# Patient Record
Sex: Male | Born: 1950 | Race: White | Hispanic: No | Marital: Married | Smoking: Former smoker
Health system: Southern US, Community
[De-identification: ages and names within clinical notes are randomized; demographics above are authoritative.]

## PROBLEM LIST (undated history)

## (undated) VITALS — BP 112/78 | HR 68 | Temp 97.9°F | Resp 16 | Ht 65.0 in | Wt 235.0 lb

## (undated) DIAGNOSIS — G43909 Migraine, unspecified, not intractable, without status migrainosus: Secondary | ICD-10-CM

## (undated) DIAGNOSIS — E669 Obesity, unspecified: Secondary | ICD-10-CM

## (undated) DIAGNOSIS — F32A Depression, unspecified: Secondary | ICD-10-CM

## (undated) DIAGNOSIS — M1990 Unspecified osteoarthritis, unspecified site: Secondary | ICD-10-CM

## (undated) DIAGNOSIS — I639 Cerebral infarction, unspecified: Secondary | ICD-10-CM

## (undated) DIAGNOSIS — E1169 Type 2 diabetes mellitus with other specified complication: Secondary | ICD-10-CM

## (undated) DIAGNOSIS — F329 Major depressive disorder, single episode, unspecified: Secondary | ICD-10-CM

## (undated) DIAGNOSIS — G4730 Sleep apnea, unspecified: Secondary | ICD-10-CM

## (undated) DIAGNOSIS — I10 Essential (primary) hypertension: Secondary | ICD-10-CM

## (undated) HISTORY — PX: CHOLECYSTECTOMY: SHX55

## (undated) HISTORY — PX: ROTATOR CUFF REPAIR: SHX139

## (undated) HISTORY — PX: KNEE ARTHROSCOPY: SUR90

## (undated) HISTORY — DX: Cerebral infarction, unspecified: I63.9

---

## 2002-01-29 ENCOUNTER — Encounter: Admission: RE | Admit: 2002-01-29 | Discharge: 2002-01-29 | Payer: Self-pay | Admitting: Occupational Medicine

## 2002-01-29 ENCOUNTER — Encounter: Payer: Self-pay | Admitting: Occupational Medicine

## 2005-02-04 ENCOUNTER — Ambulatory Visit (HOSPITAL_COMMUNITY): Admission: RE | Admit: 2005-02-04 | Discharge: 2005-02-04 | Payer: Self-pay | Admitting: Internal Medicine

## 2006-11-05 ENCOUNTER — Ambulatory Visit (HOSPITAL_COMMUNITY): Admission: RE | Admit: 2006-11-05 | Discharge: 2006-11-05 | Payer: Self-pay | Admitting: Orthopedic Surgery

## 2006-11-20 ENCOUNTER — Ambulatory Visit (HOSPITAL_COMMUNITY): Admission: RE | Admit: 2006-11-20 | Discharge: 2006-11-20 | Payer: Self-pay | Admitting: Orthopedic Surgery

## 2007-01-06 ENCOUNTER — Ambulatory Visit (HOSPITAL_COMMUNITY): Admission: RE | Admit: 2007-01-06 | Discharge: 2007-01-06 | Payer: Self-pay | Admitting: Orthopedic Surgery

## 2008-09-28 ENCOUNTER — Ambulatory Visit (HOSPITAL_COMMUNITY): Admission: RE | Admit: 2008-09-28 | Discharge: 2008-09-28 | Payer: Self-pay | Admitting: Orthopedic Surgery

## 2008-10-19 ENCOUNTER — Ambulatory Visit (HOSPITAL_BASED_OUTPATIENT_CLINIC_OR_DEPARTMENT_OTHER): Admission: RE | Admit: 2008-10-19 | Discharge: 2008-10-19 | Payer: Self-pay | Admitting: Orthopedic Surgery

## 2009-01-10 ENCOUNTER — Encounter: Admission: RE | Admit: 2009-01-10 | Discharge: 2009-03-15 | Payer: Self-pay | Admitting: Orthopedic Surgery

## 2009-02-23 ENCOUNTER — Ambulatory Visit (HOSPITAL_COMMUNITY): Admission: RE | Admit: 2009-02-23 | Discharge: 2009-02-23 | Payer: Self-pay | Admitting: Urology

## 2009-04-22 ENCOUNTER — Emergency Department (HOSPITAL_BASED_OUTPATIENT_CLINIC_OR_DEPARTMENT_OTHER): Admission: EM | Admit: 2009-04-22 | Discharge: 2009-04-22 | Payer: Self-pay | Admitting: Emergency Medicine

## 2009-04-28 ENCOUNTER — Emergency Department (HOSPITAL_BASED_OUTPATIENT_CLINIC_OR_DEPARTMENT_OTHER): Admission: EM | Admit: 2009-04-28 | Discharge: 2009-04-28 | Payer: Self-pay | Admitting: Emergency Medicine

## 2009-04-28 ENCOUNTER — Ambulatory Visit: Payer: Self-pay | Admitting: Diagnostic Radiology

## 2009-12-13 ENCOUNTER — Ambulatory Visit (HOSPITAL_COMMUNITY): Admission: RE | Admit: 2009-12-13 | Discharge: 2009-12-13 | Payer: Self-pay | Admitting: Orthopedic Surgery

## 2009-12-27 ENCOUNTER — Ambulatory Visit (HOSPITAL_BASED_OUTPATIENT_CLINIC_OR_DEPARTMENT_OTHER): Admission: RE | Admit: 2009-12-27 | Discharge: 2009-12-27 | Payer: Self-pay | Admitting: Orthopedic Surgery

## 2010-08-15 ENCOUNTER — Ambulatory Visit (HOSPITAL_COMMUNITY): Admission: RE | Admit: 2010-08-15 | Discharge: 2010-08-15 | Payer: Self-pay | Admitting: Otolaryngology

## 2010-10-18 ENCOUNTER — Inpatient Hospital Stay (HOSPITAL_COMMUNITY)
Admission: EM | Admit: 2010-10-18 | Discharge: 2010-10-20 | Payer: Self-pay | Source: Home / Self Care | Attending: Internal Medicine | Admitting: Internal Medicine

## 2010-10-22 ENCOUNTER — Ambulatory Visit (HOSPITAL_COMMUNITY): Admission: RE | Admit: 2010-10-22 | Payer: Self-pay | Source: Home / Self Care | Admitting: Otolaryngology

## 2010-11-25 ENCOUNTER — Encounter: Payer: Self-pay | Admitting: Otolaryngology

## 2010-11-29 ENCOUNTER — Ambulatory Visit (HOSPITAL_BASED_OUTPATIENT_CLINIC_OR_DEPARTMENT_OTHER)
Admission: RE | Admit: 2010-11-29 | Discharge: 2010-11-29 | Payer: Self-pay | Source: Home / Self Care | Attending: Internal Medicine | Admitting: Internal Medicine

## 2010-12-04 ENCOUNTER — Ambulatory Visit (HOSPITAL_BASED_OUTPATIENT_CLINIC_OR_DEPARTMENT_OTHER): Admission: RE | Admit: 2010-12-04 | Payer: Self-pay | Source: Home / Self Care | Admitting: Internal Medicine

## 2010-12-09 DIAGNOSIS — G4733 Obstructive sleep apnea (adult) (pediatric): Secondary | ICD-10-CM

## 2011-01-14 LAB — URINALYSIS, ROUTINE W REFLEX MICROSCOPIC
Hgb urine dipstick: NEGATIVE
Ketones, ur: NEGATIVE mg/dL
Nitrite: NEGATIVE
Specific Gravity, Urine: 1.029 (ref 1.005–1.030)
Urobilinogen, UA: 2 mg/dL — ABNORMAL HIGH (ref 0.0–1.0)

## 2011-01-14 LAB — CBC
HCT: 43.4 % (ref 39.0–52.0)
MCH: 30.1 pg (ref 26.0–34.0)
MCH: 30.8 pg (ref 26.0–34.0)
MCHC: 34.9 g/dL (ref 30.0–36.0)
MCV: 86.1 fL (ref 78.0–100.0)
MCV: 86.4 fL (ref 78.0–100.0)
RBC: 4.71 MIL/uL (ref 4.22–5.81)
RBC: 5.04 MIL/uL (ref 4.22–5.81)
WBC: 8.5 10*3/uL (ref 4.0–10.5)

## 2011-01-14 LAB — POCT CARDIAC MARKERS
CKMB, poc: <1 ng/mL — ABNORMAL LOW (ref 1.0–8.0)
Myoglobin, poc: 71 ng/mL (ref 12–200)

## 2011-01-14 LAB — DIFFERENTIAL
Basophils Relative: 0 % (ref 0–1)
Eosinophils Absolute: 0.1 10*3/uL (ref 0.0–0.7)
Eosinophils Relative: 1 % (ref 0–5)
Lymphs Abs: 0.9 10*3/uL (ref 0.7–4.0)
Monocytes Absolute: 0.7 10*3/uL (ref 0.1–1.0)
Monocytes Relative: 8 % (ref 3–12)

## 2011-01-14 LAB — LIPID PANEL
Cholesterol: 177 mg/dL (ref 0–200)
HDL: 35 mg/dL — ABNORMAL LOW (ref >39)
LDL Cholesterol: 116 mg/dL — ABNORMAL HIGH (ref 0–99)
VLDL: 26 mg/dL (ref 0–40)

## 2011-01-14 LAB — POCT I-STAT, CHEM 8
Calcium, Ion: 1.04 mmol/L — ABNORMAL LOW (ref 1.12–1.32)
HCT: 44 % (ref 39.0–52.0)
Hemoglobin: 15 g/dL (ref 13.0–17.0)
Potassium: 2.9 mEq/L — ABNORMAL LOW (ref 3.5–5.1)

## 2011-01-14 LAB — COMPREHENSIVE METABOLIC PANEL
Albumin: 3.5 g/dL (ref 3.5–5.2)
Alkaline Phosphatase: 27 U/L — ABNORMAL LOW (ref 39–117)
BUN: 15 mg/dL (ref 6–23)
Creatinine, Ser: 1.18 mg/dL (ref 0.4–1.5)
GFR calc Af Amer: >60 mL/min (ref >60)
Total Protein: 5.8 g/dL — ABNORMAL LOW (ref 6.0–8.3)

## 2011-01-14 LAB — TSH: TSH: 0.431 u[IU]/mL (ref 0.350–4.500)

## 2011-01-14 LAB — CARDIAC PANEL(CRET KIN+CKTOT+MB+TROPI)
Relative Index: INVALID (ref 0.0–2.5)
Troponin I: <0.01 ng/mL (ref 0.00–0.06)

## 2011-01-14 LAB — BASIC METABOLIC PANEL
CO2: 26 mEq/L (ref 19–32)
Chloride: 111 mEq/L (ref 96–112)
GFR calc Af Amer: >60 mL/min (ref >60)
Sodium: 143 mEq/L (ref 135–145)

## 2011-01-23 LAB — POCT I-STAT 4, (NA,K, GLUC, HGB,HCT)
Glucose, Bld: 110 mg/dL — ABNORMAL HIGH (ref 70–99)
Hemoglobin: 14.3 g/dL (ref 13.0–17.0)
Potassium: 3.4 mEq/L — ABNORMAL LOW (ref 3.5–5.1)

## 2011-03-19 NOTE — Op Note (Signed)
NAMEALISTAIR, SENFT                 ACCOUNT NO.:  [id number]   MEDICAL RECORD NO.:  [id number]          PATIENT TYPE:  AMB   LOCATION:  [facility]                         FACILITY:  [facility]   PHYSICIAN:  Ollen Gross, M.D.    DATE OF BIRTH:  1951/06/10   DATE OF PROCEDURE:  10/19/2008  DATE OF DISCHARGE:                               OPERATIVE REPORT   PREOPERATIVE DIAGNOSIS:  Right knee medial meniscal tear.   POSTOPERATIVE DIAGNOSIS:  Right knee medial meniscal tear.   PROCEDURE:  Right knee arthroscopy with meniscal debridement.   SURGEON:  Ollen Gross, M.D.   ASSISTANT:  None.   ANESTHESIA:  General.   ESTIMATED BLOOD LOSS:  Minimal.   DRAINS:  None.   COMPLICATIONS:  None.   CONDITION:  Stable to recovery.   BRIEF CLINICAL NOTE:  Espen is a 60 year old male with a several month  history of significant right knee pain with mechanical symptoms.  Exam  and history suggest a medial meniscal tear which was confirmed by MRI.  He presents now for arthroscopy and debridement.   PROCEDURE IN DETAIL:  After the successful administration of general  anesthetic a tourniquet was placed high on the right thigh and right  lower extremity prepped and draped in the usual sterile fashion.  Standard superomedial and inferolateral incision was made and inflow  cannula passed superomedial and camera passed inferolateral.  Arthroscopic visualization proceeds.  Undersurface of the patella and  trochlea looked normal.  Medial and lateral gutters are visualized and  there were no loose bodies.  Flexion and valgus force was applied to the  knee and the medial compartment was entered.  There was a significant  tear in the body and posterior of the medial meniscus.  The chondral  surfaces looked normal.  Spinal needle was used to localize the  inferomedial portal.  Small incision made and dilator placed.  The  meniscus was debrided back to a stable base with baskets and a 4.2 mm  shaver.  There  was a fair amount of synovitis in the chondral area and  ArthroCare was used to free that.  The ACL appears normal.  Lateral  compartment was entered and it looks normal.  We inspected the rest of  the joint.  There were no other tears, loose bodies or chondral defects.  Arthroscopic equipment was removed from the inferior portals which were  closed  in interrupted 4-0 nylon.  Twenty mL of 0.25% Marcaine with epinephrine  was injected through the inflow cannula and then that was removed and  that portal closed with nylon.  Bulky sterile dressings applied and he  was awakened and transported to recovery in stable condition.      Ollen Gross, M.D.  Electronically Signed     FA/MEDQ  D:  10/19/2008  T:  10/19/2008  Job:  [id number]

## 2011-03-22 NOTE — Op Note (Signed)
NAMERACE, LATOUR                 ACCOUNT NO.:  [id number]   MEDICAL RECORD NO.:  [id number]          PATIENT TYPE:  AMB   LOCATION:  DAY                          FACILITY:  [facility]   PHYSICIAN:  Marlowe Kays, M.D.  DATE OF BIRTH:  June 05, 1951   DATE OF PROCEDURE:  11/20/2006  DATE OF DISCHARGE:                               OPERATIVE REPORT   PREOPERATIVE DIAGNOSES:  1. Degenerative arthritis of acromioclavicular joint.  2. 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  3. 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   POSTOPERATIVE DIAGNOSES:  1. Degenerative arthritis of acromioclavicular joint.  2. Chronic impingement syndrome, no full-thickness rotator cuff      identified.  3. 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   PROCEDURE:  Left shoulder arthroscopy with:  1. Minor labral and rotator cuff debridement.  2. Arthroscopic subacromial and distal clavicle decompression.  3. Exploration of rotator cuff, no full-thickness tear found.   SURGEON:  Marlowe Kays, M.D.   ASSISTANTDruscilla Brownie. Idolina Primer, P.A.-C.   ANESTHESIA:  General.   </   PATHOLOGY AND JUSTIFICATION FOR PROCEDURE:  He had some longstanding  pain in his left shoulder which has become progressively worse, leading  to an MRI on November 05, 2006, which showed the above-mentioned  diagnoses.  He understood ahead of time that we might have to make a  mini-incision if there was a full-thickness rotator cuff tear.   PROCEDURE:  General anesthesia was preceded by interscalene block by  anesthesiologist, prophylactic antibiotics, beach-chair position on the  [place] frame.  Left shoulder girdle was prepped with DuraPrep and draped  into a sterile field.  The anatomy of the shoulder joint was marked out  and posterior soft spot portal, lateral portal and the subacromial space  were all infiltrated with 0.5% Marcaine with adrenaline.  Through a  posterior soft spot  portal, I atraumatically entered the glenohumeral  joint.  There was a little debris in the joint.  There was some minor  labral disruption, but no frank tear.  There also appeared to be some  fraying of the underneath surface of the rotator cuff.  I advanced the  scope between the biceps and subscapularis using a switching stick, made  an anterior incision, placing a metal cannula over the switching stick  and then placing a 4.2 shaver in the joint, debriding down the labrum  and the underneath surface of the rotator cuff, as well as removing some  minor debris in the joint.  I then redirected the scope into the  subacromial space.  He had a flagrant bursitis.  With a 4.2 shaver and  through a lateral portal, I cleaned up most the bursal tissue.  I then  introduced the 90-degree ArthroCare vaporizer, removing soft tissue from  the underneath surface of the acromion and distal clavicle.  The [facility]  joint was quite arthritic on plain x-rays, but he was nontender there  and for this reason, I elected not to do an open distal clavicle  resection.  I then used  a 4-mm oval bur, burring down the underneath  surface of the acromion and distal clavicle and alternated back-and-  forth between the vaporizer, the bur and the 4.2 shaver until we had a  wide decompression as documented with his arm to his side and the arm  abducted with the instrument in place.  I then probe the rotator cuff,  finding at most a small superficial defect, but nothing of any  consequence and certainly nothing that appeared to warrant an open  repair.  I then cleared the joint of all fluid possible.  There was no  bleeding.  The 3 portals and subacromial space were all once again  infiltrated with 0.5% Marcaine with adrenalin and the portals closed  with 4-0 nylon.  Betadine, Adaptic, dry sterile dressing and shoulder  immobilizer were applied.  He tolerated the procedure well and was taken  to the recovery room in stable  condition with no known complications.           ______________________________  Marlowe Kays, M.D.     JA/MEDQ  D:  11/20/2006  T:  11/20/2006  Job:  [id number]

## 2011-08-09 LAB — POCT I-STAT 4, (NA,K, GLUC, HGB,HCT)
HCT: 43 % (ref 39.0–52.0)
Sodium: 139 mEq/L (ref 135–145)

## 2011-12-05 ENCOUNTER — Ambulatory Visit (INDEPENDENT_AMBULATORY_CARE_PROVIDER_SITE_OTHER): Payer: 59 | Admitting: Psychology

## 2011-12-05 DIAGNOSIS — F331 Major depressive disorder, recurrent, moderate: Secondary | ICD-10-CM

## 2011-12-18 ENCOUNTER — Ambulatory Visit (INDEPENDENT_AMBULATORY_CARE_PROVIDER_SITE_OTHER): Payer: 59 | Admitting: Psychology

## 2011-12-18 DIAGNOSIS — F331 Major depressive disorder, recurrent, moderate: Secondary | ICD-10-CM

## 2012-01-02 ENCOUNTER — Ambulatory Visit (INDEPENDENT_AMBULATORY_CARE_PROVIDER_SITE_OTHER): Payer: 59 | Admitting: Psychology

## 2012-01-02 DIAGNOSIS — F331 Major depressive disorder, recurrent, moderate: Secondary | ICD-10-CM

## 2012-01-08 ENCOUNTER — Ambulatory Visit: Payer: 59 | Admitting: Psychology

## 2012-02-09 ENCOUNTER — Ambulatory Visit (INDEPENDENT_AMBULATORY_CARE_PROVIDER_SITE_OTHER): Payer: 59 | Admitting: Family Medicine

## 2012-02-09 VITALS — BP 162/76 | HR 50 | Temp 98.3°F | Resp 18 | Ht 65.0 in | Wt 232.0 lb

## 2012-02-09 DIAGNOSIS — J019 Acute sinusitis, unspecified: Secondary | ICD-10-CM

## 2012-02-09 MED ORDER — AZITHROMYCIN 250 MG PO TABS: ORAL_TABLET | ORAL | Status: AC

## 2012-02-09 NOTE — Progress Notes (Signed)
  Subjective:    Patient ID: Raymond [name], male    DOB: 1951/04/10, 61 y.o.   MRN: [id number]  HPI 61 yo male with sinus complaints. History of migraines, currently undergoing botox therapy for migraines. Friday night started with left sided facial pressure.  Developed nasal drainage and PND.  Slight cough.  No sore throat or fever.  No recent antibiotics.  Uses flonase daily already.    Review of Systems Negative except as per HPI     Objective:   Physical Exam  Constitutional: He appears well-developed. No distress.  HENT:  Right Ear: Tympanic membrane, external ear and ear canal normal. Tympanic membrane is not injected, not scarred, not perforated, not erythematous, not retracted and not bulging.  Left Ear: Tympanic membrane, external ear and ear canal normal. Tympanic membrane is not injected, not scarred, not perforated, not erythematous, not retracted and not bulging.  Nose: Mucosal edema present. No rhinorrhea. Right sinus exhibits no maxillary sinus tenderness and no frontal sinus tenderness. Left sinus exhibits maxillary sinus tenderness. Left sinus exhibits no frontal sinus tenderness.  Mouth/Throat: Uvula is midline, oropharynx is clear and moist and mucous membranes are normal. No oropharyngeal exudate or tonsillar abscesses.  Cardiovascular: Normal rate, regular rhythm, normal heart sounds and intact distal pulses.   No murmur heard. Pulmonary/Chest: Effort normal and breath sounds normal. No respiratory distress. He has no wheezes. He has no rales.  Lymphadenopathy:       Head (right side): No submandibular and no preauricular adenopathy present.       Head (left side): No submandibular and no preauricular adenopathy present.       Right cervical: No superficial cervical and no posterior cervical adenopathy present.      Left cervical: No superficial cervical and no posterior cervical adenopathy present.       Right: No supraclavicular adenopathy present.       Left: No  supraclavicular adenopathy present.  Skin: Skin is warm and dry.          Assessment & Plan:  Sinusitis - zpak.

## 2012-08-31 ENCOUNTER — Other Ambulatory Visit (HOSPITAL_COMMUNITY): Payer: Self-pay | Admitting: Orthopedic Surgery

## 2012-08-31 DIAGNOSIS — M25511 Pain in right shoulder: Secondary | ICD-10-CM

## 2012-09-04 ENCOUNTER — Ambulatory Visit (HOSPITAL_COMMUNITY)
Admission: RE | Admit: 2012-09-04 | Discharge: 2012-09-04 | Disposition: A | Discharge status: Home / Self Care | Payer: 59 | Source: Ambulatory Visit | Attending: Orthopedic Surgery | Admitting: Orthopedic Surgery

## 2012-09-04 DIAGNOSIS — M25511 Pain in right shoulder: Secondary | ICD-10-CM

## 2012-09-04 DIAGNOSIS — M719 Bursopathy, unspecified: Secondary | ICD-10-CM | POA: Insufficient documentation

## 2012-09-04 DIAGNOSIS — M25519 Pain in unspecified shoulder: Secondary | ICD-10-CM | POA: Insufficient documentation

## 2012-09-04 DIAGNOSIS — M67919 Unspecified disorder of synovium and tendon, unspecified shoulder: Secondary | ICD-10-CM | POA: Insufficient documentation

## 2012-10-20 ENCOUNTER — Emergency Department (HOSPITAL_COMMUNITY)
Admission: EM | Admit: 2012-10-20 | Discharge: 2012-10-20 | Disposition: A | Discharge status: Other Acute Inpatient Hospital | Payer: 59 | Attending: Emergency Medicine | Admitting: Emergency Medicine

## 2012-10-20 ENCOUNTER — Inpatient Hospital Stay (HOSPITAL_COMMUNITY)
Admission: RE | Admit: 2012-10-20 | Discharge: 2012-10-27 | DRG: 885 | Disposition: A | Discharge status: Home / Self Care | Payer: 59 | Attending: Psychiatry | Admitting: Psychiatry

## 2012-10-20 ENCOUNTER — Encounter (HOSPITAL_COMMUNITY): Payer: Self-pay | Admitting: *Deleted

## 2012-10-20 DIAGNOSIS — Z888 Allergy status to other drugs, medicaments and biological substances status: Secondary | ICD-10-CM

## 2012-10-20 DIAGNOSIS — Z8679 Personal history of other diseases of the circulatory system: Secondary | ICD-10-CM | POA: Insufficient documentation

## 2012-10-20 DIAGNOSIS — Z79899 Other long term (current) drug therapy: Secondary | ICD-10-CM | POA: Insufficient documentation

## 2012-10-20 DIAGNOSIS — G43909 Migraine, unspecified, not intractable, without status migrainosus: Secondary | ICD-10-CM | POA: Diagnosis present

## 2012-10-20 DIAGNOSIS — G4730 Sleep apnea, unspecified: Secondary | ICD-10-CM | POA: Diagnosis present

## 2012-10-20 DIAGNOSIS — R45851 Suicidal ideations: Secondary | ICD-10-CM | POA: Insufficient documentation

## 2012-10-20 DIAGNOSIS — F3289 Other specified depressive episodes: Secondary | ICD-10-CM | POA: Insufficient documentation

## 2012-10-20 DIAGNOSIS — F329 Major depressive disorder, single episode, unspecified: Secondary | ICD-10-CM | POA: Insufficient documentation

## 2012-10-20 DIAGNOSIS — I10 Essential (primary) hypertension: Secondary | ICD-10-CM | POA: Diagnosis present

## 2012-10-20 DIAGNOSIS — F332 Major depressive disorder, recurrent severe without psychotic features: Principal | ICD-10-CM | POA: Diagnosis present

## 2012-10-20 DIAGNOSIS — M129 Arthropathy, unspecified: Secondary | ICD-10-CM | POA: Diagnosis present

## 2012-10-20 DIAGNOSIS — F339 Major depressive disorder, recurrent, unspecified: Secondary | ICD-10-CM | POA: Diagnosis present

## 2012-10-20 HISTORY — DX: Unspecified osteoarthritis, unspecified site: M19.90

## 2012-10-20 HISTORY — DX: Major depressive disorder, single episode, unspecified: F32.9

## 2012-10-20 HISTORY — DX: Migraine, unspecified, not intractable, without status migrainosus: G43.909

## 2012-10-20 HISTORY — DX: Essential (primary) hypertension: I10

## 2012-10-20 HISTORY — DX: Sleep apnea, unspecified: G47.30

## 2012-10-20 HISTORY — DX: Depression, unspecified: F32.A

## 2012-10-20 LAB — RAPID URINE DRUG SCREEN, HOSP PERFORMED
Amphetamines: NOT DETECTED
Barbiturates: NOT DETECTED
Benzodiazepines: NOT DETECTED
Tetrahydrocannabinol: NOT DETECTED

## 2012-10-20 LAB — CBC WITH DIFFERENTIAL/PLATELET
Basophils Absolute: 0.1 10*3/uL (ref 0.0–0.1)
Basophils Relative: 1 % (ref 0–1)
Eosinophils Absolute: 0.2 10*3/uL (ref 0.0–0.7)
Eosinophils Relative: 1 % (ref 0–5)
HCT: 47.8 % (ref 39.0–52.0)
MCH: 30.7 pg (ref 26.0–34.0)
MCHC: 36 g/dL (ref 30.0–36.0)
MCV: 85.4 fL (ref 78.0–100.0)
Monocytes Absolute: 0.8 10*3/uL (ref 0.1–1.0)
RDW: 13 % (ref 11.5–15.5)

## 2012-10-20 LAB — COMPREHENSIVE METABOLIC PANEL
ALT: 17 U/L (ref 0–53)
Albumin: 4.2 g/dL (ref 3.5–5.2)
Alkaline Phosphatase: 38 U/L — ABNORMAL LOW (ref 39–117)
Chloride: 104 mEq/L (ref 96–112)
Glucose, Bld: 98 mg/dL (ref 70–99)
Potassium: 3.6 mEq/L (ref 3.5–5.1)
Sodium: 139 mEq/L (ref 135–145)
Total Protein: 7.2 g/dL (ref 6.0–8.3)

## 2012-10-20 MED ORDER — HYDROXYZINE HCL 50 MG PO TABS
50.0000 mg | ORAL_TABLET | Freq: Every evening | ORAL | Status: DC | PRN
  Administered 2012-10-21 – 2012-10-26 (×7): 50 mg via ORAL
  Filled 2012-10-20 (×18): qty 1

## 2012-10-20 MED ORDER — NAPROXEN 500 MG PO TABS
500.0000 mg | ORAL_TABLET | Freq: Three times a day (TID) | ORAL | Status: DC
  Filled 2012-10-20 (×2): qty 1

## 2012-10-20 MED ORDER — NAPROXEN 500 MG PO TABS
500.0000 mg | ORAL_TABLET | Freq: Three times a day (TID) | ORAL | Status: DC | PRN
  Administered 2012-10-21 (×2): 500 mg via ORAL
  Filled 2012-10-20: qty 1
  Filled 2012-10-20: qty 12

## 2012-10-20 MED ORDER — VERAPAMIL HCL 120 MG PO TABS
120.0000 mg | ORAL_TABLET | Freq: Every day | ORAL | Status: DC
  Administered 2012-10-21 – 2012-10-27 (×7): 120 mg via ORAL
  Filled 2012-10-20 (×9): qty 1

## 2012-10-20 MED ORDER — ALUM & MAG HYDROXIDE-SIMETH 200-200-20 MG/5ML PO SUSP: 30.0000 mL | ORAL | Status: DC | PRN

## 2012-10-20 MED ORDER — AMOXICILLIN 500 MG PO CAPS
500.0000 mg | ORAL_CAPSULE | Freq: Two times a day (BID) | ORAL | Status: AC
  Administered 2012-10-21 – 2012-10-26 (×12): 500 mg via ORAL
  Filled 2012-10-20 (×13): qty 1
  Filled 2012-10-20: qty 2

## 2012-10-20 MED ORDER — CITALOPRAM HYDROBROMIDE 20 MG PO TABS
20.0000 mg | ORAL_TABLET | Freq: Every day | ORAL | Status: DC
  Administered 2012-10-21: 20 mg via ORAL
  Filled 2012-10-20 (×3): qty 1

## 2012-10-20 MED ORDER — LOSARTAN POTASSIUM 50 MG PO TABS
120.0000 mg | ORAL_TABLET | Freq: Every day | ORAL | Status: DC
  Administered 2012-10-21 – 2012-10-27 (×7): 125 mg via ORAL
  Filled 2012-10-20 (×10): qty 1

## 2012-10-20 MED ORDER — TOPIRAMATE 100 MG PO TABS
100.0000 mg | ORAL_TABLET | Freq: Every day | ORAL | Status: DC
Start: 2012-10-21 — End: 2012-10-27
  Administered 2012-10-21 – 2012-10-26 (×7): 100 mg via ORAL
  Filled 2012-10-20 (×10): qty 1

## 2012-10-20 MED ORDER — ACETAMINOPHEN 325 MG PO TABS: 650.0000 mg | ORAL_TABLET | Freq: Four times a day (QID) | ORAL | Status: DC | PRN

## 2012-10-20 MED ORDER — MAGNESIUM HYDROXIDE 400 MG/5ML PO SUSP: 30.0000 mL | Freq: Every day | ORAL | Status: DC | PRN

## 2012-10-20 MED ORDER — LURASIDONE HCL 20 MG PO TABS
20.0000 mg | ORAL_TABLET | Freq: Every day | ORAL | Status: DC
  Filled 2012-10-20 (×2): qty 1

## 2012-10-20 MED ORDER — DULOXETINE HCL 60 MG PO CPEP
60.0000 mg | ORAL_CAPSULE | Freq: Two times a day (BID) | ORAL | Status: DC
  Administered 2012-10-21: 60 mg via ORAL
  Filled 2012-10-20 (×4): qty 1

## 2012-10-20 NOTE — ED Provider Notes (Signed)
Medical screening examination/treatment/procedure(s) were performed by non-physician practitioner and as supervising physician I was immediately available for consultation/collaboration.  Gerhard Munch, MD 10/20/12 2351

## 2012-10-20 NOTE — ED Notes (Signed)
Per [employer] pt ok to return to [facility]; accepted by Donell Sievert PA for Dr Daleen Bo; report called to Sue Lush on [phone]; Security to transport patient with sitter

## 2012-10-20 NOTE — ED Provider Notes (Signed)
History     CSN: [id number]  Arrival date & time 10/20/12  2055   First MD Initiated Contact with Patient 10/20/12 2109      Chief Complaint  Patient presents with  . Medical Clearance    (Consider location/radiation/quality/duration/timing/severity/associated sxs/prior treatment) HPI Comments: 61 year old male presents to the emergency department with his wife from behavioral health needing medical clearance. Patient has a long history of depression and went to check himself at behavioral health who told him to come get medically cleared prior to being admitted. Patient's depression has been worsening over the past couple weeks. He denies suicidal ideations, however his wife states otherwise. She states he has tried to kill himself in the past and feels he is acting the same. Patient is currently on antibiotics for a sinus infection. He is beginning to feel better. He has a history of hypertension and migraines. Other than depression patient is not complaining of any other symptoms at this time.  The history is provided by the patient and the spouse.    Past Medical History  Diagnosis Date  . Hypertension   . Migraines     Past Surgical History  Procedure Date  . Knee arthroscopy   . Rotator cuff repair     No family history on file.  History  Substance Use Topics  . Smoking status: Never Smoker   . Smokeless tobacco: Not on file  . Alcohol Use: No      Review of Systems  Psychiatric/Behavioral: Positive for suicidal ideas. The patient is nervous/anxious.   All other systems reviewed and are negative.    Allergies  Flomax and Lopressor  Home Medications   Current Outpatient Rx  Name  Route  Sig  Dispense  Refill  . CITALOPRAM HYDROBROMIDE 20 MG PO TABS   Oral   Take 20 mg by mouth daily.         . DULOXETINE HCL 60 MG PO CPEP   Oral   Take 60 mg by mouth daily.         . IBUPROFEN 200 MG PO TABS   Oral   Take 200 mg by mouth every 6 (six) hours  as needed. Pain         . LOSARTAN POTASSIUM 100 MG PO TABS   Oral   Take 120 mg by mouth daily.         Marland Kitchen LURASIDONE HCL 20 MG PO TABS   Oral   Take by mouth.         . TOPIRAMATE 100 MG PO TABS   Oral   Take 100 mg by mouth 2 (two) times daily.          Marland Kitchen VERAPAMIL HCL 120 MG PO TABS   Oral   Take 120 mg by mouth 1 day or 1 dose.           There were no vitals taken for this visit.  Physical Exam  Constitutional: He is oriented to person, place, and time. He appears well-developed. No distress.       Obese  HENT:  Head: Normocephalic and atraumatic.  Mouth/Throat: Oropharynx is clear and moist.  Eyes: Conjunctivae normal and EOM are normal. Pupils are equal, round, and reactive to light.  Neck: Normal range of motion. Neck supple.  Cardiovascular: Normal rate, regular rhythm and normal heart sounds.   Pulmonary/Chest: Effort normal and breath sounds normal.  Abdominal: Soft. Bowel sounds are normal. There is no tenderness.  Musculoskeletal: Normal range  of motion.  Neurological: He is alert and oriented to person, place, and time.  Skin: Skin is warm and dry.  Psychiatric: His speech is normal and behavior is normal. Judgment normal. Cognition and memory are normal. He exhibits a depressed mood. He expresses no homicidal and no suicidal ideation. He expresses no suicidal plans.    ED Course  Procedures (including critical care time)  Labs Reviewed  CBC WITH DIFFERENTIAL - Abnormal; Notable for the following:    WBC 11.5 (*)     Hemoglobin 17.2 (*)     Neutro Abs 8.4 (*)     All other components within normal limits  COMPREHENSIVE METABOLIC PANEL - Abnormal; Notable for the following:    Alkaline Phosphatase 38 (*)     GFR calc non Af Amer 61 (*)     GFR calc Af Amer 70 (*)     All other components within normal limits  ETHANOL  URINE RAPID DRUG SCREEN (HOSP PERFORMED)   No results found.   1. Depression   2. Suicidal ideation       MDM  61  y/o male needing medical clearance before being transferred back to [facility]. Patient is medically cleared. Accepting doctor at [facility] is Dr. Daleen Bo. Patient will be seen by his PA Marland Mcalpine.         Trevor Mace, PA-C 10/20/12 2308

## 2012-10-20 NOTE — ED Notes (Addendum)
Pt sent from [facility] for medical clearance; has been accepted by psychiatrist at [facility]; medical clearance for "clinical depression"; made statement he would be better off dead; sleeping all day; poor appetite; previous history of intentional overdose; family at bedside

## 2012-10-20 NOTE — BH Assessment (Signed)
Assessment Note   Raymond [name] is an 61 y.o. male. Pt presents to [facility] for an evaluation. Pt reports "I am clinically depressed". Pt reports that he has been severely depressed over the past 2-3 days. Pt reports that he retired in 07/2012 and is currently very unhappy with his life. Pt reports decompensation behaviors to include sleeping all day, pt reports that his daily routine is getting out of bed, taking medications, and sleeping for the remainder of the day. Pt reports feeling that he will never be happy. Pt reports feeling hopeless and lacks motivation to do anything. Pt reports poor appetite. Pt presents flat and depressed. Pt reports family conflict in which he could not further specify details. Pt reports that his grandchildren keep him going and states that he wants to be happy for them.   Per wife's collateral feedback, she reports that she is very concerned about patient and feels that he will harm himself due to his current level of depression and previous overdose/suicide attempt in the past. Pt's wife reports that pt made a statement today stating that  life would be better off without him. Wife reports that pt has shut down and is not talking to anyone and does not want to be around anyone and wants to be alone all the time. Pt reports decline in grooming and hygiene. Pt reports that he feels more depressed d/t having migraine headaches everyday. Pt reports that he receives care from a Neurologist and is taking medication for his migraine headaches. Pt reports he is diagnosed with HTN and Sleep Apnea. Pt is obese and reports that he has an unhealthy diet, stating he eats "junk" all the time. Pt unable to function despite outpatient medication management services, pt's wife reports that pt is not consistent and compliant with maintaining outpatient appointments. Pt denies HI and no AVH and is unable to reliably contract for safety at this time. Inpatient treatment recommended for safety and  stabilization.  Consulted with AC Brook McNichol and [employer] who agreed to admit pt to [facility] for inpatient admission for stabilization, medication evaluation,and safety once he is medically cleared at [facility].   Spoke with Charge Nurse Dorathy Daft at [facility] to inform of pt transfer for medical clearance.  Axis I: Major Depression, Recurrent severe Axis II: Deferred Axis III: No past medical history on file. Axis IV: other psychosocial or environmental problems, problems related to social environment and problems with primary support group Axis V: 31-40 impairment in reality testing  Past Medical History: No past medical history on file.  No past surgical history on file.  Family History: No family history on file.  Social History:  reports that he has never smoked. He does not have any smokeless tobacco history on file. He reports that he does not drink alcohol. His drug history not on file.  Additional Social History:  Alcohol / Drug Use Pain Medications:  (None Reported) Prescriptions:  (Yes) Over the Counter:  (OTC Medication for Migraine Headaches) History of alcohol / drug use?: No history of alcohol / drug abuse  CIWA:   COWS:    Allergies:  Allergies  Allergen Reactions  . Lopressor (Metoprolol Tartrate)     Elevated blood pressure    Home Medications:  (Not in a hospital admission)  OB/GYN Status:  No LMP for male patient.  General Assessment Data Location of Assessment: [facility] Assessment Services Living Arrangements: Spouse/significant other (Lives with Spouse and mother-in-law) Can pt return to current living arrangement?:  Yes Admission Status: Voluntary Is patient capable of signing voluntary admission?: Yes Transfer from: Home Referral Source: Self/Family/Friend     Risk to self Suicidal Ideation: No Suicidal Intent: No (per wife,pt stated he would be better off dead today) Is patient at risk for suicide?: Yes Suicidal Plan?: No Access to Means:  No What has been your use of drugs/alcohol within the last 12 months?: none reported Previous Attempts/Gestures: Yes How many times?: 1  Other Self Harm Risks: none reported Triggers for Past Attempts: Family contact;Other (Comment) (unhappy with employer) Intentional Self Injurious Behavior: None Family Suicide History: No Recent stressful life event(s): Conflict (Comment) Persecutory voices/beliefs?: No Depression: Yes Depression Symptoms: Despondent;Insomnia;Tearfulness;Isolating;Fatigue;Loss of interest in usual pleasures;Feeling worthless/self pity Substance abuse history and/or treatment for substance abuse?: No Suicide prevention information given to non-admitted patients: Not applicable  Risk to Others Homicidal Ideation: No Thoughts of Harm to Others: No Current Homicidal Intent: No Current Homicidal Plan: No Access to Homicidal Means: No Identified Victim: na History of harm to others?: No Assessment of Violence: None Noted Violent Behavior Description: No Hx of violence, Calm, Cooperative,flat,depressed Does patient have access to weapons?: No Criminal Charges Pending?: No Does patient have a court date: No  Psychosis Hallucinations: None noted Delusions: None noted  Mental Status Report Appear/Hygiene: Other (Comment) (Appropriate) Eye Contact: Poor Motor Activity: Restlessness Speech: Logical/coherent Level of Consciousness: Alert Mood: Depressed;Ambivalent;Despair;Helpless;Worthless, low self-esteem Affect: Appropriate to circumstance;Depressed Anxiety Level: Minimal Thought Processes: Coherent;Relevant Judgement: Unimpaired Orientation: Person;Place;Time;Situation Obsessive Compulsive Thoughts/Behaviors: None  Cognitive Functioning Concentration: Normal Memory: Recent Intact;Remote Intact IQ: Average Insight: Poor Impulse Control: Fair Appetite: Poor Weight Loss: 0  Weight Gain: 0  Sleep: Increased Total Hours of Sleep:  (pt reports that he  sleeps at least 20 hours a day) Vegetative Symptoms: Staying in bed;Not bathing;Decreased grooming  ADLScreening [facility] Assessment Services) Patient's cognitive ability adequate to safely complete daily activities?: Yes Patient able to express need for assistance with ADLs?: Yes Independently performs ADLs?: Yes (appropriate for developmental age)  Abuse/Neglect [facility]) Physical Abuse: Denies Verbal Abuse: Denies Sexual Abuse: Denies  Prior Inpatient Therapy Prior Inpatient Therapy: Yes Prior Therapy Dates: 2011? Prior Therapy Facilty/Provider(s): [facility] Reason for Treatment: Intentional Overdose, Suicide Attempt  Prior Outpatient Therapy Prior Outpatient Therapy: Yes Prior Therapy Dates: 2013 Prior Therapy Facilty/Provider(s): Does not remember his psychiatrist's name, reports that she is in [place], pt sts he last saw her 2 months ago Reason for Treatment: Medication Management/Depression (Medication Management/Depression)  ADL Screening (condition at time of admission) Patient's cognitive ability adequate to safely complete daily activities?: Yes Patient able to express need for assistance with ADLs?: Yes Independently performs ADLs?: Yes (appropriate for developmental age) Weakness of Legs: None Weakness of Arms/Hands: None       Abuse/Neglect Assessment (Assessment to be complete while patient is alone) Physical Abuse: Denies Verbal Abuse: Denies Sexual Abuse: Denies Self-Neglect: Yes, present (Comment) (reports decrease in Hygiene and Grooming)     Advance Directives (For Healthcare) Advance Directive: Patient does not have advance directive;Patient would not like information Nutrition Screen- [facility] Adult/WL/AP Have you recently lost weight without trying?: No Have you been eating poorly because of a decreased appetite?: Yes Malnutrition Screening Tool Score: 1   Additional Information 1:1 In Past 12 Months?: No CIRT Risk: No Elopement Risk:  No Does patient have medical clearance?: No     Disposition:  Disposition Disposition of Patient: Referred to (Accepted to [facility],pending med clearance at [facility]) Patient referred to:  (Pt referred  to [facility] for med clearance)  On Site Evaluation by:   Reviewed with Physician:     Bjorn Pippin 10/20/2012 8:19 PM

## 2012-10-20 NOTE — ED Notes (Signed)
Pt transported via security with sitter to [facility]

## 2012-10-21 ENCOUNTER — Encounter (HOSPITAL_COMMUNITY): Payer: Self-pay | Admitting: Psychiatry

## 2012-10-21 DIAGNOSIS — F339 Major depressive disorder, recurrent, unspecified: Secondary | ICD-10-CM | POA: Diagnosis present

## 2012-10-21 LAB — LIPID PANEL
LDL Cholesterol: 131 mg/dL — ABNORMAL HIGH (ref 0–99)
VLDL: 31 mg/dL (ref 0–40)

## 2012-10-21 LAB — HEPATIC FUNCTION PANEL
AST: 19 U/L (ref 0–37)
Albumin: 4.1 g/dL (ref 3.5–5.2)
Bilirubin, Direct: 0.1 mg/dL (ref 0.0–0.3)
Total Protein: 6.6 g/dL (ref 6.0–8.3)

## 2012-10-21 LAB — TSH: TSH: 0.635 u[IU]/mL (ref 0.350–4.500)

## 2012-10-21 MED ORDER — HYDROCHLOROTHIAZIDE 12.5 MG PO CAPS
12.5000 mg | ORAL_CAPSULE | Freq: Every day | ORAL | Status: DC
  Administered 2012-10-21 – 2012-10-27 (×7): 12.5 mg via ORAL
  Filled 2012-10-21 (×9): qty 1

## 2012-10-21 MED ORDER — CITALOPRAM HYDROBROMIDE 40 MG PO TABS
40.0000 mg | ORAL_TABLET | Freq: Every day | ORAL | Status: DC
  Administered 2012-10-22 – 2012-10-27 (×6): 40 mg via ORAL
  Filled 2012-10-21 (×8): qty 1

## 2012-10-21 MED ORDER — DULOXETINE HCL 60 MG PO CPEP
60.0000 mg | ORAL_CAPSULE | Freq: Every day | ORAL | Status: DC
  Administered 2012-10-22 – 2012-10-23 (×2): 60 mg via ORAL
  Filled 2012-10-21 (×3): qty 1

## 2012-10-21 NOTE — Progress Notes (Signed)
Pt reports he does not feel any different than when he was admitted last night.  He still looks flat/sad/sullen.  Earlier shift RN reported that pt has been isolating him self today.  He has been encouraged to be up/out of his bed.  He has not attended any groups today.  He is still c/o a headache.  He was given Naproxen 500 mg at this time.  He contracts for safety.  He denies HI/AV.  He voices no needs/concerns at this time.  Pt was encouraged to make his needs known to staff.  Safety maintained with q15 minute checks.

## 2012-10-21 NOTE — Progress Notes (Signed)
[facility] Group Notes:  (Counselor/Nursing/MHT/Case Management/Adjunct)  Type of Therapy:  Psychoeducational Skills  Participation Level:  Did Not Attend  Summary of Progress/Problems: Raymond [name] did not attend psychoeducational group that focused on using quality time with support systems/individuals to engage in health coping skills.   Wandra Scot 10/21/2012 2:36 PM

## 2012-10-21 NOTE — H&P (Signed)
Patient seen and assessed. Agree with above assessment. Discussed with patient simplifying his treatment regimen since he is not benefiting from it currently, he reports being okay with any plan. Will taper Cymbalta, discontinue Latuda and monitor.

## 2012-10-21 NOTE — [facility] Suicide Risk Assessment (Signed)
Suicide Risk Assessment  Admission Assessment     Nursing information obtained from:  Patient Demographic factors:  Male;Caucasian;Unemployed Current Mental Status:  Suicidal ideation indicated by patient;Self-harm thoughts Loss Factors:  Decrease in vocational status;Financial problems / change in socioeconomic status Historical Factors:  Prior suicide attempts;Family history of mental illness or substance abuse Risk Reduction Factors:     CLINICAL FACTORS:   Depression:   Anhedonia Hopelessness Insomnia Severe  COGNITIVE FEATURES THAT CONTRIBUTE TO RISK:  Thought constriction (tunnel vision)    SUICIDE RISK:   Mild:  Suicidal ideation of limited frequency, intensity, duration, and specificity.  There are no identifiable plans, no associated intent, mild dysphoria and related symptoms, good self-control (both objective and subjective assessment), few other risk factors, and identifiable protective factors, including available and accessible social support.  PLAN OF CARE: Initiate medications as appropriate. Encourage patient to attend groups.   Raymond [name] 10/21/2012, 12:03 PM

## 2012-10-21 NOTE — Progress Notes (Signed)
D: Patient cooperative with staff. Patient's affect/mood is irritable and depressed. Declined self inventory sheet. Patient did not attend group.  A: Support and encouragement provided to patient. Administered scheduled medications per MD orders. Monitor Q15 minute checks for safety.  R: Patient compliant with medication regimen. Passive SI but contracts for safety. Denies HI and AVH.

## 2012-10-21 NOTE — Progress Notes (Signed)
Pt reported he has been having daily migraines.

## 2012-10-21 NOTE — Progress Notes (Signed)
Vol admit to the 500 hall presented as a walk-in and was sent for medical clearance.  Pt reports being severely depressed over the past 3-4 days.  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  He says he is very unhappy with life and would just like to go to sleep and not wake up or be locked away from his family.  He feels they would be better off without him.  He states the only thing that keeps him from committing suicide is his 3 grandsons.  He is in constant conflict with his daughter and wife.  He says he sleeps all day, only getting up for his medications.  He tells this Clinical research associate that he does not see any way of him ever being happy again.  He feels hopeless/helpless.  He was able to contract for safety.  He denies HI/AV.  He also spent quite a bit of time venting his frustration about the process for admission and how long it took for him to get to this point.  Pt would vent his frustrations, then apologize for sounding hostile.  Pt reports he has sleep apnea and has a CPAP at home, but it does not work correctly.  He also has HTN and currently is being treated for a sinus infection.  He was cooperative with this Clinical research associate.  Pt was encouraged to make his needs known to staff.  Admission was completed and pt was briefly oriented to the unit/room.  Safety checks q15 minutes initiated.

## 2012-10-21 NOTE — H&P (Signed)
Psychiatric Admission Assessment Adult  Patient Identification:  MANUAL NAVARRA Date of Evaluation:  10/21/2012 Chief Complaint:  MDD History of Present Illness:: Depression for about the past two years, severe in the past few months, retired in September but does not relate it to retirement, depression is continuous, aggravating factors are his family (spouse and daughters), nothing is making it better, Daryle would like to "just go to a corner, lie down, and hide."  He gets upset because his family will not allow him to do this and he acts out angrily.  Randie feels no medicine will make his problems change, "I don't care what you do to me, I really don't."  Migraine headaches stay at a 5/10 on botox for these.  Elements:  Location:  N/A, generalized. Quality:  Severe. Severity:  10/10. Timing:  Consistently "bad all the time". Duration:  2 years, worse over the past few months. Context:  Any time, all the time. Associated Signs/Synptoms: Depression Symptoms:  depressed mood, hypersomnia, fatigue, difficulty concentrating, suicidal thoughts without plan, loss of energy/fatigue, disturbed sleep, decreased appetite, (Hypo) Manic Symptoms:  None Anxiety Symptoms:  None Psychotic Symptoms:  Ringing in the ears, humming noise--constant PTSD Symptoms:  None  Psychiatric Specialty Exam: Physical Exam  Review of Systems  Constitutional: Negative.   Eyes: Negative.   Respiratory: Negative.   Cardiovascular: Negative.   Gastrointestinal: Negative.   Genitourinary: Negative.   Musculoskeletal:       Right shoulder issues  Skin: Negative.   Neurological: Positive for headaches.  Endo/Heme/Allergies: Negative.   Psychiatric/Behavioral: Positive for depression and suicidal ideas.    Blood pressure 143/96, pulse 86, temperature 98.3 F (36.8 C), temperature source Oral, resp. rate 16, height 5\' 5"  (1.651 m), weight 106.595 kg (235 lb).Body mass index is 39.11 kg/(m^2).  General  Appearance: Casual  Eye Contact::  Fair  Speech:  Slow  Volume:  Normal  Mood:  Depressed, Hopeless, Irritable and Worthless  Affect:  Depressed  Thought Process:  Coherent  Orientation:  Full (Time, Place, and Person)  Thought Content:  WDL  Suicidal Thoughts:  No  Homicidal Thoughts:  No  Memory:  Immediate;   Fair Recent;   Fair Remote;   Fair  Judgement:  Fair  Insight:  Fair  Psychomotor Activity:  Decreased  Concentration:  Fair  Recall:  Fair  Akathisia:  No  Handed:  Right  AIMS (if indicated):     Assets:  Communication Skills Social Support  Sleep:  Number of Hours: 5.25     Past Psychiatric History: Diagnosis:  Depression  Hospitalizations:  [facility]  Outpatient Care:  Therapist, psychiatrist  Substance Abuse Care:  Self-Mutilation:  Suicidal Attempts:  Once  Violent Behaviors:     Past Medical History:   Past Medical History  Diagnosis Date  . Hypertension   . Migraines   . Sleep apnea   . Depression   . Arthritis    None. Allergies:   Allergies  Allergen Reactions  . Flomax (Tamsulosin Hcl)     Effects bp   . Lopressor (Metoprolol Tartrate)     Elevated blood pressure   PTA Medications: Prescriptions prior to admission  Medication Sig Dispense Refill  . citalopram (CELEXA) 20 MG tablet Take 20 mg by mouth daily.      . DULoxetine (CYMBALTA) 60 MG capsule Take 60 mg by mouth 2 (two) times daily.       Marland Kitchen ibuprofen (ADVIL,MOTRIN) 200 MG tablet Take 200 mg by mouth  every 6 (six) hours as needed. Pain      . losartan (COZAAR) 100 MG tablet Take 120 mg by mouth daily.      . Lurasidone HCl (LATUDA) 20 MG TABS Take 20 mg by mouth at bedtime.       . topiramate (TOPAMAX) 100 MG tablet Take 100 mg by mouth at bedtime.       . verapamil (CALAN) 120 MG tablet Take 120 mg by mouth daily.        Previous Psychotropic Medications:  Medication/Dose   See MAR               Substance Abuse History in the last 12 months:   no  Consequences of Substance Abuse: NA  Social History:  reports that he has been smoking Cigars.  He does not have any smokeless tobacco history on file. He reports that he does not drink alcohol or use illicit drugs. Additional Social History: Pain Medications: see med list Prescriptions: see med list Over the Counter: see med list History of alcohol / drug use?: No history of alcohol / drug abuse   Current Place of Residence:  [place], [place] Place of Birth:   Family Members:  Wife and two daughters Marital Status:  Married Children:  Sons:  Daughters:  2 Relationships:  Wife Education:  Corporate treasurer Problems/Performance:  None Religious Beliefs/Practices: History of Abuse (Emotional/Physical/Sexual):  None Occupational Experiences;  OR Nurse, retired Hotel manager History:  Data processing manager History:  None Hobbies/Interests:  Nothing at this time brings him pleasure  Family History:  History reviewed. No pertinent family history.  Results for orders placed during the hospital encounter of 10/20/12 (from the past 72 hour(s))  HEPATIC FUNCTION PANEL     Status: Abnormal   Collection Time   10/21/12  6:35 AM      Component Value Range Comment   Total Protein 6.6  6.0 - 8.3 g/dL    Albumin 4.1  3.5 - 5.2 g/dL    AST 19  0 - 37 U/L    ALT 16  0 - 53 U/L    Alkaline Phosphatase 35 (*) 39 - 117 U/L    Total Bilirubin 0.6  0.3 - 1.2 mg/dL    Bilirubin, Direct 0.1  0.0 - 0.3 mg/dL    Indirect Bilirubin 0.5  0.3 - 0.9 mg/dL    Psychological Evaluations:  Assessment:   AXIS I:  Major Depression, Recurrent severe AXIS II:  Deferred AXIS III:   Past Medical History  Diagnosis Date  . Hypertension   . Migraines   . Sleep apnea   . Depression   . Arthritis    AXIS IV:  other psychosocial or environmental problems, problems related to social environment and problems with primary support group AXIS V:  31-40 impairment in reality testing  Treatment Plan/Recommendations:    1-Crisis management and stabilization 2-Medication management to reduce depressive symptoms, return to baseline, improve patient's overall functioning 3-Address hypertension and other physical issues 4-Develop a treatment plan to reduce relapse after discharge 5-Restart home medications as appropriate 6-Psycho-social education regarding relapsing prevention and self-care 7-Individual and group therapy to develop coping skills for depression and family issues  Treatment Plan Summary: Daily contact with patient to assess and evaluate symptoms and progress in treatment Medication management Current Medications:  Current Facility-Administered Medications  Medication Dose Route Frequency Provider Last Rate Last Dose  . acetaminophen (TYLENOL) tablet 650 mg  650 mg Oral Q6H PRN Kerry Hough, PA      .  alum & mag hydroxide-simeth (MAALOX/MYLANTA) 200-200-20 MG/5ML suspension 30 mL  30 mL Oral Q4H PRN Kerry Hough, PA      . amoxicillin (AMOXIL) capsule 500 mg  500 mg Oral Q12H Kerry Hough, PA   500 mg at 10/21/12 1610  . citalopram (CELEXA) tablet 20 mg  20 mg Oral Daily Kerry Hough, PA   20 mg at 10/21/12 9604  . DULoxetine (CYMBALTA) DR capsule 60 mg  60 mg Oral BID Kerry Hough, PA   60 mg at 10/21/12 5409  . hydrochlorothiazide (MICROZIDE) capsule 12.5 mg  12.5 mg Oral Daily Nanine Means, NP      . hydrOXYzine (ATARAX/VISTARIL) tablet 50 mg  50 mg Oral QHS,MR X 1 Kerry Hough, PA   50 mg at 10/21/12 0012  . losartan (COZAAR) tablet 125 mg  125 mg Oral Daily Kerry Hough, PA   125 mg at 10/21/12 0831  . Lurasidone HCl TABS 20 mg  20 mg Oral QHS Kerry Hough, PA      . magnesium hydroxide (MILK OF MAGNESIA) suspension 30 mL  30 mL Oral Daily PRN Kerry Hough, PA      . naproxen (NAPROSYN) tablet 500 mg  500 mg Oral TID PRN Kerry Hough, PA   500 mg at 10/21/12 0012  . topiramate (TOPAMAX) tablet 100 mg  100 mg Oral QHS Kerry Hough, PA   100 mg at 10/21/12  0012  . verapamil (CALAN) tablet 120 mg  120 mg Oral Daily Kerry Hough, PA   120 mg at 10/21/12 8119    Observation Level/Precautions:  15 minute checks  Laboratory:  Completed and reviewed, stable  Psychotherapy:  Individual and group  Medications:  See MAR  Consultations:  None  Discharge Concerns:  None  Estimated LOS:  5-7 days  Other:     I certify that inpatient services furnished can reasonably be expected to improve the patient's condition.   Nanine Means, PMH-NP 12/18/20139:03 AM

## 2012-10-21 NOTE — Clinical Social Work Note (Signed)
[facility] LCSW Aftercare Discharge Planning Group Note       8:30-9:30 AM   10/21/2012 8:21 AM  Participation Quality:  Appropriate  Affect:  Appropriate  Cognitive:  Appropriate  Insight:  Engaged  Engagement in Group:  Engaged  Modes of Intervention:  Education, Exploration, Dentist and Support  Summary of Progress/Problems:  Raymond [name] 10/21/2012, 8:21 AM \

## 2012-10-21 NOTE — Tx Team (Signed)
Initial Interdisciplinary Treatment Plan  PATIENT STRENGTHS: (choose at least two) Ability for insight Average or above average intelligence Capable of independent living General fund of knowledge Work skills  PATIENT STRESSORS: Financial difficulties Marital or family conflict   PROBLEM LIST: Problem List/Patient Goals Date to be addressed Date deferred Reason deferred Estimated date of resolution  Depression- "I want help with my depression"      Risk for self harm-"I just want to sleep and not wake up"      Family/Marital  conflict                                           DISCHARGE CRITERIA:  Ability to meet basic life and health needs Improved stabilization in mood, thinking, and/or behavior Motivation to continue treatment in a less acute level of care Reduction of life-threatening or endangering symptoms to within safe limits Safe-care adequate arrangements made Verbal commitment to aftercare and medication compliance  PRELIMINARY DISCHARGE PLAN: Attend aftercare/continuing care group Attend PHP/IOP Participate in family therapy Placement in alternative living arrangements  PATIENT/FAMIILY INVOLVEMENT: This treatment plan has been presented to and reviewed with the patient, Raymond [name], and/or family member.  The patient and family have been given the opportunity to ask questions and make suggestions.  Jesus Genera [facility] 10/21/2012, 12:42 AM

## 2012-10-21 NOTE — Clinical Social Work Note (Signed)
Interdisciplinary Treatment Plan Update (Adult)  Date:  10/21/2012  Time Reviewed:  8:30 AM   Progress in Treatment: Attending groups:   Yes   Participating in groups:  Yes Taking medication as prescribed:  Yes Tolerating medication:  Yes Family/Significant othe contact made: Contact made with family Patient understands diagnosis:  Yes Discussing patient identified problems/goals with staff: Yes Medical problems stabilized or resolved: Yes Denies suicidal/homicidal ideation:Yes Issues/concerns per patient self-inventory:  Other:   New problem(s) identified:  Reason for Continuation of Hospitalization: Anxiety Depression Medication stabilization Suicidal ideation  Interventions implemented related to continuation of hospitalization:  Medication Management; safety checks q 15 mins  Additional comments:  Estimated length of stay:  2-3 days  Discharge Plan:  Home with outpatient follow up  New goal(s):  Review of initial/current patient goals per problem list:    1.  Goal(s): Eliminate SI/other thoughts of self harm   Met:  Yes  Target date: d/c  As evidenced by: Patient will no longer endorse SI/HI or other thoughts of self harm.    2.  Goal (s):Reduce depression (rated at ten today)  Met: Yes  Target date: d/c  As evidenced by: Patient will rate symptoms at four or below    3.  Goal(s):.stabilize on meds   Met:  No  Target date: d/c  As evidenced by: Patient will report being stabilized on medications - less symptomatic    4.  Goal(s): Refer for outpatient follow up   Met:  No  Target date: d/c  As evidenced by: Follow up appointment scheduled    Attendees: Patient:   10/21/2012 8:30 AM  Physican:   10/21/2012 8:30 AM  Nursing:  Berneice Heinrich, RN 10/21/2012 8:30 AM   Nursing:   Harold Barban, RN 10/21/2012 8:30 AM   Clinical Social Worker:  Juline Patch, LCSW 10/21/2012 8:30 AM   Other: Tera Helper, NP 10/21/2012 8:30 AM   Other:   Patton Salles, Clinical Social Worker, LCSW  10/21/2012 8:30 AM Other: Leighton Parody, Rn     10/21/2012 8:30 AM  Interdisciplinary Treatment Plan Update (Adult)  Date:  10/21/2012  Time Reviewed:  9:45 AM   Progress in Treatment: Attending groups:   Yes   Participating in groups:  Yes Taking medication as prescribed:  Yes Tolerating medication:  Yes Family/Significant othe contact made: Contact made with family Patient understands diagnosis:  Yes Discussing patient identified problems/goals with staff: Yes Medical problems stabilized or resolved: Yes Denies suicidal/homicidal ideation:Yes Issues/concerns per patient self-inventory:  Other:   New problem(s) identified:  Reason for Continuation of Hospitalization: Anxiety Depression Medication stabilization Suicidal ideation  Interventions implemented related to continuation of hospitalization:  Medication Management; safety checks q 15 mins  Additional comments:  Estimated length of stay:  2-3 days  Discharge Plan:  Home with outpatient follow up  New goal(s):  Review of initial/current patient goals per problem list:    1.  Goal(s): Eliminate SI/other thoughts of self harm   Met:  No  Target date: d/c  As evidenced by: Patient no longer endorsing SI/HI or other thoughts of self harm.    2.  Goal (s):Reduce depression/anxiety  Met: No  Target date: d/c  As evidenced by: Patient currently rating symptoms at four or below    3.  Goal(s):.stabilize on meds   Met:  No  Target date: d/c  As evidenced by: Patient reports being stabilized on medications - less symptomatic    4.  Goal(s): Refer for outpatient follow up  Met:  No  Target date: d/c  As evidenced by: Follow up appointment scheduled    Attendees: Patient:   10/21/2012 9:45 AM  Physican:  Patrick North, MD 10/21/2012 9:45 AM  Nursing:  Neill Loft, RN 10/21/2012 9:45 AM   Nursing:   Quintella Reichert, RN 10/21/2012 9:45 AM    Clinical Social Worker:  Juline Patch, LCSW 10/21/2012 9:45 AM   Other: Serena Colonel, FNP 10/21/2012 9:45 AM   Other:  Patton Salles, Clinical Social Worker, LCSW  10/21/2012 9:45 AM Other:        10/21/2012 9:45 AM

## 2012-10-21 NOTE — Progress Notes (Signed)
Psychoeducational Group Note  Date:  10/21/2012 Time:  2015  Group Topic/Focus:  Wrap-Up Group:   The focus of this group is to help patients review their daily goal of treatment and discuss progress on daily workbooks.  Participation Level:  Did Not Attend  Participation Quality:  N/A Did not attend  Affect:    Cognitive:    Insight:    Engagement in Group:    Additional Comments: Did not attend. Patient was in bed, awake and asleep, for the duration of wrap-up group tonight.  Danira Nylander, Newton Pigg 10/21/2012, 10:13 PM

## 2012-10-21 NOTE — Progress Notes (Signed)
[facility] LCSW Group Therapy      Emotional Regulation 1:15 - 2:30 PM           10/21/2012 2:16 PM  Type of Therapy:  Group Therapy  Participation Level:  Did Not Attend  Participation Quality:    Affect:    Cognitive:    Insight:    Engagement in Therapy:    Modes of Intervention:    Summary of Progress/Problems:  [name] 10/21/2012, 2:16 PM

## 2012-10-22 DIAGNOSIS — F339 Major depressive disorder, recurrent, unspecified: Secondary | ICD-10-CM

## 2012-10-22 NOTE — Progress Notes (Signed)
Psychoeducational Group Note  Date:  10/22/2012 Time:  1100   Group Topic/Focus:  Healthy Communication:   The focus of this group is to discuss communication, barriers to communication, as well as healthy ways to communicate with others.  Participation Level: Did Not Attend  Participation Quality:  Not Applicable  Affect:  Not Applicable  Cognitive:  Not Applicable  Insight:  Not Applicable  Engagement in Group: Not Applicable  Additional Comments:    Noah Charon 10/22/2012, 12:04 PM

## 2012-10-22 NOTE — [facility] Counselor (Signed)
Adult Comprehensive Assessment  Patient ID: Raymond [name], male   DOB: [birth year], 61 y.o.   MRN: [id number]  Information Source: Information source: Patient  Current Stressors:  Educational / Learning stressors: None Employment / Job issues: None - P atient retired September 2013 Family Relationships: Patient reports living with stronged willed people who are always right and he is always wrong Surveyor, quantity / Lack of resources (include bankruptcy): Finances are okay but not great Housing / Lack of housing: None Physical health (include injuries & life threatening diseases): HTN and Migraines Social relationships: Patient reports  not having any social relationships Substance abuse: Denies Bereavement / Loss: Denies  Living/Environment/Situation:  Living Arrangements: Spouse/significant other Living conditions (as described by patient or guardian): No love  - boring How long has patient lived in current situation?: 15 years What is atmosphere in current home: Other (Comment) (Not loving)  Family History:  Marital status: Married Number of Years Married: 39  What types of issues is patient dealing with in the relationship?: Feelings of being unheard and unloved - no support Additional relationship information: Patient has no plans to leave the relationship and does not believe change is possible Does patient have children?: Yes How many children?: 2  How is patient's relationship with their children?: For the most part, good relationshi  Childhood History:  By whom was/is the patient raised?: Mother Additional childhood history information: Father was alcoholic and not involved in his life Description of patient's relationship with caregiver when they were a child: Okay.  Mother worked hard and did the best she could. Patient's description of current relationship with people who raised him/her: Okay but does not spend much time with mother Does patient have siblings?: Yes Number of  Siblings: 2  Description of patient's current relationship with siblings: Okay  Did patient suffer any verbal/emotional/physical/sexual abuse as a child?: No Did patient suffer from severe childhood neglect?: No Has patient ever been sexually abused/assaulted/raped as an adolescent or adult?: No Witnessed domestic violence?: No Has patient been effected by domestic violence as an adult?: No  Education:  Highest grade of school patient has completed: Two years of college Currently a student?: No Learning disability?: No  Employment/Work Situation:   Employment situation: Unemployed (Patient retired September 2013) What is the longest time patient has a held a job?: 20 Years 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 [employer] Has patient ever been in the [employer]?: Yes (Describe in comment) (Patient served in Licensed conveyancer) Has patient ever served in combat?: Yes Patient description of combat service: [place]  Financial Resources:   Surveyor, quantity resources:  (Retirement) Does patient have a Lawyer or guardian?: No  Alcohol/Substance Abuse:   What has been your use of drugs/alcohol within the last 12 months?: Patient denies If attempted suicide, did drugs/alcohol play a role in this?: No Alcohol/Substance Abuse Treatment Hx: Denies past history Has alcohol/substance abuse ever caused legal problems?: No  Social Support System:   Forensic psychologist System: None Type of faith/religion: None How does patient's faith help to cope with current illness?: N/A  Leisure/Recreation:   Leisure and Hobbies: Enjoys sports activities with grandsons  Strengths/Needs:   What things does the patient do well?: Unable to identify anything he does well In what areas does patient struggle / problems for patient: Children  Discharge Plan:   Does patient have access to transportation?: Yes Will patient be returning to same living situation after discharge?:  Yes Currently receiving community  mental health services: No If no, would patient like referral for services when discharged?: Yes (What county?) [facility] sales representative) Does patient have financial barriers related to discharge medications?: No  Summary/Recommendations:  Orenthal Debski is a 61 years old Caucasian male who admitted with Major Depression Disorder.  He will benefit from crisis stabilization, evaluation for medication, psycho-education groups for coping skills development, group therapy and case management for discharge planning.     Kalya Troeger, Joesph July. 10/22/2012

## 2012-10-22 NOTE — Progress Notes (Signed)
[facility] LCSW Aftercare Discharge Planning Group Note       8:30-9:30 AM   10/22/2012 10:03 AM  Participation Quality:  Patient did not attend group  Affect:    Cognitive:    Insight:    Engagement in Group:    Modes of Intervention:    Summary of Progress/Problems:  [name] 10/22/2012, 10:03 AM

## 2012-10-22 NOTE — Progress Notes (Signed)
Wife, [name], called this evening inquiring at to pt status. Pt was asked and pt gave verbal permission for wife to have code number and to let this staff member tell wife that he was doing ok but did not wish to speak to her and no other information was released

## 2012-10-22 NOTE — Clinical Social Work Note (Signed)
Writer met with patient to complete PSA.  He endorses depression at six and hopelessness/helplessness at ten.  Patient denies SI.  Patient shared he is not here to get better but as a refuge from his environment.  He advised that he that he has been married for 39 years and that in his home everyone is right except him and he is expected to acknowledge their being right and him being wrong.  He will need referral for outpatient services.  Patient has home and transportation.

## 2012-10-22 NOTE — Clinical Social Work Note (Signed)
[facility] LCSW Group Therapy      Living a Balanced Life  1:15-2:30             10/22/2012 3:46 PM   Type of Therapy:  Group Therapy  Participation Level:  Did not attend  Participation Quality:    Affect:   Cognitive:    Insight:    Engagement in Therapy:   Modes of Intervention:  Discussion Exploration Problem-Solving Supportive Education  Summary of Progress/Problems:  [name] 10/22/2012 3:48 PM [facility] LCSW Group Therapy            10/22/2012 3:49 PM    Type of Therapy:  Group Therapy  Participation Level:  Appropriate  Participation Quality:  Appropriate  Affect:  Appropriate  Cognitive:  Attentive Appropriate  Insight:  Engaged  Engagement in Therapy:  Engaged  Modes of Intervention:  Discussion Exploration Problem-Solving Supportive  Summary of Progress/Problems:  [name] 10/22/2012 3:49 PM

## 2012-10-22 NOTE — Progress Notes (Signed)
[facility] INPATIENT:  Family/Significant Other Suicide Prevention Education  Suicide Prevention Education:  Patient Refusal for Family/Significant Other Suicide Prevention Education: The patient Raymond [name] has refused to provide written consent for family/significant other to be provided Family/Significant Other Suicide Prevention Education during admission and/or prior to discharge.  Physician notified.  [name] 10/22/2012, 12:31 PM

## 2012-10-22 NOTE — Progress Notes (Signed)
[facility] MD Progress Note  10/22/2012 10:13 AM Raymond [name]  MRN:  [id number]  Subjective: "I came here because I'm depressed. My depression rate is currently at #6. I have a lot going on in my life in general. It got to a point where by I was sleeping day and night. That is why I came into the hospital".  Diagnosis:   Axis I: Major depressive disorder, recurrent Axis II: Deferred Axis III:  Past Medical History  Diagnosis Date  . Hypertension   . Migraines   . Sleep apnea   . Depression   . Arthritis    Axis IV: other psychosocial or environmental problems Axis V: 41-50 serious symptoms  ADL's:  Intact  Sleep: Good  Appetite:  Good  Suicidal Ideation:  Denies Homicidal Ideation:  Denies   AEB (as evidenced by): Per patient reports.  Psychiatric Specialty Exam: Review of Systems  Constitutional: Negative.   HENT: Negative.   Eyes: Negative.   Respiratory: Negative.   Cardiovascular: Negative.   Gastrointestinal: Negative.   Genitourinary: Negative.   Musculoskeletal: Negative.   Psychiatric/Behavioral: Positive for depression (Currently being stabilized with medication.). Negative for suicidal ideas and hallucinations. The patient has insomnia (Currently on medication to assisit with sleep.). The patient is not nervous/anxious.     Blood pressure 130/81, pulse 80, temperature 98.2 F (36.8 C), temperature source Oral, resp. rate 16, height 5\' 5"  (1.651 m), weight 106.595 kg (235 lb).Body mass index is 39.11 kg/(m^2).  General Appearance: Casual  Eye Contact::  Good  Speech:  Clear and Coherent  Volume:  Decreased  Mood:  Depressed  Affect:  Flat  Thought Process:  Coherent and Intact  Orientation:  Full (Time, Place, and Person)  Thought Content:  Rumination  Suicidal Thoughts:  No  Homicidal Thoughts:  No  Memory:  Immediate;   Good Recent;   Good Remote;   Good  Judgement:  Fair  Insight:  Fair  Psychomotor Activity:  Normal  Concentration:  Fair   Recall:  Good  Akathisia:  No  Handed:  Right  AIMS (if indicated):     Assets:  Communication Skills Desire for Improvement  Sleep:  Number of Hours: 6.75    Current Medications: Current Facility-Administered Medications  Medication Dose Route Frequency Provider Last Rate Last Dose  . acetaminophen (TYLENOL) tablet 650 mg  650 mg Oral Q6H PRN Kerry Hough, PA      . alum & mag hydroxide-simeth (MAALOX/MYLANTA) 200-200-20 MG/5ML suspension 30 mL  30 mL Oral Q4H PRN Kerry Hough, PA      . amoxicillin (AMOXIL) capsule 500 mg  500 mg Oral Q12H Kerry Hough, PA   500 mg at 10/22/12 0806  . citalopram (CELEXA) tablet 40 mg  40 mg Oral Daily Himabindu Ravi, MD   40 mg at 10/22/12 0811  . DULoxetine (CYMBALTA) DR capsule 60 mg  60 mg Oral Daily Himabindu Ravi, MD   60 mg at 10/22/12 0811  . hydrochlorothiazide (MICROZIDE) capsule 12.5 mg  12.5 mg Oral Daily Nanine Means, NP   12.5 mg at 10/22/12 0810  . hydrOXYzine (ATARAX/VISTARIL) tablet 50 mg  50 mg Oral QHS,MR X 1 Kerry Hough, PA   50 mg at 10/21/12 2156  . losartan (COZAAR) tablet 125 mg  125 mg Oral Daily Kerry Hough, PA   125 mg at 10/22/12 5284  . magnesium hydroxide (MILK OF MAGNESIA) suspension 30 mL  30 mL Oral Daily PRN Kerry Hough,  PA      . naproxen (NAPROSYN) tablet 500 mg  500 mg Oral TID PRN Kerry Hough, PA   500 mg at 10/21/12 1950  . topiramate (TOPAMAX) tablet 100 mg  100 mg Oral QHS Kerry Hough, PA   100 mg at 10/21/12 2156  . verapamil (CALAN) tablet 120 mg  120 mg Oral Daily Kerry Hough, PA   120 mg at 10/22/12 1610    Lab Results:  Results for orders placed during the hospital encounter of 10/20/12 (from the past 48 hour(s))  TSH     Status: Normal   Collection Time   10/21/12  6:35 AM      Component Value Range Comment   TSH 0.635  0.350 - 4.500 uIU/mL   HEMOGLOBIN A1C     Status: Abnormal   Collection Time   10/21/12  6:35 AM      Component Value Range Comment   Hemoglobin A1C  5.7 (*) <5.7 %    Mean Plasma Glucose 117 (*) <117 mg/dL   LIPID PANEL     Status: Abnormal   Collection Time   10/21/12  6:35 AM      Component Value Range Comment   Cholesterol 207 (*) 0 - 200 mg/dL    Triglycerides 960 (*) <150 mg/dL    HDL 45  >45 mg/dL    Total CHOL/HDL Ratio 4.6      VLDL 31  0 - 40 mg/dL    LDL Cholesterol 409 (*) 0 - 99 mg/dL   HEPATIC FUNCTION PANEL     Status: Abnormal   Collection Time   10/21/12  6:35 AM      Component Value Range Comment   Total Protein 6.6  6.0 - 8.3 g/dL    Albumin 4.1  3.5 - 5.2 g/dL    AST 19  0 - 37 U/L    ALT 16  0 - 53 U/L    Alkaline Phosphatase 35 (*) 39 - 117 U/L    Total Bilirubin 0.6  0.3 - 1.2 mg/dL    Bilirubin, Direct 0.1  0.0 - 0.3 mg/dL    Indirect Bilirubin 0.5  0.3 - 0.9 mg/dL     Physical Findings: AIMS: Facial and Oral Movements Muscles of Facial Expression: None, normal Lips and Perioral Area: None, normal Jaw: None, normal Tongue: None, normal,Extremity Movements Upper (arms, wrists, hands, fingers): None, normal Lower (legs, knees, ankles, toes): None, normal, Trunk Movements Neck, shoulders, hips: None, normal, Overall Severity Severity of abnormal movements (highest score from questions above): None, normal Incapacitation due to abnormal movements: None, normal Patient's awareness of abnormal movements (rate only patient's report): No Awareness, Dental Status Current problems with teeth and/or dentures?: No Does patient usually wear dentures?: No  CIWA:    COWS:     Treatment Plan Summary: Daily contact with patient to assess and evaluate symptoms and progress in treatment Medication management  Plan: No new changes made on the current treatment plan. Encouraged group session participation. Continue current treatment plan.  Medical Decision Making Problem Points:  Established problem, stable/improving (1), Review of last therapy session (1) and Review of psycho-social stressors (1) Data  Points:  Review and summation of old records (2) Review of medication regiment & side effects (2)  I certify that inpatient services furnished can reasonably be expected to improve the patient's condition.   Armandina Stammer I 10/22/2012, 10:13 AM

## 2012-10-22 NOTE — Progress Notes (Signed)
Psychoeducational Group Note  Date:  10/22/2012 Time: 1000  Group Topic/Focus:  Self Esteem Action Plan:   The focus of this group is to help patients create a plan to continue to build self-esteem after discharge.  Participation Level:  None  Participation Quality:  Did not share  Affect:  Did not share  Cognitive:  Did not share  Insight:  None  Engagement in Group:  Did not share  Additional Comments:  Patient attended group and did not share. Patient was asked to share on what self esteem was like in his life. Patient was given a self esteem action plan and asked to complete worksheets. Patient reviewed form and did not speak when asked to speak.  Karleen Hampshire Brittini 10/22/2012, 10:51 AM

## 2012-10-22 NOTE — Progress Notes (Signed)
D: Patient appropriate and cooperative with staff. Patient's mood/affect is depressed and irritable, but has improved prior to previous shift. Declined self inventory sheet. Patient did not attend groups. Patient continues to isolate himself in his room away from others.  A: Scheduled medications administered per MD orders. Support and encouragement provided to patient. Maintain Q15 minute observations for safety.  R: Patient compliant with medication regimen. Passive SI, but contracts for safety. Denies HI/AVH. Patient remains safe.

## 2012-10-23 MED ORDER — DULOXETINE HCL 30 MG PO CPEP
30.0000 mg | ORAL_CAPSULE | Freq: Every day | ORAL | Status: DC
  Administered 2012-10-24 – 2012-10-27 (×4): 30 mg via ORAL
  Filled 2012-10-23 (×6): qty 1

## 2012-10-23 NOTE — Progress Notes (Signed)
D - Patient calm and cooperative with staff. Patient continues to isolate himself in his room away from others. Patient did not attend group tonight. Patient remains depressed and at times irritable. Patient rates depression "7/8" and hopelessness "7/8." Patient denies SI/HI or hallucinations.  A - Encouragement and support offered through therapeutic conversations. Medications given as ordered. Encouraged patient to speak with staff about any concerns or questions.  R - Patient safety maintained with Q 15 minute checks. Will continue to monitor patient.

## 2012-10-23 NOTE — Progress Notes (Deleted)
Psychoeducational Group Note  Date:  10/23/2012 Time:  2000  Group Topic/Focus:  Goals Group:   The focus of this group is to help patients establish daily goals to achieve during treatment and discuss how the patient can incorporate goal setting into their daily lives to aide in recovery.  Participation Level:  Active  Participation Quality:  Appropriate  Affect:  Defensive  Cognitive:  Appropriate  Insight:  Developing/Improving  Engagement in Group:  Developing/Improving  Additional Comments:  Patient stated that she had a good day. Patient shared that he got along with everyone, socialized, and went to all the groups. From the groups, patient has learned that he "need to go to grief counseling" upon discharge. Patient hopes to get in better spirits and learn more positive coping skills.   Lyndee Hensen 10/23/2012, 9:53 PM

## 2012-10-23 NOTE — Progress Notes (Signed)
Writer spoke with patient 1:1 and he received his scheduled 2000 medication. Patient was encouraged to attend group and he declined. Writer spoke with patient after group and patient reports that his problem is not going to get any better and he needed to come here and get some rest and he will return home to the same problem. Patient avoids eye contact and talks very negative concerning him home situation. Patient currently denies si/hi/a/v hallucinations. Support and encouragement offered but patient remains hopeless. Safety maintained on unit with 15 min checks, will continue to  monitor.

## 2012-10-23 NOTE — Progress Notes (Signed)
Psychoeducational Group Note  Date:  10/23/2012 Time:  2000  Group Topic/Focus:  Karaoke night   Participation Level:  Did Not Attend  Participation Quality:    Affect:    Cognitive:    Insight:    Engagement in Group:    Additional Comments:  Pt didn't attend karaoke group this evening.  Boden Stucky A 10/23/2012, 3:44 AM

## 2012-10-23 NOTE — Progress Notes (Signed)
Psychoeducational Group Note  Date:  10/23/2012 Time:  1000  Group Topic/Focus:  Wellness Toolbox:   The focus of this group is to discuss various aspects of wellness, balancing those aspects and exploring ways to increase the ability to experience wellness.  Patients will create a wellness toolbox for use upon discharge.  Participation Level:  Active  Participation Quality:  Appropriate, Sharing and Supportive  Affect:  Appropriate  Cognitive:  Appropriate  Insight:  Supportive  Engagement in Group:  Supportive  Additional Comments:  none  Adi Seales M 10/23/2012, 2:02 PM  

## 2012-10-23 NOTE — Progress Notes (Addendum)
Pt is very withdrawn and lying in his bed refusing to go to groups. 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 He also stated he chose to come here to get away from family. Pt has no eye contact and at times can be somewhat arrogant. He stated he has been married times 39 years and the marriage is failing. Pt did say counseling is not an option.He has two children in their 30's that he says he has an okay relationship with. Pt denies SI or HI and contracts for safety. When asked if he had ever tried to kill himself he would not respond. Pt stated he would like to be left alone and would prefer not being checked on. Informed pt our goal is to make him safe and q15 minute checks would continue. Pt encouraged to get out of bed and attend groups.His response- I do not care to ,"just leave me alone please."Will continue to monitor very closely. MD. Dr Daleen Bo made aware. Pt has stayed in bed the majority of the day.he did go to [employer] for lunch and dinner.Pt has remained in his room most of the day with very little eye contact. Pt did take a shower after nurse made multiple request and changed his clothes that nurse obtained from the clothing closet. Pt s clothes were placed in the wash. Pt . Is very isolative and does not want to be bothered. He has not attended any groups and states he does not plan to go to any groups. Pt denies SI or HI however he appears extremely depressed and warrants frequent observation. MD aware- Dr Daleen Bo. Safety is maintained with q 15 minute checks .

## 2012-10-23 NOTE — Clinical Social Work Note (Addendum)
[facility] LCSW Group Therapy  10/23/2012 6:43 PM  Type of Therapy:  Group Therapy  Participation Level:  Did not attend  Clide Dales 10/23/2012, 6:19 PM

## 2012-10-23 NOTE — Progress Notes (Signed)
Psychoeducational Group Note  Date:  10/23/2012 Time:  2000  Group Topic/Focus:  Goals Group:   The focus of this group is to help patients establish daily goals to achieve during treatment and discuss how the patient can incorporate goal setting into their daily lives to aide in recovery.  Participation Level:  Did Not Attend  Participation Quality:  Appropriate  Affect:  Appropriate  Cognitive:  Appropriate  Insight:  None  Engagement in Group:  None  Additional Comments:  Patient was invited to group, but remained in bed.  Lyndee Hensen 10/23/2012, 10:15 PM

## 2012-10-23 NOTE — Progress Notes (Signed)
[facility] LCSW Aftercare Discharge Planning Group Note  10/23/2012 12:58 PM  Participation Quality:  Did not attend    [name], Raymond Gravel 10/23/2012, 12:58 PM

## 2012-10-23 NOTE — Progress Notes (Signed)
Psychoeducational Group Note  Date:  10/23/2012 Time:  1100  Group Topic/Focus:  Early Warning Signs:   The focus of this group is to help patients identify signs or symptoms they exhibit before slipping into an unhealthy state or crisis.  Participation Level:  Active  Participation Quality:  Appropriate, Sharing and Supportive  Affect:  Appropriate  Cognitive:  Appropriate  Insight:  Supportive  Engagement in Group:  Supportive  Additional Comments:  none  Raymond [name] E 10/23/2012, 1:54 PM

## 2012-10-23 NOTE — Progress Notes (Signed)
[facility] MD Progress Note  10/23/2012 10:47 AM NAVRAJ DREIBELBIS  MRN:  [id number] Subjective:  Patient lying in bed, states his mood is not good. Not responsive to questions. Not attending groups.  Diagnosis:   Axis I: Depressive Disorder NOS Axis II: No diagnosis Axis III:  Past Medical History  Diagnosis Date  . Hypertension   . Migraines   . Sleep apnea   . Depression   . Arthritis    Axis IV: occupational problems and other psychosocial or environmental problems Axis V: 41-50 serious symptoms  ADL's:  Intact  Sleep: Fair  Appetite:  Fair   Psychiatric Specialty Exam: Review of Systems  Constitutional: Negative.   HENT: Negative.   Eyes: Negative.   Respiratory: Negative.   Cardiovascular: Negative.   Gastrointestinal: Negative.   Genitourinary: Negative.   Musculoskeletal: Negative.   Skin: Negative.   Neurological: Negative.   Endo/Heme/Allergies: Negative.   Psychiatric/Behavioral: Positive for depression and suicidal ideas. The patient is nervous/anxious.     Blood pressure 132/76, pulse 82, temperature 98.3 F (36.8 C), temperature source Oral, resp. rate 12, height 5\' 5"  (1.651 m), weight 106.595 kg (235 lb).Body mass index is 39.11 kg/(m^2).  General Appearance: Disheveled  Eye Contact::  Minimal  Speech:  Slow  Volume:  Decreased  Mood:  Depressed, Dysphoric and Hopeless  Affect:  Constricted and Flat  Thought Process:  Linear  Orientation:  Full (Time, Place, and Person)  Thought Content:  Rumination  Suicidal Thoughts:  No  Homicidal Thoughts:  No  Memory:  Immediate;   Fair Recent;   Fair Remote;   Fair  Judgement:  Impaired  Insight:  Lacking  Psychomotor Activity:  Decreased  Concentration:  Fair  Recall:  Fair  Akathisia:  Negative  Handed:  Right  AIMS (if indicated):     Assets:  Housing  Sleep:  Number of Hours: 6.25    Current Medications: Current Facility-Administered Medications  Medication Dose Route Frequency Provider Last Rate  Last Dose  . acetaminophen (TYLENOL) tablet 650 mg  650 mg Oral Q6H PRN Kerry Hough, PA      . alum & mag hydroxide-simeth (MAALOX/MYLANTA) 200-200-20 MG/5ML suspension 30 mL  30 mL Oral Q4H PRN Kerry Hough, PA      . amoxicillin (AMOXIL) capsule 500 mg  500 mg Oral Q12H Kerry Hough, PA   500 mg at 10/23/12 0940  . citalopram (CELEXA) tablet 40 mg  40 mg Oral Daily Khayri Kargbo, MD   40 mg at 10/23/12 0940  . DULoxetine (CYMBALTA) DR capsule 30 mg  30 mg Oral Daily Layce Sprung, MD      . hydrochlorothiazide (MICROZIDE) capsule 12.5 mg  12.5 mg Oral Daily Nanine Means, NP   12.5 mg at 10/23/12 0941  . hydrOXYzine (ATARAX/VISTARIL) tablet 50 mg  50 mg Oral QHS,MR X 1 Kerry Hough, PA   50 mg at 10/22/12 2102  . losartan (COZAAR) tablet 125 mg  125 mg Oral Daily Kerry Hough, PA   125 mg at 10/23/12 0941  . magnesium hydroxide (MILK OF MAGNESIA) suspension 30 mL  30 mL Oral Daily PRN Kerry Hough, PA      . naproxen (NAPROSYN) tablet 500 mg  500 mg Oral TID PRN Kerry Hough, PA   500 mg at 10/21/12 1950  . topiramate (TOPAMAX) tablet 100 mg  100 mg Oral QHS Kerry Hough, PA   100 mg at 10/22/12 2103  . verapamil (CALAN)  tablet 120 mg  120 mg Oral Daily Kerry Hough, PA   120 mg at 10/23/12 4782    Lab Results: No results found for this or any previous visit (from the past 48 hour(s)).  Physical Findings: AIMS: Facial and Oral Movements Muscles of Facial Expression: None, normal Lips and Perioral Area: None, normal Jaw: None, normal Tongue: None, normal,Extremity Movements Upper (arms, wrists, hands, fingers): None, normal Lower (legs, knees, ankles, toes): None, normal, Trunk Movements Neck, shoulders, hips: None, normal, Overall Severity Severity of abnormal movements (highest score from questions above): None, normal Incapacitation due to abnormal movements: None, normal Patient's awareness of abnormal movements (rate only patient's report): No Awareness,  Dental Status Current problems with teeth and/or dentures?: No Does patient usually wear dentures?: No  CIWA:    COWS:     Treatment Plan Summary: Daily contact with patient to assess and evaluate symptoms and progress in treatment Medication management  Plan: Decrease Cymbalta to 30mg  po qd. Continue to monitor and encourage patient to attend groups.  Medical Decision Making Problem Points:  Established problem, worsening (2), Review of last therapy session (1) and Review of psycho-social stressors (1) Data Points:  Review of medication regiment & side effects (2) Review of new medications or change in dosage (2)  I certify that inpatient services furnished can reasonably be expected to improve the patient's condition.   Willie Loy 10/23/2012, 10:47 AM

## 2012-10-24 MED ORDER — NORTRIPTYLINE HCL 10 MG PO CAPS
10.0000 mg | ORAL_CAPSULE | Freq: Every day | ORAL | Status: DC
  Administered 2012-10-24 – 2012-10-26 (×3): 10 mg via ORAL
  Filled 2012-10-24 (×5): qty 1

## 2012-10-24 NOTE — Clinical Social Work Note (Signed)
[facility] Group Notes: (Clinical Social Work)   10/24/2012   3-4pm   Type of Therapy:  Group Therapy   Participation Level:  Did Not Attend    Ambrose Mantle, LCSW 10/24/2012, 4:16 PM

## 2012-10-24 NOTE — Progress Notes (Signed)
Group Topic/Focus:  Identifying Needs:   The focus of this group is to help patients identify their personal needs that have been historically problematic and identify healthy behaviors to address their needs.  Participation Level:  Active  Participation Quality:  Appropriate  Affect:  Appropriate  Cognitive:  Appropriate and Disorganized  Insight:  limited  Engagement in Group:  Listened Additional Comments:    Dione Housekeeper

## 2012-10-24 NOTE — Progress Notes (Signed)
D Verlan reminas disconnected, disengaged and acutely depressed. HE avoids making eye contact with staff. HE spends most of his time in his bed...asleep. HE did take his AM meds and he did attend his AM group ( [facility]) but other wise escapes in his sleep. HE is flat, depressed and has a blunted affect.   A He is encouraged to complete his AM self inventory but chooses not to.   R Safety is in place and POC cont .

## 2012-10-24 NOTE — Progress Notes (Signed)
Psychoeducational Group Note  Date:  10/24/2012 Time:  2000  Group Topic/Focus:  Wrap-Up Group:   The focus of this group is to help patients review their daily goal of treatment and discuss progress on daily workbooks.  Participation Level: Did Not Attend  Participation Quality:  Not Applicable  Affect:  Not Applicable  Cognitive:  Not Applicable  Insight:  Not Applicable  Engagement in Group: Not Applicable  Additional Comments:  The patient slept in his room rather than attend this evening's group.   Hazle Coca S 10/24/2012, 11:39 PM

## 2012-10-24 NOTE — Progress Notes (Signed)
Goals Group This is a group that identifies the program and helps them to start working on their packets for the day.  Each person sets a goal for the day that is measurable Pt did not attend

## 2012-10-25 DIAGNOSIS — F3289 Other specified depressive episodes: Secondary | ICD-10-CM

## 2012-10-25 DIAGNOSIS — F329 Major depressive disorder, single episode, unspecified: Secondary | ICD-10-CM

## 2012-10-25 NOTE — Clinical Social Work Note (Addendum)
[facility] Group Notes: (Clinical Social Work)   10/25/2012      Type of Therapy:  Group Therapy   Participation Level:  Did Not Attend    Najae Rathert Grossman-Orr, LCSW 10/25/2012, 4:43 PM     

## 2012-10-25 NOTE — Progress Notes (Signed)
Patient has been in his room lying in bed resting and sleeping off and on. Patient did not attend group and has not been interested in interacting with the other patients on the unit. Patient reports that he is ready to be discharged and feels that he has been here at [facility] long enough. Writer encouraged patient to open up and talk with Clinical research associate but he chooses not to. Patient remains isolative, sad and blunted affect.  Patient is compliant with his medications, voices no complaints, denies si/hi/a/v hallucinations. Support and encouragement offered, safety maintained on unit. Will continue to monitor with 15 min checks.

## 2012-10-25 NOTE — Progress Notes (Signed)
Group Topic/Focus:  Making Healthy Choices:   The focus of this group is to help patients identify negative/unhealthy choices they were using prior to admission and identify positive/healthier coping strategies to replace them upon discharge.  Participation Level:  Did not attend     Rosalva Neary A 10/25/2012   

## 2012-10-25 NOTE — Progress Notes (Signed)
[facility] MD Progress Note  10/25/2012 11:32 AM Raymond [name]  MRN:  [id number] Subjective:  Came in to get away from everyone for a while. Wife knows he is here and they stay with her mother.  11:30 am in bed - says the Nortriptyline helped with sleep and pain last night . Denies a need to increase dose as suggested by PharmD. Doesn't seem to be concerned with getting home for Christmas .  Diagnosis:   Axis I: Depressive Disorder NOS Axis II: No diagnosis Axis III:  Past Medical History  Diagnosis Date  . Hypertension   . Migraines   . Sleep apnea   . Depression   . Arthritis    Axis IV: occupational problems and other psychosocial or environmental problems Axis V: 41-50 serious symptoms  ADL's:  Intact  Sleep: Fair  Appetite:  Fair   Psychiatric Specialty Exam: Review of Systems  Constitutional: Negative.   HENT: Negative.   Eyes: Negative.   Respiratory: Negative.   Cardiovascular: Negative.   Gastrointestinal: Negative.   Genitourinary: Negative.   Musculoskeletal: Negative.   Skin: Negative.   Neurological: Negative.   Endo/Heme/Allergies: Negative.   Psychiatric/Behavioral: Positive for depression and suicidal ideas. The patient is nervous/anxious.     Blood pressure 123/68, pulse 68, temperature 98 F (36.7 C), temperature source Oral, resp. rate 17, height 5\' 5"  (1.651 m), weight 106.595 kg (235 lb).Body mass index is 39.11 kg/(m^2).  General Appearance: Disheveled  Eye Contact::  Minimal  Speech:  Slow  Volume:  Decreased  Mood:  Depressed, Dysphoric and Hopeless  Affect:  Constricted and Flat  Thought Process:  Linear  Orientation:  Full (Time, Place, and Person)  Thought Content:  Rumination  Suicidal Thoughts:  No  Homicidal Thoughts:  No  Memory:  Immediate;   Fair Recent;   Fair Remote;   Fair  Judgement:  Impaired  Insight:  Lacking  Psychomotor Activity:  Decreased  Concentration:  Fair  Recall:  Fair  Akathisia:  Negative  Handed:  Right   AIMS (if indicated):     Assets:  Housing  Sleep:  Number of Hours: 6.75    Current Medications: Current Facility-Administered Medications  Medication Dose Route Frequency Provider Last Rate Last Dose  . acetaminophen (TYLENOL) tablet 650 mg  650 mg Oral Q6H PRN Raymond Hough, PA      . alum & mag hydroxide-simeth (MAALOX/MYLANTA) 200-200-20 MG/5ML suspension 30 mL  30 mL Oral Q4H PRN Raymond Hough, PA      . amoxicillin (AMOXIL) capsule 500 mg  500 mg Oral Q12H Raymond Hough, PA   500 mg at 10/24/12 2003  . citalopram (CELEXA) tablet 40 mg  40 mg Oral Daily Raymond Ravi, MD   40 mg at 10/24/12 0858  . DULoxetine (CYMBALTA) DR capsule 30 mg  30 mg Oral Daily Raymond Ravi, MD   30 mg at 10/24/12 0858  . hydrochlorothiazide (MICROZIDE) capsule 12.5 mg  12.5 mg Oral Daily Raymond Means, NP   12.5 mg at 10/24/12 0858  . hydrOXYzine (ATARAX/VISTARIL) tablet 50 mg  50 mg Oral QHS,MR X 1 Raymond Hough, PA   50 mg at 10/24/12 2156  . losartan (COZAAR) tablet 125 mg  125 mg Oral Daily Raymond Hough, PA   125 mg at 10/24/12 0858  . magnesium hydroxide (MILK OF MAGNESIA) suspension 30 mL  30 mL Oral Daily PRN Raymond Hough, PA      . naproxen (NAPROSYN) tablet 500  mg  500 mg Oral TID PRN Raymond Hough, PA   500 mg at 10/21/12 1950  . nortriptyline (PAMELOR) capsule 10 mg  10 mg Oral QHS Raymond Ravi, MD   10 mg at 10/24/12 2156  . topiramate (TOPAMAX) tablet 100 mg  100 mg Oral QHS Raymond Hough, PA   100 mg at 10/24/12 2156  . verapamil (CALAN) tablet 120 mg  120 mg Oral Daily Raymond Hough, PA   120 mg at 10/24/12 0102    Lab Results: No results found for this or any previous visit (from the past 48 hour(s)).  Physical Findings: AIMS: Facial and Oral Movements Muscles of Facial Expression: None, normal Lips and Perioral Area: None, normal Jaw: None, normal Tongue: None, normal,Extremity Movements Upper (arms, wrists, hands, fingers): None, normal Lower (legs, knees,  ankles, toes): None, normal, Trunk Movements Neck, shoulders, hips: None, normal, Overall Severity Severity of abnormal movements (highest score from questions above): None, normal Incapacitation due to abnormal movements: None, normal Patient's awareness of abnormal movements (rate only patient's report): No Awareness, Dental Status Current problems with teeth and/or dentures?: No Does patient usually wear dentures?: No  CIWA:    COWS:     Treatment Plan Summary: Daily contact with patient to assess and evaluate symptoms and progress in treatment Medication management  Plan: Decrease Cymbalta to 30mg  po qd. Continue to monitor and encourage patient to attend groups.  Medical Decision Making Problem Points:  Established problem, worsening (2), Review of last therapy session (1) and Review of psycho-social stressors (1) Data Points:  Review of medication regiment & side effects (2) Review of new medications or change in dosage (2)  I certify that inpatient services furnished can reasonably be expected to improve the patient's condition.   Raymond [name],Raymond D. PA-C CAQ-Psych  10/25/2012, 11:32 AM

## 2012-10-25 NOTE — Progress Notes (Signed)
D Pt is seen sleeping in his bed. HE remains slightly more interactive today...allowing himself to make brief eye contact  And conversation with this nurse. He took his AM meds and attended his Life SKills group ( again) and was attentive during the group discussion.   A   He completes his self inventory and on it he writes he denied SI within the past 24 hrs and he rates his depression and hopelessness "6/6".   R Safety is in place and POC with therapeutic relationship fostered.

## 2012-10-25 NOTE — Progress Notes (Signed)
[facility] Group Notes:  (Counselor/Nursing/MHT/Case Management/Adjunct)  10/25/2012 11:36 PM  Type of Therapy:  Psychoeducational Skills  Participation Level:  Active  Participation Quality:  Appropriate  Affect:  Appropriate  Cognitive:  Appropriate  Insight:  Improving  Engagement in Group:  Engaged  Engagement in Therapy:  Engaged  Modes of Intervention:  Education  Summary of Progress/Problems: The patient was more conversational in group this evening as compared to yesterday. He states that he had a good day overall and attributes this to spending less time in his room and because he socialized more with his peers. His goal for tomorrow is to sit down with his doctor and discuss what his treatment plan is going to be.    Raymond [name] S 10/25/2012, 11:36 PM

## 2012-10-25 NOTE — Progress Notes (Signed)
Reviewed the note and agree with the plan of treatment...Helvi Royals, MD 

## 2012-10-25 NOTE — Progress Notes (Signed)
Patient received his hs medications and appears a little brighter today. Patient has been observed in the dayroom watching tv,  interaction with peers. Patient attended group this evening and participated. Patient denies si/hi/a/v hallucinations. Patient is concerned about when he will be discharged and looks forward to speaking with his doctor on tomorrow.

## 2012-10-26 MED ORDER — HYDROCHLOROTHIAZIDE 12.5 MG PO CAPS: 12.5000 mg | ORAL_CAPSULE | Freq: Every day | ORAL | Status: DC

## 2012-10-26 MED ORDER — VERAPAMIL HCL 120 MG PO TABS: 120.0000 mg | ORAL_TABLET | Freq: Every day | ORAL | Status: DC

## 2012-10-26 MED ORDER — TOPIRAMATE 100 MG PO TABS: 100.0000 mg | ORAL_TABLET | Freq: Every day | ORAL | Status: DC

## 2012-10-26 MED ORDER — NAPROXEN 500 MG PO TABS: 500.0000 mg | ORAL_TABLET | Freq: Three times a day (TID) | ORAL | Status: DC | PRN

## 2012-10-26 MED ORDER — HYDROXYZINE HCL 50 MG PO TABS: 50.0000 mg | ORAL_TABLET | Freq: Every evening | ORAL | Status: DC | PRN

## 2012-10-26 MED ORDER — NORTRIPTYLINE HCL 10 MG PO CAPS: 10.0000 mg | ORAL_CAPSULE | Freq: Every day | ORAL | Status: DC

## 2012-10-26 MED ORDER — CITALOPRAM HYDROBROMIDE 40 MG PO TABS: 40.0000 mg | ORAL_TABLET | Freq: Every day | ORAL | Status: DC

## 2012-10-26 MED ORDER — LOSARTAN POTASSIUM 25 MG PO TABS: 120.0000 mg | ORAL_TABLET | Freq: Every day | ORAL | Status: DC

## 2012-10-26 MED ORDER — DULOXETINE HCL 30 MG PO CPEP: 30.0000 mg | ORAL_CAPSULE | Freq: Every day | ORAL | Status: DC

## 2012-10-26 NOTE — Progress Notes (Signed)
Psychoeducational Group Note  Date:  10/26/2012 Time:  1100  Group Topic/Focus:  Self Care:   The focus of this group is to help patients understand the importance of self-care in order to improve or restore emotional, physical, spiritual, interpersonal, and financial health.  Raymond [name] [facility] 10/26/2012, 2:53 PM

## 2012-10-26 NOTE — Progress Notes (Signed)
D-Patient active on the unit this evening. His affect is flat and mood is depressed. He requested to add his wife on to the list of people that could know more about his care. He admitted to feeling better but stated "I still have a long way to go." A-Patient receptive to emotional support. He hopes to d/c tomorrow but expressed that he would be okay either way. R-Patient remains safe on the unit.

## 2012-10-26 NOTE — Progress Notes (Signed)
[facility] MD Progress Note  10/26/2012 11:08 AM Raymond [name]  MRN:  [id number]  Subjective:  Patient reports mood slightly improved. Tolerating the decrease in Cymbalta without side effects.  Diagnosis:   Axis I: Major Depression, Recurrent severe Axis II: Deferred Axis III:  Past Medical History  Diagnosis Date  . Hypertension   . Migraines   . Sleep apnea   . Depression   . Arthritis    Axis IV: occupational problems Axis V: 51-60 moderate symptoms  ADL's:  Intact  Sleep: Fair  Appetite:  Fair   Psychiatric Specialty Exam: Review of Systems  Constitutional: Negative.   HENT: Negative.   Eyes: Negative.   Respiratory: Negative.   Cardiovascular: Negative.   Gastrointestinal: Negative.   Genitourinary: Negative.   Musculoskeletal: Negative.   Skin: Negative.   Neurological: Negative.   Endo/Heme/Allergies: Negative.   Psychiatric/Behavioral: Positive for depression. The patient is nervous/anxious.     Blood pressure 125/73, pulse 70, temperature 98.2 F (36.8 C), temperature source Oral, resp. rate 18, height 5\' 5"  (1.651 m), weight 106.595 kg (235 lb).Body mass index is 39.11 kg/(m^2).  General Appearance: Casual  Eye Contact::  Fair  Speech:  Clear and Coherent  Volume:  Normal  Mood:  Dysphoric  Affect:  Blunt and Constricted  Thought Process:  Coherent  Orientation:  Full (Time, Place, and Person)  Thought Content:  WDL  Suicidal Thoughts:  No  Homicidal Thoughts:  No  Memory:  Immediate;   Fair Recent;   Fair Remote;   Fair  Judgement:  Fair  Insight:  Fair  Psychomotor Activity:  Decreased  Concentration:  Fair  Recall:  Fair  Akathisia:  No  Handed:  Right  AIMS (if indicated):     Assets:  Housing  Sleep:  Number of Hours: 6.5    Current Medications: Current Facility-Administered Medications  Medication Dose Route Frequency Provider Last Rate Last Dose  . acetaminophen (TYLENOL) tablet 650 mg  650 mg Oral Q6H PRN Kerry Hough, PA      .  alum & mag hydroxide-simeth (MAALOX/MYLANTA) 200-200-20 MG/5ML suspension 30 mL  30 mL Oral Q4H PRN Kerry Hough, PA      . citalopram (CELEXA) tablet 40 mg  40 mg Oral Daily Mathias Bogacki, MD   40 mg at 10/26/12 0810  . DULoxetine (CYMBALTA) DR capsule 30 mg  30 mg Oral Daily Shynice Sigel, MD   30 mg at 10/26/12 0810  . hydrochlorothiazide (MICROZIDE) capsule 12.5 mg  12.5 mg Oral Daily Nanine Means, NP   12.5 mg at 10/26/12 0810  . hydrOXYzine (ATARAX/VISTARIL) tablet 50 mg  50 mg Oral QHS,MR X 1 Kerry Hough, PA   50 mg at 10/25/12 2136  . losartan (COZAAR) tablet 125 mg  125 mg Oral Daily Kerry Hough, PA   125 mg at 10/26/12 0810  . magnesium hydroxide (MILK OF MAGNESIA) suspension 30 mL  30 mL Oral Daily PRN Kerry Hough, PA      . naproxen (NAPROSYN) tablet 500 mg  500 mg Oral TID PRN Kerry Hough, PA   500 mg at 10/21/12 1950  . nortriptyline (PAMELOR) capsule 10 mg  10 mg Oral QHS Osmara Drummonds, MD   10 mg at 10/25/12 2136  . topiramate (TOPAMAX) tablet 100 mg  100 mg Oral QHS Kerry Hough, PA   100 mg at 10/25/12 2136  . verapamil (CALAN) tablet 120 mg  120 mg Oral Daily Kerry Hough,  PA   120 mg at 10/26/12 1610    Lab Results: No results found for this or any previous visit (from the past 48 hour(s)).  Physical Findings: AIMS: Facial and Oral Movements Muscles of Facial Expression: None, normal Lips and Perioral Area: None, normal Jaw: None, normal Tongue: None, normal,Extremity Movements Upper (arms, wrists, hands, fingers): None, normal Lower (legs, knees, ankles, toes): None, normal, Trunk Movements Neck, shoulders, hips: None, normal, Overall Severity Severity of abnormal movements (highest score from questions above): None, normal Incapacitation due to abnormal movements: None, normal Patient's awareness of abnormal movements (rate only patient's report): No Awareness, Dental Status Current problems with teeth and/or dentures?: No Does patient  usually wear dentures?: No  CIWA:    COWS:     Treatment Plan Summary: Daily contact with patient to assess and evaluate symptoms and progress in treatment Medication management  Plan: Continue current plan of care. Encourage patient to attend groups. Plan discharge for tomorrow.  Medical Decision Making Problem Points:  Established problem, stable/improving (1), Review of last therapy session (1) and Review of psycho-social stressors (1) Data Points:  Review of medication regiment & side effects (2)  I certify that inpatient services furnished can reasonably be expected to improve the patient's condition.   Raymond [name] 10/26/2012, 11:08 AM

## 2012-10-26 NOTE — Progress Notes (Signed)
D:  Raymond [name] reports that he slept ok and that his appetite is good.  He rates depression at 4/10 and hopelessness at 4/10.  He appears depressed and sad and states that groups are not helping him.  He states that his depression is related to some ongoing situations and that he knows what he needs to do, but so far has been unable to do this.  He denies SI/HI/AVH at this time and he does contract for safety. A:  Medication administered as ordered.  Safety checks q 15 minutes.  Emotional support provided. R:  Safety maintained on unit.

## 2012-10-26 NOTE — Progress Notes (Signed)
[facility] LCSW Aftercare Discharge Planning Group Note  10/26/2012 9:57 AM  Participation Quality:  Appropriate and Attentive  Affect:  Appropriate, Blunted, Depressed and Flat  Cognitive:  Alert and Appropriate  Insight:  Developing/Improving  Engagement in Group:  Engaged  Modes of Intervention:  Discussion, Exploration and Problem-solving  Summary of Progress/Problems:  Patient attended morning group and received his daily packet. Reports he has been sabotaging his time here by sleeping it away,but reports his main reason for coming was to get away and get some rest/clear his head. He is engaged during group reports he will return home with his wife, mother in law and another cousin from wife's side of the family.   Reports he lives in [place], had seen a doctor in town and would like to follow up. Will arranged follow up for patient. He rates depression and anxiety low at a 4 today. Patient reports he is ready to go and his wife could transport him when dc is ordered.  Nail, Catalina Gravel 10/26/2012, 9:57 AM

## 2012-10-26 NOTE — Progress Notes (Signed)
Psychoeducational Group Note  Date:  10/26/2012 Time: 2000  Group Topic/Focus:  Wrap-Up Group:   The focus of this group is to help patients review their daily goal of treatment and discuss progress on daily workbooks.  Participation Level:  Active  Participation Quality:  Sharing  Affect:  Flat  Cognitive:  Oriented  Insight:  Improving  Engagement in Group:  Improving  Additional Comments:  Patient shared that he planned to focus on the positive today and that worked out well for him.  Alexzandria Massman, Newton Pigg 10/26/2012, 10:04 PM

## 2012-10-26 NOTE — Progress Notes (Signed)
[facility] LCSW Group Therapy  10/26/2012 4:30 PM  Type of Therapy:  Group Therapy  Participation Level:  Did Not Attend  Raymond [name] 10/26/2012, 4:30 PM

## 2012-10-27 MED ORDER — HYDROCHLOROTHIAZIDE 12.5 MG PO CAPS
12.5000 mg | ORAL_CAPSULE | Freq: Every day | ORAL | Status: DC
  Filled 2012-10-27 (×3): qty 7

## 2012-10-27 MED ORDER — NORTRIPTYLINE HCL 10 MG PO CAPS
10.0000 mg | ORAL_CAPSULE | Freq: Every day | ORAL | Status: DC
  Filled 2012-10-27 (×2): qty 7

## 2012-10-27 MED ORDER — DULOXETINE HCL 30 MG PO CPEP
30.0000 mg | ORAL_CAPSULE | Freq: Every day | ORAL | Status: DC
  Filled 2012-10-27 (×2): qty 7

## 2012-10-27 MED ORDER — VERAPAMIL HCL 120 MG PO TABS
120.0000 mg | ORAL_TABLET | Freq: Every day | ORAL | Status: DC
  Filled 2012-10-27 (×2): qty 7

## 2012-10-27 MED ORDER — LOSARTAN POTASSIUM 50 MG PO TABS
120.0000 mg | ORAL_TABLET | Freq: Every day | ORAL | Status: DC
  Filled 2012-10-27 (×2): qty 18

## 2012-10-27 MED ORDER — TOPIRAMATE 100 MG PO TABS
100.0000 mg | ORAL_TABLET | Freq: Every day | ORAL | Status: DC
  Filled 2012-10-27 (×2): qty 7

## 2012-10-27 MED ORDER — CITALOPRAM HYDROBROMIDE 40 MG PO TABS
40.0000 mg | ORAL_TABLET | Freq: Every day | ORAL | Status: DC
  Filled 2012-10-27 (×2): qty 7

## 2012-10-27 NOTE — Progress Notes (Signed)
[facility] Adult Case Management Discharge Plan :  Will you be returning to the same living situation after discharge: Yes,  Patient returning home with wife At discharge, do you have transportation home?:Yes,  Patient to arrange transportation home Do you have the ability to pay for your medications:Yes,  Patient can afford medications  Release of information consent forms completed and in the chart;  Patient's signature needed at discharge.  Patient to Follow up at: Follow-up Information    Follow up with [facility]. (Will call patient at home with start date for [facility] as they are currently closed for the holiday.)    Contact information:   [address] [place], [place]   [address]  [phone]         Patient denies SI/HI:   Yes,  Patient is no longer endorsing SI/HI or thoughts of self harm    Safety Planning and Suicide Prevention discussed:  Yes,  Reviewed with patient individually  [name] 10/27/2012, 9:35 AM

## 2012-10-27 NOTE — [facility] Suicide Risk Assessment (Signed)
Suicide Risk Assessment  Discharge Assessment     Demographic Factors:  Male and Caucasian  Mental Status Per Nursing Assessment::   On Admission:  Suicidal ideation indicated by patient;Self-harm thoughts  Current Mental Status by Physician: Patient alert and oriented to 4. Denies aH/Vh/HI/SI.  Loss Factors: Decrease in vocational status  Historical Factors: NA  Risk Reduction Factors:   Sense of responsibility to family and Positive social support  Continued Clinical Symptoms:  Depression:   Recent sense of peace/wellbeing  Cognitive Features That Contribute To Risk:  Cognitively intact    Suicide Risk:  Minimal: No identifiable suicidal ideation.  Patients presenting with no risk factors but with morbid ruminations; may be classified as minimal risk based on the severity of the depressive symptoms  Discharge Diagnoses:   AXIS I:  Depressive Disorder NOS AXIS II:  Deferred AXIS III:   Past Medical History  Diagnosis Date  . Hypertension   . Migraines   . Sleep apnea   . Depression   . Arthritis    AXIS IV:  occupational problems and other psychosocial or environmental problems AXIS V:  61-70 mild symptoms  Plan Of Care/Follow-up recommendations:  Activity:  as tolerated Diet:  low salt  Is patient on multiple antipsychotic therapies at discharge:  No   Has Patient had three or more failed trials of antipsychotic monotherapy by history:  No  Recommended Plan for Multiple Antipsychotic Therapies: NA  Evalette Montrose 10/27/2012, 9:55 AM

## 2012-10-27 NOTE — Progress Notes (Signed)
D: Patient resting in bed with eyes closed.  Respirations even and unlabored.  Patient appears to be in no apparent distress. A: Staff to monitor Q 15 mins for safety.   R:Patient remains safe on the unit.  

## 2012-10-27 NOTE — Progress Notes (Signed)
D:  Patient discharged to home today.  Denies depressive symptoms or suicidal thoughts.  Reports that sleep and appetite are good.  All belongings retrieved from room and from locker.  A:  Reviewed all discharge instructions, medications, and follow up care.  Patient was given a one week supply of medications from the hospital pharmacy.  Escorted to the search room for belongings then to the front lobby where his wife was waiting to give him a ride home.   R:  Patient verbalizes understanding of all discharge instructions.  States he feels he is ready to leave the hospital.

## 2012-10-27 NOTE — Progress Notes (Signed)
[facility] INPATIENT:  Family/Significant Other Suicide Prevention Education  Suicide Prevention Education:  Education Completed; Raymond [name] "Raymond [name], wife, [phone] has been identified by the patient as the family member/significant other with whom the patient will be residing, and identified as the person(s) who will aid the patient in the event of a mental health crisis (suicidal ideations/suicide attempt).  With written consent from the patient, the family member/significant other has been provided the following suicide prevention education, prior to the and/or following the discharge of the patient.  The suicide prevention education provided includes the following:  Suicide risk factors  Suicide prevention and interventions  National Suicide Hotline telephone number  [facility] assessment telephone number  [facility] Emergency Assistance 911  [facility] and/or Residential Mobile Crisis Unit telephone number  Request made of family/significant other to:  Remove weapons (e.g., guns, rifles, knives), all items previously/currently identified as safety concern.  Wife reports no guns in the home.  Remove drugs/medications (over-the-counter, prescriptions, illicit drugs), all items previously/currently identified as a safety concern.  The family member/significant other verbalizes understanding of the suicide prevention education information provided.  The family member/significant other agrees to remove the items of safety concern listed above.  Raymond [name] 10/27/2012, 9:07 AM

## 2012-10-27 NOTE — Clinical Social Work Note (Signed)
Writer spoke with patient's wife who advised she is okay with patient returning to the home.  She expressed concerns that patient would resume just staying in bed and that he will not follow up with outpatient.  Wife informed we are recommending patient attend [facility] as a means of continued treatment.

## 2012-10-29 NOTE — Discharge Summary (Signed)
Physician Discharge Summary Note  Patient:  Raymond [name] is an 61 y.o., male MRN:  [id number] DOB:  05-14-1951 Patient phone:  [phone] (home)  Patient address:   [address] [place] [address],   Date of Admission:  10/20/2012 Date of Discharge: 10/27/2012  Reason for Admission:  Depression with suicidal ideations  Discharge Diagnoses: Active Problems:  Major depressive disorder, recurrent  Review of Systems  Constitutional: Negative.   HENT: Negative.   Eyes: Negative.   Respiratory: Negative.   Cardiovascular: Negative.   Gastrointestinal: Negative.   Genitourinary: Negative.   Musculoskeletal: Negative.   Skin: Negative.   Neurological: Negative.   Endo/Heme/Allergies: Negative.   Psychiatric/Behavioral: Positive for depression.   Axis Diagnosis:   AXIS I:  Major Depression, Recurrent severe AXIS II:  Deferred AXIS III:   Past Medical History  Diagnosis Date  . Hypertension   . Migraines   . Sleep apnea   . Depression   . Arthritis    AXIS IV:  other psychosocial or environmental problems, problems related to social environment and problems with primary support group AXIS V:  61-70 mild symptoms  Level of Care:  IOP  Hospital Course:   1-Individual and group therapy 2-Coping skill development and implementation for depression 3-Medication management for depression:  Citalopram and Cymbalta for depression, nortriptyline and atarax for sleep, blood pressure medication continued inpatient 4-Psycho-education regarding relapse prevention and self-care 5-Family meeting with the social worker to resolve issues and design a treatment plan to prevent relapse 6-Patient denied suicidal/homicidal ideations and hallucinations at discharge, stable for discharge, Rx given, and Thorvald will continue his care in the Intensive Outpatient Program at [facility]  Consults:  None  Significant Diagnostic Studies:  labs: Completed and reviewed, stable  Discharge Vitals:     Blood pressure 112/78, pulse 68, temperature 97.9 F (36.6 C), temperature source Oral, resp. rate 16, height 5\' 5"  (1.651 m), weight 106.595 kg (235 lb). Body mass index is 39.11 kg/(m^2). Lab Results:   No results found for this or any previous visit (from the past 72 hour(s)).  Physical Findings: AIMS: Facial and Oral Movements Muscles of Facial Expression: None, normal Lips and Perioral Area: None, normal Jaw: None, normal Tongue: None, normal,Extremity Movements Upper (arms, wrists, hands, fingers): None, normal Lower (legs, knees, ankles, toes): None, normal, Trunk Movements Neck, shoulders, hips: None, normal, Overall Severity Severity of abnormal movements (highest score from questions above): None, normal Incapacitation due to abnormal movements: None, normal Patient's awareness of abnormal movements (rate only patient's report): No Awareness, Dental Status Current problems with teeth and/or dentures?: No Does patient usually wear dentures?: No  CIWA:    COWS:     Psychiatric Specialty Exam: See Psychiatric Specialty Exam and Suicide Risk Assessment completed by Attending Physician prior to discharge.  Discharge destination:  Home  Is patient on multiple antipsychotic therapies at discharge:  No   Has Patient had three or more failed trials of antipsychotic monotherapy by history:  No  Recommended Plan for Multiple Antipsychotic Therapies:  N/A  Discharge Orders    Future Orders Please Complete By Expires   Diet - low sodium heart healthy      Activity as tolerated - No restrictions          Medication List     As of 10/29/2012 12:13 PM    STOP taking these medications         ibuprofen 200 MG tablet   Commonly known as: ADVIL,MOTRIN  LATUDA 20 MG Tabs   Generic drug: Lurasidone HCl      TAKE these medications      Indication    citalopram 40 MG tablet   Commonly known as: CELEXA   Take 1 tablet (40 mg total) by mouth daily.    Indication:  Depression      DULoxetine 30 MG capsule   Commonly known as: CYMBALTA   Take 1 capsule (30 mg total) by mouth daily.    Indication: Major Depressive Disorder      hydrochlorothiazide 12.5 MG capsule   Commonly known as: MICROZIDE   Take 1 capsule (12.5 mg total) by mouth daily.    Indication: Edema, High Blood Pressure      hydrOXYzine 50 MG tablet   Commonly known as: ATARAX/VISTARIL   Take 1 tablet (50 mg total) by mouth at bedtime and may repeat dose one time if needed.    Indication: anxiety      losartan 25 MG tablet   Commonly known as: COZAAR   Take 5 tablets (125 mg total) by mouth daily.    Indication: High Blood Pressure      naproxen 500 MG tablet   Commonly known as: NAPROSYN   Take 1 tablet (500 mg total) by mouth 3 (three) times daily as needed.    Indication: Mild to Moderate Pain      nortriptyline 10 MG capsule   Commonly known as: PAMELOR   Take 1 capsule (10 mg total) by mouth at bedtime.    Indication: Depression, Trouble Sleeping      topiramate 100 MG tablet   Commonly known as: TOPAMAX   Take 1 tablet (100 mg total) by mouth at bedtime.    Indication: mood stabilization      verapamil 120 MG tablet   Commonly known as: CALAN   Take 1 tablet (120 mg total) by mouth daily.    Indication: High Blood Pressure of Unknown Cause        Follow-up recommendations:  Activity as tolerated, low-sodium heart healthy diet  Comments:  Patient will follow-up in Intensive Outpatient at [facility]  Total Discharge Time:  Greater than 30 minutes  Signed: Nanine Means, PMH-NP 10/29/2012, 12:13 PM

## 2012-10-30 NOTE — Discharge Summary (Signed)
Reviewed

## 2012-11-02 NOTE — Progress Notes (Signed)
Patient Discharge Instructions:  Next Level Care Provider Has Access to the EMR, 11/02/12 Records provided to [facility] via [facility]/Epic Access  Jerelene Redden, 11/02/2012, 1:56 PM

## 2012-11-05 NOTE — Clinical Social Work Note (Signed)
Writer returned call to patient to advise he can start MH-IOP on Wednesday 11/11/12 at 8:45 a.m.  Patient reports he is doing well be believe he can benefit from IOP.

## 2012-11-11 ENCOUNTER — Other Ambulatory Visit (HOSPITAL_COMMUNITY): Payer: 59 | Attending: Psychiatry

## 2012-11-12 ENCOUNTER — Other Ambulatory Visit (HOSPITAL_COMMUNITY): Payer: Self-pay

## 2012-11-13 ENCOUNTER — Ambulatory Visit (HOSPITAL_COMMUNITY): Payer: Self-pay | Admitting: Psychiatry

## 2012-11-13 ENCOUNTER — Other Ambulatory Visit (HOSPITAL_COMMUNITY): Payer: Self-pay

## 2012-11-16 ENCOUNTER — Other Ambulatory Visit (HOSPITAL_COMMUNITY): Payer: Self-pay

## 2012-11-17 ENCOUNTER — Ambulatory Visit (INDEPENDENT_AMBULATORY_CARE_PROVIDER_SITE_OTHER): Payer: 59 | Admitting: Psychology

## 2012-11-17 ENCOUNTER — Other Ambulatory Visit (HOSPITAL_COMMUNITY): Payer: Self-pay

## 2012-11-17 DIAGNOSIS — F331 Major depressive disorder, recurrent, moderate: Secondary | ICD-10-CM

## 2012-11-18 ENCOUNTER — Other Ambulatory Visit (HOSPITAL_COMMUNITY): Payer: Self-pay

## 2012-11-19 ENCOUNTER — Other Ambulatory Visit (HOSPITAL_COMMUNITY): Payer: Self-pay

## 2012-11-20 ENCOUNTER — Other Ambulatory Visit (HOSPITAL_COMMUNITY): Payer: Self-pay

## 2012-11-23 ENCOUNTER — Other Ambulatory Visit (HOSPITAL_COMMUNITY): Payer: Self-pay

## 2012-11-24 ENCOUNTER — Other Ambulatory Visit (HOSPITAL_COMMUNITY): Payer: Self-pay

## 2012-11-25 ENCOUNTER — Other Ambulatory Visit (HOSPITAL_COMMUNITY): Payer: Self-pay

## 2012-11-26 ENCOUNTER — Ambulatory Visit (INDEPENDENT_AMBULATORY_CARE_PROVIDER_SITE_OTHER): Payer: 59 | Admitting: Psychology

## 2012-11-26 ENCOUNTER — Other Ambulatory Visit (HOSPITAL_COMMUNITY): Payer: Self-pay

## 2012-11-26 DIAGNOSIS — F331 Major depressive disorder, recurrent, moderate: Secondary | ICD-10-CM

## 2012-11-27 ENCOUNTER — Other Ambulatory Visit (HOSPITAL_COMMUNITY): Payer: Self-pay

## 2012-11-30 ENCOUNTER — Other Ambulatory Visit (HOSPITAL_COMMUNITY): Payer: Self-pay

## 2012-12-01 ENCOUNTER — Other Ambulatory Visit (HOSPITAL_COMMUNITY): Payer: Self-pay

## 2012-12-02 ENCOUNTER — Other Ambulatory Visit (HOSPITAL_COMMUNITY): Payer: Self-pay

## 2012-12-03 ENCOUNTER — Other Ambulatory Visit (HOSPITAL_COMMUNITY): Payer: Self-pay

## 2012-12-04 ENCOUNTER — Other Ambulatory Visit (HOSPITAL_COMMUNITY): Payer: Self-pay

## 2012-12-04 ENCOUNTER — Ambulatory Visit (INDEPENDENT_AMBULATORY_CARE_PROVIDER_SITE_OTHER): Payer: 59 | Admitting: Psychology

## 2012-12-04 DIAGNOSIS — F331 Major depressive disorder, recurrent, moderate: Secondary | ICD-10-CM

## 2012-12-07 ENCOUNTER — Other Ambulatory Visit (HOSPITAL_COMMUNITY): Payer: Self-pay

## 2012-12-08 ENCOUNTER — Ambulatory Visit (INDEPENDENT_AMBULATORY_CARE_PROVIDER_SITE_OTHER): Payer: 59 | Admitting: Psychiatry

## 2012-12-08 ENCOUNTER — Other Ambulatory Visit (HOSPITAL_COMMUNITY): Payer: Self-pay

## 2012-12-08 ENCOUNTER — Encounter (HOSPITAL_COMMUNITY): Payer: Self-pay | Admitting: Psychiatry

## 2012-12-08 VITALS — BP 128/83 | HR 83 | Ht 65.0 in | Wt 237.0 lb

## 2012-12-08 DIAGNOSIS — F329 Major depressive disorder, single episode, unspecified: Secondary | ICD-10-CM

## 2012-12-08 DIAGNOSIS — F339 Major depressive disorder, recurrent, unspecified: Secondary | ICD-10-CM

## 2012-12-08 MED ORDER — CITALOPRAM HYDROBROMIDE 40 MG PO TABS: 40.0000 mg | ORAL_TABLET | Freq: Every day | ORAL | Status: DC

## 2012-12-08 MED ORDER — NORTRIPTYLINE HCL 10 MG PO CAPS: ORAL_CAPSULE | ORAL | Status: DC

## 2012-12-08 NOTE — Progress Notes (Signed)
Patient ID: Raymond [name], male   DOB: [birth year], 62 y.o.   MRN: [id number] Chief complaint I was admitted at behavioral Center for depression.  I need my medication.  History presenting illness Patient is 62 year old Caucasian recently retired married man who was referred from inpatient psychiatric services for continuity of care.  Patient was admitted before Christmas after feeling very depressed , having suicidal thoughts and unable to function.  He was feeling hopeless helpless and does not want to do anything.  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  He was initially referred for intensive outpatient program but later his therapist recommended inpatient services.  Patient was discharged on Celexa 40 mg and nortriptyline 10 mg at bedtime.  His Cymbalta was reduced from 60 mg twice a day to 30 mg daily.  Patient told the plan wants to completely come off from Cymbalta.  Patient is doing better on his current medication.  He start seeing therapist regularly.  Patient told that his stressors remain the same but he is handing better.  He has picked up some new project and keeping himself busy.  He collects children boys and he is very involved with his grandchildren.  Patient admitted some time he has difficulty sleeping and feels isolated and withdrawn but denies any active or passive suicidal thoughts or homicidal thoughts.  He admitted decreased energy lack of motivation and sometime difficulty to wake up in the morning because of depression but overall he is doing better from the past.  He denies any agitation anger mood swing.  He denies any side effects of medication.  He wants to come off from Cymbalta.  He wants to continue his therapy with Dr. Rexene Agent .    Past psychiatric history Patient has long history of depression which he believe do to a difficult marriage.  He's  been married for 40 years.  But he believed that he is struggling in his marriage for past 20 years.  He has taken Prozac, Paxil, Zoloft, Abilify and Cymbalta from his primary care physician in [facility].  Patient also endorse seeing psychiatrist once at [facility] but did not continue with psychiatrist.  He has one suicidal attempt 2 years ago when he took overdose on his hypertensive medication and admitted at [facility].  His last psychiatric admission was December 2013 at [facility].  Patient denies any history of mania psychosis hallucination or violence.  Medical history Patient has history of hypertension, sleep apnea migraine headache and obesity.  His primary care physician is in [facility].    Family history Patient denies any family history of psychiatric illness.  Psychosocial history Patient was born in [place] and raised in [place].  His mother lives in [place]. Patient has no contact with his father since 62.  Patient has been married for 40 years.  Patient admitted that for past 20 years his marriage has been difficult.  He has 2 children and 3 grandchildren.  Patient denies any history of verbal sexual emotional or physical abuse.  Patient lives with his wife and mother-in-law.   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   Alcohol and substance use history Patient denies any history of illegal substance use or drinking alcohol.  Review of Systems  Neurological: Positive  for headaches. Negative for dizziness, tingling, tremors, sensory change, speech change, focal weakness, seizures and loss of consciousness.  Psychiatric/Behavioral: Positive for depression. Negative for suicidal ideas and substance abuse. The patient is nervous/anxious and has insomnia.    Mental status examination Patient is obese male who is fairly dressed and  groomed.  He has a beard.  His calm cooperative and maintained good eye contact.  His his speech is soft clear and coherent with normal tone volume.  His thought processes slow but logical linear and goal-directed.  He described his mood is depressed and his affect is mood appropriate.  He denies any active or passive suicidal thoughts or homicidal thoughts.  He denies any auditory or visual hallucination.  There were no paranoia, delusion obsession present at this time.  His fund of knowledge is adequate.  There were no tremors or shakes present.  There were no flight of ideas or loose association.  His attention and concentration is good.  He's alert and wanted x3.  His insight judgment and impulse control is okay.  Assessment Axis I Maj. depressive disorder, recurrent Axis II deferred Axis III see medical history Axis IV mild to moderate Axis V 55-65  Plan I reviewed his symptoms, history, discharge summary from the hospital, medication and response to the medication.  Patient is showing improvement on Celexa in a small dose of nortriptyline.  He like to stop Cymbalta which he discussed when he was inpatient.  I agree with the plan however I recommend to try nortriptyline to 20 mg to target his insomnia anxiety and depressive symptoms.  Patient is tolerating this medication without any side effects.  I encourage him to see therapist regularly for coping and social skills.  I encourage him to continue his medication and recommend to call us if he has any question or concern if he feel worsening of the symptom.  We discussed safety plan that anytime having active suicidal thoughts or homicidal thoughts and he need to call 11 go to local emergency room.  I will discontinue Cymbalta.  A new prescription of Celexa 40 mg and nortriptyline 10 mg 2 capsule at bedtime is given.  Time spent 60 minutes.  I will see him again in 2-3 weeks.  Portion of this note is generated with voice dictation software and may  contain typographical error.

## 2012-12-09 ENCOUNTER — Other Ambulatory Visit (HOSPITAL_COMMUNITY): Payer: Self-pay

## 2012-12-10 ENCOUNTER — Other Ambulatory Visit (HOSPITAL_COMMUNITY): Payer: Self-pay

## 2012-12-11 ENCOUNTER — Other Ambulatory Visit (HOSPITAL_COMMUNITY): Payer: Self-pay

## 2012-12-21 ENCOUNTER — Ambulatory Visit (INDEPENDENT_AMBULATORY_CARE_PROVIDER_SITE_OTHER): Payer: 59 | Admitting: Psychology

## 2012-12-21 DIAGNOSIS — F331 Major depressive disorder, recurrent, moderate: Secondary | ICD-10-CM

## 2012-12-29 ENCOUNTER — Encounter (HOSPITAL_COMMUNITY): Payer: Self-pay | Admitting: Psychiatry

## 2012-12-29 ENCOUNTER — Ambulatory Visit (INDEPENDENT_AMBULATORY_CARE_PROVIDER_SITE_OTHER): Payer: 59 | Admitting: Psychiatry

## 2012-12-29 VITALS — BP 119/84 | HR 65 | Wt 240.0 lb

## 2012-12-29 DIAGNOSIS — F339 Major depressive disorder, recurrent, unspecified: Secondary | ICD-10-CM

## 2012-12-29 DIAGNOSIS — F329 Major depressive disorder, single episode, unspecified: Secondary | ICD-10-CM

## 2012-12-29 MED ORDER — NORTRIPTYLINE HCL 25 MG PO CAPS: 25.0000 mg | ORAL_CAPSULE | Freq: Every day | ORAL | Status: DC

## 2012-12-29 MED ORDER — CITALOPRAM HYDROBROMIDE 40 MG PO TABS: 40.0000 mg | ORAL_TABLET | Freq: Every day | ORAL | Status: DC

## 2012-12-29 MED ORDER — HYDROXYZINE HCL 50 MG PO TABS: 50.0000 mg | ORAL_TABLET | Freq: Every day | ORAL | Status: DC

## 2012-12-29 NOTE — Progress Notes (Signed)
[facility] [id number] Progress Note  Raymond [name] [id number] 61 y.o.  12/29/2012 3:20 PM  Chief Complaint:  I am doing better.  History of Present Illness: Patient is 62 year old Caucasian retired man who came for his followup appointment.  Patient was discharged from behavioral Center last year.  He was seen in this office first time on February 4.  His medications are adjusted.  He is not taking Cymbalta and I increase his nortriptyline to 20 mg along with Celexa 40 mg.  He is doing better on his medication.  He continues to have difficulty adjusting his sleep cycle.  He used to work night shift and now trying to get sleep at night.  He is taking Vistaril which is helping his sleep.  Since increase nortriptyline he feel more energy during the day.  He's also seeing Dr. Dellia Cloud for counseling.  He is more social and less depressed and less anxious.  He denies any side effects of medication.  He is trying to keep himself busy in housing project.  He's not drinking or using any illegal substance.  He is using his CPAP machine regularly.  Suicidal Ideation: No Plan Formed: No Patient has means to carry out plan: No  Homicidal Ideation: No Plan Formed: No Patient has means to carry out plan: No  Review of Systems: Psychiatric: Agitation: No Hallucination: No Depressed Mood: Yes Insomnia: Yes Hypersomnia: No Altered Concentration: No Feels Worthless: No Grandiose Ideas: No Belief In Special Powers: No New/Increased Substance Abuse: No Compulsions: No  Neurologic: Headache: Yes Seizure: No Paresthesias: No  Past psychiatric history. Patient has a long history of depression which he believe due to difficult marriage.  His been married for 40 years but he admitted Vistaril in his marriage for past 20 years.  He has taken in the past Prozac, Paxil, Zoloft, Abilify and Cymbalta from his primary care physician.  Patient has at least one suicidal attempt 2011 when he took  overdose on his hypertensive medication and admitted at [facility].  His last psychiatric admission was December 2013 at [facility] when he was feeling very depressed , having suicidal thoughts and unable to function.  He was taking Cymbalta 60 mg given by primary care physician.    Medical history. Patient has history of hypertension, obesity, sleep apnea, migraine headache.  His primary care physician is in [facility].  Family and Social History:  Patient denies any family history of psychiatric illness.  He was born in [place] and raised in [place].  His mother lives in [place].  Patient has no contact with his father since 39.  He has 2 children and 3 grandchildren.  Patient denies any history of verbal sexual or emotional abuse.  Patient lives with his wife and mother-in-law.    Outpatient Encounter Prescriptions as of 12/29/2012  Medication Sig Dispense Refill  . citalopram (CELEXA) 40 MG tablet Take 1 tablet (40 mg total) by mouth daily.  30 tablet  1  . hydrOXYzine (ATARAX/VISTARIL) 50 MG tablet Take 1 tablet (50 mg total) by mouth at bedtime.  30 tablet  1  . losartan-hydrochlorothiazide (HYZAAR) 100-25 MG per tablet Take 1 tablet by mouth daily.      . naproxen (NAPROSYN) 500 MG tablet Take 1 tablet (500 mg total) by mouth 3 (three) times daily as needed.  30 tablet  0  . nortriptyline (PAMELOR) 25 MG capsule Take 1 capsule (25 mg total) by mouth at bedtime.  30 capsule  1  . topiramate (TOPAMAX) 100 MG tablet Take 1 tablet (100 mg total) by mouth at bedtime.  30 tablet  0  . verapamil (CALAN) 120 MG tablet Take 1 tablet (120 mg total) by mouth daily.  30 tablet  0  . [DISCONTINUED] citalopram (CELEXA) 40 MG tablet Take 1 tablet (40 mg total) by mouth daily.  30 tablet  0  . [DISCONTINUED] hydrOXYzine (ATARAX/VISTARIL) 50 MG tablet Take 1 tablet (50 mg total) by mouth at bedtime and may repeat dose one time if needed.  30 tablet   0  . [DISCONTINUED] nortriptyline (PAMELOR) 10 MG capsule Take 2 cap at bed time  60 capsule  0   No facility-administered encounter medications on file as of 12/29/2012.    Past Psychiatric History/Hospitalization(s): Anxiety: Yes Bipolar Disorder: No Depression: Yes Mania: No Psychosis: No Schizophrenia: No Personality Disorder: No Hospitalization for psychiatric illness: Yes History of Electroconvulsive Shock Therapy: No Prior Suicide Attempts: Yes  Physical Exam: Constitutional:  BP 119/84  Pulse 65  Wt 240 lb (108.863 kg)  BMI 39.94 kg/m2  General Appearance: well nourished and obese  Musculoskeletal: Strength & Muscle Tone: within normal limits Gait & Station: normal Patient leans: N/A  Psychiatric: Speech (describe rate, volume, coherence, spontaneity, and abnormalities if any): Clear and coherent with normal tone and volume.  Thought Process (describe rate, content, abstract reasoning, and computation): Logical organized and goal-directed.  Associations: Coherent and Relevant  Thoughts: normal  Mental Status: Orientation: oriented to person, place, time/date and situation Mood & Affect: depressed affect and anxiety Attention Span & Concentration: Fair  Medical Decision Making (Choose Three): Established Problem, Stable/Improving (1), Review of Psycho-Social Stressors (1), Review of Last Therapy Session (1), Review of Medication Regimen & Side Effects (2) and Review of New Medication or Change in Dosage (2)  Assessment: Axis I: Maj. depressive disorder recurrent  Axis II: Deferred  Axis III: See medical history  Axis IV: Mild to moderate  Axis V: 60-65   Plan: I review his medication, psychosocial stressor and response of medication.  Patient is doing better from the past but is still has insomnia and Rezulin anxiety.  I recommend to increase nortriptyline to 25 mg at bedtime.  Recommend to continue Celexa and Vistaril at present does.  He will see  therapist Dr. Dellia Cloud for counseling.  Recommend to call us if his any question or concern he feel worsening of the symptom.  I will see him again in 2 months.  Time spent 25 minutes.  More than 50% of the time spent and psychoeducation counseling and coordination of care.  Thea Holshouser T., MD 12/29/2012

## 2013-01-04 ENCOUNTER — Ambulatory Visit: Payer: 59 | Admitting: Psychology

## 2013-01-11 ENCOUNTER — Ambulatory Visit (INDEPENDENT_AMBULATORY_CARE_PROVIDER_SITE_OTHER): Payer: 59 | Admitting: Psychology

## 2013-01-11 DIAGNOSIS — F331 Major depressive disorder, recurrent, moderate: Secondary | ICD-10-CM

## 2013-01-18 ENCOUNTER — Ambulatory Visit: Payer: 59 | Admitting: Psychology

## 2013-01-27 ENCOUNTER — Ambulatory Visit (INDEPENDENT_AMBULATORY_CARE_PROVIDER_SITE_OTHER): Payer: 59 | Admitting: Psychology

## 2013-01-27 DIAGNOSIS — F331 Major depressive disorder, recurrent, moderate: Secondary | ICD-10-CM

## 2013-02-09 ENCOUNTER — Ambulatory Visit (INDEPENDENT_AMBULATORY_CARE_PROVIDER_SITE_OTHER): Payer: 59 | Admitting: Psychology

## 2013-02-09 DIAGNOSIS — F331 Major depressive disorder, recurrent, moderate: Secondary | ICD-10-CM

## 2013-02-25 ENCOUNTER — Ambulatory Visit (INDEPENDENT_AMBULATORY_CARE_PROVIDER_SITE_OTHER): Payer: 59 | Admitting: Psychology

## 2013-02-25 DIAGNOSIS — F331 Major depressive disorder, recurrent, moderate: Secondary | ICD-10-CM

## 2013-02-26 ENCOUNTER — Ambulatory Visit (INDEPENDENT_AMBULATORY_CARE_PROVIDER_SITE_OTHER): Payer: 59 | Admitting: Psychiatry

## 2013-02-26 ENCOUNTER — Encounter (HOSPITAL_COMMUNITY): Payer: Self-pay | Admitting: Psychiatry

## 2013-02-26 VITALS — BP 113/66 | HR 68 | Wt 228.0 lb

## 2013-02-26 DIAGNOSIS — F329 Major depressive disorder, single episode, unspecified: Secondary | ICD-10-CM

## 2013-02-26 DIAGNOSIS — F339 Major depressive disorder, recurrent, unspecified: Secondary | ICD-10-CM

## 2013-02-26 MED ORDER — NORTRIPTYLINE HCL 25 MG PO CAPS: 25.0000 mg | ORAL_CAPSULE | Freq: Every day | ORAL | Status: DC

## 2013-02-26 MED ORDER — HYDROXYZINE HCL 50 MG PO TABS: 50.0000 mg | ORAL_TABLET | Freq: Every day | ORAL | Status: DC

## 2013-02-26 MED ORDER — CITALOPRAM HYDROBROMIDE 40 MG PO TABS: 40.0000 mg | ORAL_TABLET | Freq: Every day | ORAL | Status: DC

## 2013-02-26 NOTE — Progress Notes (Signed)
[facility] [id number] Progress Note  RJ PEDROSA [id number] 62 y.o.  02/26/2013 10:47 AM  Chief Complaint:  Medication management and followup.  History of Present Illness: Patient is 62 year old Caucasian retired man who came for his followup appointment.  On his last visit we had increased nortriptyline to 25 mg.  Patient is doing better on increased dose of nortriptyline.  He is sleeping better.  There are times when he does not require Vistaril.  He is also seeing Dr. Dellia Cloud every 2 weeks.  He is happy that he lost weight 12 ponds since last visit.  He is more active and spend a lot of time in backyard.  He is trying to keep himself busy.  He is using CPAP machine.  He denies any anger , crying spells, anxiety or any panic attack.  He wants to continue his current psychiatric medication.  He is less depressed and less anxious from the past.  He denies any active or passive suicidal thoughts.  He's not drinking or using any illegal substance.  He is using his CPAP machine regularly.  Suicidal Ideation: No Plan Formed: No Patient has means to carry out plan: No  Homicidal Ideation: No Plan Formed: No Patient has means to carry out plan: No  Review of Systems  Constitutional:       Weight loss  Respiratory:       Seasonal allergies.  Musculoskeletal: Positive for back pain.  Neurological: Positive for headaches. Negative for dizziness, tingling, tremors, sensory change, speech change, focal weakness, seizures and loss of consciousness.   Psychiatric: Agitation: No Hallucination: No Depressed Mood: No Insomnia: No Hypersomnia: No Altered Concentration: No Feels Worthless: No Grandiose Ideas: No Belief In Special Powers: No New/Increased Substance Abuse: No Compulsions: No  Neurologic: Headache: Yes Seizure: No Paresthesias: No  Past psychiatric history. Patient has a long history of depression which he believe due to difficult marriage.  His been married for  40 years but he admitted Vistaril in his marriage for past 20 years.  He has taken in the past Prozac, Paxil, Zoloft, Abilify and Cymbalta from his primary care physician.  Patient has at least one suicidal attempt 2011 when he took overdose on his hypertensive medication and admitted at [facility].  His last psychiatric admission was December 2013 at [facility] when he was feeling very depressed , having suicidal thoughts and unable to function.  He was taking Cymbalta 60 mg given by primary care physician.    Medical history. Patient has history of hypertension, obesity, sleep apnea, migraine headache.  His primary care physician is in [facility].  Family and Social History:  Patient denies any family history of psychiatric illness.  He was born in [place] and raised in [place].  His mother lives in [place].  Patient has no contact with his father since 21.  He has 2 children and 3 grandchildren.  Patient denies any history of verbal sexual or emotional abuse.  Patient lives with his wife and mother-in-law.    Outpatient Encounter Prescriptions as of 02/26/2013  Medication Sig Dispense Refill  . citalopram (CELEXA) 40 MG tablet Take 1 tablet (40 mg total) by mouth daily.  90 tablet  0  . hydrOXYzine (ATARAX/VISTARIL) 50 MG tablet Take 1 tablet (50 mg total) by mouth at bedtime.  90 tablet  0  . losartan-hydrochlorothiazide (HYZAAR) 100-25 MG per tablet Take 1 tablet by mouth daily.      Marland Kitchen  naproxen (NAPROSYN) 500 MG tablet Take 1 tablet (500 mg total) by mouth 3 (three) times daily as needed.  30 tablet  0  . nortriptyline (PAMELOR) 25 MG capsule Take 1 capsule (25 mg total) by mouth at bedtime.  90 capsule  0  . topiramate (TOPAMAX) 100 MG tablet Take 1 tablet (100 mg total) by mouth at bedtime.  30 tablet  0  . verapamil (CALAN) 120 MG tablet Take 1 tablet (120 mg total) by mouth daily.  30 tablet  0  . [DISCONTINUED] citalopram (CELEXA)  40 MG tablet Take 1 tablet (40 mg total) by mouth daily.  30 tablet  1  . [DISCONTINUED] hydrOXYzine (ATARAX/VISTARIL) 50 MG tablet Take 1 tablet (50 mg total) by mouth at bedtime.  30 tablet  1  . [DISCONTINUED] nortriptyline (PAMELOR) 25 MG capsule Take 1 capsule (25 mg total) by mouth at bedtime.  30 capsule  1   No facility-administered encounter medications on file as of 02/26/2013.    Past Psychiatric History/Hospitalization(s): Anxiety: Yes Bipolar Disorder: No Depression: Yes Mania: No Psychosis: No Schizophrenia: No Personality Disorder: No Hospitalization for psychiatric illness: Yes History of Electroconvulsive Shock Therapy: No Prior Suicide Attempts: Yes  Physical Exam: Constitutional:  BP 113/66  Pulse 68  Wt 228 lb (103.42 kg)  BMI 37.94 kg/m2  General Appearance: well nourished and obese  Musculoskeletal: Strength & Muscle Tone: within normal limits Gait & Station: normal Patient leans: N/A  Psychiatric: Speech (describe rate, volume, coherence, spontaneity, and abnormalities if any): Clear and coherent with normal tone and volume.  Thought Process (describe rate, content, abstract reasoning, and computation): Logical organized and goal-directed.  Associations: Coherent and Relevant  Thoughts: normal  Mental Status: Orientation: oriented to person, place, time/date and situation Mood & Affect: depressed affect and anxiety Attention Span & Concentration: Fair  Medical Decision Making (Choose Three): Established Problem, Stable/Improving (1), Review of Psycho-Social Stressors (1), Review of Last Therapy Session (1) and Review of Medication Regimen & Side Effects (2)  Assessment: Axis I: Maj. depressive disorder recurrent  Axis II: Deferred  Axis III: See medical history  Axis IV: Mild to moderate  Axis V: 60-65   Plan:  I will continue Celexa 40 mg daily, nortriptyline 25 mg at bedtime and hydroxyzine 50 mg for anxiety and insomnia.  Patient  does not have any side effects .  He will continue counseling with Dr. Rexene Agent .  It is combat explained to do recommend to call us back if he is a question or concern for worsening of the symptom.  I will see him again in 3 months.  90 day prescription of medications are given.  ARFEEN,SYED T., MD 02/26/2013

## 2013-03-11 ENCOUNTER — Ambulatory Visit (INDEPENDENT_AMBULATORY_CARE_PROVIDER_SITE_OTHER): Payer: 59 | Admitting: Psychology

## 2013-03-11 DIAGNOSIS — F331 Major depressive disorder, recurrent, moderate: Secondary | ICD-10-CM

## 2013-03-30 ENCOUNTER — Ambulatory Visit: Payer: 59 | Admitting: Psychology

## 2013-05-25 ENCOUNTER — Ambulatory Visit (HOSPITAL_COMMUNITY): Payer: Self-pay | Admitting: Psychiatry

## 2013-06-02 ENCOUNTER — Encounter (HOSPITAL_COMMUNITY): Payer: Self-pay | Admitting: Psychiatry

## 2013-06-02 ENCOUNTER — Ambulatory Visit (INDEPENDENT_AMBULATORY_CARE_PROVIDER_SITE_OTHER): Payer: 59 | Admitting: Psychiatry

## 2013-06-02 VITALS — BP 132/78 | HR 70 | Ht 65.0 in | Wt 228.2 lb

## 2013-06-02 DIAGNOSIS — F329 Major depressive disorder, single episode, unspecified: Secondary | ICD-10-CM

## 2013-06-02 DIAGNOSIS — F339 Major depressive disorder, recurrent, unspecified: Secondary | ICD-10-CM

## 2013-06-02 MED ORDER — NORTRIPTYLINE HCL 25 MG PO CAPS: 25.0000 mg | ORAL_CAPSULE | Freq: Every day | ORAL | Status: DC

## 2013-06-02 MED ORDER — CITALOPRAM HYDROBROMIDE 40 MG PO TABS: 40.0000 mg | ORAL_TABLET | Freq: Every day | ORAL | Status: DC

## 2013-06-02 NOTE — Progress Notes (Signed)
[facility] [address] Progress Note  [name] [id number] 61 y.o.  06/02/2013 10:59 AM  Chief Complaint:  Medication management and followup.  History of Present Illness: Patient is 62 year old Caucasian retired man who came for his followup appointment.  Vision is doing better on his current medication.  Recently he had a vacation with his family and went to [place].  Patient is sleeping better.  He rarely takes Vistaril .  He is seeing therapist regularly.  He denies any recent crying spells , panic attack or any anger.  He is using his CPAP machine.  He continues to have chronic back pain and he takes Naprosyn as needed.  He is also taking Topamax for his migraine headache which is prescribed by his primary care physician.  Patient denies any suicidal thoughts.  He is not drinking or using any illegal substance.  He likes to continue his current psychiatric medication.  Suicidal Ideation: No Plan Formed: No Patient has means to carry out plan: No  Homicidal Ideation: No Plan Formed: No Patient has means to carry out plan: No  Review of Systems  Constitutional:       Weight loss  Respiratory:       Seasonal allergies.  Musculoskeletal: Positive for back pain.  Neurological: Positive for headaches. Negative for dizziness, tingling, tremors, sensory change, speech change, focal weakness, seizures and loss of consciousness.   Psychiatric: Agitation: No Hallucination: No Depressed Mood: No Insomnia: No Hypersomnia: No Altered Concentration: No Feels Worthless: No Grandiose Ideas: No Belief In Special Powers: No New/Increased Substance Abuse: No Compulsions: No  Neurologic: Headache: Yes Seizure: No Paresthesias: No  Past psychiatric history. Patient has a long history of depression which he believe due to difficult marriage.  His been married for 40 years but he admitted Vistaril in his marriage for past 20 years.  He has taken in the past Prozac, Paxil,  Zoloft, Abilify and Cymbalta from his primary care physician.  Patient has at least one suicidal attempt 2011 when he took overdose on his hypertensive medication and admitted at [facility].  His last psychiatric admission was December 2013 at [facility] when he was feeling very depressed , having suicidal thoughts and unable to function.  He was taking Cymbalta 60 mg given by primary care physician.    Medical history. Patient has history of hypertension, obesity, sleep apnea, migraine headache.  His primary care physician is in [facility].  Family and Social History:  Patient denies any family history of psychiatric illness.  He was born in [place] and raised in [place].  His mother lives in [place].  Patient has no contact with his father since 12.  He has 2 children and 3 grandchildren.  Patient denies any history of verbal sexual or emotional abuse.  Patient lives with his wife and mother-in-law.    Outpatient Encounter Prescriptions as of 06/02/2013  Medication Sig Dispense Refill  . citalopram (CELEXA) 40 MG tablet Take 1 tablet (40 mg total) by mouth daily.  90 tablet  0  . hydrOXYzine (ATARAX/VISTARIL) 50 MG tablet Take 1 tablet (50 mg total) by mouth at bedtime.  90 tablet  0  . losartan-hydrochlorothiazide (HYZAAR) 100-25 MG per tablet Take 1 tablet by mouth daily.      . naproxen (NAPROSYN) 500 MG tablet Take 1 tablet (500 mg total) by mouth 3 (three) times daily as needed.  30 tablet  0  . nortriptyline (PAMELOR) 25 MG  capsule Take 1 capsule (25 mg total) by mouth at bedtime.  90 capsule  0  . topiramate (TOPAMAX) 100 MG tablet Take 1 tablet (100 mg total) by mouth at bedtime.  30 tablet  0  . verapamil (CALAN) 120 MG tablet Take 1 tablet (120 mg total) by mouth daily.  30 tablet  0  . [DISCONTINUED] citalopram (CELEXA) 40 MG tablet Take 1 tablet (40 mg total) by mouth daily.  90 tablet  0  . [DISCONTINUED] nortriptyline  (PAMELOR) 25 MG capsule Take 1 capsule (25 mg total) by mouth at bedtime.  90 capsule  0   No facility-administered encounter medications on file as of 06/02/2013.    Past Psychiatric History/Hospitalization(s): Anxiety: Yes Bipolar Disorder: No Depression: Yes Mania: No Psychosis: No Schizophrenia: No Personality Disorder: No Hospitalization for psychiatric illness: Yes History of Electroconvulsive Shock Therapy: No Prior Suicide Attempts: Yes  Physical Exam: Constitutional:  BP 132/78  Pulse 70  Ht 5\' 5"  (1.651 m)  Wt 228 lb 3.2 oz (103.511 kg)  BMI 37.97 kg/m2  General Appearance: well nourished and obese  Musculoskeletal: Strength & Muscle Tone: within normal limits Gait & Station: normal Patient leans: N/A  Psychiatric: Speech (describe rate, volume, coherence, spontaneity, and abnormalities if any): Clear and coherent with normal tone and volume.  Thought Process (describe rate, content, abstract reasoning, and computation): Logical organized and goal-directed.  Associations: Coherent and Relevant  Thoughts: normal  Mental Status: Orientation: oriented to person, place, time/date and situation Mood & Affect: depressed affect and anxiety Attention Span & Concentration: Fair  Medical Decision Making (Choose Three): Established Problem, Stable/Improving (1), Review of Psycho-Social Stressors (1), Review of Last Therapy Session (1) and Review of Medication Regimen & Side Effects (2)  Assessment: Axis I: Maj. depressive disorder recurrent  Axis II: Deferred  Axis III: See medical history  Axis IV: Mild to moderate  Axis V: 60-65   Plan:  I will continue Celexa 40 mg daily and nortriptyline 25 mg at bedtime.  Patient does not need hydroxyzine 50 mg at this time.  He has refills remaining .  Recommend to see his therapist Dr. Paulita Fujita .  I also explained risks and benefits of medication.  Recommended to call us back if he has any question or concern for  feel worsening of the symptom.  I will see him again in 3 months. A 90 day prescription of medications are given.  Tanja Gift T., MD 06/02/2013

## 2013-09-03 ENCOUNTER — Encounter (HOSPITAL_COMMUNITY): Payer: Self-pay | Admitting: Psychiatry

## 2013-09-03 ENCOUNTER — Ambulatory Visit (INDEPENDENT_AMBULATORY_CARE_PROVIDER_SITE_OTHER): Payer: 59 | Admitting: Psychiatry

## 2013-09-03 VITALS — BP 121/66 | HR 78 | Wt 235.0 lb

## 2013-09-03 DIAGNOSIS — F339 Major depressive disorder, recurrent, unspecified: Secondary | ICD-10-CM

## 2013-09-03 DIAGNOSIS — F329 Major depressive disorder, single episode, unspecified: Secondary | ICD-10-CM

## 2013-09-03 MED ORDER — CITALOPRAM HYDROBROMIDE 40 MG PO TABS: 40.0000 mg | ORAL_TABLET | Freq: Every day | ORAL | Status: DC

## 2013-09-03 MED ORDER — NORTRIPTYLINE HCL 25 MG PO CAPS: 25.0000 mg | ORAL_CAPSULE | Freq: Every day | ORAL | Status: DC

## 2013-09-03 NOTE — Progress Notes (Signed)
[facility] [address] Progress Note  [name] [id number] 61 y.o.  09/03/2013 10:21 AM  Chief Complaint:  Medication management and followup.  History of Present Illness: [name] came for his followup appointment.  He is compliant with his Pamelor and Celexa.  He rarely takes Vistaril .  He is sleeping better.  Recently he was sad because one of his friends diagnosed with cancer .  Patient was disappointed because she was unable to diagnosed earlier.  Overall patient is stable on his medication.  He spending more time with his grandson and yard work .  He denies any irritability anger or any crying spells.  He is sleeping better.  He denies any panic attack or any suicidal thoughts.  He is using his CPAP machine.  He is scheduled to see his primary care physician next month for his annual checkup and hard work.  He is not drinking or using any illicit substance.    Suicidal Ideation: No Plan Formed: No Patient has means to carry out plan: No  Homicidal Ideation: No Plan Formed: No Patient has means to carry out plan: No  Review of Systems  Respiratory:       Seasonal allergies.  Musculoskeletal: Positive for back pain.   Psychiatric: Agitation: No Hallucination: No Depressed Mood: No Insomnia: No Hypersomnia: No Altered Concentration: No Feels Worthless: No Grandiose Ideas: No Belief In Special Powers: No New/Increased Substance Abuse: No Compulsions: No  Neurologic: Headache: Yes Seizure: No Paresthesias: No  Past psychiatric history. Patient has a long history of depression which he believe due to difficult marriage.  His been married for 40 years but he admitted Vistaril in his marriage for past 20 years.  He has taken in the past Prozac, Paxil, Zoloft, Abilify and Cymbalta from his primary care physician.  Patient has at least one suicidal attempt 2011 when he took overdose on his hypertensive medication and admitted at [facility].  His last  psychiatric admission was December 2013 at [facility] when he was feeling very depressed , having suicidal thoughts and unable to function.  He was taking Cymbalta 60 mg given by primary care physician.    Medical history. Patient has history of hypertension, obesity, sleep apnea, migraine headache.  His primary care physician is in [facility].  Family and Social History:  Patient denies any family history of psychiatric illness.  He was born in [place] and raised in [place].  His mother lives in [place].  Patient has no contact with his father since 81.  He has 2 children and 3 grandchildren.  Patient denies any history of verbal sexual or emotional abuse.  Patient lives with his wife and mother-in-law.    Outpatient Encounter Prescriptions as of 09/03/2013  Medication Sig  . citalopram (CELEXA) 40 MG tablet Take 1 tablet (40 mg total) by mouth daily.  . hydrOXYzine (ATARAX/VISTARIL) 50 MG tablet Take 1 tablet (50 mg total) by mouth at bedtime.  Marland Kitchen losartan-hydrochlorothiazide (HYZAAR) 100-25 MG per tablet Take 1 tablet by mouth daily.  . naproxen (NAPROSYN) 500 MG tablet Take 1 tablet (500 mg total) by mouth 3 (three) times daily as needed.  . nortriptyline (PAMELOR) 25 MG capsule Take 1 capsule (25 mg total) by mouth at bedtime.  . topiramate (TOPAMAX) 100 MG tablet Take 1 tablet (100 mg total) by mouth at bedtime.  . verapamil (CALAN) 120 MG tablet Take 1 tablet (120 mg total) by mouth daily.  . [  DISCONTINUED] citalopram (CELEXA) 40 MG tablet Take 1 tablet (40 mg total) by mouth daily.  . [DISCONTINUED] nortriptyline (PAMELOR) 25 MG capsule Take 1 capsule (25 mg total) by mouth at bedtime.    Past Psychiatric History/Hospitalization(s): Anxiety: Yes Bipolar Disorder: No Depression: Yes Mania: No Psychosis: No Schizophrenia: No Personality Disorder: No Hospitalization for psychiatric illness: Yes History of Electroconvulsive Shock Therapy:  No Prior Suicide Attempts: Yes  Physical Exam: Constitutional:  BP 121/66  Pulse 78  Wt 235 lb (106.595 kg)  BMI 39.11 kg/m2  General Appearance: well nourished and obese  Musculoskeletal: Strength & Muscle Tone: within normal limits Gait & Station: normal Patient leans: N/A  Psychiatric: Speech (describe rate, volume, coherence, spontaneity, and abnormalities if any): Clear and coherent with normal tone and volume.  Thought Process (describe rate, content, abstract reasoning, and computation): Logical organized and goal-directed.  Associations: Coherent and Relevant  Thoughts: normal  Mental Status: Orientation: oriented to person, place, time/date and situation Mood & Affect: depressed affect and anxiety Attention Span & Concentration: Fair  Medical Decision Making (Choose Three): Established Problem, Stable/Improving (1), Review of Psycho-Social Stressors (1), Review of Last Therapy Session (1) and Review of Medication Regimen & Side Effects (2)  Assessment: Axis I: Maj. depressive disorder recurrent  Axis II: Deferred  Axis III: See medical history  Axis IV: Mild to moderate  Axis V: 60-65   Plan:  I will continue Celexa 40 mg daily and nortriptyline 25 mg at bedtime.  Patient does not need hydroxyzine 50 mg at this time.  He has refills remaining .  Recommended to call us back if he has any question or concern for feel worsening of the symptom.  I will see him again in 3 months. A 90 day prescription of medications are given.  Raymond Whitby T., MD 09/03/2013

## 2013-12-06 ENCOUNTER — Encounter (HOSPITAL_COMMUNITY): Payer: Self-pay | Admitting: Psychiatry

## 2013-12-06 ENCOUNTER — Ambulatory Visit (INDEPENDENT_AMBULATORY_CARE_PROVIDER_SITE_OTHER): Payer: 59 | Admitting: Psychiatry

## 2013-12-06 VITALS — BP 107/67 | HR 95 | Ht 65.0 in | Wt 237.4 lb

## 2013-12-06 DIAGNOSIS — F329 Major depressive disorder, single episode, unspecified: Secondary | ICD-10-CM

## 2013-12-06 MED ORDER — CITALOPRAM HYDROBROMIDE 40 MG PO TABS: 40.0000 mg | ORAL_TABLET | Freq: Every day | ORAL | Status: DC

## 2013-12-06 MED ORDER — NORTRIPTYLINE HCL 25 MG PO CAPS: 25.0000 mg | ORAL_CAPSULE | Freq: Every day | ORAL | Status: DC

## 2013-12-06 NOTE — Progress Notes (Signed)
[facility] [address] Progress Note  Ray[place] [name] [id number] 63 y.o.  12/06/2013 2:08 PM  Chief Complaint:  Medication management and followup.  History of Present Illness: [place] [name] came for his followup appointment.  He is compliant with his Pamelor and Celexa.  He sighs primary care physician in November no new medication added.  His depression is stable.  He was sad and isolated around the holidays but now he is feeling better.  He is keeping himself busy at home.  He is relieved that his friend who was diagnosed with cancer is doing better.  Patient denies any irritability, anger, mood swings or any depressive thoughts.  He is sleeping better .  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  However when he goes to bed he sleeps good.  He is using his CPAP machine.  He is not drinking or using any illegal substances.  Suicidal Ideation: No Plan Formed: No Patient has means to carry out plan: No  Homicidal Ideation: No Plan Formed: No Patient has means to carry out plan: No  ROS Psychiatric: Agitation: No Hallucination: No Depressed Mood: No Insomnia: No Hypersomnia: No Altered Concentration: No Feels Worthless: No Grandiose Ideas: No Belief In Special Powers: No New/Increased Substance Abuse: No Compulsions: No  Neurologic: Headache: Yes Seizure: No Paresthesias: No  Medical history. Patient has history of hypertension, obesity, sleep apnea, migraine headache.  His primary care physician is in [facility].  Family and Social History:  Patient denies any family history of psychiatric illness.  He was born in [place] and raised in [name].  His mother lives in [place].  Patient has no contact with his father since 35.  He has 2 children and 3 grandchildren.  Patient denies any history of verbal sexual or emotional abuse.  Patient lives with his wife and mother-in-law.    Outpatient Encounter Prescriptions as of  12/06/2013  Medication Sig  . citalopram (CELEXA) 40 MG tablet Take 1 tablet (40 mg total) by mouth daily.  . hydrOXYzine (ATARAX/VISTARIL) 50 MG tablet Take 1 tablet (50 mg total) by mouth at bedtime.  Marland Kitchen losartan-hydrochlorothiazide (HYZAAR) 100-25 MG per tablet Take 1 tablet by mouth daily.  . naproxen (NAPROSYN) 500 MG tablet Take 1 tablet (500 mg total) by mouth 3 (three) times daily as needed.  . nortriptyline (PAMELOR) 25 MG capsule Take 1 capsule (25 mg total) by mouth at bedtime.  . topiramate (TOPAMAX) 100 MG tablet Take 1 tablet (100 mg total) by mouth at bedtime.  . verapamil (CALAN) 120 MG tablet Take 1 tablet (120 mg total) by mouth daily.  . [DISCONTINUED] citalopram (CELEXA) 40 MG tablet Take 1 tablet (40 mg total) by mouth daily.  . [DISCONTINUED] nortriptyline (PAMELOR) 25 MG capsule Take 1 capsule (25 mg total) by mouth at bedtime.    Past Psychiatric History/Hospitalization(s): Patient has a long history of depression which he believe due to difficult marriage.  His been married for 40 years but he admitted Vistaril in his marriage for past 20 years.  He has taken in the past Prozac, Paxil, Zoloft, Abilify and Cymbalta from his primary care physician.  Patient has at least one suicidal attempt 2011 when he took overdose on his hypertensive medication and admitted at [facility].  His last psychiatric admission was December 2013 at [facility] when he was feeling very depressed , having suicidal thoughts and unable to function.  He was taking Cymbalta 60 mg given by primary care physician.  Anxiety: Yes Bipolar Disorder: No Depression: Yes Mania: No Psychosis: No Schizophrenia: No Personality Disorder: No Hospitalization for psychiatric illness: Yes History of Electroconvulsive Shock Therapy: No Prior Suicide Attempts: Yes  Physical Exam: Constitutional:  BP 107/67  Pulse 95  Ht 5\' 5"  (1.651 m)  Wt 237 lb 6.4 oz (107.684 kg)   BMI 39.51 kg/m2  General Appearance: well nourished and obese  Musculoskeletal: Strength & Muscle Tone: within normal limits Gait & Station: normal Patient leans: N/A  Psychiatric: Speech (describe rate, volume, coherence, spontaneity, and abnormalities if any): Clear and coherent with normal tone and volume.  Thought Process (describe rate, content, abstract reasoning, and computation): Logical organized and goal-directed.  Associations: Coherent and Relevant.  His fund of knowledge is adequate.  Thoughts: normal  Mental Status: Orientation: oriented to person, place, time/date and situation Mood & Affect: normal affect Attention Span & Concentration: Fair  Established Problem, Stable/Improving (1), Review of Psycho-Social Stressors (1), Review of Last Therapy Session (1) and Review of Medication Regimen & Side Effects (2)  Assessment: Axis I: Maj. depressive disorder recurrent  Axis II: Deferred  Axis III: See medical history  Axis IV: Mild to moderate  Axis V: 60-65   Plan:  I will continue Celexa 40 mg daily and nortriptyline 25 mg at bedtime.  Patient does not have any side effects.  Recommended to call us back if he has any question or concern for feel worsening of the symptom.  I will see him again in 3 months. A 90 day prescription of medications are given.  Cherrell Maybee T., MD 12/06/2013

## 2014-03-07 ENCOUNTER — Encounter (INDEPENDENT_AMBULATORY_CARE_PROVIDER_SITE_OTHER): Payer: Self-pay

## 2014-03-07 ENCOUNTER — Encounter (HOSPITAL_COMMUNITY): Payer: Self-pay | Admitting: Psychiatry

## 2014-03-07 ENCOUNTER — Ambulatory Visit (INDEPENDENT_AMBULATORY_CARE_PROVIDER_SITE_OTHER): Payer: 59 | Admitting: Psychiatry

## 2014-03-07 ENCOUNTER — Ambulatory Visit (HOSPITAL_COMMUNITY): Payer: Self-pay | Admitting: Psychiatry

## 2014-03-07 VITALS — BP 126/70 | HR 84 | Ht 65.0 in | Wt 239.0 lb

## 2014-03-07 DIAGNOSIS — F329 Major depressive disorder, single episode, unspecified: Secondary | ICD-10-CM

## 2014-03-07 DIAGNOSIS — F339 Major depressive disorder, recurrent, unspecified: Secondary | ICD-10-CM

## 2014-03-07 MED ORDER — CITALOPRAM HYDROBROMIDE 40 MG PO TABS: 40.0000 mg | ORAL_TABLET | Freq: Every day | ORAL | Status: DC

## 2014-03-07 MED ORDER — NORTRIPTYLINE HCL 25 MG PO CAPS: 25.0000 mg | ORAL_CAPSULE | Freq: Every day | ORAL | Status: DC

## 2014-03-07 NOTE — Progress Notes (Signed)
[facility] [id number] Progress Note  Darrel ReachRoger D Geraldo [id number] 63 y.o.  03/07/2014 10:59 AM  Chief Complaint:  Medication management and followup.  History of Present Illness: Raymond [name] came for his followup appointment.  He is compliant with his Pamelor and Celexa.  He takes sometime Vistaril for anxiety .  He is glad that his girlfriend is recovering from cancer treatment.  He's excited because his daughter is pregnant .  He also bought in [id number] [employer] and he loves it.  Patient continues to have insomnia and sometime he does not take his CPAP machine.  However he is feeling better and he described his mood good.  He denies any irritability, anger, mood swing.  He is keeping himself busy at home.  He denies any paranoia or any hallucinations.  He denies any tremors or any shakes.  He is not drinking or using any of the substances.  He reported his appetite is okay.  Suicidal Ideation: No Plan Formed: No Patient has means to carry out plan: No  Homicidal Ideation: No Plan Formed: No Patient has means to carry out plan: No  ROS Psychiatric: Agitation: No Hallucination: No Depressed Mood: No Insomnia: No Hypersomnia: No Altered Concentration: No Feels Worthless: No Grandiose Ideas: No Belief In Special Powers: No New/Increased Substance Abuse: No Compulsions: No  Neurologic: Headache: Yes Seizure: No Paresthesias: No  Medical history. Patient has history of hypertension, obesity, sleep apnea, migraine headache.  [facility].  Family and Social History:  Patient denies any family history of psychiatric illness.  He was born in [place] and raised in [place].  His mother lives in [place].  Patient has no contact with his father since [id number].  He has 2 children and 3 grandchildren.  Patient denies any history of verbal sexual or emotional abuse.  Patient lives with his wife and mother-in-law.    Outpatient Encounter Prescriptions as  of 03/07/2014  Medication Sig  . citalopram (CELEXA) 40 MG tablet Take 1 tablet (40 mg total) by mouth daily.  . hydrOXYzine (ATARAX/VISTARIL) 50 MG tablet Take 1 tablet (50 mg total) by mouth at bedtime.  Marland Kitchen. losartan-hydrochlorothiazide (HYZAAR) 100-25 MG per tablet Take 1 tablet by mouth daily.  . naproxen (NAPROSYN) 500 MG tablet Take 1 tablet (500 mg total) by mouth 3 (three) times daily as needed.  . nortriptyline (PAMELOR) 25 MG capsule Take 1 capsule (25 mg total) by mouth at bedtime.  . topiramate (TOPAMAX) 100 MG tablet Take 1 tablet (100 mg total) by mouth at bedtime.  . verapamil (CALAN) 120 MG tablet Take 1 tablet (120 mg total) by mouth daily.  . [DISCONTINUED] citalopram (CELEXA) 40 MG tablet Take 1 tablet (40 mg total) by mouth daily.  . [DISCONTINUED] nortriptyline (PAMELOR) 25 MG capsule Take 1 capsule (25 mg total) by mouth at bedtime.    Past Psychiatric History/Hospitalization(s): Patient has a long history of depression which he believe due to difficult marriage.  He has taken in the past Prozac, Paxil, Zoloft, Abilify and Cymbalta from his primary care physician.  Patient has at least one suicidal attempt 2011 when he took overdose on his hypertensive medication and [facility].  His last psychiatric [facility] was [facility] when he was feeling very depressed , having suicidal thoughts and unable to function.  He was taking Cymbalta 60 mg given by primary care physician.  Anxiety: Yes Bipolar Disorder: No Depression:  Yes Mania: No Psychosis: No Schizophrenia: No Personality Disorder: No Hospitalization for psychiatric illness: Yes History of Electroconvulsive Shock Therapy: No Prior Suicide Attempts: Yes  Physical Exam: Constitutional:  There were no vitals taken for this visit.  General Appearance: well nourished and obese  Musculoskeletal: Strength & Muscle Tone: within normal limits Gait &  Station: normal Patient leans: N/A  Psychiatric: Speech (describe rate, volume, coherence, spontaneity, and abnormalities if any): Clear and coherent with normal tone and volume.  Thought Process (describe rate, content, abstract reasoning, and computation): Logical organized and goal-directed.  Associations: Coherent and Relevant.  His fund of knowledge is adequate.  Thoughts: normal  Mental Status: Orientation: oriented to person, place, time/date and situation Mood & Affect: normal affect Attention Span & Concentration: Fair  Established Problem, Stable/Improving (1), Review of Psycho-Social Stressors (1), Review of Last Therapy Session (1) and Review of Medication Regimen & Side Effects (2)  Assessment: Axis I: Maj. depressive disorder recurrent  Axis II: Deferred  Axis III: See medical history  Axis IV: Mild to moderate  Axis V: 60-65   Plan:  Patient is doing better on his current medication.  I will continue Celexa 40 mg daily and nortriptyline 25 mg at bedtime and Vistaril only as needed for severe anxiety.  Patient does not have any side effects.  Recommended to call us back if he has any question or concern for feel worsening of the symptom.  I will see him again in 3 months. A 90 day prescription of medications are given.  Collier Bohnet T., MD 03/07/2014

## 2014-06-07 ENCOUNTER — Ambulatory Visit (INDEPENDENT_AMBULATORY_CARE_PROVIDER_SITE_OTHER): Payer: 59 | Admitting: Psychiatry

## 2014-06-07 ENCOUNTER — Encounter (HOSPITAL_COMMUNITY): Payer: Self-pay | Admitting: Psychiatry

## 2014-06-07 VITALS — BP 155/92 | HR 70 | Ht 65.0 in | Wt 243.2 lb

## 2014-06-07 DIAGNOSIS — F330 Major depressive disorder, recurrent, mild: Secondary | ICD-10-CM

## 2014-06-07 MED ORDER — CITALOPRAM HYDROBROMIDE 40 MG PO TABS: 40.0000 mg | ORAL_TABLET | Freq: Every day | ORAL | Status: DC

## 2014-06-07 MED ORDER — NORTRIPTYLINE HCL 25 MG PO CAPS: 25.0000 mg | ORAL_CAPSULE | Freq: Every day | ORAL | Status: DC

## 2014-06-07 MED ORDER — HYDROXYZINE HCL 50 MG PO TABS: 50.0000 mg | ORAL_TABLET | Freq: Every day | ORAL | Status: DC

## 2014-06-07 NOTE — Progress Notes (Signed)
[facility] [id number] Progress Note  Darrel ReachRoger D Caudell [id number] 63 y.o.  06/07/2014 10:54 AM  Chief Complaint:  Medication management and followup.  History of Present Illness: Raymond [name] came for his followup appointment.  He is taking his medication as prescribed.  He had a busy summer.  He is keeping himself in house projects .  Recently he had a pool and now he is working on backyard , outdoor patio and deck.  He denies any agitation, anger, mood swing.  He is sleeping good.  He is taking Celexa, Pamelor and Vistaril as prescribed.  He denies any side effects of medication.  He denies any irritability, anger, mood swings.  He is excited about his daughter who is pregnant and due in December.  He feels good because his grandkids using the pool.  His vitals are stable.  His appetite is good.  His friend is recovering from chemotherapy.  He was to continue his current psychotropic medication.  Patient does not drink or use any illegal substances.  Patient lives with his wife and his mother-in-law.   Suicidal Ideation: No Plan Formed: No Patient has means to carry out plan: No  Homicidal Ideation: No Plan Formed: No Patient has means to carry out plan: No  ROS Psychiatric: Agitation: No Hallucination: No Depressed Mood: No Insomnia: No Hypersomnia: No Altered Concentration: No Feels Worthless: No Grandiose Ideas: No Belief In Special Powers: No New/Increased Substance Abuse: No Compulsions: No  Neurologic: Headache: Yes Seizure: No Paresthesias: No  Medical history. Patient has history of hypertension, obesity, sleep apnea, migraine headache.  His primary care physician is [facility] Rulley [facility].   Outpatient Encounter Prescriptions as of 06/07/2014  Medication Sig  . citalopram (CELEXA) 40 MG tablet Take 1 tablet (40 mg total) by mouth daily.  . hydrOXYzine (ATARAX/VISTARIL) 50 MG tablet Take 1 tablet (50 mg total) by mouth at bedtime.  Marland Kitchen. losartan-hydrochlorothiazide  (HYZAAR) 100-25 MG per tablet Take 1 tablet by mouth daily.  . naproxen (NAPROSYN) 500 MG tablet Take 1 tablet (500 mg total) by mouth 3 (three) times daily as needed.  . nortriptyline (PAMELOR) 25 MG capsule Take 1 capsule (25 mg total) by mouth at bedtime.  . topiramate (TOPAMAX) 100 MG tablet Take 1 tablet (100 mg total) by mouth at bedtime.  . verapamil (CALAN) 120 MG tablet Take 1 tablet (120 mg total) by mouth daily.  . [DISCONTINUED] citalopram (CELEXA) 40 MG tablet Take 1 tablet (40 mg total) by mouth daily.  . [DISCONTINUED] hydrOXYzine (ATARAX/VISTARIL) 50 MG tablet Take 1 tablet (50 mg total) by mouth at bedtime.  . [DISCONTINUED] nortriptyline (PAMELOR) 25 MG capsule Take 1 capsule (25 mg total) by mouth at bedtime.    Past Psychiatric History/Hospitalization(s): Patient has a long history of depression.  In the past he has taken Prozac, Paxil, Zoloft, Abilify and Cymbalta from his primary care physician.  Patient has at least one suicidal attempt 2011 when he took overdose on his hypertensive medication and admitted [facility].  His last psychiatric [facility] was December [facility] when he was feeling very depressed , having suicidal thoughts and unable to function.  He was taking Cymbalta 60 mg given by primary care physician.  Anxiety: Yes Bipolar Disorder: No Depression: Yes Mania: No Psychosis: No Schizophrenia: No Personality Disorder: No Hospitalization for psychiatric illness: Yes History of Electroconvulsive Shock Therapy: No Prior Suicide Attempts: Yes  Physical Exam: Constitutional:  BP 155/92  Pulse 70  Ht 5\' 5"  (1.651 m)  Wt 243 lb 3.2 oz (110.315 kg)  BMI 40.47 kg/m2  General Appearance: well nourished and obese  Musculoskeletal: Strength & Muscle Tone: within normal limits Gait & Station: normal Patient leans: N/A  Psychiatric: Speech (describe rate, volume, coherence, spontaneity, and  abnormalities if any): Clear and coherent with normal tone and volume.  Thought Process (describe rate, content, abstract reasoning, and computation): Logical organized and goal-directed.  Associations: Coherent and Relevant.  His fund of knowledge is adequate.  Thoughts: normal  Mental Status: Orientation: oriented to person, place, time/date and situation Mood & Affect: normal affect Attention Span & Concentration: Fair  Established Problem, Stable/Improving (1), Review of Psycho-Social Stressors (1), Review of Last Therapy Session (1) and Review of Medication Regimen & Side Effects (2)  Assessment: Axis I: Maj. depressive disorder recurrent  Axis II: Deferred  Axis III: See medical history  Axis IV: Mild to moderate  Axis V: 60-65   Plan:   patient is a stable on his current psychotropic medication.  He is keeping himself busy.  I will continue Celexa 40 mg daily and nortriptyline 25 mg at bedtime and Vistaril  50 mg as needed for severe anxiety.  Patient does not have any side effects.  Recommended to call us back if he has any question or concern for feel worsening of the symptom.  I will see him again in 3 months. A 90 day prescription of medications are given.  Dulse Rutan T., MD 06/07/2014

## 2014-08-12 ENCOUNTER — Other Ambulatory Visit (HOSPITAL_COMMUNITY): Payer: Self-pay | Admitting: Psychiatry

## 2014-08-12 NOTE — Telephone Encounter (Signed)
Refill not appropriate for Raymond [name]. Filled 8/4 for 90 day supply, next appt 11/4 will be filled then.

## 2014-09-07 ENCOUNTER — Ambulatory Visit (INDEPENDENT_AMBULATORY_CARE_PROVIDER_SITE_OTHER): Payer: 59 | Admitting: Psychiatry

## 2014-09-07 ENCOUNTER — Encounter (HOSPITAL_COMMUNITY): Payer: Self-pay | Admitting: Psychiatry

## 2014-09-07 VITALS — BP 142/79 | HR 75 | Ht 65.0 in | Wt 240.6 lb

## 2014-09-07 DIAGNOSIS — F330 Major depressive disorder, recurrent, mild: Secondary | ICD-10-CM

## 2014-09-07 DIAGNOSIS — F332 Major depressive disorder, recurrent severe without psychotic features: Secondary | ICD-10-CM

## 2014-09-07 MED ORDER — NORTRIPTYLINE HCL 25 MG PO CAPS: 25.0000 mg | ORAL_CAPSULE | Freq: Every day | ORAL | Status: DC

## 2014-09-07 MED ORDER — CITALOPRAM HYDROBROMIDE 40 MG PO TABS: 40.0000 mg | ORAL_TABLET | Freq: Every day | ORAL | Status: DC

## 2014-09-07 NOTE — Progress Notes (Signed)
[facility] [id number] Progress Note  Darrel ReachRoger D Deemer [id number] 63 y.o.  09/07/2014 1:44 PM  Chief Complaint:  Medication management and followup.  History of Present Illness: Raymond [name] came for his followup appointment.  He is compliant with Celexa and Pamelor however not taking Vistaril every night.  Overall his sleep is good.  He admitted there are days when he is very tired with lack of motivation to do anything things.  He is unable to enjoy a pool recently because of cold weather.  His daughter is due in December and he is excited about her.  Patient is also going to be 63 in December.  He will apply for Medicare and he is anxious about it.  Patient denies any irritability, anger, mood swing.  He denies any crying spells.  His energy level is okay.  He tried to keep himself busy with his grandkids.  He has 5 grandsons but he is excited because his daughter is having a baby girl in December.  Patient denies any drinking or using any illegal substances.  His appetite is okay.  His vitals are stable.  Patient lives with his wife and his mother-in-law.  Suicidal Ideation: No Plan Formed: No Patient has means to carry out plan: No  Homicidal Ideation: No Plan Formed: No Patient has means to carry out plan: No  ROS Psychiatric: Agitation: No Hallucination: No Depressed Mood: No Insomnia: No Hypersomnia: No Altered Concentration: No Feels Worthless: No Grandiose Ideas: No Belief In Special Powers: No New/Increased Substance Abuse: No Compulsions: No  Neurologic: Headache: Yes Seizure: No Paresthesias: No  Medical history. Patient has history of hypertension, obesity, sleep apnea, migraine headache.  His primary care physician is [facility] Rulley [facility].   Outpatient Encounter Prescriptions as of 09/07/2014  Medication Sig  . citalopram (CELEXA) 40 MG tablet Take 1 tablet (40 mg total) by mouth daily.  . hydrOXYzine (ATARAX/VISTARIL) 50 MG tablet Take 1 tablet (50 mg  total) by mouth at bedtime.  Marland Kitchen. losartan-hydrochlorothiazide (HYZAAR) 100-25 MG per tablet Take 1 tablet by mouth daily.  . naproxen (NAPROSYN) 500 MG tablet Take 1 tablet (500 mg total) by mouth 3 (three) times daily as needed.  . nortriptyline (PAMELOR) 25 MG capsule Take 1 capsule (25 mg total) by mouth at bedtime.  . topiramate (TOPAMAX) 100 MG tablet Take 1 tablet (100 mg total) by mouth at bedtime.  . verapamil (CALAN) 120 MG tablet Take 1 tablet (120 mg total) by mouth daily.  . [DISCONTINUED] citalopram (CELEXA) 40 MG tablet Take 1 tablet (40 mg total) by mouth daily.  . [DISCONTINUED] nortriptyline (PAMELOR) 25 MG capsule Take 1 capsule (25 mg total) by mouth at bedtime.    Past Psychiatric History/Hospitalization(s): Patient has a long history of depression.  In the past he has taken Prozac, Paxil, Zoloft, Abilify and Cymbalta from his primary care physician.  Patient has at least one suicidal attempt 2011 when he took overdose on his hypertensive medication and [facility].  His last psychiatric [facility] was [facility] when he was feeling very depressed , having suicidal thoughts and unable to function.  He was taking Cymbalta 60 mg given by primary care physician.  Anxiety: Yes Bipolar Disorder: No Depression: Yes Mania: No Psychosis: No Schizophrenia: No Personality Disorder: No Hospitalization for psychiatric illness: Yes History of Electroconvulsive Shock Therapy: No Prior Suicide Attempts: Yes  Physical Exam: Constitutional:  BP 142/79 mmHg  Pulse 75  Ht 5\' 5"  (1.651 m)  Wt 240 lb 9.6 oz (109.135 kg)  BMI 40.04 kg/m2  General Appearance: well nourished and obese  Musculoskeletal: Strength & Muscle Tone: within normal limits Gait & Station: normal Patient leans: N/A  Psychiatric: Speech (describe rate, volume, coherence, spontaneity, and abnormalities if any): Clear and coherent with normal  tone and volume.  Thought Process (describe rate, content, abstract reasoning, and computation): Logical organized and goal-directed.  Associations: Coherent and Relevant.  His fund of knowledge is adequate.  Thoughts: normal  Mental Status: Orientation: oriented to person, place, time/date and situation Mood & Affect: normal affect Attention Span & Concentration: Fair  Established Problem, Stable/Improving (1), Review of Psycho-Social Stressors (1), Review of Last Therapy Session (1) and Review of Medication Regimen & Side Effects (2)  Assessment: Axis I: Maj. depressive disorder recurrent  Axis II: Deferred  Axis III: See medical history  Axis IV: Mild to moderate  Axis V: 60-65   Plan:  Patient is stable on his current medication.  He is not taking Vistaril every night and he has a still refills remaining on his Vistaril.  A new prescription of Celexa and nortriptyline is given.  Discussed medication side effects.  Recommended to call us back if he has any question or any concern.  Follow-up in 3 months.   Olanrewaju Osborn T., MD 09/07/2014

## 2014-11-10 ENCOUNTER — Other Ambulatory Visit (HOSPITAL_COMMUNITY): Payer: Self-pay | Admitting: Psychiatry

## 2014-11-10 ENCOUNTER — Telehealth (HOSPITAL_COMMUNITY): Payer: Self-pay

## 2014-11-10 NOTE — Telephone Encounter (Signed)
Too soon to refill his prescription.  He was given prescription on November 4 for 90 days.

## 2014-11-11 ENCOUNTER — Telehealth (HOSPITAL_COMMUNITY): Payer: Self-pay | Admitting: *Deleted

## 2014-11-11 NOTE — Telephone Encounter (Signed)
Pt called asking why his prescription was not sent to his new pharmacy. Explained note Dr. Lolly MustacheArfeen had written that it was too soon for a refill. Pt was going to call [facility] to have remaining refills transferred to his new pharmacy. He will call back if he has any further questions or concerns.

## 2014-12-08 ENCOUNTER — Encounter (HOSPITAL_COMMUNITY): Payer: Self-pay | Admitting: Psychiatry

## 2014-12-08 ENCOUNTER — Ambulatory Visit (INDEPENDENT_AMBULATORY_CARE_PROVIDER_SITE_OTHER): Payer: Medicare Other | Admitting: Psychiatry

## 2014-12-08 VITALS — BP 131/91 | HR 77 | Ht 65.0 in | Wt 239.6 lb

## 2014-12-08 DIAGNOSIS — F330 Major depressive disorder, recurrent, mild: Secondary | ICD-10-CM

## 2014-12-08 DIAGNOSIS — F339 Major depressive disorder, recurrent, unspecified: Secondary | ICD-10-CM

## 2014-12-08 MED ORDER — NORTRIPTYLINE HCL 25 MG PO CAPS: 25.0000 mg | ORAL_CAPSULE | Freq: Every day | ORAL | Status: DC

## 2014-12-08 MED ORDER — CITALOPRAM HYDROBROMIDE 40 MG PO TABS: 40.0000 mg | ORAL_TABLET | Freq: Every day | ORAL | Status: DC

## 2014-12-08 NOTE — Progress Notes (Signed)
[facility] [id number] Progress Note  Raymond [name] [id number] 65 y.o.  12/08/2014 9:07 AM  Chief Complaint:  Medication management and followup.  History of Present Illness: Raymond [name] came for his followup appointment.  He is taking Celexa and nortriptyline on a regular basis.  He is doing much better on these medication.  Sometime he takes Vistaril and he is still has refill remaining.  He had to stop taking Topamax because he feel nortriptyline helping his migraine headaches.  He sleeping good.  He has a granddaughter around Thanksgiving however he reported she may have a Down syndrome.  Patient told no one in the family has Down syndrome and in the beginning family was concerned but now everyone is adjusting well to the news.  Patient current 25 and now he has Medicare .  He change his pharmacy .  Patient denies any side effects of medication.  He continues to spend a lot of time doing house projects.  Recently he built a train track for his grandson.  Patient denies any crying spells, irritability, agitation or any mood swing.  He denies any feeling of hopelessness.  He has no tremors or shakes.  He wants to continue his current psychotropic medication.  Patient denies drinking or using any illegal substances.  His appetite is okay and his vitals are stable.  Patient lives with his wife and his mother-in-law.  Suicidal Ideation: No Plan Formed: No Patient has means to carry out plan: No  Homicidal Ideation: No Plan Formed: No Patient has means to carry out plan: No  ROS Psychiatric: Agitation: No Hallucination: No Depressed Mood: No Insomnia: No Hypersomnia: No Altered Concentration: No Feels Worthless: No Grandiose Ideas: No Belief In Special Powers: No New/Increased Substance Abuse: No Compulsions: No  Neurologic: Headache: Yes Seizure: No Paresthesias: No  Medical history. Patient has history of hypertension, obesity, sleep apnea, migraine headache.  His primary care  physician is Ned Card in [facility].   Outpatient Encounter Prescriptions as of 12/08/2014  Medication Sig  . citalopram (CELEXA) 40 MG tablet Take 1 tablet (40 mg total) by mouth daily.  . hydrOXYzine (ATARAX/VISTARIL) 50 MG tablet Take 1 tablet (50 mg total) by mouth at bedtime.  Marland Kitchen losartan-hydrochlorothiazide (HYZAAR) 100-25 MG per tablet Take 1 tablet by mouth daily.  . naproxen (NAPROSYN) 500 MG tablet Take 1 tablet (500 mg total) by mouth 3 (three) times daily as needed.  . nortriptyline (PAMELOR) 25 MG capsule Take 1 capsule (25 mg total) by mouth at bedtime.  . verapamil (CALAN-SR) 120 MG CR tablet   . [DISCONTINUED] citalopram (CELEXA) 40 MG tablet Take 1 tablet (40 mg total) by mouth daily.  . [DISCONTINUED] nortriptyline (PAMELOR) 25 MG capsule Take 1 capsule (25 mg total) by mouth at bedtime.  . [DISCONTINUED] topiramate (TOPAMAX) 100 MG tablet Take 1 tablet (100 mg total) by mouth at bedtime. (Patient not taking: Reported on 12/08/2014)  . [DISCONTINUED] verapamil (CALAN) 120 MG tablet Take 1 tablet (120 mg total) by mouth daily.    Past Psychiatric History/Hospitalization(s): Patient has a long history of depression.  In the past he has taken Prozac, Paxil, Zoloft, Abilify and Cymbalta from his primary care physician.  Patient has at least one suicidal attempt 2011 when he took overdose on his hypertensive medication and admitted at [facility].  His last psychiatric admission was December 2013 at [facility] when he was feeling very depressed , having suicidal thoughts and unable to function.  He was taking Cymbalta 60 mg given by primary care physician.  Anxiety: Yes Bipolar Disorder: No Depression: Yes Mania: No Psychosis: No Schizophrenia: No Personality Disorder: No Hospitalization for psychiatric illness: Yes History of Electroconvulsive Shock Therapy: No Prior Suicide Attempts: Yes  Physical Exam: Constitutional:  BP  131/91 mmHg  Pulse 77  Ht 5\' 5"  (1.651 m)  Wt 239 lb 9.6 oz (108.682 kg)  BMI 39.87 kg/m2  General Appearance: well nourished and obese  Musculoskeletal: Strength & Muscle Tone: within normal limits Gait & Station: normal Patient leans: N/A  Psychiatric: Speech (describe rate, volume, coherence, spontaneity, and abnormalities if any): High-pitched voice but clear and coherent.   Thought Process (describe rate, content, abstract reasoning, and computation): Logical organized and goal-directed.  Associations: Coherent and Relevant.  His fund of knowledge is adequate.  Thoughts: normal  Mental Status: Orientation: oriented to person, place, time/date and situation Mood & Affect: normal affect Attention Span & Concentration: Fair  Established Problem, Stable/Improving (1), Review of Psycho-Social Stressors (1), Review of Last Therapy Session (1) and Review of Medication Regimen & Side Effects (2)  Assessment: Axis I: Maj. depressive disorder recurrent  Axis II: Deferred  Axis III: See medical history   Plan:  Patient is stable on his current medication.  He is compliant with Celexa 40 mg and nortriptyline 25 mg daily.  He does not have any side effects.  He takes Vistaril only as needed and he still has refill remaining.  Discussed medication side effects and benefits.  He like to send his prescription to [facility].  Recommended to call us back if he has any question or any concern.  Follow-up in 3 months.   Akbar Sacra T., MD 12/08/2014

## 2015-03-08 ENCOUNTER — Ambulatory Visit (INDEPENDENT_AMBULATORY_CARE_PROVIDER_SITE_OTHER): Payer: Medicare Other | Admitting: Psychiatry

## 2015-03-08 ENCOUNTER — Encounter (HOSPITAL_COMMUNITY): Payer: Self-pay | Admitting: Psychiatry

## 2015-03-08 VITALS — BP 134/72 | HR 68 | Ht 65.0 in | Wt 241.0 lb

## 2015-03-08 DIAGNOSIS — F330 Major depressive disorder, recurrent, mild: Secondary | ICD-10-CM

## 2015-03-08 DIAGNOSIS — F419 Anxiety disorder, unspecified: Secondary | ICD-10-CM | POA: Diagnosis not present

## 2015-03-08 MED ORDER — CITALOPRAM HYDROBROMIDE 40 MG PO TABS: 40.0000 mg | ORAL_TABLET | Freq: Every day | ORAL | Status: DC

## 2015-03-08 MED ORDER — NORTRIPTYLINE HCL 25 MG PO CAPS: 25.0000 mg | ORAL_CAPSULE | Freq: Every day | ORAL | Status: DC

## 2015-03-08 NOTE — Progress Notes (Signed)
[facility] [id number] Progress Note  Darrel ReachRoger D Vrooman [id number] 64 y.o.  03/08/2015 8:53 AM  Chief Complaint:  Medication management and followup.  History of Present Illness: Raymond [name] came for his followup appointment.  He is compliant with Celexa and nortriptyline.  He is feeling better with the medication.  He has good energy level.  His grandbaby is now 655 months old.  Despite the concern that grandchild may have Down syndrome he mentioned that she is achieving the milestones.  He is taking nortriptyline which is helping his headaches and anxiety.  He denies any major panic attack or anxiety attack.  Denies any crying spells or any feeling of hopelessness or worthlessness.  He continues to engage himself helping his neighbors . He continues to spend a lot of time doing house projects.  He has not finished train track for his grandson.  He reported his energy level is good.  He denies any crying spells, irritability, agitation or any mood swing.  He denies any feeling of hopelessness.  He has no tremors or shakes.  His appetite is okay and his vitals are stable.  Patient lives with his wife and his mother-in-law.  Suicidal Ideation: No Plan Formed: No Patient has means to carry out plan: No  Homicidal Ideation: No Plan Formed: No Patient has means to carry out plan: No  Review of Systems  Constitutional: Negative.   Skin: Negative.   Neurological: Negative.  Negative for headaches.   Psychiatric: Agitation: No Hallucination: No Depressed Mood: No Insomnia: No Hypersomnia: No Altered Concentration: No Feels Worthless: No Grandiose Ideas: No Belief In Special Powers: No New/Increased Substance Abuse: No Compulsions: No  Neurologic: Headache: Yes Seizure: No Paresthesias: No  Medical history. Patient has history of hypertension, obesity, sleep apnea, migraine headache.  His primary care physician is Ned Card[facility] Rulley in [facility].   Outpatient Encounter Prescriptions as of  03/08/2015  Medication Sig  . citalopram (CELEXA) 40 MG tablet Take 1 tablet (40 mg total) by mouth daily.  . hydrOXYzine (ATARAX/VISTARIL) 50 MG tablet Take 1 tablet (50 mg total) by mouth at bedtime.  Marland Kitchen. losartan-hydrochlorothiazide (HYZAAR) 100-25 MG per tablet Take 1 tablet by mouth daily.  . naproxen (NAPROSYN) 500 MG tablet Take 1 tablet (500 mg total) by mouth 3 (three) times daily as needed.  . nortriptyline (PAMELOR) 25 MG capsule Take 1 capsule (25 mg total) by mouth at bedtime.  . verapamil (CALAN-SR) 120 MG CR tablet   . [DISCONTINUED] citalopram (CELEXA) 40 MG tablet Take 1 tablet (40 mg total) by mouth daily.  . [DISCONTINUED] nortriptyline (PAMELOR) 25 MG capsule Take 1 capsule (25 mg total) by mouth at bedtime.   No facility-administered encounter medications on file as of 03/08/2015.    Past Psychiatric History/Hospitalization(s): Patient has a long history of depression.  In the past he has taken Prozac, Paxil, Zoloft, Abilify and Cymbalta from his primary care physician.  Patient has at least one suicidal attempt 2011 when he took overdose on his hypertensive medication and admitted at [facility].  His last psychiatric [facility] was December 2013 [facility] when he was feeling very depressed , having suicidal thoughts and unable to function.  He was taking Cymbalta 60 mg given by primary care physician.  Anxiety: Yes Bipolar Disorder: No Depression: Yes Mania: No Psychosis: No Schizophrenia: No Personality Disorder: No Hospitalization for psychiatric illness: Yes History of Electroconvulsive Shock Therapy: No Prior Suicide Attempts: Yes  Physical Exam: Constitutional:  BP 134/72 mmHg  Pulse 68  Ht 5\' 5"  (1.651 m)  Wt 241 lb (109.317 kg)  BMI 40.10 kg/m2  General Appearance: well nourished and obese  Musculoskeletal: Strength & Muscle Tone: within normal limits Gait & Station: normal Patient leans:  N/A  Psychiatric: Speech (describe rate, volume, coherence, spontaneity, and abnormalities if any): High-pitched voice but clear and coherent.   Thought Process (describe rate, content, abstract reasoning, and computation): Logical organized and goal-directed.  Associations: Coherent and Relevant.  His fund of knowledge is adequate.  Thoughts: normal  Mental Status: Orientation: oriented to person, place, time/date and situation Mood & Affect: normal affect Attention Span & Concentration: Fair  Established Problem, Stable/Improving (1), Review of Psycho-Social Stressors (1), Review of Last Therapy Session (1) and Review of Medication Regimen & Side Effects (2)  Assessment: Axis I: Maj. depressive disorder recurrent, anxiety disorder NOS  Axis II: Deferred  Axis III: See medical history   Plan:  Depression ; patient is a stable and Celexa and nortriptyline.  Continue current medication.  Anxiety: Patient likes taking nortriptyline which is helping his anxiety and headaches.  He has no tremors or shakes.  He has still remaining Vistaril which she takes only as needed for insomnia.  Recommended to call us back if he has any question or any concern.  Follow-up in 3 months.  He like to send his prescription to the [facility]. Follow-up in 3 months.   Estefanny Moler T., MD 03/08/2015

## 2015-06-08 ENCOUNTER — Ambulatory Visit (HOSPITAL_COMMUNITY): Payer: Self-pay | Admitting: Psychiatry

## 2015-06-22 ENCOUNTER — Telehealth (HOSPITAL_COMMUNITY): Payer: Self-pay

## 2015-06-22 DIAGNOSIS — F330 Major depressive disorder, recurrent, mild: Secondary | ICD-10-CM

## 2015-06-22 NOTE — Telephone Encounter (Signed)
Yes ok to refill 

## 2015-06-22 NOTE — Telephone Encounter (Signed)
Medication refill fax received for patient's prescribed Celexa and Nortriptyline from [facility].  Called patient to assist him with rescheduling missed appointment for 06/28/15 but would like a refill of both as will be out in 2 days.

## 2015-06-23 MED ORDER — NORTRIPTYLINE HCL 25 MG PO CAPS: 25.0000 mg | ORAL_CAPSULE | Freq: Every day | ORAL | Status: DC

## 2015-06-23 MED ORDER — CITALOPRAM HYDROBROMIDE 40 MG PO TABS: 40.0000 mg | ORAL_TABLET | Freq: Every day | ORAL | Status: DC

## 2015-06-23 NOTE — Telephone Encounter (Signed)
Telephone call with patient to inform Dr. Lolly Mustache authorized a one time refill of patient's Celexa and Nortriptyline and orders e-scribed into patient's requested [facility] store this date as approved.  Reminded patient of need to keep appointment on 06/28/15 for further refills and patient agreed with plan.

## 2015-06-28 ENCOUNTER — Encounter (HOSPITAL_COMMUNITY): Payer: Self-pay | Admitting: Psychiatry

## 2015-06-28 ENCOUNTER — Ambulatory Visit (INDEPENDENT_AMBULATORY_CARE_PROVIDER_SITE_OTHER): Payer: Medicare Other | Admitting: Psychiatry

## 2015-06-28 VITALS — BP 134/74 | HR 98 | Ht 65.0 in | Wt 240.6 lb

## 2015-06-28 DIAGNOSIS — F330 Major depressive disorder, recurrent, mild: Secondary | ICD-10-CM

## 2015-06-28 DIAGNOSIS — F419 Anxiety disorder, unspecified: Secondary | ICD-10-CM

## 2015-06-28 DIAGNOSIS — F339 Major depressive disorder, recurrent, unspecified: Secondary | ICD-10-CM

## 2015-06-28 MED ORDER — CITALOPRAM HYDROBROMIDE 40 MG PO TABS: 40.0000 mg | ORAL_TABLET | Freq: Every day | ORAL | Status: DC

## 2015-06-28 MED ORDER — NORTRIPTYLINE HCL 25 MG PO CAPS: 25.0000 mg | ORAL_CAPSULE | Freq: Every day | ORAL | Status: DC

## 2015-06-28 NOTE — Progress Notes (Signed)
[facility] [id number] Progress Note  Raymond [name] [id number] 64 y.o.  06/28/2015 2:48 PM  Chief Complaint:  Medication management and followup.  History of Present Illness: Raymond [name] came for his followup appointment.  His summer was very busy.  He is doing a lot of renovation at home.  He feels proud that it was productive.  He sleeping good.  He denies any irritability, anger, mood swing or any feeling of hopelessness or worthlessness.  His energy level is good.  He was sad because his grandbaby was diagnosed with Down syndrome.  Patient taking nortriptyline and Celexa every day.  He takes Vistaril as needed to help his sleep and anxiety.  Patient has no tremors, shakes or any rash.  His appetite is good.  His vitals are stable.  He denies any drinking or using any illegal substances.  Patient lives with his wife and his mother-in-law.  Suicidal Ideation: No Plan Formed: No Patient has means to carry out plan: No  Homicidal Ideation: No Plan Formed: No Patient has means to carry out plan: No  Review of Systems  Constitutional: Negative.   Skin: Negative.   Neurological: Negative.  Negative for headaches.   Psychiatric: Agitation: No Hallucination: No Depressed Mood: No Insomnia: No Hypersomnia: No Altered Concentration: No Feels Worthless: No Grandiose Ideas: No Belief In Special Powers: No New/Increased Substance Abuse: No Compulsions: No  Neurologic: Headache: No Seizure: No Paresthesias: No  Medical history. Patient has history of hypertension, obesity, sleep apnea, migraine headache.  His primary care physician is Raymond [name] in [facility].   Outpatient Encounter Prescriptions as of 06/28/2015  Medication Sig  . citalopram (CELEXA) 40 MG tablet Take 1 tablet (40 mg total) by mouth daily.  . hydrOXYzine (ATARAX/VISTARIL) 50 MG tablet Take 1 tablet (50 mg total) by mouth at bedtime.  Marland Kitchen losartan-hydrochlorothiazide (HYZAAR) 100-25 MG per tablet Take 1 tablet  by mouth daily.  . naproxen (NAPROSYN) 500 MG tablet Take 1 tablet (500 mg total) by mouth 3 (three) times daily as needed.  . nortriptyline (PAMELOR) 25 MG capsule Take 1 capsule (25 mg total) by mouth at bedtime.  . verapamil (CALAN-SR) 120 MG CR tablet   . [DISCONTINUED] citalopram (CELEXA) 40 MG tablet Take 1 tablet (40 mg total) by mouth daily.  . [DISCONTINUED] nortriptyline (PAMELOR) 25 MG capsule Take 1 capsule (25 mg total) by mouth at bedtime.   No facility-administered encounter medications on file as of 06/28/2015.    Past Psychiatric History/Hospitalization(s): Patient has a long history of depression.  In the past he has taken Prozac, Paxil, Zoloft, Abilify and Cymbalta from his primary care physician.  Patient has at least one suicidal attempt 2011 when he took overdose on his hypertensive medication and admitted at [facility].  His last psychiatric admission was December 2013 at [facility] when he was feeling very depressed , having suicidal thoughts and unable to function.  He was taking Cymbalta 60 mg given by primary care physician.  Anxiety: Yes Bipolar Disorder: No Depression: Yes Mania: No Psychosis: No Schizophrenia: No Personality Disorder: No Hospitalization for psychiatric illness: Yes History of Electroconvulsive Shock Therapy: No Prior Suicide Attempts: Yes  Physical Exam: Constitutional:  BP 134/74 mmHg  Pulse 98  Ht  (1.651 m)  Wt 240 lb 9.6 oz (109.135 kg)  BMI 40.04 kg/m2  General Appearance: well nourished and obese  Musculoskeletal: Strength & Muscle Tone: within normal limits Gait & Station: normal Patient leans:  N/A  Psychiatric: Speech (describe rate, volume, coherence, spontaneity, and abnormalities if any): High-pitched voice but clear and coherent.   Thought Process (describe rate, content, abstract reasoning, and computation): Logical organized and goal-directed.  Associations: Coherent  and Relevant.  His fund of knowledge is adequate.  Thoughts: normal  Mental Status: Orientation: oriented to person, place, time/date and situation Mood & Affect: normal affect Attention Span & Concentration: Fair  Established Problem, Stable/Improving (1), Review of Psycho-Social Stressors (1), Review of Last Therapy Session (1) and Review of Medication Regimen & Side Effects (2)  Assessment: Axis I: Maj. depressive disorder recurrent, anxiety disorder NOS  Axis II: Deferred  Axis III: See medical history   Plan:  Patient is a stable on Celexa and nortriptyline.  He's taking Vistaril only as needed which is helping her sleep.  Discussed medication side effects and benefits.  Recommended to call us back if he has any question or any concern.  He has no tremors, shakes or any EPS.  Follow-up in 3 months.   Bich Mchaney T., MD 06/28/2015

## 2015-10-02 ENCOUNTER — Ambulatory Visit (HOSPITAL_COMMUNITY): Payer: Self-pay | Admitting: Psychiatry

## 2015-10-05 ENCOUNTER — Ambulatory Visit (INDEPENDENT_AMBULATORY_CARE_PROVIDER_SITE_OTHER): Payer: Medicare Other | Admitting: Psychiatry

## 2015-10-05 ENCOUNTER — Encounter (HOSPITAL_COMMUNITY): Payer: Self-pay | Admitting: Psychiatry

## 2015-10-05 VITALS — BP 140/88 | HR 75 | Ht 65.0 in | Wt 232.8 lb

## 2015-10-05 DIAGNOSIS — F330 Major depressive disorder, recurrent, mild: Secondary | ICD-10-CM | POA: Diagnosis not present

## 2015-10-05 DIAGNOSIS — F419 Anxiety disorder, unspecified: Secondary | ICD-10-CM

## 2015-10-05 DIAGNOSIS — Z79899 Other long term (current) drug therapy: Secondary | ICD-10-CM

## 2015-10-05 MED ORDER — CITALOPRAM HYDROBROMIDE 40 MG PO TABS: 40.0000 mg | ORAL_TABLET | Freq: Every day | ORAL | Status: DC

## 2015-10-05 MED ORDER — NORTRIPTYLINE HCL 25 MG PO CAPS: 25.0000 mg | ORAL_CAPSULE | Freq: Every day | ORAL | Status: DC

## 2015-10-05 NOTE — Progress Notes (Signed)
[name] [id number] Progress Note  Raymond [name] [id number] 64 y.o.  10/05/2015 5:00 PM  Chief Complaint:  Medication management and followup.  History of Present Illness: [name] came for his followup appointment.  He is a stable on his current medication.  He has been involved in multiple home renovation projects and keeping himself busy.  Recently his wife has reportedly replacement had he is taking care of his wife.  He also do babysitting most of the time.  He described his mood is stable.  He denies any crying spells, irritability, anger, mood swing or any feeling of hopelessness or worthlessness.  His sleep is good.  He like to continue nortriptyline and Celexa as prescribed.  He has no rash, itching, tremors or any side effects.  His energy level is good.  His vitals are stable.  He takes Vistaril only as needed.  He does not need a new prescription of Vistaril.  He denies any drinking or using any illegal substances.  Patient lives with his wife and his mother-in-law.  Suicidal Ideation: No Plan Formed: No Patient has means to carry out plan: No  Homicidal Ideation: No Plan Formed: No Patient has means to carry out plan: No  Review of Systems  Constitutional: Negative.   Skin: Negative.   Neurological: Negative.  Negative for headaches.   Psychiatric: Agitation: No Hallucination: No Depressed Mood: No Insomnia: No Hypersomnia: No Altered Concentration: No Feels Worthless: No Grandiose Ideas: No Belief In Special Powers: No New/Increased Substance Abuse: No Compulsions: No  Neurologic: Headache: No Seizure: No Paresthesias: No  Medical history. Patient has history of hypertension, obesity, sleep apnea, migraine headache.  His primary care physician is Ned Card in [facility].   Outpatient Encounter Prescriptions as of 10/05/2015  Medication Sig  . citalopram (CELEXA) 40 MG tablet Take 1 tablet (40 mg total) by mouth daily.  . hydrOXYzine  (ATARAX/VISTARIL) 50 MG tablet Take 1 tablet (50 mg total) by mouth at bedtime.  Marland Kitchen losartan-hydrochlorothiazide (HYZAAR) 100-25 MG per tablet Take 1 tablet by mouth daily.  . naproxen (NAPROSYN) 500 MG tablet Take 1 tablet (500 mg total) by mouth 3 (three) times daily as needed.  . nortriptyline (PAMELOR) 25 MG capsule Take 1 capsule (25 mg total) by mouth at bedtime.  . verapamil (CALAN-SR) 120 MG CR tablet   . [DISCONTINUED] citalopram (CELEXA) 40 MG tablet Take 1 tablet (40 mg total) by mouth daily.  . [DISCONTINUED] nortriptyline (PAMELOR) 25 MG capsule Take 1 capsule (25 mg total) by mouth at bedtime.   No facility-administered encounter medications on file as of 10/05/2015.    Past Psychiatric History/Hospitalization(s): Patient has a long history of depression.  In the past he has taken Prozac, Paxil, Zoloft, Abilify and Cymbalta from his primary care physician.  Patient has at least one suicidal attempt 2011 when he took overdose on his hypertensive medication and admitted at [facility].  His last psychiatric admission was December 2013 at [facility] when he was feeling very depressed , having suicidal thoughts and unable to function.  He was taking Cymbalta 60 mg given by primary care physician.  Anxiety: Yes Bipolar Disorder: No Depression: Yes Mania: No Psychosis: No Schizophrenia: No Personality Disorder: No Hospitalization for psychiatric illness: Yes History of Electroconvulsive Shock Therapy: No Prior Suicide Attempts: Yes  Physical Exam: Constitutional:  BP 140/88 mmHg  Pulse 75  Ht  (1.651 m)  Wt 232 lb 12.8 oz (105.597 kg)  BMI 38.74 kg/m2  General Appearance: well nourished and obese  Musculoskeletal: Strength & Muscle Tone: within normal limits Gait & Station: normal Patient leans: N/A  Psychiatric: Speech (describe rate, volume, coherence, spontaneity, and abnormalities if any): High-pitched voice but clear  and coherent.   Thought Process (describe rate, content, abstract reasoning, and computation): Logical organized and goal-directed.  Associations: Coherent and Relevant.  His fund of knowledge is adequate.  Thoughts: normal  Mental Status: Orientation: oriented to person, place, time/date and situation Mood & Affect: normal affect Attention Span & Concentration: Fair  Established Problem, Stable/Improving (1), Review of Psycho-Social Stressors (1), Review of Last Therapy Session (1) and Review of Medication Regimen & Side Effects (2)  Assessment: Axis I: Maj. depressive disorder recurrent, anxiety disorder NOS  Axis II: Deferred  Axis III: See medical history   Plan:  Patient is a stable on Celexa and nortriptyline.  He's taking Vistaril only as needed which is helping her sleep.  Discussed medication side effects and benefits.  Recommended to call us back if he has any question or any concern.  He has no tremors, shakes or any EPS.  Follow-up in 3 months.   Armya Westerhoff T., MD 10/05/2015

## 2015-10-09 ENCOUNTER — Telehealth (HOSPITAL_COMMUNITY): Payer: Self-pay

## 2015-10-11 LAB — CBC WITH DIFFERENTIAL/PLATELET
BASOS ABS: 0 10*3/uL (ref 0.0–0.2)
Basos: 1 %
EOS (ABSOLUTE): 0.2 10*3/uL (ref 0.0–0.4)
Eos: 3 %
HEMOGLOBIN: 15.5 g/dL (ref 12.6–17.7)
Hematocrit: 46.3 % (ref 37.5–51.0)
IMMATURE GRANS (ABS): 0 10*3/uL (ref 0.0–0.1)
Immature Granulocytes: 0 %
LYMPHS ABS: 2.1 10*3/uL (ref 0.7–3.1)
LYMPHS: 35 %
MCH: 30.2 pg (ref 26.6–33.0)
MCHC: 33.5 g/dL (ref 31.5–35.7)
MCV: 90 fL (ref 79–97)
MONOCYTES: 7 %
Monocytes Absolute: 0.4 10*3/uL (ref 0.1–0.9)
NEUTROS ABS: 3.3 10*3/uL (ref 1.4–7.0)
Neutrophils: 54 %
PLATELETS: 178 10*3/uL (ref 150–379)
RBC: 5.14 x10E6/uL (ref 4.14–5.80)
RDW: 13.7 % (ref 12.3–15.4)
WBC: 6 10*3/uL (ref 3.4–10.8)

## 2015-10-11 LAB — COMPREHENSIVE METABOLIC PANEL
ALBUMIN: 4 g/dL (ref 3.6–4.8)
ALK PHOS: 38 IU/L — AB (ref 39–117)
ALT: 23 IU/L (ref 0–44)
AST: 15 IU/L (ref 0–40)
Albumin/Globulin Ratio: 1.9 (ref 1.1–2.5)
BUN / CREAT RATIO: 9 — AB (ref 10–22)
BUN: 10 mg/dL (ref 8–27)
Bilirubin Total: 0.5 mg/dL (ref 0.0–1.2)
CALCIUM: 9.1 mg/dL (ref 8.6–10.2)
CO2: 26 mmol/L (ref 18–29)
CREATININE: 1.12 mg/dL (ref 0.76–1.27)
Chloride: 94 mmol/L — ABNORMAL LOW (ref 97–106)
GFR calc Af Amer: 79 mL/min/{1.73_m2} (ref >59)
GFR, EST NON AFRICAN AMERICAN: 69 mL/min/{1.73_m2} (ref >59)
GLOBULIN, TOTAL: 2.1 g/dL (ref 1.5–4.5)
GLUCOSE: 423 mg/dL — AB (ref 65–99)
Potassium: 3.7 mmol/L (ref 3.5–5.2)
SODIUM: 136 mmol/L (ref 136–144)
TOTAL PROTEIN: 6.1 g/dL (ref 6.0–8.5)

## 2015-10-11 LAB — HEMOGLOBIN A1C
Est. average glucose Bld gHb Est-mCnc: 249 mg/dL
HEMOGLOBIN A1C: 10.3 % — AB (ref 4.8–5.6)

## 2015-10-11 LAB — AMBIG ABBREV CMP14 DEFAULT

## 2015-10-11 LAB — TSH: TSH: 1.2 u[IU]/mL (ref 0.450–4.500)

## 2015-10-12 LAB — CMP14+EGFR
ALK PHOS: 40 IU/L (ref 39–117)
ALT: 21 IU/L (ref 0–44)
AST: 18 IU/L (ref 0–40)
Albumin/Globulin Ratio: 2.3 (ref 1.1–2.5)
Albumin: 4.2 g/dL (ref 3.6–4.8)
BUN/Creatinine Ratio: 9 — ABNORMAL LOW (ref 10–22)
BUN: 11 mg/dL (ref 8–27)
Bilirubin Total: 0.4 mg/dL (ref 0.0–1.2)
CO2: 20 mmol/L (ref 18–29)
CREATININE: 1.2 mg/dL (ref 0.76–1.27)
Calcium: 9.2 mg/dL (ref 8.6–10.2)
Chloride: 93 mmol/L — ABNORMAL LOW (ref 97–106)
GFR calc Af Amer: 73 mL/min/{1.73_m2} (ref >59)
GFR calc non Af Amer: 63 mL/min/{1.73_m2} (ref >59)
GLOBULIN, TOTAL: 1.8 g/dL (ref 1.5–4.5)
Glucose: 435 mg/dL — ABNORMAL HIGH (ref 65–99)
POTASSIUM: 4.2 mmol/L (ref 3.5–5.2)
SODIUM: 137 mmol/L (ref 136–144)
Total Protein: 6 g/dL (ref 6.0–8.5)

## 2015-10-12 LAB — SPECIMEN STATUS REPORT

## 2016-01-08 ENCOUNTER — Ambulatory Visit (INDEPENDENT_AMBULATORY_CARE_PROVIDER_SITE_OTHER): Payer: Medicare Other | Admitting: Psychiatry

## 2016-01-08 ENCOUNTER — Encounter (HOSPITAL_COMMUNITY): Payer: Self-pay | Admitting: Psychiatry

## 2016-01-08 VITALS — BP 126/85 | HR 69 | Ht 65.0 in | Wt 225.2 lb

## 2016-01-08 DIAGNOSIS — F330 Major depressive disorder, recurrent, mild: Secondary | ICD-10-CM

## 2016-01-08 MED ORDER — HYDROXYZINE HCL 50 MG PO TABS: 50.0000 mg | ORAL_TABLET | Freq: Every evening | ORAL | Status: DC | PRN

## 2016-01-08 MED ORDER — NORTRIPTYLINE HCL 25 MG PO CAPS: 25.0000 mg | ORAL_CAPSULE | Freq: Every day | ORAL | Status: DC

## 2016-01-08 MED ORDER — CITALOPRAM HYDROBROMIDE 40 MG PO TABS: 40.0000 mg | ORAL_TABLET | Freq: Every day | ORAL | Status: DC

## 2016-01-08 NOTE — Progress Notes (Signed)
[facility] [id number] Progress Note  Raymond [name] [id number] 65 y.o.  01/08/2016 9:50 AM  Chief Complaint:  I have diabetes.  I'm taking metformin.  I'm trying to lose weight.  History of Present Illness:  [name] came for his follow-up appointment.  He was recently diagnosed with diabetes.  He start taking metformin 3 weeks ago.  Patient told he was experiencing lethargy, vertigo, increase polydipsia and he had appointment with his primary care physician.  We have done blood work in December and his hemoglobin A1c is 10.6.  His primary care physician also repeated to blood work and started him on metformin.  He is taking metformin 500 mg 3 times a day.  He endorsed since taking the medication his blood pressure is getting slowly better.  He is checking his blood pressure every day.  He also lost weight.  He endorse in the beginning he was very anxious and sad after he was told that he has diabetes but now things are getting better.  He is trying to cope his illness better.  He is keeping himself busy.  He continues to do home renovation and he recently finished painting.  He denies any irritability, anger, paranoia or any hallucination.  He denies any feeling of hopelessness.  He sleeping good.  Sometime he takes Vistaril which helps his anxiety and insomnia.  He had a good Christmas.  His wife is also doing good.  His mother lives in [place] and he usually goes there to visit her.  Patient denies any aggressive behavior.  His energy level is better.  He still have some time vertigo and he is given meclizine and doxepin but it did not work and he stopped.  He is scheduled to see ENT for MRI and further workup.  Patient denies drinking or using any illegal substances.  He is taking Celexa and nortriptyline.  He denies any tremors shakes or any EPS.  Suicidal Ideation: No Plan Formed: No Patient has means to carry out plan: No  Homicidal Ideation: No Plan Formed: No Patient has means to carry  out plan: No  Medical History; Patient has hypertension, obesity, sleep apnea, migraine headaches and diabetes.  Recently he had vertigo and he had tried meclizine and doxepin with limited help.  His primary care physician is Dr. Ned CardSteven Rulley in [facility].  Family History; Patient denies any family history of psychiatric illness.  Past Psychiatric History/Hospitalization(s) Patient has long history of depression which he believe do to a difficult marriage. He's been married for 40 years. But he believed that he is struggling in his marriage for past 20 years. He has taken Prozac, Paxil, Zoloft, Abilify and Cymbalta from his primary care physician in [facility]. Patient also endorse seeing psychiatrist once at [facility] but did not continue with psychiatrist. He has one suicidal attempt 2 years ago when he took overdose on his hypertensive medication and admitted at [facility]. His last [facility] was December 2013 at [facility]. Patient denies any history of mania psychosis hallucination or violence.  Anxiety: Yes Bipolar Disorder: No Depression: Yes Mania: No Psychosis: No Schizophrenia: No Personality Disorder: No Hospitalization for psychiatric illness: Yes History of Electroconvulsive Shock Therapy: No Prior Suicide Attempts: Yes   Review of Systems  Constitutional: Positive for weight loss. Negative for malaise/fatigue.  HENT:       Vertigo  Cardiovascular: Negative for chest pain.  Musculoskeletal: Negative.   Neurological: Negative for tremors and headaches.  Psychiatric/Behavioral: Negative for suicidal ideas.    Psychiatric: Agitation: No Hallucination: No Depressed Mood: No Insomnia: No Hypersomnia: No Altered Concentration: No Feels Worthless: No Grandiose Ideas: No Belief In Special Powers: No New/Increased Substance Abuse: No Compulsions: No  Neurologic: Headache: No Seizure: No Paresthesias:  No  Outpatient Encounter Prescriptions as of 01/08/2016  Medication Sig  . metFORMIN (GLUCOPHAGE) 500 MG tablet Take 500 mg by mouth 3 (three) times daily.  . citalopram (CELEXA) 40 MG tablet Take 1 tablet (40 mg total) by mouth daily.  . hydrOXYzine (ATARAX/VISTARIL) 50 MG tablet Take 1 tablet (50 mg total) by mouth at bedtime as needed for anxiety.  Marland Kitchen losartan-hydrochlorothiazide (HYZAAR) 100-25 MG per tablet Take 1 tablet by mouth daily.  . naproxen (NAPROSYN) 500 MG tablet Take 1 tablet (500 mg total) by mouth 3 (three) times daily as needed.  . nortriptyline (PAMELOR) 25 MG capsule Take 1 capsule (25 mg total) by mouth at bedtime.  . verapamil (CALAN-SR) 120 MG CR tablet   . [DISCONTINUED] citalopram (CELEXA) 40 MG tablet Take 1 tablet (40 mg total) by mouth daily.  . [DISCONTINUED] hydrOXYzine (ATARAX/VISTARIL) 50 MG tablet Take 1 tablet (50 mg total) by mouth at bedtime.  . [DISCONTINUED] nortriptyline (PAMELOR) 25 MG capsule Take 1 capsule (25 mg total) by mouth at bedtime.   No facility-administered encounter medications on file as of 01/08/2016.    No results found for this or any previous visit (from the past 2160 hour(s)).  Physical Exam: Consitutional ;  BP 126/85 mmHg  Pulse 69  Ht  (1.651 m)  Wt 225 lb 3.2 oz (102.15 kg)  BMI 37.48 kg/m2  Musculoskeletal: Strength & Muscle Tone: within normal limits Gait & Station: normal Patient leans: N/A   Psychiatric Specialty Exam: General Appearance: Casual and Fairly Groomed  Patent attorney::  Good  Speech:  Clear and Coherent  Volume:  Normal  Mood:  Euthymic  Affect:  Appropriate  Thought Process:  Coherent  Orientation:  Full (Time, Place, and Person)  Thought Content:  WDL  Suicidal Thoughts:  No  Homicidal Thoughts:  No  Memory:  Immediate;   Fair Recent;   Fair Remote;   Fair  Judgement:  Good  Insight:  Fair  Psychomotor Activity:  Normal  Concentration:  Fair  Recall:  Fair  Fund of Knowledge:  Good   Language:  Good  Akathisia:  No  Handed:  Right  AIMS (if indicated):     Assets:  Communication Skills Desire for Improvement Financial Resources/Insurance Housing Physical Health Social Support  ADL's:  Intact  Cognition:  WNL  Sleep:        Established Problem, Stable/Improving (1), Review of Psycho-Social Stressors (1), Review or order clinical lab tests (1), Review and summation of old records (2), Review of Last Therapy Session (1), Review of Medication Regimen & Side Effects (2) and Review of New Medication or Change in Dosage (2)  Assessment: Axis I:  Major depressive disorder, recurrent.  Anxiety disorder NOS  Axis III:  Past Medical History  Diagnosis Date  . Hypertension   . Migraines   . Sleep apnea   . Depression   . Arthritis   . Diabetes mellitus type I (HCC)     Plan:  I review his symptoms, history, current medication.  I also review his previous records and recent blood work results and hemoglobin A1c.  Discussed medication side effects and benefits.  Patient has been compliant with Celexa 40 mg daily and  nortriptyline 25 mg at bedtime.  He has no tremors shakes or any EPS.  He has no serotonin syndrome symptoms.  He has occasional vertigo and he is scheduled to see ENT for further workup.  He is taking hydroxyzine as needed for insomnia.  I will continue current dose of Celexa and nortriptyline .  Recommended to call us back if he has any question or any concern.  Encourage regular exercise and watching his diet .  Follow-up in 3 months.  Discuss safety plan that anytime having active suicidal thoughts or homicidal thought and he need to call 911 or go to the local emergency room.   Alysen Smylie T., MD 01/08/2016

## 2016-03-20 ENCOUNTER — Other Ambulatory Visit (HOSPITAL_COMMUNITY): Payer: Self-pay | Admitting: Psychiatry

## 2016-03-21 ENCOUNTER — Other Ambulatory Visit (HOSPITAL_COMMUNITY): Payer: Self-pay | Admitting: Psychiatry

## 2016-03-27 ENCOUNTER — Other Ambulatory Visit (HOSPITAL_COMMUNITY): Payer: Self-pay | Admitting: Psychiatry

## 2016-04-10 ENCOUNTER — Ambulatory Visit (HOSPITAL_COMMUNITY): Payer: Self-pay | Admitting: Psychiatry

## 2016-04-22 ENCOUNTER — Ambulatory Visit (HOSPITAL_COMMUNITY): Payer: Self-pay | Admitting: Psychiatry

## 2016-04-23 ENCOUNTER — Emergency Department (HOSPITAL_BASED_OUTPATIENT_CLINIC_OR_DEPARTMENT_OTHER)
Admission: EM | Admit: 2016-04-23 | Discharge: 2016-04-23 | Disposition: A | Discharge status: Home / Self Care | Payer: Medicare Other | Attending: Emergency Medicine | Admitting: Emergency Medicine

## 2016-04-23 ENCOUNTER — Encounter (HOSPITAL_BASED_OUTPATIENT_CLINIC_OR_DEPARTMENT_OTHER): Payer: Self-pay | Admitting: *Deleted

## 2016-04-23 ENCOUNTER — Emergency Department (HOSPITAL_BASED_OUTPATIENT_CLINIC_OR_DEPARTMENT_OTHER): Payer: Medicare Other

## 2016-04-23 DIAGNOSIS — S8992XA Unspecified injury of left lower leg, initial encounter: Secondary | ICD-10-CM | POA: Diagnosis present

## 2016-04-23 DIAGNOSIS — Y999 Unspecified external cause status: Secondary | ICD-10-CM | POA: Insufficient documentation

## 2016-04-23 DIAGNOSIS — F17210 Nicotine dependence, cigarettes, uncomplicated: Secondary | ICD-10-CM | POA: Diagnosis not present

## 2016-04-23 DIAGNOSIS — E109 Type 1 diabetes mellitus without complications: Secondary | ICD-10-CM | POA: Insufficient documentation

## 2016-04-23 DIAGNOSIS — M1990 Unspecified osteoarthritis, unspecified site: Secondary | ICD-10-CM | POA: Diagnosis not present

## 2016-04-23 DIAGNOSIS — Z79899 Other long term (current) drug therapy: Secondary | ICD-10-CM | POA: Insufficient documentation

## 2016-04-23 DIAGNOSIS — W108XXA Fall (on) (from) other stairs and steps, initial encounter: Secondary | ICD-10-CM | POA: Diagnosis not present

## 2016-04-23 DIAGNOSIS — Y939 Activity, unspecified: Secondary | ICD-10-CM | POA: Diagnosis not present

## 2016-04-23 DIAGNOSIS — S8012XA Contusion of left lower leg, initial encounter: Secondary | ICD-10-CM | POA: Insufficient documentation

## 2016-04-23 DIAGNOSIS — F329 Major depressive disorder, single episode, unspecified: Secondary | ICD-10-CM | POA: Insufficient documentation

## 2016-04-23 DIAGNOSIS — Y929 Unspecified place or not applicable: Secondary | ICD-10-CM | POA: Insufficient documentation

## 2016-04-23 DIAGNOSIS — I10 Essential (primary) hypertension: Secondary | ICD-10-CM | POA: Diagnosis not present

## 2016-04-23 DIAGNOSIS — Z7984 Long term (current) use of oral hypoglycemic drugs: Secondary | ICD-10-CM | POA: Insufficient documentation

## 2016-04-23 DIAGNOSIS — M7672 Peroneal tendinitis, left leg: Secondary | ICD-10-CM | POA: Diagnosis not present

## 2016-04-23 MED ORDER — ACETAMINOPHEN 500 MG PO TABS: 1000.0000 mg | ORAL_TABLET | Freq: Four times a day (QID) | ORAL | Status: DC | PRN

## 2016-04-23 NOTE — ED Provider Notes (Signed)
CSN: [id number]     Arrival date & time 04/23/16  1919 History  By signing my name below, I, Cataract And Laser InstituteMarrissa [name], attest that this documentation has been prepared under the direction and in the presence of Raymond BarretteMarcy Allie Gerhold, MD. Electronically Signed: Randell PatientMarrissa [name], ED Scribe. 04/23/2016. 11:09 PM.   Chief Complaint  Patient presents with  . Leg Injury   The history is provided by the patient. No language interpreter was used.   HPI Comments: Raymond [name] is a 65 y.o. male with an hx of HTN and DM who presents to the Emergency Department complaining of constant, burning, gradually worsening left lower leg pain that extends from his mid-calf to his ankle onset 10 days ago. Pt states that that he tripped and fell down a set of brick steps (3 steps in all) followed by mild pain that has increased in severity over the past 1.5 weeks. Pain is unchanged by ambulation. He took OTC medication once 9 days ago and has been wearing tennis shoes, resting, icing, and elevating the affected area without relief. Per pt, he was diagnosed with DM 6 months ago. Denies taking pain medication since pain increased. Denies back pain or any other symptoms currently.  Past Medical History  Diagnosis Date  . Hypertension   . Migraines   . Sleep apnea   . Depression   . Arthritis   . Diabetes mellitus type I [facility](HCC)    Past Surgical History  Procedure Laterality Date  . Knee arthroscopy    . Rotator cuff repair     No family history on file. Social History  Substance Use Topics  . Smoking status: Light Tobacco Smoker    Types: Cigars  . Smokeless tobacco: Never Used  . Alcohol Use: No    Review of Systems  Musculoskeletal: Positive for myalgias. Negative for back pain.      Allergies  Flomax and Lopressor  Home Medications   Prior to Admission medications   Medication Sig Start Date End Date Taking? Authorizing Provider  acetaminophen (TYLENOL) 500 MG tablet Take 2 tablets (1,000 mg total) by  mouth every 6 (six) hours as needed. 04/23/16   Raymond BarretteMarcy Tamekia Rotter, MD  citalopram (CELEXA) 40 MG tablet Take 1 tablet (40 mg total) by mouth daily. 01/08/16   Cleotis NipperSyed T Arfeen, MD  hydrOXYzine (ATARAX/VISTARIL) 50 MG tablet Take 1 tablet (50 mg total) by mouth at bedtime as needed for anxiety. 01/08/16   Cleotis NipperSyed T Arfeen, MD  losartan-hydrochlorothiazide (HYZAAR) 100-25 MG per tablet Take 1 tablet by mouth daily.    Historical Provider, MD  metFORMIN (GLUCOPHAGE) 500 MG tablet Take 500 mg by mouth 3 (three) times daily. 11/21/15   Historical Provider, MD  naproxen (NAPROSYN) 500 MG tablet Take 1 tablet (500 mg total) by mouth 3 (three) times daily as needed. 10/26/12   Charm RingsJamison Y Lord, NP  nortriptyline (PAMELOR) 25 MG capsule Take 1 capsule (25 mg total) by mouth at bedtime. 01/08/16   Cleotis NipperSyed T Arfeen, MD  verapamil (CALAN-SR) 120 MG CR tablet  11/11/14   Historical Provider, MD   BP 181/73 mmHg  Pulse 58  Temp(Src) 98.8 F (37.1 C) (Oral)  Resp 18  Ht 5\' 5"  (1.651 m)  Wt 216 lb (97.977 kg)  BMI 35.94 kg/m2  SpO2 98% Physical Exam  Constitutional: He is oriented to person, place, and time. He appears well-developed and well-nourished. No distress.  HENT:  Head: Normocephalic and atraumatic.  Pulmonary/Chest: Effort normal. No respiratory distress.  Musculoskeletal: Normal  range of motion. He exhibits edema and tenderness.  Left lower extremity: No knee effusion, normal range of motion of the knee. Nearly healed abrasions to lower leg laterally. No surrounding erythema. Small dried eschar. Patient has some focal tenderness to the superior aspect of the lateral malleolus in the area of the peroneal tendon sheath. There is no erythema present there is very small amount of edema just posterior and superior to the lateral malleolus. There is no point tenderness on the malleolus. Foot is in good condition without edema, erythema. The calf is soft and nontender.  Neurological: He is alert and oriented to person, place,  and time.  Skin: Skin is warm and dry.  Psychiatric: He has a normal mood and affect. His behavior is normal.  Nursing note and vitals reviewed.   ED Course  Procedures   DIAGNOSTIC STUDIES: Oxygen Saturation is 98% on RA, normal by my interpretation.    COORDINATION OF CARE: 10:59 PM Advised at home symptomatic treatment with OTC pain medication, rest, ice, and elevation. Will provide pt with a referral to an orthopedist. Advised pt to follow-up with PCP or orthopedist as needed. Will discharge pt. Discussed treatment plan with pt at bedside and pt agreed to plan.   Imaging Review Dg Tibia/fibula Left  04/23/2016  CLINICAL DATA:  65 year old male with fall 10 days ago and worsening pain. EXAM: LEFT TIBIA AND FIBULA - 2 VIEW COMPARISON:  None. FINDINGS: There is no acute fracture or dislocation. The bones are well mineralized. Soft tissues appear unremarkable. IMPRESSION: Negative. Electronically Signed   By: Elgie Collard M.D.   On: 04/23/2016 21:38   I have personally reviewed and evaluated these images as part of my medical decision-making.    MDM   Final diagnoses:  Contusion, lower leg, left, initial encounter  Peroneal tendinitis of left lower extremity   Patient had contusion and abrasion to the lower extremity approximately 10 days ago. The wound is healing very well. There is no evidence of cellulitis or associated infection. Family members had some concern for possible blood clot. At this time no evidence of DVT. Patients pain is on the lateral leg. It has burning quality. I suspect pain may be secondary to gait asymmetry from his injury and now the patient developed some perineal tendinitis. Other condition is possible nerve contusion that is now resolving. Patient instructed to continue elevating and icing it he will take Tylenol for pain. He is advised to follow-up with Dr. Pearletha Forge.   Raymond Barrette, MD 04/23/16 [phone]

## 2016-04-23 NOTE — ED Notes (Signed)
States his pain got worse while he was waiting for an exam room. He applied ice and it improved his pain.

## 2016-04-23 NOTE — ED Notes (Addendum)
He tripped and fell down 3 steps 10 days ago. Abrasions to his left arm have healed. Pain in his lower leg is worse. Healing abrasion noted.

## 2016-04-23 NOTE — Discharge Instructions (Signed)
Contusion °A contusion is a deep bruise. Contusions are the result of a blunt injury to tissues and muscle fibers under the skin. The injury causes bleeding under the skin. The skin overlying the contusion may turn blue, purple, or yellow. Minor injuries will give you a painless contusion, but more severe contusions may stay painful and swollen for a few weeks.  °CAUSES  °This condition is usually caused by a blow, trauma, or direct force to an area of the body. °SYMPTOMS  °Symptoms of this condition include: °· Swelling of the injured area. °· Pain and tenderness in the injured area. °· Discoloration. The area may have redness and then turn blue, purple, or yellow. °DIAGNOSIS  °This condition is diagnosed based on a physical exam and medical history. An X-ray, CT scan, or MRI may be needed to determine if there are any associated injuries, such as broken bones (fractures). °TREATMENT  °Specific treatment for this condition depends on what area of the body was injured. In general, the best treatment for a contusion is resting, icing, applying pressure to (compression), and elevating the injured area. This is often called the RICE strategy. Over-the-counter anti-inflammatory medicines may also be recommended for pain control.  °HOME CARE INSTRUCTIONS  °· Rest the injured area. °· If directed, apply ice to the injured area: °¨ Put ice in a plastic bag. °¨ Place a towel between your skin and the bag. °¨ Leave the ice on for 20 minutes, 2-3 times per day. °· If directed, apply light compression to the injured area using an elastic bandage. Make sure the bandage is not wrapped too tightly. Remove and reapply the bandage as directed by your health care provider. °· If possible, raise (elevate) the injured area above the level of your heart while you are sitting or lying down. °· Take over-the-counter and prescription medicines only as told by your health care provider. °SEEK MEDICAL CARE IF: °· Your symptoms do not  improve after several days of treatment. °· Your symptoms get worse. °· You have difficulty moving the injured area. °SEEK IMMEDIATE MEDICAL CARE IF:  °· You have severe pain. °· You have numbness in a hand or foot. °· Your hand or foot turns pale or cold. °  °This information is not intended to replace advice given to you by your health care provider. Make sure you discuss any questions you have with your health care provider. °  ° °

## 2016-05-24 ENCOUNTER — Emergency Department (HOSPITAL_BASED_OUTPATIENT_CLINIC_OR_DEPARTMENT_OTHER): Payer: Medicare Other

## 2016-05-24 ENCOUNTER — Emergency Department (HOSPITAL_BASED_OUTPATIENT_CLINIC_OR_DEPARTMENT_OTHER)
Admission: EM | Admit: 2016-05-24 | Discharge: 2016-05-24 | Disposition: A | Discharge status: Home / Self Care | Payer: Medicare Other | Attending: Emergency Medicine | Admitting: Emergency Medicine

## 2016-05-24 ENCOUNTER — Encounter (HOSPITAL_BASED_OUTPATIENT_CLINIC_OR_DEPARTMENT_OTHER): Payer: Self-pay | Admitting: Emergency Medicine

## 2016-05-24 DIAGNOSIS — Z79899 Other long term (current) drug therapy: Secondary | ICD-10-CM | POA: Diagnosis not present

## 2016-05-24 DIAGNOSIS — R51 Headache: Secondary | ICD-10-CM | POA: Insufficient documentation

## 2016-05-24 DIAGNOSIS — R112 Nausea with vomiting, unspecified: Secondary | ICD-10-CM | POA: Insufficient documentation

## 2016-05-24 DIAGNOSIS — E109 Type 1 diabetes mellitus without complications: Secondary | ICD-10-CM | POA: Diagnosis not present

## 2016-05-24 DIAGNOSIS — Z7984 Long term (current) use of oral hypoglycemic drugs: Secondary | ICD-10-CM | POA: Diagnosis not present

## 2016-05-24 DIAGNOSIS — F17210 Nicotine dependence, cigarettes, uncomplicated: Secondary | ICD-10-CM | POA: Insufficient documentation

## 2016-05-24 DIAGNOSIS — I10 Essential (primary) hypertension: Secondary | ICD-10-CM | POA: Insufficient documentation

## 2016-05-24 DIAGNOSIS — F329 Major depressive disorder, single episode, unspecified: Secondary | ICD-10-CM | POA: Diagnosis not present

## 2016-05-24 DIAGNOSIS — R519 Headache, unspecified: Secondary | ICD-10-CM

## 2016-05-24 LAB — COMPREHENSIVE METABOLIC PANEL
ALBUMIN: 4.5 g/dL (ref 3.5–5.0)
ALT: 19 U/L (ref 17–63)
ANION GAP: 11 (ref 5–15)
AST: 19 U/L (ref 15–41)
Alkaline Phosphatase: 34 U/L — ABNORMAL LOW (ref 38–126)
BILIRUBIN TOTAL: 0.7 mg/dL (ref 0.3–1.2)
BUN: 20 mg/dL (ref 6–20)
CHLORIDE: 102 mmol/L (ref 101–111)
CO2: 25 mmol/L (ref 22–32)
Calcium: 9.1 mg/dL (ref 8.9–10.3)
Creatinine, Ser: 0.95 mg/dL (ref 0.61–1.24)
GFR calc Af Amer: >60 mL/min (ref >60)
GLUCOSE: 169 mg/dL — AB (ref 65–99)
POTASSIUM: 3.4 mmol/L — AB (ref 3.5–5.1)
Sodium: 138 mmol/L (ref 135–145)
TOTAL PROTEIN: 7.3 g/dL (ref 6.5–8.1)

## 2016-05-24 LAB — CBC
HEMATOCRIT: 46.4 % (ref 39.0–52.0)
Hemoglobin: 16.6 g/dL (ref 13.0–17.0)
MCH: 30.3 pg (ref 26.0–34.0)
MCHC: 35.8 g/dL (ref 30.0–36.0)
MCV: 84.7 fL (ref 78.0–100.0)
PLATELETS: 181 10*3/uL (ref 150–400)
RBC: 5.48 MIL/uL (ref 4.22–5.81)
RDW: 12.9 % (ref 11.5–15.5)
WBC: 11.7 10*3/uL — ABNORMAL HIGH (ref 4.0–10.5)

## 2016-05-24 MED ORDER — DEXAMETHASONE SODIUM PHOSPHATE 10 MG/ML IJ SOLN
10.0000 mg | Freq: Once | INTRAMUSCULAR | Status: AC
  Administered 2016-05-24: 10 mg via INTRAVENOUS
  Filled 2016-05-24: qty 1

## 2016-05-24 MED ORDER — SODIUM CHLORIDE 0.9 % IV BOLUS (SEPSIS)
1000.0000 mL | Freq: Once | INTRAVENOUS | Status: AC
  Administered 2016-05-24: 1000 mL via INTRAVENOUS

## 2016-05-24 MED ORDER — METOCLOPRAMIDE HCL 5 MG/ML IJ SOLN
10.0000 mg | Freq: Once | INTRAMUSCULAR | Status: AC
  Administered 2016-05-24: 10 mg via INTRAVENOUS
  Filled 2016-05-24: qty 2

## 2016-05-24 MED ORDER — DIPHENHYDRAMINE HCL 50 MG/ML IJ SOLN
25.0000 mg | Freq: Once | INTRAMUSCULAR | Status: AC
  Administered 2016-05-24: 25 mg via INTRAVENOUS
  Filled 2016-05-24: qty 1

## 2016-05-24 NOTE — ED Provider Notes (Signed)
CSN: [id number]     Arrival date & time 05/24/16  0946 History   First MD Initiated Contact with Patient 05/24/16 0957     Chief Complaint  Patient presents with  . Headache  . Nausea   (Consider location/radiation/quality/duration/timing/severity/associated sxs/prior Treatment) HPI 65 y.o. male with a hx of HTN, DM, Migraines, presents to the Emergency Department today complaining of frontal headache with onset last night around 6pm. Pt states that he was sitting on the couch and noticed the headache that came all of a sudden. Associated N/V q59min. No abdominal pain. States pain is 8/10 and has been intermittent since last night. Attempted to take Vistaril for his normal migraines, but ineffective. No photophobia. No CP/SOB. No dizziness. No fevers. No numbness/tingling. No other symptoms noted.    Past Medical History  Diagnosis Date  . Hypertension   . Migraines   . Sleep apnea   . Depression   . Arthritis   . Diabetes mellitus type I [facility])    Past Surgical History  Procedure Laterality Date  . Knee arthroscopy    . Rotator cuff repair     No family history on file. Social History  Substance Use Topics  . Smoking status: Light Tobacco Smoker    Types: Cigars  . Smokeless tobacco: Never Used  . Alcohol Use: No    Review of Systems ROS reviewed and all are negative for acute change except as noted in the HPI.  Allergies  Flomax and Lopressor  Home Medications   Prior to Admission medications   Medication Sig Start Date End Date Taking? Authorizing Provider  acetaminophen (TYLENOL) 500 MG tablet Take 2 tablets (1,000 mg total) by mouth every 6 (six) hours as needed. 04/23/16   Arby Barrette, MD  citalopram (CELEXA) 40 MG tablet Take 1 tablet (40 mg total) by mouth daily. 01/08/16   Cleotis Nipper, MD  hydrOXYzine (ATARAX/VISTARIL) 50 MG tablet Take 1 tablet (50 mg total) by mouth at bedtime as needed for anxiety. 01/08/16   Cleotis Nipper, MD  losartan-hydrochlorothiazide  (HYZAAR) 100-25 MG per tablet Take 1 tablet by mouth daily.    Historical Provider, MD  metFORMIN (GLUCOPHAGE) 500 MG tablet Take 500 mg by mouth 3 (three) times daily. 11/21/15   Historical Provider, MD  naproxen (NAPROSYN) 500 MG tablet Take 1 tablet (500 mg total) by mouth 3 (three) times daily as needed. 10/26/12   Charm Rings, NP  nortriptyline (PAMELOR) 25 MG capsule Take 1 capsule (25 mg total) by mouth at bedtime. 01/08/16   Cleotis Nipper, MD  verapamil (CALAN-SR) 120 MG CR tablet  11/11/14   Historical Provider, MD   BP 169/78 mmHg  Pulse 70  Temp(Src) 98.7 F (37.1 C) (Oral)  Resp 20  Ht 5\' 5"  (1.651 m)  Wt 97.977 kg  BMI 35.94 kg/m2  SpO2 98%   Physical Exam  Constitutional: He is oriented to person, place, and time. He appears well-developed and well-nourished.  HENT:  Head: Normocephalic and atraumatic.  Eyes: EOM are normal. Pupils are equal, round, and reactive to light.  Neck: Normal range of motion. Neck supple. No tracheal deviation present.  Cardiovascular: Normal rate, regular rhythm, normal heart sounds and intact distal pulses.   No murmur heard. Pulmonary/Chest: Effort normal and breath sounds normal. No respiratory distress. He has no wheezes. He has no rales. He exhibits no tenderness.  Abdominal: Soft. Normal appearance and bowel sounds are normal. There is no tenderness. There is no rigidity,  no rebound, no guarding, no tenderness at McBurney's point and negative Murphy's sign.  Musculoskeletal: Normal range of motion.  Neurological: He is alert and oriented to person, place, and time. He has normal strength. No cranial nerve deficit or sensory deficit.  Neg Pronator Drift. Finger to Nose intact.  Cranial Nerves:  II: Pupils equal, round, reactive to light III,IV, VI: ptosis not present, extra-ocular motions intact bilaterally  V,VII: smile symmetric, facial light touch sensation equal VIII: hearing grossly normal bilaterally  IX,X: midline uvula rise  XI:  bilateral shoulder shrug equal and strong XII: midline tongue extension  Skin: Skin is warm and dry.  Psychiatric: He has a normal mood and affect. His behavior is normal. Thought content normal.  Nursing note and vitals reviewed.  ED Course  Procedures (including critical care time) Labs Review Labs Reviewed  CBC - Abnormal; Notable for the following:    WBC 11.7 (*)    All other components within normal limits  COMPREHENSIVE METABOLIC PANEL - Abnormal; Notable for the following:    Potassium 3.4 (*)    Glucose, Bld 169 (*)    Alkaline Phosphatase 34 (*)    All other components within normal limits   Imaging Review Ct Head Wo Contrast  05/24/2016  CLINICAL DATA:  Severe headache last night.  History of migraines. EXAM: CT HEAD WITHOUT CONTRAST TECHNIQUE: Contiguous axial images were obtained from the base of the skull through the vertex without intravenous contrast. COMPARISON:  MR brain 12/05/2015 FINDINGS: There is no evidence of mass effect, midline shift or extra-axial fluid collections. There is no evidence of a space-occupying lesion or intracranial hemorrhage. There is no evidence of a cortical-based area of acute infarction. The ventricles and sulci are appropriate for the patient's age. The basal cisterns are patent. Visualized portions of the orbits are unremarkable. The mastoid sinuses are clear. There is mild bilateral maxillary sinus mucosal thickening. There is mild right ethmoid sinus mucosal thickening and right frontal sinus mucosal thickening. The osseous structures are unremarkable. IMPRESSION: No acute intracranial pathology. Electronically Signed   By: Elige Ko   On: 05/24/2016 11:25   I have personally reviewed and evaluated these images and lab results as part of my medical decision-making.   EKG Interpretation None      MDM  I have reviewed and evaluated the relevant laboratory values I have reviewed and evaluated the relevant imaging studies.  I have  reviewed the relevant previous healthcare records. I obtained HPI from historian. Patient discussed with supervising physician  ED Course:  Assessment: Patient is a 64yM that presents with headache since 6pm last night. Associated N/V due to migraines. No ABD pain. Patient has a normal complete neurological exam, normal vital signs, normal level of consciousness, no signs of meningismus, is well-appearing/non-toxic appearing, no signs of trauma. No papilledema, no pain over the temporal arteries. Imaging with CT unremarkable for acute abnormalties. CBC/CMP unremarkabe. Given NS bolus, benadryl, decadron, reglan with relief of symptoms. No dangerous or life-threatening conditions suspected or identified by history, physical exam, and by work-up. No indications for hospitalization identified.   Disposition/Plan:  DC Home Additional Verbal discharge instructions given and discussed with patient.  Pt Instructed to f/u with PCP in the next week for evaluation and treatment of symptoms. Return precautions given Pt acknowledges and agrees with plan  Supervising Physician Lyndal Pulley, MD   Final diagnoses:  Nonintractable headache, unspecified chronicity pattern, unspecified headache type     Audry Pili, PA-C 05/24/16 1145  Lyndal Pulleyaniel Knott, MD 05/24/16 [phone]

## 2016-05-24 NOTE — Discharge Instructions (Signed)
Please read and follow all provided instructions.  Your diagnoses today include:  1. Nonintractable headache, unspecified chronicity pattern, unspecified headache type    Tests performed today include:  CT of your head which was normal and did not show any serious cause of your headache  Vital signs. See below for your results today.   Medications:  In the Emergency Department you received:  Reglan - antinausea/headache medication  Benadryl - antihistamine to counteract potential side effects of reglan  Toradol - NSAID medication similar to ibuprofen  Take any prescribed medications only as directed.  Additional information:  Follow any educational materials contained in this packet.  You are having a headache. No specific cause was found today for your headache. It may have been a migraine or other cause of headache. Stress, anxiety, fatigue, and depression are common triggers for headaches.   Your headache today does not appear to be life-threatening or require hospitalization, but often the exact cause of headaches is not determined in the emergency department. Therefore, follow-up with your doctor is very important to find out what may have caused your headache and whether or not you need any further diagnostic testing or treatment.   Sometimes headaches can appear benign (not harmful), but then more serious symptoms can develop which should prompt an immediate re-evaluation by your doctor or the emergency department.  BE VERY CAREFUL not to take multiple medicines containing Tylenol (also called acetaminophen). Doing so can lead to an overdose which can damage your liver and cause liver failure and possibly death.   Follow-up instructions: Please follow-up with your primary care provider in the next 3 days for further evaluation of your symptoms.   Return instructions:   Please return to the Emergency Department if you experience worsening symptoms.  Return if the  medications do not resolve your headache, if it recurs, or if you have multiple episodes of vomiting or cannot keep down fluids.  Return if you have a change from the usual headache.  RETURN IMMEDIATELY IF you:  Develop a sudden, severe headache  Develop confusion or become poorly responsive or faint  Develop a fever above 100.61F or problem breathing  Have a change in speech, vision, swallowing, or understanding  Develop new weakness, numbness, tingling, incoordination in your arms or legs  Have a seizure  Please return if you have any other emergent concerns.  Additional Information:  Your vital signs today were: BP 169/78 mmHg   Pulse 70   Temp(Src) 98.7 F (37.1 C) (Oral)   Resp 20   Ht 5\' 5"  (1.651 m)   Wt 97.977 kg   BMI 35.94 kg/m2   SpO2 98% If your blood pressure (BP) was elevated above 135/85 this visit, please have this repeated by your doctor within one month. --------------

## 2016-05-24 NOTE — ED Notes (Signed)
Patient presents from home complaining of headache with nausea and vomiting since yesterday evening. Hx of migraines but states "this feels different". Unrelieved by normal measures used for migraines.

## 2016-05-24 NOTE — ED Notes (Signed)
PA at bedside.

## 2016-05-25 ENCOUNTER — Emergency Department (HOSPITAL_BASED_OUTPATIENT_CLINIC_OR_DEPARTMENT_OTHER)
Admission: EM | Admit: 2016-05-25 | Discharge: 2016-05-26 | Disposition: A | Discharge status: Home / Self Care | Payer: Medicare Other | Attending: Emergency Medicine | Admitting: Emergency Medicine

## 2016-05-25 ENCOUNTER — Encounter (HOSPITAL_BASED_OUTPATIENT_CLINIC_OR_DEPARTMENT_OTHER): Payer: Self-pay | Admitting: Emergency Medicine

## 2016-05-25 DIAGNOSIS — E109 Type 1 diabetes mellitus without complications: Secondary | ICD-10-CM | POA: Diagnosis not present

## 2016-05-25 DIAGNOSIS — H53149 Visual discomfort, unspecified: Secondary | ICD-10-CM | POA: Insufficient documentation

## 2016-05-25 DIAGNOSIS — R112 Nausea with vomiting, unspecified: Secondary | ICD-10-CM | POA: Insufficient documentation

## 2016-05-25 DIAGNOSIS — F17210 Nicotine dependence, cigarettes, uncomplicated: Secondary | ICD-10-CM | POA: Insufficient documentation

## 2016-05-25 DIAGNOSIS — I10 Essential (primary) hypertension: Secondary | ICD-10-CM | POA: Insufficient documentation

## 2016-05-25 DIAGNOSIS — R519 Headache, unspecified: Secondary | ICD-10-CM

## 2016-05-25 DIAGNOSIS — R51 Headache: Secondary | ICD-10-CM | POA: Insufficient documentation

## 2016-05-25 DIAGNOSIS — F329 Major depressive disorder, single episode, unspecified: Secondary | ICD-10-CM | POA: Diagnosis not present

## 2016-05-25 DIAGNOSIS — R110 Nausea: Secondary | ICD-10-CM

## 2016-05-25 DIAGNOSIS — Z7984 Long term (current) use of oral hypoglycemic drugs: Secondary | ICD-10-CM | POA: Diagnosis not present

## 2016-05-25 LAB — CBG MONITORING, ED: GLUCOSE-CAPILLARY: 134 mg/dL — AB (ref 65–99)

## 2016-05-25 MED ORDER — METOCLOPRAMIDE HCL 5 MG/ML IJ SOLN
10.0000 mg | Freq: Once | INTRAMUSCULAR | Status: AC
  Administered 2016-05-25: 10 mg via INTRAVENOUS
  Filled 2016-05-25: qty 2

## 2016-05-25 MED ORDER — ONDANSETRON HCL 4 MG/2ML IJ SOLN
4.0000 mg | Freq: Once | INTRAMUSCULAR | Status: AC
  Administered 2016-05-25: 4 mg via INTRAVENOUS
  Filled 2016-05-25: qty 2

## 2016-05-25 MED ORDER — KETOROLAC TROMETHAMINE 30 MG/ML IJ SOLN
30.0000 mg | Freq: Once | INTRAMUSCULAR | Status: AC
  Administered 2016-05-25: 30 mg via INTRAVENOUS
  Filled 2016-05-25: qty 1

## 2016-05-25 MED ORDER — DIPHENHYDRAMINE HCL 50 MG/ML IJ SOLN
25.0000 mg | Freq: Once | INTRAMUSCULAR | Status: AC
Start: 2016-05-25 — End: 2016-05-25
  Administered 2016-05-25: 25 mg via INTRAVENOUS
  Filled 2016-05-25: qty 1

## 2016-05-25 MED ORDER — PROMETHAZINE HCL 25 MG/ML IJ SOLN
12.5000 mg | Freq: Once | INTRAMUSCULAR | Status: AC
  Administered 2016-05-25: 12.5 mg via INTRAVENOUS
  Filled 2016-05-25: qty 1

## 2016-05-25 MED ORDER — DEXAMETHASONE SODIUM PHOSPHATE 10 MG/ML IJ SOLN
10.0000 mg | Freq: Once | INTRAMUSCULAR | Status: AC
  Administered 2016-05-25: 10 mg via INTRAVENOUS
  Filled 2016-05-25: qty 1

## 2016-05-25 MED ORDER — SODIUM CHLORIDE 0.9 % IV BOLUS (SEPSIS)
1000.0000 mL | Freq: Once | INTRAVENOUS | Status: AC
  Administered 2016-05-25: 1000 mL via INTRAVENOUS

## 2016-05-25 NOTE — ED Provider Notes (Signed)
CSN: [id number]     Arrival date & time 05/25/16  2204 History  By signing my name below, I, Terrance Branch, attest that this documentation has been prepared under the direction and in the presence of Geoffery Lyons, MD. Electronically Signed: Evon Slack, ED Scribe. 05/25/2016. 11:15 PM.      Chief Complaint  Patient presents with  . Headache   Patient is a 65 y.o. male presenting with headaches. The history is provided by the patient. No language interpreter was used.  Headache Associated symptoms: abdominal pain, nausea, photophobia and vomiting   Associated symptoms: no diarrhea and no fever    HPI Comments: Raymond [name] is a 65 y.o. male with PMHx of migraines who presents to the Emergency Department complaining of frontal HA onset 2 days prior.  Pt states that he has associated photophobia, nausea, abdominal pain and vomiting. Pt reports that he was recently evaluated here in the ED for similar HA. He states that he had temporary relief with the migraine cocktail. denies diarrhea or other related symptoms. Pt reports Hx of migraines but this does not feel like typical migraine.    Past Medical History  Diagnosis Date  . Hypertension   . Migraines   . Sleep apnea   . Depression   . Arthritis   . Diabetes mellitus type I [facility])    Past Surgical History  Procedure Laterality Date  . Knee arthroscopy    . Rotator cuff repair     History reviewed. No pertinent family history. Social History  Substance Use Topics  . Smoking status: Light Tobacco Smoker    Types: Cigars  . Smokeless tobacco: Never Used  . Alcohol Use: No    Review of Systems  Constitutional: Positive for chills. Negative for fever.  Eyes: Positive for photophobia.  Gastrointestinal: Positive for nausea, vomiting and abdominal pain. Negative for diarrhea.  Neurological: Positive for headaches.  All other systems reviewed and are negative.     Allergies  Flomax and Lopressor  Home Medications    Prior to Admission medications   Medication Sig Start Date End Date Taking? Authorizing Provider  acetaminophen (TYLENOL) 500 MG tablet Take 2 tablets (1,000 mg total) by mouth every 6 (six) hours as needed. 04/23/16   Arby Barrette, MD  citalopram (CELEXA) 40 MG tablet Take 1 tablet (40 mg total) by mouth daily. 01/08/16   Cleotis Nipper, MD  hydrOXYzine (ATARAX/VISTARIL) 50 MG tablet Take 1 tablet (50 mg total) by mouth at bedtime as needed for anxiety. 01/08/16   Cleotis Nipper, MD  losartan-hydrochlorothiazide (HYZAAR) 100-25 MG per tablet Take 1 tablet by mouth daily.    Historical Provider, MD  metFORMIN (GLUCOPHAGE) 500 MG tablet Take 500 mg by mouth 3 (three) times daily. 11/21/15   Historical Provider, MD  naproxen (NAPROSYN) 500 MG tablet Take 1 tablet (500 mg total) by mouth 3 (three) times daily as needed. 10/26/12   Charm Rings, NP  nortriptyline (PAMELOR) 25 MG capsule Take 1 capsule (25 mg total) by mouth at bedtime. 01/08/16   Cleotis Nipper, MD  verapamil (CALAN-SR) 120 MG CR tablet  11/11/14   Historical Provider, MD   BP 180/90 mmHg  Pulse 66  Temp(Src) 98.1 F (36.7 C) (Oral)  Resp 20  Ht  (1.651 m)  Wt 213 lb (96.616 kg)  BMI 35.44 kg/m2  SpO2 100%   Physical Exam  Constitutional: He is oriented to person, place, and time. He appears well-developed and well-nourished.  No distress.  HENT:  Head: Normocephalic and atraumatic.  Mouth/Throat: Oropharynx is clear and moist.  Eyes: Conjunctivae and EOM are normal. Pupils are equal, round, and reactive to light.  Neck: Neck supple. No tracheal deviation present.  Cardiovascular: Normal rate, regular rhythm and normal heart sounds.   No murmur heard. Pulmonary/Chest: Effort normal and breath sounds normal. No respiratory distress. He has no wheezes. He has no rales.  Abdominal: Soft. There is no tenderness.  Musculoskeletal: Normal range of motion.  Neurological: He is alert and oriented to person, place, and time. He  has normal strength. No cranial nerve deficit. He exhibits normal muscle tone. Coordination normal.  Skin: Skin is warm and dry.  Psychiatric: He has a normal mood and affect. His behavior is normal.  Nursing note and vitals reviewed.   ED Course  Procedures (including critical care time) DIAGNOSTIC STUDIES: Oxygen Saturation is 100% on RA, normal by my interpretation.    COORDINATION OF CARE: 11:16 PM-Discussed treatment plan which includes migraine cocktail  with pt at bedside and pt agreed to plan.     Labs Review Labs Reviewed  CBG MONITORING, ED - Abnormal; Notable for the following:    Glucose-Capillary 134 (*)    All other components within normal limits    Imaging Review Ct Head Wo Contrast  05/24/2016  CLINICAL DATA:  Severe headache last night.  History of migraines. EXAM: CT HEAD WITHOUT CONTRAST TECHNIQUE: Contiguous axial images were obtained from the base of the skull through the vertex without intravenous contrast. COMPARISON:  MR brain 12/05/2015 FINDINGS: There is no evidence of mass effect, midline shift or extra-axial fluid collections. There is no evidence of a space-occupying lesion or intracranial hemorrhage. There is no evidence of a cortical-based area of acute infarction. The ventricles and sulci are appropriate for the patient's age. The basal cisterns are patent. Visualized portions of the orbits are unremarkable. The mastoid sinuses are clear. There is mild bilateral maxillary sinus mucosal thickening. There is mild right ethmoid sinus mucosal thickening and right frontal sinus mucosal thickening. The osseous structures are unremarkable. IMPRESSION: No acute intracranial pathology. Electronically Signed   By: Elige Ko   On: 05/24/2016 11:25   I have personally reviewed and evaluated these images and lab results as part of my medical decision-making.   EKG Interpretation None      MDM   Final diagnoses:  None   Laboratory studies are unremarkable  and the patient is feeling better after receiving fluids and medications. He had a head CT yesterday and I see no reason to repeat this. Will be discharged with Zofran and continued home medications. I see no red flags that would suggest an LP is indicated. He is to return as needed for any problems.   I personally performed the services described in this documentation, which was scribed in my presence. The recorded information has been reviewed and is accurate.         Geoffery Lyons, MD 05/26/16 [phone]

## 2016-05-25 NOTE — ED Notes (Addendum)
Patient states that he was here yesterday with these very similar problems. The patient reports that he continues to have a HA and N/V - reports that this feeling is different from his typical MHA's . Patient is having frequent episodes of Nausea with dry heaves in triage and tremors like chills. Denies any fevers at home

## 2016-05-26 DIAGNOSIS — R51 Headache: Secondary | ICD-10-CM | POA: Diagnosis not present

## 2016-05-26 LAB — COMPREHENSIVE METABOLIC PANEL
ALK PHOS: 30 U/L — AB (ref 38–126)
ALT: 39 U/L (ref 17–63)
ANION GAP: 7 (ref 5–15)
AST: 34 U/L (ref 15–41)
Albumin: 4 g/dL (ref 3.5–5.0)
BILIRUBIN TOTAL: 0.7 mg/dL (ref 0.3–1.2)
BUN: 23 mg/dL — ABNORMAL HIGH (ref 6–20)
CALCIUM: 8.5 mg/dL — AB (ref 8.9–10.3)
CO2: 27 mmol/L (ref 22–32)
Chloride: 105 mmol/L (ref 101–111)
Creatinine, Ser: 1.08 mg/dL (ref 0.61–1.24)
GFR calc non Af Amer: >60 mL/min (ref >60)
Glucose, Bld: 132 mg/dL — ABNORMAL HIGH (ref 65–99)
POTASSIUM: 3.3 mmol/L — AB (ref 3.5–5.1)
Sodium: 139 mmol/L (ref 135–145)
TOTAL PROTEIN: 6.5 g/dL (ref 6.5–8.1)

## 2016-05-26 LAB — CBC WITH DIFFERENTIAL/PLATELET
BASOS PCT: 0 %
Basophils Absolute: 0 10*3/uL (ref 0.0–0.1)
EOS ABS: 0 10*3/uL (ref 0.0–0.7)
EOS PCT: 0 %
HCT: 46 % (ref 39.0–52.0)
HEMOGLOBIN: 16.2 g/dL (ref 13.0–17.0)
LYMPHS ABS: 1.1 10*3/uL (ref 0.7–4.0)
Lymphocytes Relative: 11 %
MCH: 30.2 pg (ref 26.0–34.0)
MCHC: 35.2 g/dL (ref 30.0–36.0)
MCV: 85.7 fL (ref 78.0–100.0)
Monocytes Absolute: 0.9 10*3/uL (ref 0.1–1.0)
Monocytes Relative: 9 %
NEUTROS PCT: 80 %
Neutro Abs: 8.4 10*3/uL — ABNORMAL HIGH (ref 1.7–7.7)
Platelets: 181 10*3/uL (ref 150–400)
RBC: 5.37 MIL/uL (ref 4.22–5.81)
RDW: 12.7 % (ref 11.5–15.5)
WBC: 10.4 10*3/uL (ref 4.0–10.5)

## 2016-05-26 MED ORDER — HYDROMORPHONE HCL 1 MG/ML IJ SOLN
INTRAMUSCULAR | Status: AC
  Filled 2016-05-26: qty 1

## 2016-05-26 NOTE — ED Notes (Signed)
Pt was discharged at 0230. Pt had pain score of 2/10 at discharge. Please see paper charting due to epic downtime.

## 2017-06-14 ENCOUNTER — Emergency Department (HOSPITAL_BASED_OUTPATIENT_CLINIC_OR_DEPARTMENT_OTHER): Payer: Medicare Other

## 2017-06-14 ENCOUNTER — Emergency Department (HOSPITAL_BASED_OUTPATIENT_CLINIC_OR_DEPARTMENT_OTHER)
Admission: EM | Admit: 2017-06-14 | Discharge: 2017-06-14 | Disposition: A | Discharge status: Home / Self Care | Payer: Medicare Other | Attending: Physician Assistant | Admitting: Physician Assistant

## 2017-06-14 ENCOUNTER — Encounter (HOSPITAL_BASED_OUTPATIENT_CLINIC_OR_DEPARTMENT_OTHER): Payer: Self-pay | Admitting: Emergency Medicine

## 2017-06-14 DIAGNOSIS — R079 Chest pain, unspecified: Secondary | ICD-10-CM | POA: Diagnosis not present

## 2017-06-14 DIAGNOSIS — R05 Cough: Secondary | ICD-10-CM | POA: Diagnosis present

## 2017-06-14 DIAGNOSIS — Z79899 Other long term (current) drug therapy: Secondary | ICD-10-CM | POA: Diagnosis not present

## 2017-06-14 DIAGNOSIS — E119 Type 2 diabetes mellitus without complications: Secondary | ICD-10-CM | POA: Insufficient documentation

## 2017-06-14 DIAGNOSIS — F17210 Nicotine dependence, cigarettes, uncomplicated: Secondary | ICD-10-CM | POA: Insufficient documentation

## 2017-06-14 DIAGNOSIS — Z7984 Long term (current) use of oral hypoglycemic drugs: Secondary | ICD-10-CM | POA: Diagnosis not present

## 2017-06-14 DIAGNOSIS — I10 Essential (primary) hypertension: Secondary | ICD-10-CM | POA: Diagnosis not present

## 2017-06-14 DIAGNOSIS — J189 Pneumonia, unspecified organism: Secondary | ICD-10-CM | POA: Diagnosis not present

## 2017-06-14 MED ORDER — LEVOFLOXACIN 500 MG PO TABS
500.0000 mg | ORAL_TABLET | Freq: Once | ORAL | Status: AC
  Administered 2017-06-14: 500 mg via ORAL
  Filled 2017-06-14: qty 1

## 2017-06-14 MED ORDER — CLINDAMYCIN HCL 300 MG PO CAPS: 300.0000 mg | ORAL_CAPSULE | Freq: Three times a day (TID) | ORAL | 0 refills | Status: DC

## 2017-06-14 MED ORDER — GUAIFENESIN-CODEINE 100-10 MG/5ML PO SOLN: 5.0000 mL | Freq: Four times a day (QID) | ORAL | 0 refills | Status: DC | PRN

## 2017-06-14 MED ORDER — BENZONATATE 100 MG PO CAPS: 100.0000 mg | ORAL_CAPSULE | Freq: Three times a day (TID) | ORAL | 0 refills | Status: DC | PRN

## 2017-06-14 MED ORDER — AMOXICILLIN-POT CLAVULANATE 875-125 MG PO TABS: 1.0000 | ORAL_TABLET | Freq: Two times a day (BID) | ORAL | 0 refills | Status: DC

## 2017-06-14 NOTE — ED Provider Notes (Signed)
[facility] Note   CSN: [id number] Arrival date & time: 06/14/17  2106     History   Chief Complaint Chief Complaint  Patient presents with  . Cough    HPI Raymond [name] is a 66 y.o. male.  HPI  66 year old male presenting with a cough. Patient was discharged from the hospital after 24-hour hospital stay for gallbladder removal. This was 3 days ago. Patient reports cough since then and chest pain with cough on the right-hand side. Patient denies any fever. Mild decreased appetite. Otherwise no symptoms.   Past Medical History:  Diagnosis Date  . Arthritis   . Depression   . Diabetes mellitus type I ([facility]CC)   . Hypertension   . Migraines   . Sleep apnea     Patient Active Problem List   Diagnosis Date Noted  . Major depressive disorder, recurrent (HCC) 10/21/2012    Past Surgical History:  Procedure Laterality Date  . CHOLECYSTECTOMY    . KNEE ARTHROSCOPY    . ROTATOR CUFF REPAIR         Home Medications    Prior to Admission medications   Medication Sig Start Date End Date Taking? Authorizing Provider  metoprolol tartrate (LOPRESSOR) 100 MG tablet Take 100 mg by mouth 2 (two) times daily.   Yes [provider]  acetaminophen (TYLENOL) 500 MG tablet Take 2 tablets (1,000 mg total) by mouth every 6 (six) hours as needed. 04/23/16   Arby Barrette, MD  citalopram (CELEXA) 40 MG tablet Take 1 tablet (40 mg total) by mouth daily. 01/08/16   Arfeen, Phillips Grout, MD  hydrOXYzine (ATARAX/VISTARIL) 50 MG tablet Take 1 tablet (50 mg total) by mouth at bedtime as needed for anxiety. 01/08/16   Arfeen, Phillips Grout, MD  losartan-hydrochlorothiazide (HYZAAR) 100-25 MG per tablet Take 1 tablet by mouth daily.    [provider]  metFORMIN (GLUCOPHAGE) 500 MG tablet Take 500 mg by mouth 3 (three) times daily. 11/21/15   [provider]  naproxen (NAPROSYN) 500 MG tablet Take 1 tablet (500 mg total) by mouth 3 (three) times daily as needed.  10/26/12   Charm Rings, NP  nortriptyline (PAMELOR) 25 MG capsule Take 1 capsule (25 mg total) by mouth at bedtime. 01/08/16   Arfeen, Phillips Grout, MD  verapamil (CALAN-SR) 120 MG CR tablet  11/11/14   [provider]    Family History History reviewed. No pertinent family history.  Social History Social History  Substance Use Topics  . Smoking status: Light Tobacco Smoker    Types: Cigars  . Smokeless tobacco: Never Used  . Alcohol use No     Allergies   Flomax [tamsulosin hcl] and Lopressor [metoprolol tartrate]   Review of Systems Review of Systems  Constitutional: Negative for activity change.  Respiratory: Positive for cough and shortness of breath.   Cardiovascular: Negative for chest pain.  Gastrointestinal: Negative for abdominal pain.  Genitourinary: Negative for difficulty urinating.     Physical Exam Updated Vital Signs BP (!) 155/67 (BP Location: Left Arm)   Pulse 64   Temp 99.2 F (37.3 C) (Oral)   Resp 18   Ht 5\' 4"  (1.626 m)   Wt 101.2 kg (223 lb)   SpO2 100%   BMI 38.28 kg/m   Physical Exam  Constitutional: He is oriented to person, place, and time. He appears well-nourished.  HENT:  Head: Normocephalic.  Eyes: Conjunctivae are normal.  Cardiovascular: Normal rate and regular rhythm.  Pulmonary/Chest: Effort normal and breath sounds normal. No respiratory distress. He has no wheezes.  Abdominal: Soft. He exhibits no distension.  Neurological: He is oriented to person, place, and time.  Skin: Skin is warm and dry. He is not diaphoretic.  Psychiatric: He has a normal mood and affect. His behavior is normal.     ED Treatments / Results  Labs (all labs ordered are listed, but only abnormal results are displayed) Labs Reviewed - No data to display  EKG  EKG Interpretation None       Radiology Dg Chest 2 View  Result Date: 06/14/2017 CLINICAL DATA:  Acute onset of fever and cough. Lower abdominal pain. Initial encounter. EXAM:  CHEST  2 VIEW COMPARISON:  Chest radiograph performed 11/17/2010 FINDINGS: The lungs are well-aerated. Mild right basilar airspace opacity raises concern for pneumonia. There is no evidence of pleural effusion or pneumothorax. The heart is mildly enlarged. No acute osseous abnormalities are seen. Clips are noted within the right upper quadrant, reflecting prior cholecystectomy. IMPRESSION: Mild right basilar airspace opacity raises concern for pneumonia. Mild cardiomegaly. Electronically Signed   By: Roanna RaiderJeffery  Chang M.D.   On: 06/14/2017 22:03    Procedures Procedures (including critical care time)  Medications Ordered in ED Medications - No data to display   Initial Impression / Assessment and Plan / ED Course  I have reviewed the triage vital signs and the nursing notes.  Pertinent labs & imaging results that were available during my care of the patient were reviewed by me and considered in my medical decision making (see chart for details).    66 year old male presenting with a cough. Patient was discharged from the hospital after 24-hour hospital stay for gallbladder removal. This was 3 days ago. Patient reports cough since then and chest pain with cough on the right-hand side. Patient denies any fever. Mild decreased appetite. Otherwise no symomps.  10:51 PM X-ray shows pneumonia. We'll treat for hospital-acquired pneumonia. We'll treat with levofloxacin. We'll give him cough relief for both daytime and nighttime use.  Final Clinical Impressions(s) / ED Diagnoses   Final diagnoses:  None    New Prescriptions New Prescriptions   No medications on file     Abelino DerrickMackuen, Irineo Gaulin Lyn, MD 06/14/17 2252

## 2017-06-14 NOTE — ED Triage Notes (Signed)
Patient reports that he had gallbladder surgery on Tues. Was sent home on wed and since he has had a cough and pain to his right side

## 2017-06-14 NOTE — Discharge Instructions (Signed)
Please return if not improving or with any increasing fever, or difficuty breathing other concerns.

## 2017-06-21 ENCOUNTER — Inpatient Hospital Stay (HOSPITAL_BASED_OUTPATIENT_CLINIC_OR_DEPARTMENT_OTHER)
Admission: EM | Admit: 2017-06-21 | Discharge: 2017-06-24 | DRG: 040 | Disposition: A | Discharge status: Home / Self Care | Payer: Medicare Other | Attending: Internal Medicine | Admitting: Internal Medicine

## 2017-06-21 ENCOUNTER — Emergency Department (HOSPITAL_BASED_OUTPATIENT_CLINIC_OR_DEPARTMENT_OTHER): Payer: Medicare Other

## 2017-06-21 ENCOUNTER — Other Ambulatory Visit: Payer: Self-pay

## 2017-06-21 ENCOUNTER — Encounter (HOSPITAL_BASED_OUTPATIENT_CLINIC_OR_DEPARTMENT_OTHER): Payer: Self-pay | Admitting: Emergency Medicine

## 2017-06-21 ENCOUNTER — Other Ambulatory Visit (HOSPITAL_COMMUNITY): Payer: Self-pay

## 2017-06-21 DIAGNOSIS — R4701 Aphasia: Secondary | ICD-10-CM | POA: Diagnosis present

## 2017-06-21 DIAGNOSIS — R4789 Other speech disturbances: Secondary | ICD-10-CM

## 2017-06-21 DIAGNOSIS — I5032 Chronic diastolic (congestive) heart failure: Secondary | ICD-10-CM | POA: Diagnosis not present

## 2017-06-21 DIAGNOSIS — I672 Cerebral atherosclerosis: Secondary | ICD-10-CM | POA: Diagnosis present

## 2017-06-21 DIAGNOSIS — Z79899 Other long term (current) drug therapy: Secondary | ICD-10-CM

## 2017-06-21 DIAGNOSIS — I340 Nonrheumatic mitral (valve) insufficiency: Secondary | ICD-10-CM | POA: Diagnosis not present

## 2017-06-21 DIAGNOSIS — I639 Cerebral infarction, unspecified: Secondary | ICD-10-CM | POA: Diagnosis not present

## 2017-06-21 DIAGNOSIS — H6691 Otitis media, unspecified, right ear: Secondary | ICD-10-CM | POA: Diagnosis present

## 2017-06-21 DIAGNOSIS — E119 Type 2 diabetes mellitus without complications: Secondary | ICD-10-CM | POA: Diagnosis present

## 2017-06-21 DIAGNOSIS — E1169 Type 2 diabetes mellitus with other specified complication: Secondary | ICD-10-CM | POA: Diagnosis present

## 2017-06-21 DIAGNOSIS — Z888 Allergy status to other drugs, medicaments and biological substances status: Secondary | ICD-10-CM

## 2017-06-21 DIAGNOSIS — R410 Disorientation, unspecified: Secondary | ICD-10-CM | POA: Diagnosis not present

## 2017-06-21 DIAGNOSIS — G4733 Obstructive sleep apnea (adult) (pediatric): Secondary | ICD-10-CM | POA: Diagnosis present

## 2017-06-21 DIAGNOSIS — F329 Major depressive disorder, single episode, unspecified: Secondary | ICD-10-CM | POA: Diagnosis present

## 2017-06-21 DIAGNOSIS — Z9049 Acquired absence of other specified parts of digestive tract: Secondary | ICD-10-CM

## 2017-06-21 DIAGNOSIS — H832X9 Labyrinthine dysfunction, unspecified ear: Secondary | ICD-10-CM | POA: Diagnosis present

## 2017-06-21 DIAGNOSIS — Z6838 Body mass index (BMI) 38.0-38.9, adult: Secondary | ICD-10-CM

## 2017-06-21 DIAGNOSIS — I10 Essential (primary) hypertension: Secondary | ICD-10-CM | POA: Diagnosis present

## 2017-06-21 DIAGNOSIS — Z9989 Dependence on other enabling machines and devices: Secondary | ICD-10-CM | POA: Diagnosis not present

## 2017-06-21 DIAGNOSIS — I63412 Cerebral infarction due to embolism of left middle cerebral artery: Principal | ICD-10-CM | POA: Diagnosis present

## 2017-06-21 DIAGNOSIS — R4182 Altered mental status, unspecified: Secondary | ICD-10-CM | POA: Diagnosis present

## 2017-06-21 DIAGNOSIS — I6300 Cerebral infarction due to thrombosis of unspecified precerebral artery: Secondary | ICD-10-CM

## 2017-06-21 DIAGNOSIS — G43909 Migraine, unspecified, not intractable, without status migrainosus: Secondary | ICD-10-CM | POA: Diagnosis present

## 2017-06-21 DIAGNOSIS — F32A Depression, unspecified: Secondary | ICD-10-CM | POA: Diagnosis present

## 2017-06-21 DIAGNOSIS — E669 Obesity, unspecified: Secondary | ICD-10-CM | POA: Diagnosis present

## 2017-06-21 DIAGNOSIS — R001 Bradycardia, unspecified: Secondary | ICD-10-CM | POA: Diagnosis present

## 2017-06-21 DIAGNOSIS — H9193 Unspecified hearing loss, bilateral: Secondary | ICD-10-CM | POA: Diagnosis present

## 2017-06-21 DIAGNOSIS — H8390 Unspecified disease of inner ear, unspecified ear: Secondary | ICD-10-CM | POA: Diagnosis not present

## 2017-06-21 DIAGNOSIS — J189 Pneumonia, unspecified organism: Secondary | ICD-10-CM | POA: Diagnosis present

## 2017-06-21 DIAGNOSIS — I638 Other cerebral infarction: Secondary | ICD-10-CM | POA: Diagnosis not present

## 2017-06-21 DIAGNOSIS — F17290 Nicotine dependence, other tobacco product, uncomplicated: Secondary | ICD-10-CM | POA: Diagnosis present

## 2017-06-21 DIAGNOSIS — Z7984 Long term (current) use of oral hypoglycemic drugs: Secondary | ICD-10-CM | POA: Diagnosis not present

## 2017-06-21 DIAGNOSIS — I63512 Cerebral infarction due to unspecified occlusion or stenosis of left middle cerebral artery: Secondary | ICD-10-CM | POA: Diagnosis not present

## 2017-06-21 HISTORY — DX: Type 2 diabetes mellitus with other specified complication: E11.69

## 2017-06-21 HISTORY — DX: Obesity, unspecified: E66.9

## 2017-06-21 LAB — COMPREHENSIVE METABOLIC PANEL
ALBUMIN: 3.4 g/dL — AB (ref 3.5–5.0)
ALT: 28 U/L (ref 17–63)
ANION GAP: 9 (ref 5–15)
AST: 25 U/L (ref 15–41)
Alkaline Phosphatase: 33 U/L — ABNORMAL LOW (ref 38–126)
BILIRUBIN TOTAL: 0.5 mg/dL (ref 0.3–1.2)
BUN: 15 mg/dL (ref 6–20)
CHLORIDE: 101 mmol/L (ref 101–111)
CO2: 27 mmol/L (ref 22–32)
Calcium: 8.7 mg/dL — ABNORMAL LOW (ref 8.9–10.3)
Creatinine, Ser: 1.13 mg/dL (ref 0.61–1.24)
GFR calc Af Amer: >60 mL/min (ref >60)
Glucose, Bld: 222 mg/dL — ABNORMAL HIGH (ref 65–99)
POTASSIUM: 3.9 mmol/L (ref 3.5–5.1)
Sodium: 137 mmol/L (ref 135–145)
Total Protein: 6.4 g/dL — ABNORMAL LOW (ref 6.5–8.1)

## 2017-06-21 LAB — CBC WITH DIFFERENTIAL/PLATELET
Basophils Absolute: 0 10*3/uL (ref 0.0–0.1)
Basophils Relative: 1 %
Eosinophils Absolute: 0.2 10*3/uL (ref 0.0–0.7)
Eosinophils Relative: 3 %
HEMATOCRIT: 39.7 % (ref 39.0–52.0)
HEMOGLOBIN: 13.8 g/dL (ref 13.0–17.0)
LYMPHS PCT: 23 %
Lymphs Abs: 1.5 10*3/uL (ref 0.7–4.0)
MCH: 29.7 pg (ref 26.0–34.0)
MCHC: 34.8 g/dL (ref 30.0–36.0)
MCV: 85.6 fL (ref 78.0–100.0)
MONO ABS: 0.5 10*3/uL (ref 0.1–1.0)
Monocytes Relative: 7 %
NEUTROS ABS: 4.3 10*3/uL (ref 1.7–7.7)
NEUTROS PCT: 66 %
PLATELETS: 280 10*3/uL (ref 150–400)
RBC: 4.64 MIL/uL (ref 4.22–5.81)
RDW: 12.5 % (ref 11.5–15.5)
WBC: 6.4 10*3/uL (ref 4.0–10.5)

## 2017-06-21 LAB — GLUCOSE, CAPILLARY: Glucose-Capillary: 160 mg/dL — ABNORMAL HIGH (ref 65–99)

## 2017-06-21 LAB — URINALYSIS, ROUTINE W REFLEX MICROSCOPIC
BILIRUBIN URINE: NEGATIVE
GLUCOSE, UA: 250 mg/dL — AB
HGB URINE DIPSTICK: NEGATIVE
KETONES UR: NEGATIVE mg/dL
Leukocytes, UA: NEGATIVE
Nitrite: NEGATIVE
PROTEIN: NEGATIVE mg/dL
Specific Gravity, Urine: 1.023 (ref 1.005–1.030)
pH: 6.5 (ref 5.0–8.0)

## 2017-06-21 LAB — TROPONIN I

## 2017-06-21 LAB — LIPASE, BLOOD: LIPASE: 42 U/L (ref 11–51)

## 2017-06-21 MED ORDER — INSULIN ASPART 100 UNIT/ML ~~LOC~~ SOLN
0.0000 [IU] | Freq: Three times a day (TID) | SUBCUTANEOUS | Status: DC
  Administered 2017-06-22: 1 [IU] via SUBCUTANEOUS
  Administered 2017-06-23: 3 [IU] via SUBCUTANEOUS
  Administered 2017-06-23 (×2): 1 [IU] via SUBCUTANEOUS

## 2017-06-21 MED ORDER — ENOXAPARIN SODIUM 40 MG/0.4ML ~~LOC~~ SOLN
40.0000 mg | SUBCUTANEOUS | Status: DC
  Administered 2017-06-21 – 2017-06-23 (×3): 40 mg via SUBCUTANEOUS
  Filled 2017-06-21 (×3): qty 0.4

## 2017-06-21 MED ORDER — ASPIRIN 81 MG PO CHEW
324.0000 mg | CHEWABLE_TABLET | Freq: Every day | ORAL | Status: DC
  Administered 2017-06-22 – 2017-06-23 (×2): 324 mg via ORAL
  Filled 2017-06-21 (×2): qty 4

## 2017-06-21 MED ORDER — ACETAMINOPHEN 325 MG PO TABS
650.0000 mg | ORAL_TABLET | ORAL | Status: DC | PRN
  Administered 2017-06-22: 650 mg via ORAL
  Filled 2017-06-21: qty 2

## 2017-06-21 MED ORDER — METOPROLOL TARTRATE 100 MG PO TABS: 100.0000 mg | ORAL_TABLET | Freq: Two times a day (BID) | ORAL | Status: DC

## 2017-06-21 MED ORDER — LOSARTAN POTASSIUM 50 MG PO TABS
100.0000 mg | ORAL_TABLET | Freq: Every day | ORAL | Status: DC
  Administered 2017-06-22: 100 mg via ORAL
  Filled 2017-06-21: qty 2

## 2017-06-21 MED ORDER — ACETAMINOPHEN 160 MG/5ML PO SOLN: 650.0000 mg | ORAL | Status: DC | PRN

## 2017-06-21 MED ORDER — SODIUM CHLORIDE 0.9 % IV BOLUS (SEPSIS)
500.0000 mL | Freq: Once | INTRAVENOUS | Status: AC
  Administered 2017-06-21: 500 mL via INTRAVENOUS

## 2017-06-21 MED ORDER — AMOXICILLIN-POT CLAVULANATE 875-125 MG PO TABS: 1.0000 | ORAL_TABLET | Freq: Two times a day (BID) | ORAL | Status: DC

## 2017-06-21 MED ORDER — VERAPAMIL HCL ER 120 MG PO TBCR
120.0000 mg | EXTENDED_RELEASE_TABLET | Freq: Every day | ORAL | Status: DC
  Administered 2017-06-22 – 2017-06-23 (×2): 120 mg via ORAL
  Filled 2017-06-21 (×3): qty 1

## 2017-06-21 MED ORDER — INSULIN ASPART 100 UNIT/ML ~~LOC~~ SOLN: 0.0000 [IU] | Freq: Every day | SUBCUTANEOUS | Status: DC

## 2017-06-21 MED ORDER — STROKE: EARLY STAGES OF RECOVERY BOOK: Freq: Once | Status: DC

## 2017-06-21 MED ORDER — LOSARTAN POTASSIUM-HCTZ 100-25 MG PO TABS: 1.0000 | ORAL_TABLET | Freq: Every day | ORAL | Status: DC

## 2017-06-21 MED ORDER — HYDROCHLOROTHIAZIDE 25 MG PO TABS
25.0000 mg | ORAL_TABLET | Freq: Every day | ORAL | Status: DC
  Administered 2017-06-22: 25 mg via ORAL
  Filled 2017-06-21: qty 1

## 2017-06-21 MED ORDER — SENNOSIDES-DOCUSATE SODIUM 8.6-50 MG PO TABS: 1.0000 | ORAL_TABLET | Freq: Every evening | ORAL | Status: DC | PRN

## 2017-06-21 MED ORDER — NORTRIPTYLINE HCL 25 MG PO CAPS: 25.0000 mg | ORAL_CAPSULE | Freq: Every day | ORAL | Status: DC

## 2017-06-21 MED ORDER — ACETAMINOPHEN 650 MG RE SUPP: 650.0000 mg | RECTAL | Status: DC | PRN

## 2017-06-21 MED ORDER — SODIUM CHLORIDE 0.9 % IV SOLN
INTRAVENOUS | Status: DC
  Administered 2017-06-21: 23:00:00 via INTRAVENOUS

## 2017-06-21 MED ORDER — GUAIFENESIN-CODEINE 100-10 MG/5ML PO SOLN: 5.0000 mL | Freq: Four times a day (QID) | ORAL | Status: DC | PRN

## 2017-06-21 MED ORDER — CITALOPRAM HYDROBROMIDE 40 MG PO TABS: 40.0000 mg | ORAL_TABLET | Freq: Every day | ORAL | Status: DC

## 2017-06-21 MED ORDER — CLINDAMYCIN HCL 300 MG PO CAPS: 300.0000 mg | ORAL_CAPSULE | Freq: Three times a day (TID) | ORAL | Status: DC

## 2017-06-21 MED ORDER — ASPIRIN 81 MG PO CHEW
324.0000 mg | CHEWABLE_TABLET | Freq: Once | ORAL | Status: AC
  Administered 2017-06-21: 324 mg via ORAL
  Filled 2017-06-21: qty 4

## 2017-06-21 NOTE — Progress Notes (Signed)
Patient transferred from [facility] with RN via [facility]link. SBP elevated, all other VSS. Patient alert x4 at this time. [facility] admissions paged. Family at beside and oriented to unit and call bell. Will continue to monitor.

## 2017-06-21 NOTE — ED Notes (Signed)
Patient transported to CT 

## 2017-06-21 NOTE — ED Notes (Signed)
PT noted to be A/Ox4, but pt is slow to respond to questions. Pt states he has difficulty formulating his words. Pt pt's family symptoms of being lethargic, and altered mental status x "few days." No definitive time of when symptoms started.

## 2017-06-21 NOTE — H&P (Signed)
History and Physical    Raymond [name] ZOX:[id number] DOB: 09-18-1951 DOA: 06/21/2017  Referring MD/NP/PA:  Dr. Otis Peak PCP: Forrest Moron, MD  Patient coming from:  Transfer from  Chief Complaint: Difficulty getting words out  HPI: Raymond [name] is a 66 y.o. male with medical history significant of HTN and DM type 2, sleep apnea on CPAP, and depression; who presents with difficulty expressing what he would like to say. The past 24 hours patient was reportedly more withdrawn and out of it per family members. They noticed that he would take long pauses and sometimes just say he couldn't get out what he would like to say which was unusual for him. Over the last 3 weeks patient has had a decline in his health. He was evaluated by his primary care doctor on July 31 for complaints of problems with his right ear and was prescribed Augmentin. At baseline patient has hearing loss on the left, but his right ear was given his reported good ear. The patient was antibiotics and became sick with nausea and vomiting for which his evaluated on 8/6, and found to have acute cholecystitis. He underwent laparoscopic cholecystectomy on 8/7. The following week family notes that he was diagnosed with pneumonia on 8/11 and has been on clindamycin since that time. Associated symptoms include decreased hearing out of the right ear. Patient will has been set up to see a ENT specialist sometime next week.   ED Course: Upon admission into the emergency department patient was seen to be afebrile, pulse 49-58, respirations 17-22, blood pressure is elevated to 183/76, and O2 saturations 95-90% on room air. Labs revealed a blood glucose of 222. CT scan of the brain showing acute to subacute infarct on the left caudate head and putamen. Patient transferred to a telemetry bed at [facility] for completion of stroke workup.  Review of Systems: Review of Systems  Constitutional: Positive for malaise/fatigue. Negative for weight loss.    HENT: Positive for hearing loss. Negative for sinus pain.   Eyes: Negative for photophobia.  Respiratory: Negative for cough and shortness of breath.   Cardiovascular: Negative for chest pain and claudication.  Gastrointestinal: Negative for abdominal pain.  Genitourinary: Negative for frequency and hematuria.  Musculoskeletal: Negative for back pain and joint pain.  Skin: Negative for itching and rash.  Neurological: Positive for speech change.  Psychiatric/Behavioral: The patient is not nervous/anxious.     Past Medical History:  Diagnosis Date  . Arthritis   . Depression   . Diabetes mellitus type I (HCC)   . Hypertension   . Migraines   . Sleep apnea     Past Surgical History:  Procedure Laterality Date  . CHOLECYSTECTOMY    . KNEE ARTHROSCOPY    . ROTATOR CUFF REPAIR       reports that he has been smoking Cigars.  He has never used smokeless tobacco. He reports that he does not drink alcohol or use drugs.  Allergies  Allergen Reactions  . Flomax [Tamsulosin Hcl]     Effects bp     History reviewed. No pertinent family history.  Prior to Admission medications   Medication Sig Start Date End Date Taking? Authorizing Provider  acetaminophen (TYLENOL) 500 MG tablet Take 2 tablets (1,000 mg total) by mouth every 6 (six) hours as needed. 04/23/16   Arby Barrette, MD  amoxicillin-clavulanate (AUGMENTIN) 875-125 MG tablet Take 1 tablet by mouth every 12 (twelve) hours. 06/14/17   Mackuen, Cindee Salt, MD  benzonatate (TESSALON PERLES) 100 MG capsule Take 1 capsule (100 mg total) by mouth 3 (three) times daily as needed for cough. 06/14/17   Mackuen, Courteney Lyn, MD  citalopram (CELEXA) 40 MG tablet Take 1 tablet (40 mg total) by mouth daily. 01/08/16   Arfeen, Phillips Grout, MD  clindamycin (CLEOCIN) 300 MG capsule Take 1 capsule (300 mg total) by mouth 3 (three) times daily. 06/14/17   Mackuen, Courteney Lyn, MD  guaiFENesin-codeine 100-10 MG/5ML syrup Take 5 mLs by mouth every  6 (six) hours as needed for cough. 06/14/17   Mackuen, Courteney Lyn, MD  hydrOXYzine (ATARAX/VISTARIL) 50 MG tablet Take 1 tablet (50 mg total) by mouth at bedtime as needed for anxiety. 01/08/16   Arfeen, Phillips Grout, MD  losartan-hydrochlorothiazide (HYZAAR) 100-25 MG per tablet Take 1 tablet by mouth daily.    [provider]  metFORMIN (GLUCOPHAGE) 500 MG tablet Take 500 mg by mouth 3 (three) times daily. 11/21/15   [provider]  metoprolol tartrate (LOPRESSOR) 100 MG tablet Take 100 mg by mouth 2 (two) times daily.    [provider]  naproxen (NAPROSYN) 500 MG tablet Take 1 tablet (500 mg total) by mouth 3 (three) times daily as needed. 10/26/12   Charm Rings, NP  nortriptyline (PAMELOR) 25 MG capsule Take 1 capsule (25 mg total) by mouth at bedtime. 01/08/16   Arfeen, Phillips Grout, MD  verapamil (CALAN-SR) 120 MG CR tablet  11/11/14   [provider]    Physical Exam:  Constitutional: NAD, calm, comfortable Vitals:   06/21/17 1900 06/21/17 1930 06/21/17 2000 06/21/17 2107  BP: (!) 159/80 (!) 170/83 (!) 151/74 (!) 183/76  Pulse: (!) 49 (!) 51 (!) 58 (!) 58  Resp: 17 18 20 20   Temp:    98.4 F (36.9 C)  TempSrc:    Oral  SpO2: 97% 98% 97% 99%  Weight:    102 kg (224 lb 13.9 oz)  Height:    5\' 4"  (1.626 m)   Eyes: PERRL, lids and conjunctivae normal ENMT: Mucous membranes are moist. Cerumen present and both ureters for which unable to visualize tympanic membrane. Posterior pharynx clear of any exudate or lesions.Normal dentition.  Neck: normal, supple, no masses, no thyromegaly Respiratory: clear to auscultation bilaterally, no wheezing, no crackles. Normal respiratory effort. No accessory muscle use.  Cardiovascular: Regular rate and rhythm, no murmurs / rubs / gallops. No extremity edema. 2+ pedal pulses. No carotid bruits.  Abdomen: no tenderness, no masses palpated. No hepatosplenomegaly. Bowel sounds positive.  Musculoskeletal: no clubbing / cyanosis.  No joint deformity upper and lower extremities. Good ROM, no contractures. Normal muscle tone.  Skin: no rashes, lesions, ulcers. No induration Neurologic: CN 2-12 grossly intact. Sensation intact, DTR normal. Strength 5/5 in all 4.  Psychiatric: Normal judgment and insight. Alert and oriented x 3. Normal mood.     Labs on Admission: I have personally reviewed following labs and imaging studies  CBC:  Recent Labs Lab 06/21/17 1520  WBC 6.4  NEUTROABS 4.3  HGB 13.8  HCT 39.7  MCV 85.6  PLT 280   Basic Metabolic Panel:  Recent Labs Lab 06/21/17 1520  NA 137  K 3.9  CL 101  CO2 27  GLUCOSE 222*  BUN 15  CREATININE 1.13  CALCIUM 8.7*   GFR: Estimated Creatinine Clearance: 68.5 mL/min (by C-G formula based on SCr of 1.13 mg/dL). Liver Function Tests:  Recent Labs Lab 06/21/17 1520  AST 25  ALT 28  ALKPHOS 33*  BILITOT 0.5  PROT 6.4*  ALBUMIN 3.4*    Recent Labs Lab 06/21/17 1520  LIPASE 42   No results for input(s): AMMONIA in the last 168 hours. Coagulation Profile: No results for input(s): INR, PROTIME in the last 168 hours. Cardiac Enzymes:  Recent Labs Lab 06/21/17 1522  TROPONINI <0.03   BNP (last 3 results) No results for input(s): PROBNP in the last 8760 hours. HbA1C: No results for input(s): HGBA1C in the last 72 hours. CBG: No results for input(s): GLUCAP in the last 168 hours. Lipid Profile: No results for input(s): CHOL, HDL, LDLCALC, TRIG, CHOLHDL, LDLDIRECT in the last 72 hours. Thyroid Function Tests: No results for input(s): TSH, T4TOTAL, FREET4, T3FREE, THYROIDAB in the last 72 hours. Anemia Panel: No results for input(s): VITAMINB12, FOLATE, FERRITIN, TIBC, IRON, RETICCTPCT in the last 72 hours. Urine analysis:    Component Value Date/Time   COLORURINE YELLOW 06/21/2017 1506   APPEARANCEUR CLEAR 06/21/2017 1506   LABSPEC 1.023 06/21/2017 1506   PHURINE 6.5 06/21/2017 1506   GLUCOSEU 250 (A) 06/21/2017 1506   HGBUR  NEGATIVE 06/21/2017 1506   BILIRUBINUR NEGATIVE 06/21/2017 1506   KETONESUR NEGATIVE 06/21/2017 1506   PROTEINUR NEGATIVE 06/21/2017 1506   UROBILINOGEN 2.0 (H) 10/19/2010 0015   NITRITE NEGATIVE 06/21/2017 1506   LEUKOCYTESUR NEGATIVE 06/21/2017 1506   Sepsis Labs: No results found for this or any previous visit (from the past 240 hour(s)).   Radiological Exams on Admission: Dg Chest 2 View  Result Date: 06/21/2017 CLINICAL DATA:  Presentation with recent stroke. EXAM: CHEST  2 VIEW COMPARISON:  06/14/2017 FINDINGS: Heart size is normal. Chronic aortic atherosclerosis. Chronic atelectasis or scarring at the lung bases. No sign of active infiltrate, mass, effusion or collapse. IMPRESSION: Chronic basilar scarring or mild basilar atelectasis. No active disease otherwise. Electronically Signed   By: Paulina Fusi M.D.   On: 06/21/2017 15:59   Ct Head Wo Contrast  Result Date: 06/21/2017 CLINICAL DATA:  Focal neurological deficit. Suspected stroke. Greater than 6 hours onset. EXAM: CT HEAD WITHOUT CONTRAST TECHNIQUE: Contiguous axial images were obtained from the base of the skull through the vertex without intravenous contrast. 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 Low-density but without hemorrhage or mass effect. Mild chronic small-vessel changes of the deep white matter. No mass lesion, hydrocephalus or extra-axial collection. Vascular: No abnormal vascular finding. Skull: Negative Sinuses/Orbits: Clear/normal Other: None IMPRESSION: Newly seen low-density in the left caudate head and putamen on the left consistent with late acute/ early subacute infarction. No hemorrhage. Electronically Signed   By: Paulina Fusi M.D.   On: 06/21/2017 15:58    EKG: Independently reviewed. Normal sinus rhythm  Assessment/Plan Expressive aphasia 2/2 Stroke: Acute/subacute. Patient expressive aphasia. Imaging  studies revealed a new low density in the left caudate head and putamen consistent with late acute/early subacute infarct. Neurology was consulted and recommended completion of stroke workup. - Admit to telemetry bed - Stroke order set initiated - Neuro checks - Check  MRI/MRA head w/o contrast  - PT/OT/Speech to eval and treat - Check echocardiogram and Vas carotid U/S - Check Hemoglobin A1c and lipid panel in a.m. - ASA - Appreciate Dr. Otelia Limes of neurology consultative services, will follow-up   Hearing loss right ear, suspected otitis media: Patient reports previously being diagnosed with otitis media. Appears did not complete course of Augmentin. Home physical exam both ears have  cerumen present with poor physical cessation of tympanic membrane. Question if related to possible stroke. Patient has been treated with Augmentin and clindamycin for community-acquired pneumonia. - Clean out ears, reevaluate in a.m. - Follow-up with ENT as outpatient    Essential hypertension - Continue losartan-hydrochlorothiazide and  verapamil   Community-acquired pneumonia: Recently diagnosed on an patient reported being just about to complete his antibiotic course. Chest x-ray clear. - Consider need of continuation of antibiotics  Inner ear dysfunction - Continue propranolol and Dilantin  Depression - Continue Celexa   Diabetes mellitus type 2: Patient reports only being on oral medications of metformin at home. - Hypoglycemic protocols - Hold metformin - Follow-up hemoglobin A1c - CBGs every before meals and at bedtime with sensitive SSI   OSA on CPAP - CPAP at night   DVT prophylaxis:  lovenox   Code Status: Full Family Communication:  Discussed plan of care with patient family present at bedside Disposition Plan: Likely discharge home once stable in 1-2 days  Consults called: neuro  Admission status: inpatient   Clydie Braun MD [facility] Pager [phone]   If  7PM-7AM, please contact night-coverage [email or web address] Password [facility]  06/21/2017, 9:19 PM

## 2017-06-21 NOTE — ED Provider Notes (Signed)
Emergency Department Provider Note   I have reviewed the triage vital signs and the nursing notes.   HISTORY  Chief Complaint Altered Mental Status   HPI Raymond [name] is a 66 y.o. male with PMH of DM, HTN, arthritis, and recent cholecystectomy and outpatient HCAP treatment presents to the ED for evaluation of worsening confusion and new onset word finding difficulty. Family state that the past 2 weeks he has had bad hearing specifically out of the right ear. He presented to the emergency department with abdominal pain and fever. He was ultimately taken for laparoscopic cholecystectomy. He was discharged after that return to the emergency department with cough and found to have infiltrate on chest x-ray. He was started on Levaquin and was soon transitioned to clindamycin and Augmentin which she is currently taking. He continues to have difficulty following conversation and inability to hear out of the right ear. Family brought in today because they feel like he is having significant difficulty finding the right words which is very different from the ways been acting over the past 2 weeks. He denies any numbness or weakness. No vision changes. No difficulty swallowing. No history of stroke. Earlier today was also complaining of some intermittent left chest pain that is since resolved. No fevers or chills. No dysuria, hesitancy, urgency. No modifying factors. No radiation of symptoms.   Past Medical History:  Diagnosis Date  . Arthritis   . Depression   . Diabetes mellitus type 2 in obese (HCC)   . Hypertension   . Migraines   . Sleep apnea     Patient Active Problem List   Diagnosis Date Noted  . Depression 06/22/2017  . Diabetes mellitus type 2 in obese (HCC) 06/22/2017  . Inner ear dysfunction 06/22/2017  . Essential hypertension 06/22/2017  . OSA on CPAP 06/22/2017  . Expressive aphasia 06/22/2017  . Stroke (HCC) 06/21/2017  . CVA (cerebral vascular accident) (HCC) 06/21/2017   . Major depressive disorder, recurrent (HCC) 10/21/2012    Past Surgical History:  Procedure Laterality Date  . CHOLECYSTECTOMY    . KNEE ARTHROSCOPY    . ROTATOR CUFF REPAIR        Allergies Flomax [tamsulosin hcl]  History reviewed. No pertinent family history.  Social History Social History  Substance Use Topics  . Smoking status: Light Tobacco Smoker    Types: Cigars  . Smokeless tobacco: Never Used  . Alcohol use No    Review of Systems  Constitutional: No fever/chills Eyes: No visual changes. ENT: No sore throat. Difficulty hearing out of left ear.  Cardiovascular: Positive left chest pain. Respiratory: Denies shortness of breath. Gastrointestinal: No abdominal pain.  No nausea, no vomiting.  No diarrhea.  No constipation. Genitourinary: Negative for dysuria. Musculoskeletal: Negative for back pain. Skin: Negative for rash. Neurological: Negative for headaches, focal weakness or numbness. Positive word finding difficulty and general confusion.   10-point ROS otherwise negative.  ____________________________________________   PHYSICAL EXAM:  VITAL SIGNS: Vitals:   06/22/17 0400 06/22/17 0800  BP: (!) 150/71 (!) 150/72  Pulse:  (!) 54  Resp: 16 18  Temp: 98.1 F (36.7 C) 98 F (36.7 C)  SpO2: 98% 98%     Constitutional: Alert and oriented to self but has difficulty with month/year and location. Ultimately able to tell me August 2018 after several attempts.  Eyes: Conjunctivae are normal. PERRL. EOMI. Head: Atraumatic. Ears:  Healthy appearing ear canals and TMs bilaterally. Mild cerumen in the right ear canal.  Nose: No congestion/rhinnorhea. Mouth/Throat: Mucous membranes are moist. Neck: No stridor.   Cardiovascular: Normal rate, regular rhythm. Good peripheral circulation. Grossly normal heart sounds.   Respiratory: Normal respiratory effort.  No retractions. Lungs CTAB. Gastrointestinal: Soft and nontender. No distention.    Musculoskeletal: No lower extremity tenderness nor edema. No gross deformities of extremities. Neurologic:  Normal language but notable confusion and word-finding difficulty. Speech not completely fluent. No gross focal neurologic deficits are appreciated. Normal CN exam 2-12. No pronator drift.  Skin:  Skin is warm, dry and intact. No rash noted.  ____________________________________________   LABS (all labs ordered are listed, but only abnormal results are displayed)  Labs Reviewed  COMPREHENSIVE METABOLIC PANEL - Abnormal; Notable for the following:       Result Value   Glucose, Bld 222 (*)    Calcium 8.7 (*)    Total Protein 6.4 (*)    Albumin 3.4 (*)    Alkaline Phosphatase 33 (*)    All other components within normal limits  URINALYSIS, ROUTINE W REFLEX MICROSCOPIC - Abnormal; Notable for the following:    Glucose, UA 250 (*)    All other components within normal limits  HEMOGLOBIN A1C - Abnormal; Notable for the following:    Hgb A1c MFr Bld 6.1 (*)    All other components within normal limits  LIPID PANEL - Abnormal; Notable for the following:    HDL 30 (*)    LDL Cholesterol 112 (*)    All other components within normal limits  GLUCOSE, CAPILLARY - Abnormal; Notable for the following:    Glucose-Capillary 160 (*)    All other components within normal limits  GLUCOSE, CAPILLARY - Abnormal; Notable for the following:    Glucose-Capillary 102 (*)    All other components within normal limits  CBC WITH DIFFERENTIAL/PLATELET - Abnormal; Notable for the following:    HCT 38.4 (*)    All other components within normal limits  URINE CULTURE  CULTURE, BLOOD (ROUTINE X 2)  CULTURE, BLOOD (ROUTINE X 2)  LIPASE, BLOOD  CBC WITH DIFFERENTIAL/PLATELET  TROPONIN I  RAPID URINE DRUG SCREEN, HOSP PERFORMED  COMPREHENSIVE METABOLIC PANEL  MAGNESIUM  PHOSPHORUS   ____________________________________________  EKG  Reviewed in MUSE but not crossing over.   Rate: 54. Normal  sinus rhythm. Normal axis. Narrow QRS. No STEMI.  ____________________________________________  RADIOLOGY  Dg Chest 2 View  Result Date: 06/21/2017 CLINICAL DATA:  Presentation with recent stroke. EXAM: CHEST  2 VIEW COMPARISON:  06/14/2017 FINDINGS: Heart size is normal. Chronic aortic atherosclerosis. Chronic atelectasis or scarring at the lung bases. No sign of active infiltrate, mass, effusion or collapse. IMPRESSION: Chronic basilar scarring or mild basilar atelectasis. No active disease otherwise. Electronically Signed   By: Paulina Fusi M.D.   On: 06/21/2017 15:59   Ct Head Wo Contrast  Result Date: 06/21/2017 CLINICAL DATA:  Focal neurological deficit. Suspected stroke. Greater than 6 hours onset. EXAM: CT HEAD WITHOUT CONTRAST TECHNIQUE: Contiguous axial images were obtained from the base of the skull through the vertex without intravenous contrast. 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 Low-density but without hemorrhage or mass effect. Mild chronic small-vessel changes of the deep white matter. No mass lesion, hydrocephalus or extra-axial collection. Vascular: No abnormal vascular finding. Skull: Negative Sinuses/Orbits: Clear/normal Other: None IMPRESSION: Newly seen low-density in the left caudate head and putamen on the left consistent with late acute/ early  subacute infarction. No hemorrhage. Electronically Signed   By: Paulina Fusi M.D.   On: 06/21/2017 15:58    ____________________________________________   PROCEDURES  Procedure(s) performed:   Procedures  none ____________________________________________   INITIAL IMPRESSION / ASSESSMENT AND PLAN / ED COURSE  Pertinent labs & imaging results that were available during my care of the patient were reviewed by me and considered in my medical decision making (see chart for details).  Patient presents to the emergency  department with mild confusion over the past 2 weeks but sudden onset word finding difficulty. Last normal 2 days prior. No focal neurological deficits on exam. The patient does have some confusion and does seem to have difficulty finding the correct word. He often hesitates or defers answering to family. Unclear if this represents medication reaction, active infection, stroke. Recently diagnosed with pneumonia after cholecystectomy. Currently on clindamycin and Augmentin but that is the only new medications. Also with some left-sided chest discomfort earlier today that seems mostly musculoskeletal by history.  CT with evidence of subacute infarct. Spoke with Dr. Tia Alert with Neurology who agrees with admission to hospitalist at [facility] and MRI with further CVA w/u. Discussed with family and patient who are in agreement.   Discussed patient's case with Hospitalist, Dr. Hanley Ben. Patient and family (if present) updated with plan. Care transferred to Hospitalist service.  I reviewed all nursing notes, vitals, pertinent old records, EKGs, labs, imaging (as available).  ____________________________________________  FINAL CLINICAL IMPRESSION(S) / ED DIAGNOSES  Final diagnoses:  Word finding difficulty  Disorientation     MEDICATIONS GIVEN DURING THIS VISIT:  Medications   stroke: mapping our early stages of recovery book (not administered)  0.9 %  sodium chloride infusion ( Intravenous New Bag/Given 06/21/17 2327)  acetaminophen (TYLENOL) tablet 650 mg (not administered)    Or  acetaminophen (TYLENOL) solution 650 mg (not administered)    Or  acetaminophen (TYLENOL) suppository 650 mg (not administered)  senna-docusate (Senokot-S) tablet 1 tablet (not administered)  enoxaparin (LOVENOX) injection 40 mg (40 mg Subcutaneous Given 06/21/17 2327)  aspirin chewable tablet 324 mg (not administered)  insulin aspart (novoLOG) injection 0-9 Units (0 Units Subcutaneous Not Given 06/22/17 0800)  insulin aspart  (novoLOG) injection 0-5 Units (0 Units Subcutaneous Not Given 06/21/17 2327)  guaiFENesin-codeine 100-10 MG/5ML solution 5 mL (not administered)  verapamil (CALAN-SR) CR tablet 120 mg (not administered)  losartan (COZAAR) tablet 100 mg (not administered)    And  hydrochlorothiazide (HYDRODIURIL) tablet 25 mg (not administered)  propranolol (INDERAL) tablet 20 mg (20 mg Oral Given 06/22/17 0056)  phenytoin (DILANTIN) ER capsule 100 mg (100 mg Oral Given 06/22/17 0045)  atorvastatin (LIPITOR) tablet 20 mg (not administered)  iopamidol (ISOVUE-370) 76 % injection (not administered)  sodium chloride 0.9 % bolus 500 mL (0 mLs Intravenous Stopped 06/21/17 1625)  aspirin chewable tablet 324 mg (324 mg Oral Given 06/21/17 1703)     NEW OUTPATIENT MEDICATIONS STARTED DURING THIS VISIT:  None   Note:  This document was prepared using Dragon voice recognition software and may include unintentional dictation errors.  Alona Bene, MD Emergency Medicine    Nora Sabey, Arlyss Repress, MD 06/22/17 1016

## 2017-06-21 NOTE — ED Triage Notes (Addendum)
Patient was here on the 11th for a cough post gallbladder surgery. His family reports that he is now becoming more sleepy and confused. Patient reports that he is having " a hard time getting my words out" . Patient is also reports that he has full ears, and he is having a hard time hearing. The patient noticeably is having frustration with the difficulty talking with this triage nurse

## 2017-06-22 ENCOUNTER — Encounter (HOSPITAL_COMMUNITY): Payer: Self-pay

## 2017-06-22 ENCOUNTER — Inpatient Hospital Stay (HOSPITAL_COMMUNITY): Payer: Medicare Other

## 2017-06-22 DIAGNOSIS — Z9989 Dependence on other enabling machines and devices: Secondary | ICD-10-CM

## 2017-06-22 DIAGNOSIS — I63412 Cerebral infarction due to embolism of left middle cerebral artery: Principal | ICD-10-CM

## 2017-06-22 DIAGNOSIS — R4789 Other speech disturbances: Secondary | ICD-10-CM

## 2017-06-22 DIAGNOSIS — R410 Disorientation, unspecified: Secondary | ICD-10-CM

## 2017-06-22 DIAGNOSIS — I10 Essential (primary) hypertension: Secondary | ICD-10-CM | POA: Diagnosis present

## 2017-06-22 DIAGNOSIS — R4701 Aphasia: Secondary | ICD-10-CM | POA: Diagnosis present

## 2017-06-22 DIAGNOSIS — E1169 Type 2 diabetes mellitus with other specified complication: Secondary | ICD-10-CM | POA: Diagnosis present

## 2017-06-22 DIAGNOSIS — E669 Obesity, unspecified: Secondary | ICD-10-CM

## 2017-06-22 DIAGNOSIS — G4733 Obstructive sleep apnea (adult) (pediatric): Secondary | ICD-10-CM

## 2017-06-22 DIAGNOSIS — F32A Depression, unspecified: Secondary | ICD-10-CM | POA: Diagnosis present

## 2017-06-22 DIAGNOSIS — H8390 Unspecified disease of inner ear, unspecified ear: Secondary | ICD-10-CM | POA: Diagnosis present

## 2017-06-22 DIAGNOSIS — I63512 Cerebral infarction due to unspecified occlusion or stenosis of left middle cerebral artery: Secondary | ICD-10-CM

## 2017-06-22 DIAGNOSIS — F329 Major depressive disorder, single episode, unspecified: Secondary | ICD-10-CM | POA: Diagnosis present

## 2017-06-22 DIAGNOSIS — I639 Cerebral infarction, unspecified: Secondary | ICD-10-CM

## 2017-06-22 LAB — MAGNESIUM: MAGNESIUM: 1.9 mg/dL (ref 1.7–2.4)

## 2017-06-22 LAB — CBC WITH DIFFERENTIAL/PLATELET
Basophils Absolute: 0 10*3/uL (ref 0.0–0.1)
Basophils Relative: 1 %
Eosinophils Absolute: 0.2 10*3/uL (ref 0.0–0.7)
Eosinophils Relative: 3 %
HEMATOCRIT: 38.4 % — AB (ref 39.0–52.0)
Hemoglobin: 13.1 g/dL (ref 13.0–17.0)
LYMPHS PCT: 28 %
Lymphs Abs: 1.4 10*3/uL (ref 0.7–4.0)
MCH: 28.7 pg (ref 26.0–34.0)
MCHC: 34.1 g/dL (ref 30.0–36.0)
MCV: 84.2 fL (ref 78.0–100.0)
MONO ABS: 0.4 10*3/uL (ref 0.1–1.0)
MONOS PCT: 8 %
NEUTROS ABS: 3 10*3/uL (ref 1.7–7.7)
Neutrophils Relative %: 60 %
Platelets: 235 10*3/uL (ref 150–400)
RBC: 4.56 MIL/uL (ref 4.22–5.81)
RDW: 12.4 % (ref 11.5–15.5)
WBC: 5 10*3/uL (ref 4.0–10.5)

## 2017-06-22 LAB — COMPREHENSIVE METABOLIC PANEL
ALT: 24 U/L (ref 17–63)
ANION GAP: 6 (ref 5–15)
AST: 22 U/L (ref 15–41)
Albumin: 2.9 g/dL — ABNORMAL LOW (ref 3.5–5.0)
Alkaline Phosphatase: 27 U/L — ABNORMAL LOW (ref 38–126)
BILIRUBIN TOTAL: 0.5 mg/dL (ref 0.3–1.2)
BUN: 13 mg/dL (ref 6–20)
CO2: 26 mmol/L (ref 22–32)
Calcium: 8.1 mg/dL — ABNORMAL LOW (ref 8.9–10.3)
Chloride: 104 mmol/L (ref 101–111)
Creatinine, Ser: 1.12 mg/dL (ref 0.61–1.24)
GFR calc Af Amer: >60 mL/min (ref >60)
Glucose, Bld: 168 mg/dL — ABNORMAL HIGH (ref 65–99)
POTASSIUM: 3.6 mmol/L (ref 3.5–5.1)
Sodium: 136 mmol/L (ref 135–145)
TOTAL PROTEIN: 5.4 g/dL — AB (ref 6.5–8.1)

## 2017-06-22 LAB — GLUCOSE, CAPILLARY
GLUCOSE-CAPILLARY: 102 mg/dL — AB (ref 65–99)
GLUCOSE-CAPILLARY: 133 mg/dL — AB (ref 65–99)
GLUCOSE-CAPILLARY: 125 mg/dL — AB (ref 65–99)
Glucose-Capillary: 113 mg/dL — ABNORMAL HIGH (ref 65–99)

## 2017-06-22 LAB — LIPID PANEL
CHOL/HDL RATIO: 5.6 ratio
CHOLESTEROL: 168 mg/dL (ref 0–200)
HDL: 30 mg/dL — ABNORMAL LOW (ref >40)
LDL Cholesterol: 112 mg/dL — ABNORMAL HIGH (ref 0–99)
Triglycerides: 130 mg/dL (ref <150)
VLDL: 26 mg/dL (ref 0–40)

## 2017-06-22 LAB — HEMOGLOBIN A1C
Hgb A1c MFr Bld: 6.1 % — ABNORMAL HIGH (ref 4.8–5.6)
MEAN PLASMA GLUCOSE: 128.37 mg/dL

## 2017-06-22 LAB — PHOSPHORUS: Phosphorus: 3.3 mg/dL (ref 2.5–4.6)

## 2017-06-22 LAB — RAPID URINE DRUG SCREEN, HOSP PERFORMED
Amphetamines: NOT DETECTED
Barbiturates: NOT DETECTED
Benzodiazepines: NOT DETECTED
COCAINE: NOT DETECTED
OPIATES: NOT DETECTED
Tetrahydrocannabinol: NOT DETECTED

## 2017-06-22 MED ORDER — CLINDAMYCIN HCL 300 MG PO CAPS: 300.0000 mg | ORAL_CAPSULE | Freq: Once | ORAL | Status: DC

## 2017-06-22 MED ORDER — IOPAMIDOL (ISOVUE-370) INJECTION 76%
INTRAVENOUS | Status: AC
  Administered 2017-06-22: 50 mL
  Filled 2017-06-22: qty 50

## 2017-06-22 MED ORDER — PHENYTOIN SODIUM EXTENDED 100 MG PO CAPS
100.0000 mg | ORAL_CAPSULE | Freq: Two times a day (BID) | ORAL | Status: DC
  Administered 2017-06-22 – 2017-06-23 (×5): 100 mg via ORAL
  Filled 2017-06-22 (×4): qty 1

## 2017-06-22 MED ORDER — IOPAMIDOL (ISOVUE-370) INJECTION 76%
INTRAVENOUS | Status: AC
  Filled 2017-06-22: qty 50

## 2017-06-22 MED ORDER — CLOPIDOGREL BISULFATE 75 MG PO TABS
75.0000 mg | ORAL_TABLET | Freq: Every day | ORAL | Status: DC
  Administered 2017-06-22 – 2017-06-23 (×2): 75 mg via ORAL
  Filled 2017-06-22 (×2): qty 1

## 2017-06-22 MED ORDER — PROPRANOLOL HCL 20 MG PO TABS
20.0000 mg | ORAL_TABLET | Freq: Two times a day (BID) | ORAL | Status: DC
  Administered 2017-06-22 – 2017-06-23 (×5): 20 mg via ORAL
  Filled 2017-06-22 (×7): qty 1

## 2017-06-22 MED ORDER — PHENYTOIN SODIUM EXTENDED 100 MG PO CAPS
100.0000 mg | ORAL_CAPSULE | Freq: Two times a day (BID) | ORAL | Status: DC
  Filled 2017-06-22: qty 1

## 2017-06-22 MED ORDER — ATORVASTATIN CALCIUM 10 MG PO TABS
20.0000 mg | ORAL_TABLET | Freq: Every day | ORAL | Status: DC
  Administered 2017-06-22 – 2017-06-24 (×3): 20 mg via ORAL
  Filled 2017-06-22 (×3): qty 2

## 2017-06-22 NOTE — Evaluation (Signed)
Physical Therapy Evaluation Patient Details Name: Raymond [name] MRN: [id number] DOB: Oct 30, 1951 Today's Date: 06/22/2017   History of Present Illness  Terance Pomplun Allenis an 66 y.o.malewho has had multiple medical problems recently with recurrent admissions, including ear infection followed by cholecystectomy (8/7) and then pneumonia (8/11), who developed confusion and difficulty speaking on Saturday. He was initially seen at [facility] and was transferred to [facility] for further care. PMH significant for arthritis, depression, diabetes mellitus type 2 in obese, hypertension, migraines, and sleep apnea. MRI revealed acute perforator infarct affecting L striatum and internal capsule anterior limb as well as remote injuries in L MCA watershed territory.  Clinical Impression  Pt presents with the above diagnosis and below deficits for therapy evaluation. Prior to admission, pt lived with his wife, daughter and her family in a two story home. 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 Pt is able to perform mobility with supervision this session and has delayed processing with answering questions. Pt will benefit from continued acute PT follow-up in order to address the below deficits prior to D/C.     Follow Up Recommendations No PT follow up    Equipment Recommendations  None recommended by PT    Recommendations for Other Services       Precautions / Restrictions Precautions Precautions: Fall Restrictions Weight Bearing Restrictions: No      Mobility  Bed Mobility Overal bed mobility: Needs Assistance Bed Mobility: Sit to Supine     Supine to sit: Supervision Sit to supine: Supervision   General bed mobility comments: Supervision for safety.   Transfers Overall transfer level: Needs assistance Equipment used: None Transfers: Sit to/from Stand Sit to Stand: Supervision         General transfer comment: Supervision for safety during  sit<>stand.   Ambulation/Gait Ambulation/Gait assistance: Supervision Ambulation Distance (Feet): 350 Feet Assistive device: None Gait Pattern/deviations: Step-through pattern Gait velocity: decreased Gait velocity interpretation: Below normal speed for age/gender General Gait Details: slow, steady gait with no LOB or staggering noted.   Stairs            Wheelchair Mobility    Modified Rankin (Stroke Patients Only)       Balance Overall balance assessment: Needs assistance Sitting-balance support: No upper extremity supported;Feet supported Sitting balance-Leahy Scale: Good     Standing balance support: No upper extremity supported;During functional activity Standing balance-Leahy Scale: Fair                               Pertinent Vitals/Pain Pain Assessment: No/denies pain    Home Living Family/patient expects to be discharged to:: Private residence Living Arrangements: Spouse/significant other;Children Available Help at Discharge: Family;Available 24 hours/day Type of Home: House Home Access: Stairs to enter Entrance Stairs-Rails:  (daughter reports they can install these) Entrance Stairs-Number of Steps: 4 Home Layout: Two level;Bed/bath upstairs Home Equipment: Walker - 2 wheels;Cane - single point;Bedside commode      Prior Function Level of Independence: Independent               Hand Dominance   Dominant Hand: Right    Extremity/Trunk Assessment   Upper Extremity Assessment Upper Extremity Assessment: Defer to OT evaluation    Lower Extremity Assessment Lower Extremity Assessment: Generalized weakness    Cervical / Trunk Assessment Cervical / Trunk Assessment: Normal  Communication   Communication: Other (comment) (increased time  to process and respond)  Cognition Arousal/Alertness: Awake/alert Behavior During Therapy: WFL for tasks assessed/performed Overall Cognitive Status: Difficult to assess                                  General Comments: pt is able to talk and walk with PT this session. Pt does have to stop when questions become confusing. Some answers are not in line with questions.       General Comments      Exercises     Assessment/Plan    PT Assessment Patient needs continued PT services  PT Problem List Decreased strength;Decreased activity tolerance;Decreased balance;Decreased mobility       PT Treatment Interventions DME instruction;Gait training;Stair training;Functional mobility training;Therapeutic activities;Therapeutic exercise;Balance training    PT Goals (Current goals can be found in the Care Plan section)  Acute Rehab PT Goals Patient Stated Goal: get back to caring for his granddaughter PT Goal Formulation: With patient Time For Goal Achievement: 06/29/17 Potential to Achieve Goals: Good    Frequency Min 4X/week   Barriers to discharge        Co-evaluation               AM-PAC PT "6 Clicks" Daily Activity  Outcome Measure Difficulty turning over in bed (including adjusting bedclothes, sheets and blankets)?: None Difficulty moving from lying on back to sitting on the side of the bed? : None Difficulty sitting down on and standing up from a chair with arms (e.g., wheelchair, bedside commode, etc,.)?: None Help needed moving to and from a bed to chair (including a wheelchair)?: None Help needed walking in hospital room?: A Little Help needed climbing 3-5 steps with a railing? : A Little 6 Click Score: 22    End of Session Equipment Utilized During Treatment: Gait belt Activity Tolerance: Patient tolerated treatment well Patient left: in bed;with call bell/phone within reach;Other (comment);with family/visitor present (MD in room) Nurse Communication: Mobility status PT Visit Diagnosis: Muscle weakness (generalized) (M62.81);Difficulty in walking, not elsewhere classified (R26.2)    Time: 8003-4917 PT Time Calculation (min) (ACUTE  ONLY): 11 min   Charges:   PT Evaluation $PT Eval Moderate Complexity: 1 Mod     PT G Codes:        Colin Broach PT, DPT  [phone]   Ruel Favors Aletha Halim 06/22/2017, 3:44 PM

## 2017-06-22 NOTE — Progress Notes (Signed)
OT Cancellation Note  Patient Details Name: Raymond [name] MRN: [id number] DOB: 03/21/1951   Cancelled Treatment:    Reason Eval/Treat Not Completed: Patient at procedure or test/ unavailable. Pt off unit for testing on OT arrival. Will check back as able to initiate evaluation.   Doristine Section, MS OTR/L  Pager: [phone]   Sacha Radloff A Talishia Betzler 06/22/2017, 11:04 AM

## 2017-06-22 NOTE — Progress Notes (Signed)
STROKE TEAM PROGRESS NOTE   HISTORY OF PRESENT ILLNESS (per record) Raymond [name] is an 66 y.o. male who has had multiple medical problems recently with recurrent admissions, including ear infection followed by gall bladder surgery and then pneumonia, who developed confusion and difficulty speaking on Saturday. He was initially seen at [facility] and was transferred to [facility] for further care. He has been more sleepy and confused. Speech deficit is described as having "a hard time getting my words out". Due to his recent ear infection, he has persisting decreased auditory acuity in his right ear. In triage, he had noticeable difficulty talking with the triage nurse. He has had "altered mental status" for "a few days" with no clearly defined time of onset.    SUBJECTIVE (INTERVAL HISTORY) His daughter and wife are at the bedside.  Patient and family stated that for the last 2 weeks patient had right ear infection treated by PCP with antibiotics however now right ear hearing loss. They would like to have a ENT consultation during the admission for right side hearing loss. He also had gallbladder surgery followed by pneumonia. Found to have speech difficulties and admitted to hospital, CT showed left BG infarct. As per family, patient's speech improved overnight.  Patient has chronic tinnitus and vertigo, followed with ENT in the past. Symptomatic treatment, etiology unclear. He was put on low-dose Dilantin and low-dose propranolol for vertigo treatment. He is also on verapamil for blood pressure control.   OBJECTIVE Temp:  [97.7 F (36.5 C)-98.4 F (36.9 C)] 98 F (36.7 C) (08/19 0800) Pulse Rate:  [49-58] 54 (08/19 0800) Cardiac Rhythm: Sinus bradycardia (08/19 0700) Resp:  [16-22] 18 (08/19 0800) BP: (150-183)/(71-84) 150/72 (08/19 0800) SpO2:  [95 %-99 %] 98 % (08/19 0800) Weight:  [223 lb (101.2 kg)-224 lb 13.9 oz (102 kg)] 224 lb 13.9 oz (102 kg) (08/18 2107)  CBC:   Recent  Labs Lab 06/21/17 1520 06/22/17 0901  WBC 6.4 5.0  NEUTROABS 4.3 3.0  HGB 13.8 13.1  HCT 39.7 38.4*  MCV 85.6 84.2  PLT 280 235    Basic Metabolic Panel:   Recent Labs Lab 06/21/17 1520 06/22/17 0901  NA 137 136  K 3.9 3.6  CL 101 104  CO2 27 26  GLUCOSE 222* 168*  BUN 15 13  CREATININE 1.13 1.12  CALCIUM 8.7* 8.1*  MG  --  1.9  PHOS  --  3.3    Lipid Panel:     Component Value Date/Time   CHOL 168 06/22/2017 0347   TRIG 130 06/22/2017 0347   HDL 30 (L) 06/22/2017 0347   CHOLHDL 5.6 06/22/2017 0347   VLDL 26 06/22/2017 0347   LDLCALC 112 (H) 06/22/2017 0347   HgbA1c:  Lab Results  Component Value Date   HGBA1C 6.1 (H) 06/22/2017   Urine Drug Screen:     Component Value Date/Time   LABOPIA NONE DETECTED 10/20/2012 2106   COCAINSCRNUR NONE DETECTED 10/20/2012 2106   LABBENZ NONE DETECTED 10/20/2012 2106   AMPHETMU NONE DETECTED 10/20/2012 2106   THCU NONE DETECTED 10/20/2012 2106   LABBARB NONE DETECTED 10/20/2012 2106    Alcohol Level     Component Value Date/Time   [facility] <11 10/20/2012 2113    IMAGING I have personally reviewed the radiological images below and agree with the radiology interpretations.  Dg Chest 2 View 06/21/2017 Chronic basilar scarring or mild basilar atelectasis. No active disease otherwise.   Ct Head Wo Contrast 06/21/2017 Newly seen  low-density in the left caudate head and putamen on the left consistent with late acute / early subacute infarction. No hemorrhage.   MR Brain Wo Contrast  06/22/2017 1. Acute perforator infarct affecting the left striatum and internal capsule anterior limb. Small acute white matter infarct also in the MCA territory. 2. There are remote ischemic injuries along the left MCA watershed territory. Left M2 flow voids are less apparent, both new from 2017. Recommend CTA characterization, a proximal stenosis is suspected.  CTA H&N 06/22/2017 1. Occlusion or pre occlusive stenosis of the left M1  segment with downstream under filled branches. There are acute and chronic infarcts in the left MCA distribution seen on previous brain MRI, all new since 12/05/2015. 2. Atherosclerotic irregularity of medium size vessels, most prominent in the bilateral PCA distribution. P2 segment stenoses are moderate to advanced, greater on the right. 3. Minimal if any atheromatous changes in the neck. No embolic source identified.  LE venous Doppler pending  2-D Echo pending  TEE pending   PHYSICAL EXAM Vitals:   06/22/17 0056 06/22/17 0200 06/22/17 0400 06/22/17 0800  BP: (!) 168/78 (!) 165/74 (!) 150/71 (!) 150/72  Pulse: (!) 58   (!) 54  Resp:  16 16 18   Temp:  98 F (36.7 C) 98.1 F (36.7 C) 98 F (36.7 C)  TempSrc:  Axillary Axillary Axillary  SpO2:  98% 98% 98%  Weight:      Height:        Temp:  [97.7 F (36.5 C)-98.7 F (37.1 C)] 98.7 F (37.1 C) (08/19 1513) Pulse Rate:  [49-58] 50 (08/19 1513) Resp:  [16-20] 20 (08/19 1513) BP: (149-183)/(71-84) 149/71 (08/19 1513) SpO2:  [96 %-99 %] 96 % (08/19 1513) Weight:  [224 lb 13.9 oz (102 kg)] 224 lb 13.9 oz (102 kg) (08/18 2107)  General - Well nourished, well developed, in no apparent distress.  Ophthalmologic - Fundi not visualized due to noncooperation.  Cardiovascular - Regular rate and rhythm.  Mental Status -  Level of arousal and orientation to time, place, and person were intact. Language including expression, naming, repetition, comprehension was assessed and found intact. Mild psychomotor slowing. Fund of Knowledge was assessed and was intact.  Cranial Nerves II - XII - II - Visual field intact OU. III, IV, VI - Extraocular movements intact. V - Facial sensation intact bilaterally. VII - right nasolabial fold flattening. VIII - Hearing & vestibular intact bilaterally. X - Palate elevates symmetrically. XI - Chin turning & shoulder shrug intact bilaterally. XII - Tongue protrusion intact.  Motor Strength -  The patient's strength was normal in all extremities and pronator drift was absent.  Bulk was normal and fasciculations were absent.   Motor Tone - Muscle tone was assessed at the neck and appendages and was normal.  Reflexes - The patient's reflexes were 1+ in all extremities and he had no pathological reflexes.  Sensory - Light touch, temperature/pinprick were assessed and were symmetrical.    Coordination - The patient had normal movements in the hands with no ataxia or dysmetria.  Tremor was absent.  Gait and Station - deferred.   ASSESSMENT/PLAN [name] is a 66 y.o. male with history of obstructive sleep apnea, migraines, hypertension, diabetes mellitus, recent ear infection, recent gallbladder surgery, and recent pneumonia presenting with confusion and speech difficulties. He did not receive IV t-PA due to late presentation.  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 DDx may includes A. fib, PFO with DVT, or endocarditis due to recent infections.   Resultant  psychomotor slowing, right nasolabial fold flattening  CT head - acute infarct in the left caudate head and putamen on the left.  MRI head - Acute infarct affecting the left striatum and internal capsule anterior limb. Patchy left parietal cortical infarct.  CTA H&N - occlusion or pre-occlusive stenosis of the left M1 segment, suspicious for thrombus  LE venous doppler - pending  2D Echo - pending  Recommend TEE and recorder for further embolic workup to rule out endocarditis, PFO, or A. fib  LDL - 112  HgbA1c - 6.1  VTE prophylaxis - Lovenox Diet heart healthy/carb modified Room service appropriate? Yes; Fluid consistency: Thin Diet NPO time specified Except for: Sips with Meds  No antithrombotic prior to admission, now on aspirin 325 mg daily and plavix 75mg . Continue DAPT for 3 months and the plavix alone  due to intracranial atherosclerosis   Patient counseled to be compliant with his antithrombotic medications  Ongoing aggressive stroke risk factor management  Therapy recommendations: PT evaluation pending  Disposition: Pending  Left MCA occlusion  Concerning for embolic source  CTA neck no ICA stenosis or plaque   Further embolic work up pending  BP goal 130-150   Hypertension  Stable - on multiple antihypertensive medications.  Permissive hypertension (OK if < 220/120) but gradually normalize in 5-7 days  Long-term BP goal 130-150 due to left M1 occlusion  Hyperlipidemia  Home meds: No lipid lowering medications prior to admission.  LDL 112, goal < 70  Now on Lipitor 20 mg daily  Continue statin at discharge  Diabetes  HgbA1c 6.1, goal < 7.0  Controlled  SSI  CBG monitoring  Tobacco abuse  Current smoker  Smoking cessation counseling provided  Pt is willing to quit  Other Stroke Risk Factors  Advanced age  Obesity, Body mass index is 38.6 kg/m., recommend weight loss, diet and exercise as appropriate   Migraines  Obstructive sleep apnea  Other Active Problems  Chronic vertigo - following with ENT - on low dose Dilantin and propranolol    right hearing loss after ear infection - May consider ENT consultation    recent gallbladder surgery followed by pneumonia   Hospital day # 1  Marvel Plan, MD PhD Stroke Neurology 06/22/2017 5:26 PM    To contact Stroke Continuity provider, please refer to [email or web address]. After hours, contact General Neurology

## 2017-06-22 NOTE — Evaluation (Signed)
Occupational Therapy Evaluation Patient Details Name: Raymond [name] MRN: [id number] DOB: January 26, 1951 Today's Date: 06/22/2017    History of Present Illness Raymond [name] an 66 y.o.malewho has had multiple medical problems recently with recurrent admissions, including ear infection followed by cholecystectomy (8/7) and then pneumonia (8/11), who developed confusion and difficulty speaking on Saturday. He was initially seen at [facility] and was transferred to [facility] for further care. PMH significant for arthritis, depressioon, diabetes mellitus type 2 in obese, hypertension, migraines, and sleep apnea. MRI revealed acute perforator infarct affecting L striatum and internal capsule anterior limb as well as remote injuries in L MCA watershed territory.   Clinical Impression   Pt presents to OT alert and willing to participate in evaluation. Prior to admission, pt and family report independence with basic ADL but increasing weakness and difficulty with basic tasks. He currently requires overall supervision for ADL and ADL transfers. Pt does demonstrate difficulty alternating attention between tasks, generalized weakness, and decreased activity tolerance for ADL. Difficult to fully and accurately assess cognition due to impaired communication skills. Feel pt will progress well with OT services while admitted and do not anticipate need for OT follow-up. OT will continue to follow while admitted.    Follow Up Recommendations  No OT follow up;Supervision/Assistance - 24 hour    Equipment Recommendations  None recommended by OT    Recommendations for Other Services       Precautions / Restrictions Precautions Precautions: Fall Restrictions Weight Bearing Restrictions: No      Mobility Bed Mobility Overal bed mobility: Needs Assistance Bed Mobility: Supine to Sit;Sit to Supine     Supine to sit: Supervision Sit to supine: Supervision   General bed mobility comments: Supervision  for safety.   Transfers Overall transfer level: Needs assistance Equipment used: None Transfers: Sit to/from Stand Sit to Stand: Supervision         General transfer comment: Supervision for safety during sit<>stand.     Balance Overall balance assessment: Needs assistance Sitting-balance support: No upper extremity supported;Feet supported Sitting balance-Leahy Scale: Good     Standing balance support: Bilateral upper extremity supported;No upper extremity supported Standing balance-Leahy Scale: Fair                             ADL either performed or assessed with clinical judgement   ADL Overall ADL's : Needs assistance/impaired Eating/Feeding: Set up;Sitting   Grooming: Standing;Supervision/safety   Upper Body Bathing: Supervision/ safety;Sitting   Lower Body Bathing: Sit to/from stand;Supervison/ safety   Upper Body Dressing : Sitting;Supervision/safety   Lower Body Dressing: Sit to/from stand;Supervision/safety   Toilet Transfer: Ambulation;Supervision/safety   Toileting- Clothing Manipulation and Hygiene: Supervision/safety;Sit to/from stand       Functional mobility during ADLs: Supervision/safety General ADL Comments: Pt with slight instability.      Vision Baseline Vision/History: Wears glasses Wears Glasses: At all times Patient Visual Report: No change from baseline Vision Assessment?: No apparent visual deficits     Perception     Praxis      Pertinent Vitals/Pain Pain Assessment: No/denies pain     Hand Dominance     Extremity/Trunk Assessment Upper Extremity Assessment Upper Extremity Assessment: Generalized weakness   Lower Extremity Assessment Lower Extremity Assessment: Defer to PT evaluation       Communication     Cognition Arousal/Alertness: Awake/alert Behavior During Therapy: WFL for tasks assessed/performed Overall Cognitive Status: Difficult to assess  General Comments: Pt unable to complete higher level cognitive tasks while ambulating. Slow processing noted but able to answer all questions and demonstrate good ability to follow commands as well as reason well.    General Comments       Exercises     Shoulder Instructions      Home Living Family/patient expects to be discharged to:: Private residence Living Arrangements: Spouse/significant other;Children Available Help at Discharge: Family;Available 24 hours/day Type of Home: House Home Access: Stairs to enter [employer] of Steps: 4 Entrance Stairs-Rails:  (daughter reports they can install these) Home Layout: Two level;Bed/bath upstairs     Bathroom Shower/Tub: Producer, television/film/video: Standard     Home Equipment: Environmental consultant - 2 wheels;Cane - single point;Bedside commode          Prior Functioning/Environment Level of Independence: Independent                 OT Problem List: Decreased activity tolerance;Impaired balance (sitting and/or standing);Decreased cognition      OT Treatment/Interventions: Self-care/ADL training;Therapeutic exercise;Energy conservation;DME and/or AE instruction;Patient/family education;Balance training    OT Goals(Current goals can be found in the care plan section) Acute Rehab OT Goals Patient Stated Goal: get back to caring for his granddaughter OT Goal Formulation: With patient/family Time For Goal Achievement: 07/06/17 Potential to Achieve Goals: Good ADL Goals Pt Will Perform Grooming: Independently;standing Pt Will Transfer to Toilet: Independently;ambulating;regular height toilet Pt Will Perform Toileting - Clothing Manipulation and hygiene: Independently;sit to/from stand Pt Will Perform Tub/Shower Transfer: Shower transfer;Independently;3 in 1;ambulating Additional ADL Goal #1: Pt will complete pathfinding task in moderately distracting environment independently.   OT Frequency: Min 1X/week   Barriers  to D/C:            Co-evaluation              AM-PAC PT "6 Clicks" Daily Activity     Outcome Measure Help from another person eating meals?: None Help from another person taking care of personal grooming?: A Little Help from another person toileting, which includes using toliet, bedpan, or urinal?: A Little Help from another person bathing (including washing, rinsing, drying)?: A Little Help from another person to put on and taking off regular upper body clothing?: None Help from another person to put on and taking off regular lower body clothing?: A Little 6 Click Score: 20   End of Session Equipment Utilized During Treatment: Gait belt Nurse Communication: Mobility status  Activity Tolerance: Patient tolerated treatment well Patient left: in bed;with call bell/phone within reach;with family/visitor present  OT Visit Diagnosis: Unsteadiness on feet (R26.81)                Time: 1096-0454 OT Time Calculation (min): 17 min Charges:  OT General Charges $OT Visit: 1 Procedure OT Evaluation $OT Eval Moderate Complexity: 1 Procedure G-Codes:     Doristine Section, MS OTR/L  Pager: [phone]   Raymond [name] A Raymond [name] 06/22/2017, 1:04 PM

## 2017-06-22 NOTE — Consult Note (Signed)
Referring Physician: Dr. Tamala Julian    Chief Complaint: Difficulty speaking  HPI: Raymond [name] is an 66 y.o. male who has had multiple medical problems recently with recurrent admissions, including ear infection followed by gall bladder surgery and then pneumonia, who developed confusion and difficulty speaking on Saturday. He was initially seen at [facility] and was transferred to [facility] for further care. He has been more sleepy and confused. Speech deficit is described as having "a hard time getting my words out". Due to his recent ear infection, he has persisting decreased auditory acuity in his right ear. In triage, he had noticeable difficulty talking with the triage nurse. He has had "altered mental status" for "a few days" with no clearly defined time of onset.   Past Medical History:  Diagnosis Date  . Arthritis   . Depression   . Diabetes mellitus type 2 in obese ([place])   . Hypertension   . Migraines   . Sleep apnea     Past Surgical History:  Procedure Laterality Date  . CHOLECYSTECTOMY    . KNEE ARTHROSCOPY    . ROTATOR CUFF REPAIR      History reviewed. No pertinent family history. Social History:  reports that he has been smoking Cigars.  He has never used smokeless tobacco. He reports that he does not drink alcohol or use drugs.  Allergies:  Allergies  Allergen Reactions  . Flomax [Tamsulosin Hcl]     Effects bp    Home Medications:  Tylenol Benzonatate Atarax Augmentin Celexa Clindamycin Guaifenesin-codeine Hyzaar Glucophage Lopressor Naproxen Nortryptiline  ROS: As per HPI  Physical Examination: Blood pressure (!) 183/76, pulse (!) 58, temperature 98.4 F (36.9 C), temperature source Oral, resp. rate 20, height '5\' 4"'  (1.626 m), weight 102 kg (224 lb 13.9 oz), SpO2 99 %.  HEENT: [place]/AT Lungs: On CPAP.  Ext: No edema  Neurologic Examination: Mental Status: Awake and alert. Oriented to self, day, year, city and state but has difficulty recalling  the month. Some hesitancy with speech noted, but grammar and syntax are normal. Able to name fingers. Repetition intact. Difficulty with a 2-step directional command.  Cranial Nerves: II:  Visual fields intact, PERRL III,IV, VI: ptosis not present, EOMI without nystagmus V,VII: smile symmetric, facial temp sensation normal bilaterally VIII: hearing decreased on the right IX,X: no hypophonia XI: bilateral shoulder shrug symmetric XII: midline tongue extension  Motor: Right : Upper extremity   5/5    Left:     Upper extremity   5/5  Lower extremity   5/5     Lower extremity   5/5 Tone and bulk: Normal tone throughout; no atrophy noted Sensory: Temp and light touch intact x 4. No extinction Deep Tendon Reflexes:  2+ bilateral upper and lower extremity reflexes. Toes downgoing bilaterally.  Cerebellar: No ataxia with FNF bilaterally Gait: Deferred  Results for orders placed or performed during the hospital encounter of 06/21/17 (from the past 48 hour(s))  Urinalysis, Routine w reflex microscopic     Status: Abnormal   Collection Time: 06/21/17  3:06 PM  Result Value Ref Range   Color, Urine YELLOW YELLOW   APPearance CLEAR CLEAR   Specific Gravity, Urine 1.023 1.005 - 1.030   pH 6.5 5.0 - 8.0   Glucose, UA 250 (A) NEGATIVE mg/dL   Hgb urine dipstick NEGATIVE NEGATIVE   Bilirubin Urine NEGATIVE NEGATIVE   Ketones, ur NEGATIVE NEGATIVE mg/dL   Protein, ur NEGATIVE NEGATIVE mg/dL   Nitrite NEGATIVE NEGATIVE  Leukocytes, UA NEGATIVE NEGATIVE    Comment: Microscopic not done on urines with negative protein, blood, leukocytes, nitrite, or glucose < 500 mg/dL.  Comprehensive metabolic panel     Status: Abnormal   Collection Time: 06/21/17  3:20 PM  Result Value Ref Range   Sodium 137 135 - 145 mmol/L   Potassium 3.9 3.5 - 5.1 mmol/L   Chloride 101 101 - 111 mmol/L   CO2 27 22 - 32 mmol/L   Glucose, Bld 222 (H) 65 - 99 mg/dL   BUN 15 6 - 20 mg/dL   Creatinine, Ser 1.13 0.61 - 1.24  mg/dL   Calcium 8.7 (L) 8.9 - 10.3 mg/dL   Total Protein 6.4 (L) 6.5 - 8.1 g/dL   Albumin 3.4 (L) 3.5 - 5.0 g/dL   AST 25 15 - 41 U/L   ALT 28 17 - 63 U/L   Alkaline Phosphatase 33 (L) 38 - 126 U/L   Total Bilirubin 0.5 0.3 - 1.2 mg/dL   GFR calc non Af Amer >60 >60 mL/min   GFR calc Af Amer >60 >60 mL/min    Comment: (NOTE) The eGFR has been calculated using the CKD EPI equation. This calculation has not been validated in all clinical situations. eGFR's persistently <60 mL/min signify possible Chronic Kidney Disease.    Anion gap 9 5 - 15  Lipase, blood     Status: None   Collection Time: 06/21/17  3:20 PM  Result Value Ref Range   Lipase 42 11 - 51 U/L  CBC with Differential     Status: None   Collection Time: 06/21/17  3:20 PM  Result Value Ref Range   WBC 6.4 4.0 - 10.5 K/uL   RBC 4.64 4.22 - 5.81 MIL/uL   Hemoglobin 13.8 13.0 - 17.0 g/dL   HCT 39.7 39.0 - 52.0 %   MCV 85.6 78.0 - 100.0 fL   MCH 29.7 26.0 - 34.0 pg   MCHC 34.8 30.0 - 36.0 g/dL   RDW 12.5 11.5 - 15.5 %   Platelets 280 150 - 400 K/uL   Neutrophils Relative % 66 %   Neutro Abs 4.3 1.7 - 7.7 K/uL   Lymphocytes Relative 23 %   Lymphs Abs 1.5 0.7 - 4.0 K/uL   Monocytes Relative 7 %   Monocytes Absolute 0.5 0.1 - 1.0 K/uL   Eosinophils Relative 3 %   Eosinophils Absolute 0.2 0.0 - 0.7 K/uL   Basophils Relative 1 %   Basophils Absolute 0.0 0.0 - 0.1 K/uL  Troponin I     Status: None   Collection Time: 06/21/17  3:22 PM  Result Value Ref Range   Troponin I <0.03 <0.03 ng/mL  Glucose, capillary     Status: Abnormal   Collection Time: 06/21/17 10:55 PM  Result Value Ref Range   Glucose-Capillary 160 (H) 65 - 99 mg/dL   Comment 1 Notify RN    Comment 2 Document in Chart    Dg Chest 2 View  Result Date: 06/21/2017 CLINICAL DATA:  Presentation with recent stroke. EXAM: CHEST  2 VIEW COMPARISON:  06/14/2017 FINDINGS: Heart size is normal. Chronic aortic atherosclerosis. Chronic atelectasis or scarring at  the lung bases. No sign of active infiltrate, mass, effusion or collapse. IMPRESSION: Chronic basilar scarring or mild basilar atelectasis. No active disease otherwise. Electronically Signed   By: Nelson Chimes M.D.   On: 06/21/2017 15:59   Ct Head Wo Contrast  Result Date: 06/21/2017 CLINICAL DATA:  Focal neurological deficit.  Suspected stroke. Greater than 6 hours onset. EXAM: CT HEAD WITHOUT CONTRAST TECHNIQUE: Contiguous axial images were obtained from the base of the skull through the vertex without intravenous contrast. 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 Low-density but without hemorrhage or mass effect. Mild chronic small-vessel changes of the deep white matter. No mass lesion, hydrocephalus or extra-axial collection. Vascular: No abnormal vascular finding. Skull: Negative Sinuses/Orbits: Clear/normal Other: None IMPRESSION: Newly seen low-density in the left caudate head and putamen on the left consistent with late acute/ early subacute infarction. No hemorrhage. Electronically Signed   By: Nelson Chimes M.D.   On: 06/21/2017 15:58    Assessment: 66 y.o. male with new onset of expressive dysphasia versus confusion. Has had cognitive symptoms for approximately the past 4 days with no clearly delineated time of onset.  1.  CT head reveals a newly seen low-density in the left caudate head and putamen on the left consistent with late acute/ early subacute infarction.  2. Stroke Risk Factors - DM2, HTN  Plan: 1. HgbA1c, fasting lipid panel 2. MRI, MRA of the brain without contrast 3. PT consult, OT consult, Speech consult 4. Echocardiogram 5. Carotid dopplers 6. Agree with starting ASA  7. Risk factor modification 8. Telemetry monitoring 9. Frequent neuro checks 10. BP management 11. Start atorvastatin 40 mg po qd  '@Electronically'  signed: Dr. Kerney Elbe  06/22/2017, 12:02 AM

## 2017-06-22 NOTE — Progress Notes (Addendum)
PROGRESS NOTE    Raymond [name]  ZOX:[id number] DOB: [birth year] DOA: 06/21/2017 PCP: Forrest Moron, MD  Brief Narrative: Raymond [name] is a 66 y.o. male with medical history significant of HTN and DM type 2, sleep apnea on CPAP, and depression; who presents with difficulty expressing what he would like to say. The past 24 hours patient was reportedly more withdrawn and out of it per family members. They noticed that he would take long pauses and sometimes just say he couldn't get out what he would like to say which was unusual for him. Over the last 3 weeks patient has had a decline in his health. He was evaluated by his primary care doctor on July 31 for complaints of problems with his right ear and was prescribed Augmentin. At baseline patient has hearing loss on the left, but his right ear was given his reported good ear. The patient was taking antibiotics and became sick with nausea and vomiting for which his evaluated on 8/6, and found to have acute cholecystitis. He underwent laparoscopic cholecystectomy on 8/7. The following week family notes that he was diagnosed with pneumonia on 8/11 and has been on clindamycin since that time. Associated symptoms include decreased hearing out of the right ear. Patient will has been set up to see a ENT specialist sometime next week. He was worked up and found to have an Acute CVA that is currently being worked up for possible [employer].   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  -CT Head Scan showed Newly seen low-density in the left caudate head and putamen on the left consistent with late acute/ early subacute infarction.  No hemorrhage -CTA Head and Neck showed Occlusion or pre occlusive stenosis of the left M1 segment with downstream under filled branches. There are acute and chronic infarcts in the left MCA distribution seen on previous brain MRI, all new since 12/05/2015.  Atherosclerotic irregularity of medium size vessels, most prominent in the bilateral PCA distribution. P2 segment stenoses are moderate to advanced, greater on the right. Minimal if any atheromatous changes in the neck. No embolic source identified. 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 Small acute white matter infarct also in the MCA territory. There are remote ischemic injuries along the left MCA watershed territory. Left M2 flow voids are less apparent, both new from 2017.  -Neurology Consulted and think ?Cardioembolic CVA -Lipid Panel Showed Cholesterol of 168, HDL of 30, LDL of 112, TG of 130, VLDL of 26 -Lower Extremity Doppler Pending to r/o DVT -Transthoracic ECHO Pending; Neurology recommending Trans-Esophageal ECHO to r/o Endocarditis/PFO/Atrial Fibrillation -Hemoglobin A1c was 6.1 -C/w 325 mg of ASA daily and 75 mg Plavix daily for 3 Months and then Just Plavix due due Intracranial Atheroschlerosis -Neurology was consulted and recommended completion of stroke workup. -C/w Neurochecks per Protocol -PT/OT recommend no Follow up  -Troponin was <0.03 -C/w Atorvastatin 20 mg po qHS -Continue CVA workup and Appreciate Stroke Team Input for further management and Recc's  Hearing loss right ear, suspected otitis media:  -Patient reports previously being diagnosed with otitis media. Appears did not complete  course of Augmentin.  -Question if related to possible stroke. Patient has been treated with Augmentin and clindamycin for community-acquired pneumonia. - Clean out Ears, reevaluate in a.m. - Follow-up with ENT as outpatient    Essential Hypertension -Allow for  Permissive HTN -On Losartan HCTZ 100/25 mg po Daily, Propanolol 20 mg po BID, and Verpamil 120 mg po Daily at home. Hold Losartan-HCTZ to allow for Permissive HTN; C/w Propanolol and Verapamil  -Continue to Monitor BP's; Last BP was 149/71  Community-Acquired Pneumonia:  - Recently diagnosed on an patient reported being just about to complete his antibiotic course. Chest x-ray clear. - Consider need of continuation of antibiotics  Inner ear dysfunction/Chronic Vertigo - Continue Propranolol 20 mg po BID and Phenytoin 100 mg po BID - Has appointment with ENT this week   Depression - Continue Home Celexa   Diabetes mellitus type 2:  - Patient reports only being on oral medications of Metformin at home. - Hypoglycemic protocols - Hold metformin - Follow-up hemoglobin A1c was 6.1 - CBGs every before meals and at bedtime with sensitive Novolog SSI   OSA on CPAP - CPAP at night  DVT prophylaxis: Enoxaparin 40 mg sq q24h Code Status: FULL CODE Family Communication: Discussed with Family at bedside Disposition Plan: Remain inpatient to complete Embolic CVA workup  Consultants:   Neurology/Stroke Team   Procedures: ECHOCARDIOGRAM AND LE DOPPLERS PENDING   Antimicrobials: Anti-infectives    Start     Dose/Rate Route Frequency Ordered Stop   06/22/17 0015  clindamycin (CLEOCIN) capsule 300 mg  Status:  Discontinued     300 mg Oral Once 06/22/17 0000 06/22/17 0009   06/22/17 0000  clindamycin (CLEOCIN) capsule 300 mg  Status:  Discontinued     300 mg Oral 3 times daily 06/21/17 2353 06/22/17 0000   06/22/17 0000  amoxicillin-clavulanate (AUGMENTIN) 875-125 MG per tablet 1 tablet  Status:  Discontinued     1 tablet Oral Every 12 hours 06/21/17 2353 06/22/17 0009     Subjective: Seen and examined at bedside and was complaining of losing acuity in Right ear. No nausea or vomiting. Walking well without PT help.   Objective: Vitals:   06/22/17 0400 06/22/17 0800 06/22/17 1513  06/22/17 1901  BP: (!) 150/71 (!) 150/72 (!) 149/71 (!) (P) 142/62  Pulse:  (!) 54 (!) 50 (!) (P) 50  Resp: 16 18 20  (P) 20  Temp: 98.1 F (36.7 C) 98 F (36.7 C) 98.7 F (37.1 C) (P) 98.2 F (36.8 C)  TempSrc: Axillary Axillary Oral (P) Oral  SpO2: 98% 98% 96% (P) 96%  Weight:      Height:        Intake/Output Summary (Last 24 hours) at 06/22/17 2013 Last data filed at 06/22/17 1519  Gross per 24 hour  Intake             1200 ml  Output                0 ml  Net             1200 ml   Filed Weights   06/21/17 1448 06/21/17 2107  Weight: 101.2 kg (223 lb) 102 kg (224 lb 13.9 oz)   Examination: Physical Exam:  Constitutional: WN/WD obese Caucasian male in NAD and appears calm and comfortable Eyes:  Lids and conjunctivae normal, sclerae anicteric  ENMT: External Ears, Nose appear normal. Hard of hearing in Left and a little in Right. Mucous membranes are moist. Neck:  Appears normal, supple, no cervical masses, normal ROM, no appreciable thyromegaly, no JVD Respiratory: Diminished to auscultation bilaterally, no wheezing, rales, rhonchi or crackles. Normal respiratory effort and patient is not tachypenic. No accessory muscle use.  Cardiovascular: RRR, no murmurs / rubs / gallops. S1 and S2 auscultated. Mild 1+ extremity edema. 2+ pedal pulses. Abdomen: Soft, non-tender, non-distended. No masses palpated. No appreciable hepatosplenomegaly. Bowel sounds positive.  GU: Deferred. Musculoskeletal: No clubbing / cyanosis of digits/nails. No joint deformity upper and lower extremities. Good ROM, no contractures.  Skin: No rashes, lesions, ulcers on a limited skin eval. No induration; Warm and dry.  Neurologic: CN 2-12 grossly intact with no focal deficits. Sensation intact in all 4 Extremities. Romberg sign cerebellar reflexes not assessed.  Psychiatric: Normal judgment and insight. Alert and oriented x 3. Normal mood and appropriate affect.   Data Reviewed: I have personally reviewed  following labs and imaging studies  CBC:  Recent Labs Lab 06/21/17 1520 06/22/17 0901  WBC 6.4 5.0  NEUTROABS 4.3 3.0  HGB 13.8 13.1  HCT 39.7 38.4*  MCV 85.6 84.2  PLT 280 235   Basic Metabolic Panel:  Recent Labs Lab 06/21/17 1520 06/22/17 0901  NA 137 136  K 3.9 3.6  CL 101 104  CO2 27 26  GLUCOSE 222* 168*  BUN 15 13  CREATININE 1.13 1.12  CALCIUM 8.7* 8.1*  MG  --  1.9  PHOS  --  3.3   GFR: Estimated Creatinine Clearance: 69.1 mL/min (by C-G formula based on SCr of 1.12 mg/dL). Liver Function Tests:  Recent Labs Lab 06/21/17 1520 06/22/17 0901  AST 25 22  ALT 28 24  ALKPHOS 33* 27*  BILITOT 0.5 0.5  PROT 6.4* 5.4*  ALBUMIN 3.4* 2.9*    Recent Labs Lab 06/21/17 1520  LIPASE 42   No results for input(s): AMMONIA in the last 168 hours. Coagulation Profile: No results for input(s): INR, PROTIME in the last 168 hours. Cardiac Enzymes:  Recent Labs Lab 06/21/17 1522  TROPONINI <0.03   BNP (last 3 results) No results for input(s): PROBNP in the last 8760 hours. HbA1C:  Recent Labs  06/22/17 0347  HGBA1C 6.1*   CBG:  Recent Labs Lab 06/21/17 2255 06/22/17 0617 06/22/17 1202 06/22/17 1632  GLUCAP 160* 102* 133* 113*   Lipid Profile:  Recent Labs  06/22/17 0347  CHOL 168  HDL 30*  LDLCALC 112*  TRIG 130  CHOLHDL 5.6   Thyroid Function Tests: No results for input(s): TSH, T4TOTAL, FREET4, T3FREE, THYROIDAB in the last 72 hours. Anemia Panel: No results for input(s): VITAMINB12, FOLATE, FERRITIN, TIBC, IRON, RETICCTPCT in the last 72 hours. Sepsis Labs: No results for input(s): PROCALCITON, LATICACIDVEN in the last 168 hours.  No results found for this or any previous visit (from the past 240 hour(s)).   Radiology Studies: Ct Angio Head W Or Wo Contrast  Result Date: 06/22/2017 CLINICAL DATA:  Stroke follow-up. EXAM: CT ANGIOGRAPHY HEAD AND NECK TECHNIQUE: Multidetector CT imaging of the head and neck was performed  using the standard protocol during bolus administration of intravenous contrast. Multiplanar CT image reconstructions and MIPs were obtained to evaluate the vascular anatomy. Carotid stenosis measurements (when applicable) are obtained utilizing NASCET criteria, using the distal internal carotid diameter as the denominator. CONTRAST:  50 cc Isovue 370 intravenous COMPARISON:  Brain MRI 12/04/2015 FINDINGS: CTA NECK FINDINGS Aortic arch: Probable atheromatous thickening of the wall diffusely. No acute finding. Three vessel branching. Right carotid system: Questionable non  atheromatous wall thickening. No stenosis, dissection, or ulceration. Mild ICA tortuosity. Left carotid system: Questionable non atheromatous wall thickening. No stenosis or ulceration. Vertebral arteries: Left dominant. Vertebral arteries are smooth and widely patent to the dura. No proximal subclavian stenosis. Skeleton: No acute or aggressive finding. Other neck: No incidental mass or adenopathy. Upper chest: No acute finding Review of the MIP images confirms the above findings CTA HEAD FINDINGS Anterior circulation: Symmetric ICA. Hypoplastic left A1 segment with small but present anterior communicating artery. There is short segment occlusion or critical stenosis of the left M1 segment. On axial slices, initially there is concern for of luminal filling defect, but on reformats this appears more eccentric. Given the acute and chronic MCA watershed infarcts on previous brain MRI there is some chronicity to this stenosis. Left MCA branches are under filled compared to the right. There is atherosclerotic irregularity of bilateral medium sized ACA and MCA vessels. Posterior circulation: Left vertebral artery dominance. There is moderate atherosclerotic irregularity of bilateral PCA vessels, with moderate to advanced bilateral P2 segment stenoses, more advanced on the right. Mild narrowing of the left P1 segment. Venous sinuses: Patent Anatomic  variants: None significant Delayed phase: No abnormal intracranial enhancement. A call has been placed to the ordering provider. Review of the MIP images confirms the above findings IMPRESSION: 1. 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 There are acute and chronic infarcts in the left MCA distribution seen on previous brain MRI, all new since 12/05/2015. 2. Atherosclerotic irregularity of medium size vessels, most prominent in the bilateral PCA distribution. P2 segment stenoses are moderate to advanced, greater on the right. 3. Minimal if any atheromatous changes in the neck. No embolic source identified. Electronically Signed   By: Marnee Spring M.D.   On: 06/22/2017 11:19   Dg Chest 2 View  Result Date: 06/21/2017 CLINICAL DATA:  Presentation with recent stroke. EXAM: CHEST  2 VIEW COMPARISON:  06/14/2017 FINDINGS: Heart size is normal. Chronic aortic atherosclerosis. Chronic atelectasis or scarring at the lung bases. No sign of active infiltrate, mass, effusion or collapse. IMPRESSION: Chronic basilar scarring or mild basilar atelectasis. No active disease otherwise. Electronically Signed   By: Paulina Fusi M.D.   On: 06/21/2017 15:59   Ct Head Wo Contrast  Result Date: 06/21/2017 CLINICAL DATA:  Focal neurological deficit. Suspected stroke. Greater than 6 hours onset. EXAM: CT HEAD WITHOUT CONTRAST TECHNIQUE: Contiguous axial images were obtained from the base of the skull through the vertex without intravenous contrast. 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 Low-density but without hemorrhage or mass effect. Mild chronic small-vessel changes of the deep white matter. No mass lesion, hydrocephalus or extra-axial collection. Vascular: No abnormal vascular finding. Skull: Negative Sinuses/Orbits: Clear/normal Other: None IMPRESSION: Newly seen  low-density in the left caudate head and putamen on the left consistent with late acute/ early subacute infarction. No hemorrhage. Electronically Signed   By: Paulina Fusi M.D.   On: 06/21/2017 15:58   Ct Angio Neck W Or Wo Contrast  Result Date: 06/22/2017 CLINICAL DATA:  Stroke follow-up. EXAM: CT ANGIOGRAPHY HEAD AND NECK TECHNIQUE: Multidetector CT imaging of the head and neck was performed using the standard protocol during bolus administration of intravenous contrast. Multiplanar CT image reconstructions and MIPs were obtained to evaluate the vascular anatomy. Carotid stenosis measurements (when applicable) are obtained utilizing NASCET criteria, using the  distal internal carotid diameter as the denominator. CONTRAST:  50 cc Isovue 370 intravenous COMPARISON:  Brain MRI 12/04/2015 FINDINGS: CTA NECK FINDINGS Aortic arch: Probable atheromatous thickening of the wall diffusely. No acute finding. Three vessel branching. Right carotid system: Questionable non atheromatous wall thickening. No stenosis, dissection, or ulceration. Mild ICA tortuosity. Left carotid system: Questionable non atheromatous wall thickening. No stenosis or ulceration. Vertebral arteries: Left dominant. Vertebral arteries are smooth and widely patent to the dura. No proximal subclavian stenosis. Skeleton: No acute or aggressive finding. Other neck: No incidental mass or adenopathy. Upper chest: No acute finding Review of the MIP images confirms the above findings CTA HEAD FINDINGS Anterior circulation: Symmetric ICA. Hypoplastic left A1 segment with small but present anterior communicating artery. There is short segment occlusion or critical stenosis of the left M1 segment. On axial slices, initially there is concern for of luminal filling defect, but on reformats this appears more eccentric. Given the acute and chronic MCA watershed infarcts on previous brain MRI there is some chronicity to this stenosis. Left MCA branches are under  filled compared to the right. There is atherosclerotic irregularity of bilateral medium sized ACA and MCA vessels. Posterior circulation: Left vertebral artery dominance. There is moderate atherosclerotic irregularity of bilateral PCA vessels, with moderate to advanced bilateral P2 segment stenoses, more advanced on the right. Mild narrowing of the left P1 segment. Venous sinuses: Patent Anatomic variants: None significant Delayed phase: No abnormal intracranial enhancement. A call has been placed to the ordering provider. Review of the MIP images confirms the above findings IMPRESSION: 1. 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 There are acute and chronic infarcts in the left MCA distribution seen on previous brain MRI, all new since 12/05/2015. 2. Atherosclerotic irregularity of medium size vessels, most prominent in the bilateral PCA distribution. P2 segment stenoses are moderate to advanced, greater on the right. 3. Minimal if any atheromatous changes in the neck. No embolic source identified. Electronically Signed   By: Marnee Spring M.D.   On: 06/22/2017 11:19   Mr Brain Wo Contrast  Result Date: 06/22/2017 CLINICAL DATA:  Ischemic stroke workup. EXAM: MRI HEAD WITHOUT CONTRAST TECHNIQUE: Multiplanar, multiecho pulse sequences of the brain and surrounding structures were obtained without intravenous contrast. COMPARISON:  12/05/2015 FINDINGS: Brain: Perforator infarct on the left affecting the striatum and internal capsule anterior limb on the left. Small left temporoparietal cortex and white matter infarct. There is asymmetric chronic white matter ischemic type changes with lacunes in the left centrum semiovale. Hemosiderin staining along the cortex of the left cerebral hemisphere, roughly in the watershed regions. A coronal T2 weighted acquisition was not obtained. Vascular: Major flow voids are preserved. Less prominent left M2 flow voids.  Skull and upper cervical spine: Negative for marrow lesion. Sinuses/Orbits: No acute finding. Mild partial opacification of mastoid air cells. Negative nasopharynx. IMPRESSION: 1. Acute perforator infarct affecting the left striatum and internal capsule anterior limb. Small acute white matter infarct also in the MCA territory. 2. There are remote ischemic injuries along the left MCA watershed territory. Left M2 flow voids are less apparent, both new from 2017. Recommend CTA characterization, a proximal stenosis is suspected. Electronically Signed   By: Marnee Spring M.D.   On: 06/22/2017 10:54   Scheduled Meds: .  stroke: mapping our early stages of recovery book   Does not apply Once  . aspirin  324 mg Oral Daily  . atorvastatin  20  mg Oral q1800  . clopidogrel  75 mg Oral Daily  . enoxaparin (LOVENOX) injection  40 mg Subcutaneous Q24H  . losartan  100 mg Oral Daily   And  . hydrochlorothiazide  25 mg Oral Daily  . insulin aspart  0-5 Units Subcutaneous QHS  . insulin aspart  0-9 Units Subcutaneous TID WC  . iopamidol      . phenytoin  100 mg Oral BID  . propranolol  20 mg Oral BID  . verapamil  120 mg Oral Daily   Continuous Infusions:   LOS: 1 day   Merlene Laughter, DO [facility] Pager [phone]  If 7PM-7AM, please contact night-coverage [email or web address] Password [email or web address] 06/22/2017, 8:13 PM

## 2017-06-22 NOTE — Progress Notes (Signed)
PT Cancellation Note  Patient Details Name: Raymond [name] MRN: [id number] DOB: 07-19-1951   Cancelled Treatment:    Reason Eval/Treat Not Completed: Patient at procedure or test/unavailable. Pt currently off the floor for MRI and CTA. Will check back as time allows.    Colin Broach PT, DPT  [phone]  06/22/2017, 10:46 AM

## 2017-06-23 ENCOUNTER — Inpatient Hospital Stay (HOSPITAL_COMMUNITY): Payer: Medicare Other

## 2017-06-23 ENCOUNTER — Encounter (HOSPITAL_COMMUNITY): Payer: Self-pay | Admitting: Cardiology

## 2017-06-23 DIAGNOSIS — I6300 Cerebral infarction due to thrombosis of unspecified precerebral artery: Secondary | ICD-10-CM

## 2017-06-23 DIAGNOSIS — I5032 Chronic diastolic (congestive) heart failure: Secondary | ICD-10-CM

## 2017-06-23 DIAGNOSIS — I639 Cerebral infarction, unspecified: Secondary | ICD-10-CM

## 2017-06-23 LAB — CBC WITH DIFFERENTIAL/PLATELET
Basophils Absolute: 0 10*3/uL (ref 0.0–0.1)
Basophils Relative: 0 %
Eosinophils Absolute: 0.2 10*3/uL (ref 0.0–0.7)
Eosinophils Relative: 4 %
HCT: 40.3 % (ref 39.0–52.0)
Hemoglobin: 14.2 g/dL (ref 13.0–17.0)
LYMPHS ABS: 1.9 10*3/uL (ref 0.7–4.0)
LYMPHS PCT: 31 %
MCH: 29.3 pg (ref 26.0–34.0)
MCHC: 35.2 g/dL (ref 30.0–36.0)
MCV: 83.3 fL (ref 78.0–100.0)
MONO ABS: 0.6 10*3/uL (ref 0.1–1.0)
Monocytes Relative: 9 %
Neutro Abs: 3.5 10*3/uL (ref 1.7–7.7)
Neutrophils Relative %: 56 %
PLATELETS: 271 10*3/uL (ref 150–400)
RBC: 4.84 MIL/uL (ref 4.22–5.81)
RDW: 12.6 % (ref 11.5–15.5)
WBC: 6.2 10*3/uL (ref 4.0–10.5)

## 2017-06-23 LAB — COMPREHENSIVE METABOLIC PANEL
ALT: 23 U/L (ref 17–63)
AST: 19 U/L (ref 15–41)
Albumin: 3.3 g/dL — ABNORMAL LOW (ref 3.5–5.0)
Alkaline Phosphatase: 33 U/L — ABNORMAL LOW (ref 38–126)
Anion gap: 7 (ref 5–15)
BUN: 11 mg/dL (ref 6–20)
CALCIUM: 8.7 mg/dL — AB (ref 8.9–10.3)
CHLORIDE: 104 mmol/L (ref 101–111)
CO2: 28 mmol/L (ref 22–32)
CREATININE: 1.09 mg/dL (ref 0.61–1.24)
Glucose, Bld: 107 mg/dL — ABNORMAL HIGH (ref 65–99)
Potassium: 3.5 mmol/L (ref 3.5–5.1)
Sodium: 139 mmol/L (ref 135–145)
TOTAL PROTEIN: 6.1 g/dL — AB (ref 6.5–8.1)
Total Bilirubin: 0.7 mg/dL (ref 0.3–1.2)

## 2017-06-23 LAB — GLUCOSE, CAPILLARY
GLUCOSE-CAPILLARY: 137 mg/dL — AB (ref 65–99)
GLUCOSE-CAPILLARY: 203 mg/dL — AB (ref 65–99)
GLUCOSE-CAPILLARY: 164 mg/dL — AB (ref 65–99)
Glucose-Capillary: 122 mg/dL — ABNORMAL HIGH (ref 65–99)

## 2017-06-23 LAB — URINE CULTURE
Culture: NO GROWTH
SPECIAL REQUESTS: NORMAL

## 2017-06-23 LAB — PHOSPHORUS: PHOSPHORUS: 3.5 mg/dL (ref 2.5–4.6)

## 2017-06-23 LAB — ECHOCARDIOGRAM COMPLETE
Height: 64 in
WEIGHTICAEL: 3597.91 [oz_av]

## 2017-06-23 LAB — MAGNESIUM: MAGNESIUM: 2.1 mg/dL (ref 1.7–2.4)

## 2017-06-23 MED ORDER — SODIUM CHLORIDE 0.9 % IV SOLN
INTRAVENOUS | Status: DC
  Administered 2017-06-24: 500 mL via INTRAVENOUS

## 2017-06-23 NOTE — Evaluation (Signed)
Speech Language Pathology Evaluation Patient Details Name: Raymond [name] MRN: [id number] DOB: 1951/08/18 Today's Date: 06/23/2017 Time: 1250-1309 SLP Time Calculation (min) (ACUTE ONLY): 19 min  Problem List:  Patient Active Problem List   Diagnosis Date Noted  . Depression 06/22/2017  . Diabetes mellitus type 2 in obese (HCC) 06/22/2017  . Inner ear dysfunction 06/22/2017  . Essential hypertension 06/22/2017  . OSA on CPAP 06/22/2017  . Expressive aphasia 06/22/2017  . Stroke (HCC) 06/21/2017  . CVA (cerebral vascular accident) (HCC) 06/21/2017  . Major depressive disorder, recurrent (HCC) 10/21/2012   Past Medical History:  Past Medical History:  Diagnosis Date  . Arthritis   . Depression   . Diabetes mellitus type 2 in obese (HCC)   . Hypertension   . Migraines   . Sleep apnea    Past Surgical History:  Past Surgical History:  Procedure Laterality Date  . CHOLECYSTECTOMY    . KNEE ARTHROSCOPY    . ROTATOR CUFF REPAIR     HPI:  Raymond Emard Allenis a 66 y.o.malewith medical history significant of HTN and DM type 2, sleep apnea on CPAP, and depression; who presents withdifficulty expressing what he would like to say. The past 24 hours patient was reportedly more withdrawn and out of it per family members. They noticed that he would take long pauses and sometimes just say he couldn't get out what he would like to say which was unusual for him. Over the last 3weeks patient has had a decline in his health. He was evaluated by his primary care doctor on July 31 for complaints of problems with his right ear and was prescribed Augmentin. At baseline patient has hearing loss on the left, but his right ear was given his reported good ear. The patient was antibiotics and became sick with nausea and vomiting for which his evaluated on 8/6, and found to have acute cholecystitis. He underwent laparoscopic cholecystectomy on 8/7. The following week family notes that he was diagnosed with  pneumonia on 8/11 and has been on clindamycin since that time. Associated symptoms include decreased hearing out of the right ear. Patient will has been set up to see a ENT specialist sometime next week. Upon admission into the emergency department patient was seen to be afebrile, pulse 49-58, respirations 17-22, blood pressure is elevated to 183/76, and O2 saturations 95-90% on room air. Labs revealed a blood glucose of 222. CT scan of the brain showing acute to subacute infarct on the left caudate head and putamen. Patient transferred to a telemetry bed at [facility] for completion of stroke workup.   Assessment / Plan / Recommendation Clinical Impression  Pt presents with mild expressive communication deficits c/b fluent expressive abilities but nonspecific descriptions d/t word finding deficits. Pt with circumlocutions. He was able to communicate his anxiety about multiple medical issues. Pt's wife states that pt's speech is much better with each day. Would recommend follow-up HHST if pt's expressive abilities are at baseline.     SLP Assessment  SLP Recommendation/Assessment: Patient needs continued Speech Lanaguage Pathology Services SLP Visit Diagnosis: Cognitive communication deficit (R41.841)    Follow Up Recommendations  Home health SLP (If pt isn't improved and at baseline)    Frequency and Duration min 1 x/week  1 week      SLP Evaluation Cognition  Overall Cognitive Status: Within Functional Limits for tasks assessed Arousal/Alertness: Awake/alert Orientation Level: Oriented X4       Comprehension  Auditory Comprehension Overall Auditory Comprehension: Appears  within functional limits for tasks assessed Visual Recognition/Discrimination Discrimination: Within Function Limits Reading Comprehension Reading Status: Within funtional limits    Expression Expression Primary Mode of Expression: Verbal Verbal Expression Overall Verbal Expression: Impaired Initiation: No  impairment Automatic Speech: Name;Social Response Level of Generative/Spontaneous Verbalization: Conversation Repetition: No impairment Naming: Impairment Responsive: 51-75% accurate Verbal Errors: Aware of errors Pragmatics: No impairment Non-Verbal Means of Communication: Not applicable Written Expression Dominant Hand: Right Written Expression: Not tested   Oral / Motor  Oral Motor/Sensory Function Overall Oral Motor/Sensory Function: Generalized oral weakness (Right) Motor Speech Overall Motor Speech: Appears within functional limits for tasks assessed Respiration: Within functional limits Phonation: Normal Resonance: Within functional limits Articulation: Within functional limitis Intelligibility: Intelligible Motor Planning: Witnin functional limits Motor Speech Errors: Not applicable   GO                    Amarilys Lyles 06/23/2017, 1:23 PM

## 2017-06-23 NOTE — Progress Notes (Signed)
**  Preliminary report by tech**  Bilateral lower extremity venous duplex completed. There is no evidence of deep or superficial vein thrombosis involving the right and left lower extremities. All visualized vessels appear patent and compressible. There is no evidence of Baker's cysts bilaterally.  06/23/17 4:17 PM Olen Cordial RVT

## 2017-06-23 NOTE — Progress Notes (Signed)
STROKE TEAM PROGRESS NOTE   SUBJECTIVE (INTERVAL HISTORY) No family at the bedside.  Pt is awake, alert, and follows all commands appropriately.  TEE and loop recorder insertion tomorrow.  Slight saccadic dysmetria noted in left eye while moving gaze rightward.   OBJECTIVE Temp:  [98 F (36.7 C)-99.5 F (37.5 C)] 98 F (36.7 C) (08/20 0954) Pulse Rate:  [44-68] 53 (08/20 0954) Cardiac Rhythm: Sinus bradycardia (08/20 0700) Resp:  [16-20] 16 (08/20 0954) BP: (142-172)/(62-89) 158/77 (08/20 0954) SpO2:  [95 %-98 %] 96 % (08/20 0954)  CBC:   Recent Labs Lab 06/22/17 0901 06/23/17 0332  WBC 5.0 6.2  NEUTROABS 3.0 3.5  HGB 13.1 14.2  HCT 38.4* 40.3  MCV 84.2 83.3  PLT 235 271    Basic Metabolic Panel:   Recent Labs Lab 06/22/17 0901 06/23/17 0332  NA 136 139  K 3.6 3.5  CL 104 104  CO2 26 28  GLUCOSE 168* 107*  BUN 13 11  CREATININE 1.12 1.09  CALCIUM 8.1* 8.7*  MG 1.9 2.1  PHOS 3.3 3.5    Lipid Panel:     Component Value Date/Time   CHOL 168 06/22/2017 0347   TRIG 130 06/22/2017 0347   HDL 30 (L) 06/22/2017 0347   CHOLHDL 5.6 06/22/2017 0347   VLDL 26 06/22/2017 0347   LDLCALC 112 (H) 06/22/2017 0347   HgbA1c:  Lab Results  Component Value Date   HGBA1C 6.1 (H) 06/22/2017   Urine Drug Screen:     Component Value Date/Time   LABOPIA NONE DETECTED 06/22/2017 2323   COCAINSCRNUR NONE DETECTED 06/22/2017 2323   LABBENZ NONE DETECTED 06/22/2017 2323   AMPHETMU NONE DETECTED 06/22/2017 2323   THCU NONE DETECTED 06/22/2017 2323   LABBARB NONE DETECTED 06/22/2017 2323    Alcohol Level     Component Value Date/Time   [facility] <11 10/20/2012 2113    IMAGING  Dg Chest 2 View 06/21/2017 Chronic basilar scarring or mild basilar atelectasis. No active disease otherwise.   Ct Head Wo Contrast 06/21/2017 Newly seen low-density in the left caudate head and putamen on the left consistent with late acute / early subacute infarction. No hemorrhage.   MR  Brain Wo Contrast  06/22/2017 1. Acute perforator infarct affecting the left striatum and internal capsule anterior limb. Small acute white matter infarct also in the MCA territory. 2. There are remote ischemic injuries along the left MCA watershed territory. Left M2 flow voids are less apparent, both new from 2017. Recommend CTA characterization, a proximal stenosis is suspected.  CTA H&N 06/22/2017 1. 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 There are acute and chronic infarcts in the left MCA distribution seen on previous brain MRI, all new since 12/05/2015. 2. Atherosclerotic irregularity of medium size vessels, most prominent in the bilateral PCA distribution. P2 segment stenoses are moderate to advanced, greater on the right. 3. Minimal if any atheromatous changes in the neck. No embolic source identified.  LE venous Doppler 06/23/2017 There is no evidence of deep or superficial vein thrombosis involving the right and left lower extremities. All visualized vessels appear patent and compressible. There is no evidence of Baker's cysts bilaterally.  2-D Echo 06/23/2017 Study Conclusions - Left ventricle: The cavity size was normal. Wall thickness was   increased in a pattern of mild LVH. Systolic function was normal.  The estimated ejection fraction was in the range of 60% to 65%.  Wall motion was normal; there  were no regional wall motion abnormalities. Doppler parameters are consistent with abnormal left ventricular relaxation (grade 1 diastolic dysfunction). - Mitral valve: Calcified annulus. Impressions: - Normal LV systolic function; mild diastolic dysfunction; mild LVH.  TEE  06/24/2017 pending   PHYSICAL EXAM Vitals:   06/22/17 2207 06/23/17 0039 06/23/17 0452 06/23/17 0954  BP:  (!) 169/77 (!) 172/74 (!) 158/77  Pulse: 68 (!) 44 (!) 44 (!) 53  Resp:  20 20 16   Temp:  98.4 F (36.9 C) 98.7 F (37.1 C) 98 F (36.7  C)  TempSrc:  Axillary Oral Oral  SpO2:  97% 97% 96%  Weight:      Height:        Temp:  [98 F (36.7 C)-99.5 F (37.5 C)] 98 F (36.7 C) (08/20 0954) Pulse Rate:  [44-68] 53 (08/20 0954) Resp:  [16-20] 16 (08/20 0954) BP: (142-172)/(62-89) 158/77 (08/20 0954) SpO2:  [95 %-98 %] 96 % (08/20 0954)  General - Well nourished, well developed, in no apparent distress.  Ophthalmologic - Fundi not visualized due to noncooperation.  Cardiovascular - Regular rate and rhythm.  Mental Status -  Level of arousal and orientation to time, place, and person were intact. Language including expression, naming, repetition, comprehension was assessed and found intact. Mild psychomotor slowing. Fund of Knowledge was assessed and was intact.  Cranial Nerves II - XII - II - Visual field intact OU. III, IV, VI - Extraocular movements intact. V - Facial sensation intact bilaterally. VII - right nasolabial fold flattening. VIII - Hearing & vestibular intact bilaterally. X - Palate elevates symmetrically. XI - Chin turning & shoulder shrug intact bilaterally. XII - Tongue protrusion intact.  Motor Strength - The patient's strength was normal in all extremities and pronator drift was absent.  Bulk was normal and fasciculations were absent.   Motor Tone - Muscle tone was assessed at the neck and appendages and was normal.  Reflexes - The patient's reflexes were 1+ in all extremities and he had no pathological reflexes.  Sensory - Light touch, temperature/pinprick were assessed and were symmetrical.    Coordination - The patient had normal movements in the hands with no ataxia or dysmetria.  Tremor was absent.  Gait and Station - deferred.   ASSESSMENT/PLAN [name] is a 66 y.o. male with history of obstructive sleep apnea, migraines, hypertension, diabetes mellitus, recent ear infection, recent gallbladder surgery, and recent pneumonia presenting with confusion and speech  difficulties. He did not receive IV t-PA due to late presentation.  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 DDx may includes A. fib, PFO with DVT, or endocarditis due to recent infections.   Resultant  psychomotor slowing, right nasolabial fold flattening  CT head - acute infarct in the left caudate head and putamen on the left.  MRI head - Acute infarct affecting the left striatum and internal capsule anterior limb. Patchy left parietal cortical infarct.  CTA H&N - occlusion or pre-occlusive stenosis of the left M1 segment, suspicious for thrombus  LE venous doppler - No DVT  2D Echo - EF 60-65%. No source of embolus    Recommend TEE and recorder for further embolic workup to rule out endocarditis, PFO, or A. fib  LDL - 112  HgbA1c - 6.1  VTE prophylaxis - Lovenox Diet heart healthy/carb modified Room service appropriate? Yes; Fluid consistency: Thin Diet NPO time specified  No antithrombotic prior to admission, now on aspirin 325 mg daily and plavix 75mg . Continue DAPT for 3 months and the plavix alone due to intracranial atherosclerosis   Patient counseled to be compliant with his antithrombotic medications  Ongoing aggressive stroke risk factor management  Therapy recommendations: PT evaluation pending  Disposition: Pending  Left MCA occlusion  Concerning for embolic source  CTA neck no ICA stenosis or plaque   Further embolic work up pending  BP goal 130-150   Hypertension  Stable - on multiple antihypertensive medications.  Permissive hypertension (OK if < 220/120) but gradually normalize in 5-7 days  Long-term BP goal 130-150 due to left M1 occlusion  Hyperlipidemia  Home meds: No lipid lowering medications prior to admission.  LDL 112, goal < 70  Now on Lipitor 20 mg daily  Continue statin at discharge  Diabetes  HgbA1c 6.1, goal <  7.0  Controlled  SSI  CBG monitoring  Tobacco abuse  Current smoker  Smoking cessation counseling provided  Pt is willing to quit  Other Stroke Risk Factors  Advanced age  Obesity, Body mass index is 38.6 kg/m., recommend weight loss, diet and exercise as appropriate   Migraines  Obstructive sleep apnea  Other Active Problems  Chronic vertigo - following with ENT - on low dose Dilantin and propranolol    right hearing loss after ear infection - May consider ENT consultation    recent gallbladder surgery followed by pneumonia   Hospital day # 2  Plan await TEE and  loop recorder tomorrow. Discussed with Dr. Junie Panning. 06/23/2017 4:28 PM    To contact Stroke Continuity provider, please refer to [email or web address]. After hours, contact General Neurology

## 2017-06-23 NOTE — Progress Notes (Signed)
  Echocardiogram 2D Echocardiogram has been performed.  Arvil Chaco 06/23/2017, 10:31 AM

## 2017-06-23 NOTE — Progress Notes (Signed)
    [facility] has been requested to perform a transesophageal echocardiogram on Raymond [name] for stroke.  After careful review of history and examination, the risks and benefits of transesophageal echocardiogram have been explained including risks of esophageal damage, perforation (1:10,000 risk), bleeding, pharyngeal hematoma as well as other potential complications associated with conscious sedation including aspiration, arrhythmia, respiratory failure and death. Alternatives to treatment were discussed, questions were answered. Patient is willing to proceed. Discussed above with pt and his wife.    Nada Boozer, NP  06/23/2017 4:41 PM

## 2017-06-23 NOTE — Progress Notes (Signed)
Occupational Therapy Treatment Patient Details Name: Raymond [name] MRN: [id number] DOB: 1951-03-30 Today's Date: 06/23/2017    History of present illness Lori Liew Allenis an 66 y.o.malewho has had multiple medical problems recently with recurrent admissions, including ear infection followed by cholecystectomy (8/7) and then pneumonia (8/11), who developed confusion and difficulty speaking on Saturday. He was initially seen at [facility] and was transferred to [facility] for further care. PMH significant for arthritis, depression, diabetes mellitus type 2 in obese, hypertension, migraines, and sleep apnea. MRI revealed acute perforator infarct affecting L striatum and internal capsule anterior limb as well as remote injuries in L MCA watershed territory.   OT comments  Pt progressing well toward OT goals. He was able to demonstrate improving processing speed this session but continues to require increased time. Pt able to complete pathfinding task in hallway with 1 VC to maximize planning aspect of executive functioning task. Discussed with family who also report improvement. OT will continue to follow while admitted.    Follow Up Recommendations  No OT follow up;Supervision/Assistance - 24 hour    Equipment Recommendations  None recommended by OT    Recommendations for Other Services      Precautions / Restrictions Precautions Precautions: Fall Restrictions Weight Bearing Restrictions: No       Mobility Bed Mobility Overal bed mobility: Needs Assistance Bed Mobility: Sit to Supine     Supine to sit: Supervision Sit to supine: Supervision   General bed mobility comments: Supervision for safety. Pt requiring multiple attempts to raise trunk from bed.   Transfers Overall transfer level: Modified independent Equipment used: None Transfers: Sit to/from Stand Sit to Stand: Independent              Balance Overall balance assessment: Needs assistance Sitting-balance  support: No upper extremity supported;Feet supported Sitting balance-Leahy Scale: Good     Standing balance support: No upper extremity supported;During functional activity Standing balance-Leahy Scale: Good                             ADL either performed or assessed with clinical judgement   ADL Overall ADL's : Needs assistance/impaired     Grooming: Modified independent;Standing                   Toilet Transfer: Modified Independent;Ambulation           Functional mobility during ADLs: Modified independent General ADL Comments: Demonstrates improved stability with functional mobility.      Vision   Vision Assessment?: No apparent visual deficits   Perception     Praxis      Cognition Arousal/Alertness: Awake/alert Behavior During Therapy: WFL for tasks assessed/performed Overall Cognitive Status: Within Functional Limits for tasks assessed                                 General Comments: Pt able to alternate attention to perform cognitive tasks while ambulating. He continues to require increased time to process and respond. With pathfinding tasks in hallway, pt required 1 VC for executive functioning utilizing map on wall. Pt will initially answer "I don't know" quickly  but after a moment of processing, is able to complete delayed recall tasks.         Exercises     Shoulder Instructions       General Comments educated on BEFAST signs of  stroke, encouraged continued mobility    Pertinent Vitals/ Pain       Pain Assessment: No/denies pain  Home Living                                          Prior Functioning/Environment Level of Independence: Independent            Frequency  Min 1X/week        Progress Toward Goals  OT Goals(current goals can now be found in the care plan section)  Progress towards OT goals: Progressing toward goals  Acute Rehab OT Goals Patient Stated Goal: get back  to caring for his granddaughter OT Goal Formulation: With patient/family Time For Goal Achievement: 07/06/17 Potential to Achieve Goals: Good  Plan Discharge plan remains appropriate    Co-evaluation                 AM-PAC PT "6 Clicks" Daily Activity     Outcome Measure   Help from another person eating meals?: None Help from another person taking care of personal grooming?: A Little Help from another person toileting, which includes using toliet, bedpan, or urinal?: A Little Help from another person bathing (including washing, rinsing, drying)?: A Little Help from another person to put on and taking off regular upper body clothing?: None Help from another person to put on and taking off regular lower body clothing?: A Little 6 Click Score: 20    End of Session Equipment Utilized During Treatment: Gait belt  OT Visit Diagnosis: Unsteadiness on feet (R26.81)   Activity Tolerance Patient tolerated treatment well   Patient Left  (ambulating in hallway with PT)   Nurse Communication Mobility status        Time: 8372-9021 OT Time Calculation (min): 22 min  Charges: OT General Charges $OT Visit: 1 Procedure OT Treatments $Self Care/Home Management : 8-22 mins  Doristine Section, MS OTR/L  Pager: [phone]    Reaghan Kawa A Melissia Lahman 06/23/2017, 10:40 AM

## 2017-06-23 NOTE — Progress Notes (Signed)
PROGRESS NOTE    Raymond [name]  ZOX:[id number] DOB: Mar 22, 1951 DOA: 06/21/2017 PCP: Forrest Moron, MD  Brief Narrative: Raymond [name] is a 66 y.o. male with medical history significant of HTN and DM type 2, sleep apnea on CPAP, and depression; who presents with difficulty expressing what he would like to say. The past 24 hours patient was reportedly more withdrawn and out of it per family members. They noticed that he would take long pauses and sometimes just say he couldn't get out what he would like to say which was unusual for him. Over the last 3 weeks patient has had a decline in his health. He was evaluated by his primary care doctor on July 31 for complaints of problems with his right ear and was prescribed Augmentin. At baseline patient has hearing loss on the left, but his right ear was given his reported good ear. The patient was taking antibiotics and became sick with nausea and vomiting for which his evaluated on 8/6, and found to have acute cholecystitis. He underwent laparoscopic cholecystectomy on 8/7. The following week family notes that he was diagnosed with pneumonia on 8/11 and has been on clindamycin since that time. Associated symptoms include decreased hearing out of the right ear. Patient will has been set up to see a ENT specialist sometime next week. He was worked up and found to have an Acute CVA that is currently being worked up for possible [employer]. 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  -CT Head Scan showed Newly seen low-density in the left caudate head  and putamen on the left consistent with late acute/ early subacute infarction. No hemorrhage -CTA Head and Neck showed Occlusion or pre occlusive stenosis of the left M1 segment with downstream under filled branches. There are acute and chronic infarcts in the left MCA distribution seen on previous brain MRI, all new since 12/05/2015.  Atherosclerotic irregularity of medium size vessels, most prominent in the bilateral PCA distribution. P2 segment stenoses are moderate to advanced, greater on the right. Minimal if any atheromatous changes in the neck. No embolic source identified. 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 Small acute white matter infarct also in the MCA territory. There are remote ischemic injuries along the left MCA watershed territory. Left M2 flow voids are less apparent, both new from 2017.  -Neurology Consulted and think ?Cardioembolic CVA -Lipid Panel Showed Cholesterol of 168, HDL of 30, LDL of 112, TG of 130, VLDL of 26 -Lower Extremity Doppler showed There is no evidence of deep or superficial vein thrombosis involving the right and left lower extremities. All visualized vessels appear patent and compressible. There is no evidence of Baker's cysts bilaterally -Transthoracic ECHO showed ejection fraction was in the range of 60% to 65%. Wall motion was normal; there were no regional wall motion abnormalities. Doppler parameters are consistent with abnormal left ventricular relaxation (grade 1 diastolic dysfunction). -Neurology recommending Trans-Esophageal ECHO to r/o Endocarditis/PFO/Atrial Fibrillation and will be done in AM; Patient to be NPO  after midnight  -Hemoglobin A1c was 6.1 -C/w 325 mg of ASA daily and 75 mg Plavix daily for 3 Months and then Just Plavix due due Intracranial Atheroschlerosis -Neurology was consulted and recommended completion of stroke workup. -C/w Neurochecks per Protocol -PT/OT  recommend no Follow up  -Troponin was <0.03 -C/w Atorvastatin 20 mg po qHS -Continue CVA workup and Appreciate Stroke Team Input for further management and Recc's  Hearing loss right ear, suspected otitis media:  -Patient reports previously being diagnosed with otitis media. Appears did not complete course of Augmentin.  -Question if related to possible stroke. Patient has been treated with Augmentin and clindamycin for community-acquired pneumonia. - Clean out Ears, reevaluate in a.m. - Follow-up with ENT as outpatient    Essential Hypertension -Allow for Permissive HTN -On Losartan HCTZ 100/25 mg po Daily, Propanolol 20 mg po BID, and Verpamil 120 mg po Daily at home. Hold Losartan-HCTZ to allow for Permissive HTN; C/w Propanolol and Verapamil  -Continue to Monitor BP's; Last BP was 149/71 -Long Term BP Goal to be 130-150 Systolically due to M1 Occlusion   Community-Acquired Pneumonia:  -Recently diagnosed on an patient reported being just about to complete his antibiotic course. Chest x-ray clear. -Consider need of continuation of antibiotics  Inner ear dysfunction/Chronic Vertigo -Continue Propranolol 20 mg po BID and Phenytoin 100 mg po BID - Has appointment with ENT this week   Depression - Continue Home Celexa   Diabetes mellitus type 2:  - Patient reports only being on oral medications of Metformin at home. - Hypoglycemic protocols - Hold Metformin - Follow-up hemoglobin A1c was 6.1 - CBGs every before meals and at bedtime with sensitive Novolog SSI  - CBG's ranging from 122-203  OSA on CPAP - CPAP at night  DVT prophylaxis: Enoxaparin 40 mg sq q24h Code Status: FULL CODE Family Communication: Discussed with Family at bedside Disposition Plan: Remain inpatient to complete Embolic CVA workup  Consultants:   Neurology/Stroke Team  Cardiology to Do TEE   Procedures:  ECHOCARDIOGRAM  Study Conclusions  - Left ventricle: The cavity size was normal.  Wall thickness was   increased in a pattern of mild LVH. Systolic function was normal.   The estimated ejection fraction was in the range of 60% to 65%.   Wall motion was normal; there were no regional wall motion   abnormalities. Doppler parameters are consistent with abnormal   left ventricular relaxation (grade 1 diastolic dysfunction). - Mitral valve: Calcified annulus.  Impressions:  - Normal LV systolic function; mild diastolic dysfunction; mild   LVH.  LOWER EXTREMITY DOPPLERS There is no evidence of deep or superficial vein thrombosis involving the right and left lower extremities. All visualized vessels appear patent and compressible. There is no evidence of Baker's cysts bilaterally.  TEE WITH LOOP RECORDER INSERTION Pending   Antimicrobials: Anti-infectives    Start     Dose/Rate Route Frequency Ordered Stop   06/22/17 0015  clindamycin (CLEOCIN) capsule 300 mg  Status:  Discontinued     300 mg Oral Once 06/22/17 0000 06/22/17 0009   06/22/17 0000  clindamycin (CLEOCIN) capsule 300 mg  Status:  Discontinued     300 mg Oral 3 times daily 06/21/17 2353 06/22/17 0000   06/22/17 0000  amoxicillin-clavulanate (AUGMENTIN) 875-125 MG per tablet 1 tablet  Status:  Discontinued     1 tablet Oral Every 12 hours 06/21/17 2353 06/22/17 0009     Subjective: Seen and examined at bedside and states ear is better  but still not the same. Felt a little hungry. No nausea or vomiting. No other concerns or complaints.   Objective: Vitals:   06/23/17 0039 06/23/17 0452 06/23/17 0954 06/23/17 1825  BP: (!) 169/77 (!) 172/74 (!) 158/77 (!) 153/79  Pulse: (!) 44 (!) 44 (!) 53 (!) 49  Resp: 20 20 16 18   Temp: 98.4 F (36.9 C) 98.7 F (37.1 C) 98 F (36.7 C) 98.2 F (36.8 C)  TempSrc: Axillary Oral Oral Oral  SpO2: 97% 97% 96% 98%  Weight:      Height:        Intake/Output Summary (Last 24 hours) at 06/23/17 1847 Last data filed at 06/23/17 1000  Gross per 24 hour  Intake                 0 ml  Output              600 ml  Net             -600 ml   Filed Weights   06/21/17 1448 06/21/17 2107  Weight: 101.2 kg (223 lb) 102 kg (224 lb 13.9 oz)   Examination: Physical Exam:  Constitutional: WN/WD obese Caucasian male in NAD appears comfortable  Eyes: Sclerae anicteric; Conjunctivae non-injected ENMT: Hard of hearing in left. External ears appear normal. Mucous membranes moist Neck: Supple with no JVD Respiratory: Diminished to auscultation bilaterally; No appreciable wheezing/rales/rhonchi Cardiovascular: RRR; Trace edema Abdomen:  Soft, NT, ND. Bowel sounds present GU: Deferred Musculoskeletal: No contractures; no cyanosis Skin: Warm and dry; No rashes or lesions on a limited skin eval Neurologic: CN 2-12 grossly intact. Sensation appeared intact Psychiatric: Normal judgement and insight. A and O x 3. Normal mood but has flat appearing affect  Data Reviewed: I have personally reviewed following labs and imaging studies  CBC:  Recent Labs Lab 06/21/17 1520 06/22/17 0901 06/23/17 0332  WBC 6.4 5.0 6.2  NEUTROABS 4.3 3.0 3.5  HGB 13.8 13.1 14.2  HCT 39.7 38.4* 40.3  MCV 85.6 84.2 83.3  PLT 280 235 271   Basic Metabolic Panel:  Recent Labs Lab 06/21/17 1520 06/22/17 0901 06/23/17 0332  NA 137 136 139  K 3.9 3.6 3.5  CL 101 104 104  CO2 27 26 28   GLUCOSE 222* 168* 107*  BUN 15 13 11   CREATININE 1.13 1.12 1.09  CALCIUM 8.7* 8.1* 8.7*  MG  --  1.9 2.1  PHOS  --  3.3 3.5   GFR: Estimated Creatinine Clearance: 71 mL/min (by C-G formula based on SCr of 1.09 mg/dL). Liver Function Tests:  Recent Labs Lab 06/21/17 1520 06/22/17 0901 06/23/17 0332  AST 25 22 19   ALT 28 24 23   ALKPHOS 33* 27* 33*  BILITOT 0.5 0.5 0.7  PROT 6.4* 5.4* 6.1*  ALBUMIN 3.4* 2.9* 3.3*    Recent Labs Lab 06/21/17 1520  LIPASE 42   No results for input(s): AMMONIA in the last 168 hours. Coagulation Profile: No results for input(s): INR, PROTIME in the  last 168 hours. Cardiac Enzymes:  Recent Labs Lab 06/21/17 1522  TROPONINI <0.03   BNP (last 3 results) No results for input(s): PROBNP in the last 8760 hours. HbA1C:  Recent Labs  06/22/17 0347  HGBA1C 6.1*   CBG:  Recent Labs Lab 06/22/17 1632 06/22/17 2114 06/23/17 0606 06/23/17 1210 06/23/17 1636  GLUCAP 113* 125* 122* 137* 203*   Lipid Profile:  Recent Labs  06/22/17 0347  CHOL 168  HDL 30*  LDLCALC  112*  TRIG 130  CHOLHDL 5.6   Thyroid Function Tests: No results for input(s): TSH, T4TOTAL, FREET4, T3FREE, THYROIDAB in the last 72 hours. Anemia Panel: No results for input(s): VITAMINB12, FOLATE, FERRITIN, TIBC, IRON, RETICCTPCT in the last 72 hours. Sepsis Labs: No results for input(s): PROCALCITON, LATICACIDVEN in the last 168 hours.  Recent Results (from the past 240 hour(s))  Urine culture     Status: None   Collection Time: 06/21/17  3:06 PM  Result Value Ref Range Status   Specimen Description URINE, CLEAN CATCH  Final   Special Requests Normal  Final   Culture   Final    NO GROWTH Performed at [facility], [address], [place], [place] [address]    Report Status 06/23/2017 FINAL  Final  Culture, blood (routine x 2)     Status: None (Preliminary result)   Collection Time: 06/21/17  3:15 PM  Result Value Ref Range Status   Specimen Description BLOOD LEFT ANTECUBITAL  Final   Special Requests   Final    Blood Culture adequate volume BOTTLES DRAWN AEROBIC AND ANAEROBIC   Culture   Final    NO GROWTH 1 DAY Performed at [facility], [address], [place], [place] [address]    Report Status PENDING  Incomplete  Culture, blood (routine x 2)     Status: None (Preliminary result)   Collection Time: 06/21/17  4:10 PM  Result Value Ref Range Status   Specimen Description BLOOD RIGHT ANTECUBITAL  Final   Special Requests Blood Culture adequate volume IN PEDIATRIC BOTTLE  Final   Culture   Final    NO GROWTH 1 DAY Performed at  [facility], [address], [place], [place] [address]    Report Status PENDING  Incomplete     Radiology Studies: Ct Angio Head W Or Wo Contrast  Result Date: 06/22/2017 CLINICAL DATA:  Stroke follow-up. EXAM: CT ANGIOGRAPHY HEAD AND NECK TECHNIQUE: Multidetector CT imaging of the head and neck was performed using the standard protocol during bolus administration of intravenous contrast. Multiplanar CT image reconstructions and MIPs were obtained to evaluate the vascular anatomy. Carotid stenosis measurements (when applicable) are obtained utilizing NASCET criteria, using the distal internal carotid diameter as the denominator. CONTRAST:  50 cc Isovue 370 intravenous COMPARISON:  Brain MRI 12/04/2015 FINDINGS: CTA NECK FINDINGS Aortic arch: Probable atheromatous thickening of the wall diffusely. No acute finding. Three vessel branching. Right carotid system: Questionable non atheromatous wall thickening. No stenosis, dissection, or ulceration. Mild ICA tortuosity. Left carotid system: Questionable non atheromatous wall thickening. No stenosis or ulceration. Vertebral arteries: Left dominant. Vertebral arteries are smooth and widely patent to the dura. No proximal subclavian stenosis. Skeleton: No acute or aggressive finding. Other neck: No incidental mass or adenopathy. Upper chest: No acute finding Review of the MIP images confirms the above findings CTA HEAD FINDINGS Anterior circulation: Symmetric ICA. Hypoplastic left A1 segment with small but present anterior communicating artery. There is short segment occlusion or critical stenosis of the left M1 segment. On axial slices, initially there is concern for of luminal filling defect, but on reformats this appears more eccentric. Given the acute and chronic MCA watershed infarcts on previous brain MRI there is some chronicity to this stenosis. Left MCA branches are under filled compared to the right. There is atherosclerotic irregularity of  bilateral medium sized ACA and MCA vessels. Posterior circulation: Left vertebral artery dominance. There is moderate atherosclerotic irregularity of  bilateral PCA vessels, with moderate to advanced bilateral P2 segment stenoses, more advanced on the right. Mild narrowing of the left P1 segment. Venous sinuses: Patent Anatomic variants: None significant Delayed phase: No abnormal intracranial enhancement. A call has been placed to the ordering provider. Review of the MIP images confirms the above findings IMPRESSION: 1. 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 There are acute and chronic infarcts in the left MCA distribution seen on previous brain MRI, all new since 12/05/2015. 2. Atherosclerotic irregularity of medium size vessels, most prominent in the bilateral PCA distribution. P2 segment stenoses are moderate to advanced, greater on the right. 3. Minimal if any atheromatous changes in the neck. No embolic source identified. Electronically Signed   By: Marnee Spring M.D.   On: 06/22/2017 11:19   Ct Angio Neck W Or Wo Contrast  Result Date: 06/22/2017 CLINICAL DATA:  Stroke follow-up. EXAM: CT ANGIOGRAPHY HEAD AND NECK TECHNIQUE: Multidetector CT imaging of the head and neck was performed using the standard protocol during bolus administration of intravenous contrast. Multiplanar CT image reconstructions and MIPs were obtained to evaluate the vascular anatomy. Carotid stenosis measurements (when applicable) are obtained utilizing NASCET criteria, using the distal internal carotid diameter as the denominator. CONTRAST:  50 cc Isovue 370 intravenous COMPARISON:  Brain MRI 12/04/2015 FINDINGS: CTA NECK FINDINGS Aortic arch: Probable atheromatous thickening of the wall diffusely. No acute finding. Three vessel branching. Right carotid system: Questionable non atheromatous wall thickening. No stenosis, dissection, or ulceration. Mild ICA tortuosity. Left  carotid system: Questionable non atheromatous wall thickening. No stenosis or ulceration. Vertebral arteries: Left dominant. Vertebral arteries are smooth and widely patent to the dura. No proximal subclavian stenosis. Skeleton: No acute or aggressive finding. Other neck: No incidental mass or adenopathy. Upper chest: No acute finding Review of the MIP images confirms the above findings CTA HEAD FINDINGS Anterior circulation: Symmetric ICA. Hypoplastic left A1 segment with small but present anterior communicating artery. There is short segment occlusion or critical stenosis of the left M1 segment. On axial slices, initially there is concern for of luminal filling defect, but on reformats this appears more eccentric. Given the acute and chronic MCA watershed infarcts on previous brain MRI there is some chronicity to this stenosis. Left MCA branches are under filled compared to the right. There is atherosclerotic irregularity of bilateral medium sized ACA and MCA vessels. Posterior circulation: Left vertebral artery dominance. There is moderate atherosclerotic irregularity of bilateral PCA vessels, with moderate to advanced bilateral P2 segment stenoses, more advanced on the right. Mild narrowing of the left P1 segment. Venous sinuses: Patent Anatomic variants: None significant Delayed phase: No abnormal intracranial enhancement. A call has been placed to the ordering provider. Review of the MIP images confirms the above findings IMPRESSION: 1. 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 There are acute and chronic infarcts in the left MCA distribution seen on previous brain MRI, all new since 12/05/2015. 2. Atherosclerotic irregularity of medium size vessels, most prominent in the bilateral PCA distribution. P2 segment stenoses are moderate to advanced, greater on the right. 3. Minimal if any atheromatous changes in the neck. No embolic source identified.  Electronically Signed   By: Marnee Spring M.D.   On: 06/22/2017 11:19   Mr Brain Wo Contrast  Result Date: 06/22/2017 CLINICAL DATA:  Ischemic stroke workup. EXAM: MRI HEAD WITHOUT CONTRAST TECHNIQUE: Multiplanar, multiecho pulse sequences of the  brain and surrounding structures were obtained without intravenous contrast. COMPARISON:  12/05/2015 FINDINGS: Brain: Perforator infarct on the left affecting the striatum and internal capsule anterior limb on the left. Small left temporoparietal cortex and white matter infarct. There is asymmetric chronic white matter ischemic type changes with lacunes in the left centrum semiovale. Hemosiderin staining along the cortex of the left cerebral hemisphere, roughly in the watershed regions. A coronal T2 weighted acquisition was not obtained. Vascular: Major flow voids are preserved. Less prominent left M2 flow voids. Skull and upper cervical spine: Negative for marrow lesion. Sinuses/Orbits: No acute finding. Mild partial opacification of mastoid air cells. Negative nasopharynx. IMPRESSION: 1. Acute perforator infarct affecting the left striatum and internal capsule anterior limb. Small acute white matter infarct also in the MCA territory. 2. There are remote ischemic injuries along the left MCA watershed territory. Left M2 flow voids are less apparent, both new from 2017. Recommend CTA characterization, a proximal stenosis is suspected. Electronically Signed   By: Marnee Spring M.D.   On: 06/22/2017 10:54   Scheduled Meds: .  stroke: mapping our early stages of recovery book   Does not apply Once  . aspirin  324 mg Oral Daily  . atorvastatin  20 mg Oral q1800  . clopidogrel  75 mg Oral Daily  . enoxaparin (LOVENOX) injection  40 mg Subcutaneous Q24H  . insulin aspart  0-5 Units Subcutaneous QHS  . insulin aspart  0-9 Units Subcutaneous TID WC  . phenytoin  100 mg Oral BID  . propranolol  20 mg Oral BID  . verapamil  120 mg Oral Daily   Continuous  Infusions: . [START ON 06/24/2017] sodium chloride      LOS: 2 days   Merlene Laughter, DO [facility] Pager [phone]  If 7PM-7AM, please contact night-coverage [email or web address] Password [facility] 06/23/2017, 6:47 PM

## 2017-06-23 NOTE — Progress Notes (Signed)
Physical Therapy Treatment Patient Details Name: Raymond [name] MRN: [id number] DOB: Apr 22, 1951 Today's Date: 06/23/2017    History of Present Illness Raymond [name] an 66 y.o.malewho has had multiple medical problems recently with recurrent admissions, including ear infection followed by cholecystectomy (8/7) and then pneumonia (8/11), who developed confusion and difficulty speaking on Saturday. He was initially seen at [facility] and was transferred to [facility] for further care. PMH significant for arthritis, depression, diabetes mellitus type 2 in obese, hypertension, migraines, and sleep apnea. MRI revealed acute perforator infarct affecting L striatum and internal capsule anterior limb as well as remote injuries in L MCA watershed territory.    PT Comments    Patient seen for mobility progression, mobilizing well. All acute PT goals met at this time, will sign off.    Follow Up Recommendations  No PT follow up     Equipment Recommendations  None recommended by PT    Recommendations for Other Services       Precautions / Restrictions Precautions Precautions: Fall Restrictions Weight Bearing Restrictions: No    Mobility  Bed Mobility               General bed mobility comments: received up with OT  Transfers Overall transfer level: Independent Equipment used: None Transfers: Sit to/from Stand Sit to Stand: Independent            Ambulation/Gait Ambulation/Gait assistance: Independent Ambulation Distance (Feet): 800 Feet Assistive device: None Gait Pattern/deviations: WFL(Within Functional Limits);Decreased dorsiflexion - right     General Gait Details: modest deficits with RLE clearance on occasion but overall no over LOB or instability noted   Stairs Stairs: Yes   Stair Management: One rail Right Number of Stairs: 12 General stair comments: patient had no difficulty performing stair negotation, utilized step to method for safety, increased  time to perform with rail. Steady with performance  Wheelchair Mobility    Modified Rankin (Stroke Patients Only)       Balance Overall balance assessment: Needs assistance Sitting-balance support: No upper extremity supported;Feet supported Sitting balance-Leahy Scale: Good     Standing balance support: No upper extremity supported;During functional activity Standing balance-Leahy Scale: Good                              Cognition Arousal/Alertness: Awake/alert Behavior During Therapy: WFL for tasks assessed/performed Overall Cognitive Status: Within Functional Limits for tasks assessed                                 General Comments: functional compared to previous sessions, some slow processing but overall able to perform tasks and commands consistently      Exercises      General Comments General comments (skin integrity, edema, etc.): educated on BEFAST signs of stroke, encouraged continued mobility      Pertinent Vitals/Pain Pain Assessment: No/denies pain    Home Living                      Prior Function Level of Independence: Independent          PT Goals (current goals can now be found in the care plan section) Acute Rehab PT Goals Patient Stated Goal: get back to caring for his granddaughter PT Goal Formulation: With patient Time For Goal Achievement: 06/29/17 Potential to Achieve Goals: Good Progress towards PT  goals: Goals met/education completed, patient discharged from PT    Frequency    Min 4X/week      PT Plan Discharge plan needs to be updated (goals met/ discharge PT)    Co-evaluation              AM-PAC PT "6 Clicks" Daily Activity  Outcome Measure  Difficulty turning over in bed (including adjusting bedclothes, sheets and blankets)?: None Difficulty moving from lying on back to sitting on the side of the bed? : None Difficulty sitting down on and standing up from a chair with arms  (e.g., wheelchair, bedside commode, etc,.)?: None Help needed moving to and from a bed to chair (including a wheelchair)?: None Help needed walking in hospital room?: A Little Help needed climbing 3-5 steps with a railing? : A Little 6 Click Score: 22    End of Session Equipment Utilized During Treatment: Gait belt Activity Tolerance: Patient tolerated treatment well Patient left: in bed;with call bell/phone within reach;Other (comment);with family/visitor present (sitting EOB) Nurse Communication: Mobility status PT Visit Diagnosis: Muscle weakness (generalized) (M62.81);Difficulty in walking, not elsewhere classified (R26.2)     Time: 1791-5056 PT Time Calculation (min) (ACUTE ONLY): 17 min  Charges:  $Gait Training: 8-22 mins                    G Codes:       Alben Deeds, PT DPT  Board Certified Neurologic Specialist [place] 06/23/2017, 10:25 AM

## 2017-06-24 ENCOUNTER — Encounter (HOSPITAL_COMMUNITY)
Admission: EM | Disposition: A | Discharge status: Home / Self Care | Payer: Self-pay | Source: Home / Self Care | Attending: Internal Medicine

## 2017-06-24 ENCOUNTER — Inpatient Hospital Stay (HOSPITAL_COMMUNITY): Payer: Medicare Other

## 2017-06-24 ENCOUNTER — Encounter (HOSPITAL_COMMUNITY): Payer: Self-pay | Admitting: Physician Assistant

## 2017-06-24 DIAGNOSIS — I638 Other cerebral infarction: Secondary | ICD-10-CM

## 2017-06-24 DIAGNOSIS — I340 Nonrheumatic mitral (valve) insufficiency: Secondary | ICD-10-CM

## 2017-06-24 HISTORY — PX: TEE WITHOUT CARDIOVERSION: SHX5443

## 2017-06-24 HISTORY — PX: LOOP RECORDER INSERTION: EP1214

## 2017-06-24 LAB — CBC WITH DIFFERENTIAL/PLATELET
BASOS ABS: 0 10*3/uL (ref 0.0–0.1)
BASOS PCT: 0 %
EOS PCT: 3 %
Eosinophils Absolute: 0.2 10*3/uL (ref 0.0–0.7)
HEMATOCRIT: 41.7 % (ref 39.0–52.0)
Hemoglobin: 14.4 g/dL (ref 13.0–17.0)
LYMPHS PCT: 25 %
Lymphs Abs: 1.7 10*3/uL (ref 0.7–4.0)
MCH: 28.8 pg (ref 26.0–34.0)
MCHC: 34.5 g/dL (ref 30.0–36.0)
MCV: 83.4 fL (ref 78.0–100.0)
MONO ABS: 0.6 10*3/uL (ref 0.1–1.0)
Monocytes Relative: 9 %
NEUTROS ABS: 4.2 10*3/uL (ref 1.7–7.7)
Neutrophils Relative %: 63 %
Platelets: 262 10*3/uL (ref 150–400)
RBC: 5 MIL/uL (ref 4.22–5.81)
RDW: 12.6 % (ref 11.5–15.5)
WBC: 6.8 10*3/uL (ref 4.0–10.5)

## 2017-06-24 LAB — GLUCOSE, CAPILLARY
GLUCOSE-CAPILLARY: 133 mg/dL — AB (ref 65–99)
Glucose-Capillary: 109 mg/dL — ABNORMAL HIGH (ref 65–99)

## 2017-06-24 LAB — COMPREHENSIVE METABOLIC PANEL
ALBUMIN: 3.3 g/dL — AB (ref 3.5–5.0)
ALK PHOS: 33 U/L — AB (ref 38–126)
ALT: 25 U/L (ref 17–63)
ANION GAP: 8 (ref 5–15)
AST: 23 U/L (ref 15–41)
BILIRUBIN TOTAL: 0.8 mg/dL (ref 0.3–1.2)
BUN: 14 mg/dL (ref 6–20)
CALCIUM: 8.9 mg/dL (ref 8.9–10.3)
CO2: 27 mmol/L (ref 22–32)
Chloride: 105 mmol/L (ref 101–111)
Creatinine, Ser: 1.26 mg/dL — ABNORMAL HIGH (ref 0.61–1.24)
GFR calc Af Amer: >60 mL/min (ref >60)
GFR calc non Af Amer: 57 mL/min — ABNORMAL LOW (ref >60)
GLUCOSE: 126 mg/dL — AB (ref 65–99)
Potassium: 3.9 mmol/L (ref 3.5–5.1)
Sodium: 140 mmol/L (ref 135–145)
TOTAL PROTEIN: 6 g/dL — AB (ref 6.5–8.1)

## 2017-06-24 LAB — PROTIME-INR
INR: 1.09
PROTHROMBIN TIME: 14.2 s (ref 11.4–15.2)

## 2017-06-24 LAB — PHOSPHORUS: Phosphorus: 4 mg/dL (ref 2.5–4.6)

## 2017-06-24 LAB — MAGNESIUM: Magnesium: 2.3 mg/dL (ref 1.7–2.4)

## 2017-06-24 SURGERY — LOOP RECORDER INSERTION

## 2017-06-24 SURGERY — ECHOCARDIOGRAM, TRANSESOPHAGEAL
Anesthesia: Moderate Sedation

## 2017-06-24 MED ORDER — FENTANYL CITRATE (PF) 100 MCG/2ML IJ SOLN
INTRAMUSCULAR | Status: DC | PRN
  Administered 2017-06-24: 50 ug via INTRAVENOUS
  Administered 2017-06-24: 25 ug via INTRAVENOUS

## 2017-06-24 MED ORDER — MIDAZOLAM HCL 10 MG/2ML IJ SOLN
INTRAMUSCULAR | Status: DC | PRN
  Administered 2017-06-24 (×2): 2 mg via INTRAVENOUS

## 2017-06-24 MED ORDER — ASPIRIN 81 MG PO CHEW: 324.0000 mg | CHEWABLE_TABLET | Freq: Every day | ORAL | 0 refills | Status: DC

## 2017-06-24 MED ORDER — SODIUM CHLORIDE 0.9 % IV SOLN
INTRAVENOUS | Status: DC
  Administered 2017-06-24: 11:00:00 via INTRAVENOUS

## 2017-06-24 MED ORDER — ASPIRIN 325 MG PO TABS: 325.0000 mg | ORAL_TABLET | Freq: Every day | ORAL | 0 refills | Status: AC

## 2017-06-24 MED ORDER — BUTAMBEN-TETRACAINE-BENZOCAINE 2-2-14 % EX AERO
INHALATION_SPRAY | CUTANEOUS | Status: DC | PRN
  Administered 2017-06-24: 2 via TOPICAL

## 2017-06-24 MED ORDER — LIDOCAINE-EPINEPHRINE 1 %-1:100000 IJ SOLN
INTRAMUSCULAR | Status: AC
  Filled 2017-06-24: qty 1

## 2017-06-24 MED ORDER — MIDAZOLAM HCL 5 MG/ML IJ SOLN
INTRAMUSCULAR | Status: AC
  Filled 2017-06-24: qty 2

## 2017-06-24 MED ORDER — LIDOCAINE-EPINEPHRINE 1 %-1:100000 IJ SOLN
INTRAMUSCULAR | Status: DC | PRN
  Administered 2017-06-24: 10 mL

## 2017-06-24 MED ORDER — SENNOSIDES-DOCUSATE SODIUM 8.6-50 MG PO TABS: 1.0000 | ORAL_TABLET | Freq: Every evening | ORAL | 0 refills | Status: DC | PRN

## 2017-06-24 MED ORDER — FENTANYL CITRATE (PF) 100 MCG/2ML IJ SOLN
INTRAMUSCULAR | Status: AC
  Filled 2017-06-24: qty 2

## 2017-06-24 MED ORDER — ATORVASTATIN CALCIUM 20 MG PO TABS: 20.0000 mg | ORAL_TABLET | Freq: Every day | ORAL | 0 refills | Status: DC

## 2017-06-24 MED ORDER — STROKE: EARLY STAGES OF RECOVERY BOOK: 1.0000 | Freq: Once | 0 refills | Status: AC

## 2017-06-24 MED ORDER — CLOPIDOGREL BISULFATE 75 MG PO TABS: 75.0000 mg | ORAL_TABLET | Freq: Every day | ORAL | 0 refills | Status: DC

## 2017-06-24 SURGICAL SUPPLY — 2 items
LOOP REVEAL LINQSYS (Prosthesis & Implant Heart) ×2 IMPLANT
PACK LOOP INSERTION (CUSTOM PROCEDURE TRAY) ×3 IMPLANT

## 2017-06-24 NOTE — Consult Note (Signed)
Cardiology Consultation:   Patient ID: Raymond [name]; [id number]; [birth year]   Admit date: 06/21/2017 Date of Consult: 06/24/2017  Primary Care Provider: Forrest Moron, MD Primary Cardiologist: none   Patient Profile:   Raymond [name] is a 66 y.o. male with a hx of HTN, DM, sleep apnea/CPAP, depression, this month having had a lap-cholecystectomy, R ear infection, and a pneumonia, developed difficult speech who is being seen today for the evaluation of ILR to monitor for AFib in the setting of cryptogenic stroke  at the request of Dr. Pearlean Brownie.  History of Present Illness:   Raymond [name] was initially seen at [facility] and transferred to [facility] for further stroke w/u.  Neurology notes, Left MCA territory infarcts due to left M1 cut off - possibly cardioembolic given no atherosclerosis in the neck - source unknown. DDx may includes A. fib, PFO with DVT, or endocarditis due to recent infections. TEE is scheduled for this morning  Patient denies CP, hx of palpitations, no known AF/arrhythmias or cardiac history.   verapamil home med for HTN management, no known hx of arrhythmia Recent antibotics, recent pneumonia, lap-chole, ear infection, c/w R ear hearing deficit, no ongoing symptoms of illness   LABS: K+ 3.5 BUN/Creat 11/1.09 WBC 6.8 H/H 14/41 plts 262  Past Medical History:  Diagnosis Date  . Arthritis   . Depression   . Diabetes mellitus type 2 in obese (HCC)   . Hypertension   . Migraines   . Sleep apnea     Past Surgical History:  Procedure Laterality Date  . CHOLECYSTECTOMY    . KNEE ARTHROSCOPY    . ROTATOR CUFF REPAIR         Inpatient Medications: Scheduled Meds: .  stroke: mapping our early stages of recovery book   Does not apply Once  . aspirin  324 mg Oral Daily  . atorvastatin  20 mg Oral q1800  . clopidogrel  75 mg Oral Daily  . enoxaparin (LOVENOX) injection  40 mg Subcutaneous Q24H  . insulin aspart  0-5 Units Subcutaneous QHS  . insulin aspart  0-9 Units  Subcutaneous TID WC  . phenytoin  100 mg Oral BID  . propranolol  20 mg Oral BID  . verapamil  120 mg Oral Daily   Continuous Infusions: . sodium chloride     PRN Meds: acetaminophen **OR** acetaminophen (TYLENOL) oral liquid 160 mg/5 mL **OR** acetaminophen, guaiFENesin-codeine, senna-docusate  Allergies:    Allergies  Allergen Reactions  . Flomax [Tamsulosin Hcl]     Effects bp     Social History:   Social History   Social History  . Marital status: Married    Spouse name: N/A  . Number of children: N/A  . Years of education: N/A   Occupational History  . Not on file.   Social History Main Topics  . Smoking status: Light Tobacco Smoker    Types: Cigars  . Smokeless tobacco: Never Used  . Alcohol use No  . Drug use: No  . Sexual activity: Not Currently   Other Topics Concern  . Not on file   Social History Narrative  . No narrative on file    Family History:   Family History  Problem Relation Age of Onset  . Other Mother        reports in good health  . Other Father        was estranged from family, unknown medical history     ROS:  Please see the history  of present illness.  ROS  All other ROS reviewed and negative.     Physical Exam/Data:   Vitals:   06/23/17 2236 06/24/17 0149 06/24/17 0520 06/24/17 0700  BP:  (!) 166/82 (!) 166/82 (!) 154/95  Pulse: 60 (!) 50 (!) 51 (!) 53  Resp: 16 18 16 16   Temp:  99 F (37.2 C) 98.7 F (37.1 C) 98.7 F (37.1 C)  TempSrc:  Oral  Oral  SpO2: 97% 98% 98% 98%  Weight:      Height:        Intake/Output Summary (Last 24 hours) at 06/24/17 0759 Last data filed at 06/23/17 1000  Gross per 24 hour  Intake                0 ml  Output                0 ml  Net                0 ml   Filed Weights   06/21/17 1448 06/21/17 2107  Weight: 223 lb (101.2 kg) 224 lb 13.9 oz (102 kg)   Body mass index is 38.6 kg/m.  General:  Well nourished, well developed, in no acute distress HEENT: normal Lymph: no  adenopathy Neck: no JVD Endocrine:  No thryomegaly Vascular: No carotid bruits; FA pulses 2+ bilaterally without bruits  Cardiac:   RRR; no murmurs gallops or rubs Lungs:  CTA b/l, no wheezing, rhonchi or rales  Abd: soft, non-tender, obese Ext: no edema Musculoskeletal:  No deformities, BUE and BLE strength normal and equal Skin: warm and dry  Neuro:  no gross focal abnormalities noted, occasional word finding difficulty Psych:  Normal affect   EKG:  All EKGs in EPIC were personally reviewed and demonstrates:  SR only Telemetry:  Telemetry was personally reviewed and demonstrates:  SB/SR  Relevant CV Studies:  06/23/17: TTE Study Conclusions - Left ventricle: The cavity size was normal. Wall thickness was   increased in a pattern of mild LVH. Systolic function was normal.   The estimated ejection fraction was in the range of 60% to 65%.   Wall motion was normal; there were no regional wall motion   abnormalities. Doppler parameters are consistent with abnormal   left ventricular relaxation (grade 1 diastolic dysfunction). - Mitral valve: Calcified annulus. Impressions: - Normal LV systolic function; mild diastolic dysfunction; mild   LVH.  06/23/17: LE venous US (PRELIM): **Preliminary report by tech** Bilateral lower extremity venous duplex completed. There is no evidence of deep or superficial vein thrombosis involving the right and left lower extremities. All visualized vessels appear patent and compressible. There is no evidence of Baker's cysts bilaterally   Laboratory Data:  Chemistry  Recent Labs Lab 06/22/17 0901 06/23/17 0332 06/24/17 0350  NA 136 139 140  K 3.6 3.5 3.9  CL 104 104 105  CO2 26 28 27   GLUCOSE 168* 107* 126*  BUN 13 11 14   CREATININE 1.12 1.09 1.26*  CALCIUM 8.1* 8.7* 8.9  GFRNONAA >60 >60 57*  GFRAA >60 >60 >60  ANIONGAP 6 7 8      Recent Labs Lab 06/22/17 0901 06/23/17 0332 06/24/17 0350  PROT 5.4* 6.1* 6.0*  ALBUMIN 2.9* 3.3*  3.3*  AST 22 19 23   ALT 24 23 25   ALKPHOS 27* 33* 33*  BILITOT 0.5 0.7 0.8   Hematology  Recent Labs Lab 06/22/17 0901 06/23/17 0332 06/24/17 0350  WBC 5.0 6.2 6.8  RBC 4.56  4.84 5.00  HGB 13.1 14.2 14.4  HCT 38.4* 40.3 41.7  MCV 84.2 83.3 83.4  MCH 28.7 29.3 28.8  MCHC 34.1 35.2 34.5  RDW 12.4 12.6 12.6  PLT 235 271 262   Cardiac Enzymes  Recent Labs Lab 06/21/17 1522  TROPONINI <0.03   No results for input(s): TROPIPOC in the last 168 hours.  BNPNo results for input(s): BNP, PROBNP in the last 168 hours.  DDimer No results for input(s): DDIMER in the last 168 hours.  Radiology/Studies:  Ct Angio Head W Or Wo Contrast Result Date: 06/22/2017 CLINICAL DATA:  Stroke follow-up. EXAM: CT ANGIOGRAPHY HEAD AND NECK TECHNIQUE: Multidetector CT imaging of the head and neck was performed using the standard protocol during bolus administration of intravenous contrast. Multiplanar CT image reconstructions and MIPs were obtained to evaluate the vascular anatomy. Carotid stenosis measurements (when applicable) are obtained utilizing NASCET criteria, using the distal internal carotid diameter as the denominator. CONTRAST:  50 cc Isovue 370 intravenous COMPARISON:  Brain MRI 12/04/2015 FINDINGS: CTA NECK FINDINGS Aortic arch: Probable atheromatous thickening of the wall diffusely. No acute finding. Three vessel branching. Right carotid system: Questionable non atheromatous wall thickening. No stenosis, dissection, or ulceration. Mild ICA tortuosity. Left carotid system: Questionable non atheromatous wall thickening. No stenosis or ulceration. Vertebral arteries: Left dominant. Vertebral arteries are smooth and widely patent to the dura. No proximal subclavian stenosis. Skeleton: No acute or aggressive finding. Other neck: No incidental mass or adenopathy. Upper chest: No acute finding Review of the MIP images confirms the above findings CTA HEAD FINDINGS Anterior circulation: Symmetric ICA.  Hypoplastic left A1 segment with small but present anterior communicating artery. There is short segment occlusion or critical stenosis of the left M1 segment. On axial slices, initially there is concern for of luminal filling defect, but on reformats this appears more eccentric. Given the acute and chronic MCA watershed infarcts on previous brain MRI there is some chronicity to this stenosis. Left MCA branches are under filled compared to the right. There is atherosclerotic irregularity of bilateral medium sized ACA and MCA vessels. Posterior circulation: Left vertebral artery dominance. There is moderate atherosclerotic irregularity of bilateral PCA vessels, with moderate to advanced bilateral P2 segment stenoses, more advanced on the right. Mild narrowing of the left P1 segment. Venous sinuses: Patent Anatomic variants: None significant Delayed phase: No abnormal intracranial enhancement. A call has been placed to the ordering provider. Review of the MIP images confirms the above findings IMPRESSION: 1. 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 There are acute and chronic infarcts in the left MCA distribution seen on previous brain MRI, all new since 12/05/2015. 2. Atherosclerotic irregularity of medium size vessels, most prominent in the bilateral PCA distribution. P2 segment stenoses are moderate to advanced, greater on the right. 3. Minimal if any atheromatous changes in the neck. No embolic source identified. Electronically Signed   By: Marnee Spring M.D.   On: 06/22/2017 11:19    Dg Chest 2 View Result Date: 06/21/2017 CLINICAL DATA:  Presentation with recent stroke. EXAM: CHEST  2 VIEW COMPARISON:  06/14/2017 FINDINGS: Heart size is normal. Chronic aortic atherosclerosis. Chronic atelectasis or scarring at the lung bases. No sign of active infiltrate, mass, effusion or collapse. IMPRESSION: Chronic basilar scarring or mild basilar atelectasis. No  active disease otherwise. Electronically Signed   By: Paulina Fusi M.D.   On: 06/21/2017 15:59    Ct Head Wo Contrast Result Date:  06/21/2017 CLINICAL DATA:  Focal neurological deficit. Suspected stroke. Greater than 6 hours onset. EXAM: CT HEAD WITHOUT CONTRAST TECHNIQUE: Contiguous axial images were obtained from the base of the skull through the vertex without intravenous contrast. 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 Low-density but without hemorrhage or mass effect. Mild chronic small-vessel changes of the deep white matter. No mass lesion, hydrocephalus or extra-axial collection. Vascular: No abnormal vascular finding. Skull: Negative Sinuses/Orbits: Clear/normal Other: None IMPRESSION: Newly seen low-density in the left caudate head and putamen on the left consistent with late acute/ early subacute infarction. No hemorrhage. Electronically Signed   By: Paulina Fusi M.D.   On: 06/21/2017 15:58    Mr Brain Wo Contrast Result Date: 06/22/2017 CLINICAL DATA:  Ischemic stroke workup. EXAM: MRI HEAD WITHOUT CONTRAST TECHNIQUE: Multiplanar, multiecho pulse sequences of the brain and surrounding structures were obtained without intravenous contrast. COMPARISON:  12/05/2015 FINDINGS: Brain: Perforator infarct on the left affecting the striatum and internal capsule anterior limb on the left. Small left temporoparietal cortex and white matter infarct. There is asymmetric chronic white matter ischemic type changes with lacunes in the left centrum semiovale. Hemosiderin staining along the cortex of the left cerebral hemisphere, roughly in the watershed regions. A coronal T2 weighted acquisition was not obtained. Vascular: Major flow voids are preserved. Less prominent left M2 flow voids. Skull and upper cervical spine: Negative for marrow lesion. Sinuses/Orbits: No acute finding. Mild partial opacification of  mastoid air cells. Negative nasopharynx. IMPRESSION: 1. Acute perforator infarct affecting the left striatum and internal capsule anterior limb. Small acute white matter infarct also in the MCA territory. 2. There are remote ischemic injuries along the left MCA watershed territory. Left M2 flow voids are less apparent, both new from 2017. Recommend CTA characterization, a proximal stenosis is suspected. Electronically Signed   By: Marnee Spring M.D.   On: 06/22/2017 10:54    Assessment and Plan:   1. Cryptogenic stroke     rationale for heart monitoring in the environment of cryptogenic stroke has been discussed with the patient/family.       ILR procedure, risks, benefits as well as alternative monitoring options have been discussed with the patient in detail.     At this time, the patient is clear in his decision, he would like to proceed with loop monitor implant.      wound care (Keep clean and dry for 3 days) has been discussed with the patient     wound check visit will be scheduled for the patient   Signed, Sheilah Pigeon, PA-C  06/24/2017 7:59 AM   I have seen, examined the patient, and reviewed the above assessment and plan.  On exam, RRR. Changes to above are made where necessary.  Risks and benefits of ILR was discussed with the patient who wishes to proceed.  Co Sign: Hillis Range, MD 06/24/2017 11:14 AM

## 2017-06-24 NOTE — Progress Notes (Signed)
  Echocardiogram Echocardiogram Transesophageal has been performed.  Arvil Chaco 06/24/2017, 10:14 AM

## 2017-06-24 NOTE — Progress Notes (Signed)
STROKE TEAM PROGRESS NOTE   SUBJECTIVE (INTERVAL HISTORY) No family at the bedside.  Pt is awake, alert, and follows all commands appropriately.  TEE was normal loop recorder insertion is pending.      OBJECTIVE Temp:  [97.6 F (36.4 C)-99 F (37.2 C)] 97.7 F (36.5 C) (08/21 1028) Pulse Rate:  [49-60] 51 (08/21 1028) Cardiac Rhythm: Sinus bradycardia (08/21 0955) Resp:  [7-20] 18 (08/21 1028) BP: (122-191)/(49-95) 148/69 (08/21 1028) SpO2:  [95 %-100 %] 96 % (08/21 1028)  CBC:   Recent Labs Lab 06/23/17 0332 06/24/17 0350  WBC 6.2 6.8  NEUTROABS 3.5 4.2  HGB 14.2 14.4  HCT 40.3 41.7  MCV 83.3 83.4  PLT 271 262    Basic Metabolic Panel:   Recent Labs Lab 06/23/17 0332 06/24/17 0350  NA 139 140  K 3.5 3.9  CL 104 105  CO2 28 27  GLUCOSE 107* 126*  BUN 11 14  CREATININE 1.09 1.26*  CALCIUM 8.7* 8.9  MG 2.1 2.3  PHOS 3.5 4.0    Lipid Panel:     Component Value Date/Time   CHOL 168 06/22/2017 0347   TRIG 130 06/22/2017 0347   HDL 30 (L) 06/22/2017 0347   CHOLHDL 5.6 06/22/2017 0347   VLDL 26 06/22/2017 0347   LDLCALC 112 (H) 06/22/2017 0347   HgbA1c:  Lab Results  Component Value Date   HGBA1C 6.1 (H) 06/22/2017   Urine Drug Screen:     Component Value Date/Time   LABOPIA NONE DETECTED 06/22/2017 2323   COCAINSCRNUR NONE DETECTED 06/22/2017 2323   LABBENZ NONE DETECTED 06/22/2017 2323   AMPHETMU NONE DETECTED 06/22/2017 2323   THCU NONE DETECTED 06/22/2017 2323   LABBARB NONE DETECTED 06/22/2017 2323    Alcohol Level     Component Value Date/Time   [facility] <11 10/20/2012 2113    IMAGING  Dg Chest 2 View 06/21/2017 Chronic basilar scarring or mild basilar atelectasis. No active disease otherwise.   Ct Head Wo Contrast 06/21/2017 Newly seen low-density in the left caudate head and putamen on the left consistent with late acute / early subacute infarction. No hemorrhage.   MR Brain Wo Contrast  06/22/2017 1. Acute perforator infarct  affecting the left striatum and internal capsule anterior limb. Small acute white matter infarct also in the MCA territory. 2. There are remote ischemic injuries along the left MCA watershed territory. Left M2 flow voids are less apparent, both new from 2017. Recommend CTA characterization, a proximal stenosis is suspected.  CTA H&N 06/22/2017 1. 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 There are acute and chronic infarcts in the left MCA distribution seen on previous brain MRI, all new since 12/05/2015. 2. Atherosclerotic irregularity of medium size vessels, most prominent in the bilateral PCA distribution. P2 segment stenoses are moderate to advanced, greater on the right. 3. Minimal if any atheromatous changes in the neck. No embolic source identified.  LE venous Doppler 06/23/2017 There is no evidence of deep or superficial vein thrombosis involving the right and left lower extremities. All visualized vessels appear patent and compressible. There is no evidence of Baker's cysts bilaterally.  2-D Echo 06/23/2017 Study Conclusions - Left ventricle: The cavity size was normal. Wall thickness was   increased in a pattern of mild LVH. Systolic function was normal.  The estimated ejection fraction was in the range of 60% to 65%.  Wall motion was normal; there were no regional wall motion abnormalities. Doppler  parameters are consistent with abnormal left ventricular relaxation (grade 1 diastolic dysfunction). - Mitral valve: Calcified annulus. Impressions: - Normal LV systolic function; mild diastolic dysfunction; mild LVH.  TEE  06/24/2017 pending   PHYSICAL EXAM Vitals:   06/24/17 0955 06/24/17 1000 06/24/17 1010 06/24/17 1028  BP: (!) 160/82 (!) 147/79 (!) 163/79 (!) 148/69  Pulse: (!) 52 (!) 53 (!) 52 (!) 51  Resp: 19 19 18 18   Temp: 98.2 F (36.8 C)   97.7 F (36.5 C)  TempSrc: Oral   Oral  SpO2: 95% 96% 95% 96%  Weight:       Height:        Temp:  [97.6 F (36.4 C)-99 F (37.2 C)] 97.7 F (36.5 C) (08/21 1028) Pulse Rate:  [49-60] 51 (08/21 1028) Resp:  [7-20] 18 (08/21 1028) BP: (122-191)/(49-95) 148/69 (08/21 1028) SpO2:  [95 %-100 %] 96 % (08/21 1028)  General - Well nourished, well developed, in no apparent distress.  Ophthalmologic - Fundi not visualized due to noncooperation.  Cardiovascular - Regular rate and rhythm.  Mental Status -  Level of arousal and orientation to time, place, and person were intact. Language including expression, naming, repetition, comprehension was assessed and found intact.  Fund of Knowledge was assessed and was intact.  Cranial Nerves II - XII - II - Visual field intact OU. III, IV, VI - Extraocular movements intact. V - Facial sensation intact bilaterally. VII - right nasolabial fold flattening. VIII - Hearing & vestibular intact bilaterally. X - Palate elevates symmetrically. XI - Chin turning & shoulder shrug intact bilaterally. XII - Tongue protrusion intact.  Motor Strength - The patient's strength was normal in all extremities and pronator drift was absent.  Bulk was normal and fasciculations were absent.   Motor Tone - Muscle tone was assessed at the neck and appendages and was normal.  Reflexes - The patient's reflexes were 1+ in all extremities and he had no pathological reflexes.  Sensory - Light touch, temperature/pinprick were assessed and were symmetrical.    Coordination - The patient had normal movements in the hands with no ataxia or dysmetria.  Tremor was absent.  Gait and Station - deferred.   ASSESSMENT/PLAN Raymond [name] is a 66 y.o. male with history of obstructive sleep apnea, migraines, hypertension, diabetes mellitus, recent ear infection, recent gallbladder surgery, and recent pneumonia presenting with confusion and speech difficulties. He did not receive IV t-PA due to late presentation.  Strokes:  Left MCA territory  infarcts due to left M1 cut off - possibly cardioembolic given no atherosclerosis in the neck - source unknown. DDx may includes A. fib, PFO with DVT, or endocarditis due to recent infections.   Resultant  psychomotor slowing, right nasolabial fold flattening  CT head - acute infarct in the left caudate head and putamen on the left.  MRI head - Acute infarct affecting the left striatum and internal capsule anterior limb. Patchy left parietal cortical infarct.  CTA H&N - occlusion or pre-occlusive stenosis of the left M1 segment, suspicious for thrombus  LE venous doppler - No DVT  2D Echo - EF 60-65%. No source of embolus    TEE normal  LDL - 112  HgbA1c - 6.1  VTE prophylaxis - Lovenox    No antithrombotic prior to admission, now on aspirin 325 mg daily and plavix 75mg . Continue DAPT for 3 months and the plavix alone due to intracranial atherosclerosis   Patient counseled to be compliant with his antithrombotic  medications  Ongoing aggressive stroke risk factor management  Therapy recommendations: PT evaluation none needed Disposition: home Left MCA occlusion  Concerning for embolic source  CTA neck no ICA stenosis or plaque   Further embolic work up pending  BP goal 130-150   Hypertension  Stable - on multiple antihypertensive medications.  Permissive hypertension (OK if < 220/120) but gradually normalize in 5-7 days  Long-term BP goal 130-150 due to left M1 occlusion  Hyperlipidemia  Home meds: No lipid lowering medications prior to admission.  LDL 112, goal < 70  Now on Lipitor 20 mg daily  Continue statin at discharge  Diabetes  HgbA1c 6.1, goal < 7.0  Controlled  SSI  CBG monitoring  Tobacco abuse  Current smoker  Smoking cessation counseling provided  Pt is willing to quit  Other Stroke Risk Factors  Advanced age  Obesity, Body mass index is 38.6 kg/m., recommend weight loss, diet and exercise as appropriate    Migraines  Obstructive sleep apnea  Other Active Problems  Chronic vertigo - following with ENT - on low dose Dilantin and propranolol    right hearing loss after ear infection - May consider ENT consultation    recent gallbladder surgery followed by pneumonia   Hospital day # 3  Plan await   loop recorder insertion and Dc home after that. Discussed with Dr. Junie Panning. Stroke team will sign off. Kindly call for questions. Follow-up as an outpatient in the stroke clinic in 6 weeks 06/24/2017 1:05 PM    To contact Stroke Continuity provider, please refer to [email or web address]. After hours, contact General Neurology

## 2017-06-24 NOTE — Progress Notes (Signed)
Patient back from loop recorder insertion.  Alert, verbally pleasant.  No complaints. Dressing clean, dry, and intact.

## 2017-06-24 NOTE — Discharge Instructions (Signed)
Implantable Loop Recorder Placement, Care After  Refer to this sheet in the next few weeks. These instructions provide you with information about caring for yourself after your procedure. Your health care provider may also give you more specific instructions. Your treatment has been planned according to current medical practices, but problems sometimes occur. Call your health care provider if you have any problems or questions after your procedure.  What can I expect after the procedure?  After the procedure, it is common to have:   Soreness or pain near the cut from surgery (incision).   Some swelling or bruising near the incision.    Follow these instructions at home:  Medicines   Take over-the-counter and prescription medicines only as told by your health care provider.   If you were prescribed an antibiotic medicine, take it as told by your health care provider. Do not stop taking the antibiotic even if you start to feel better.  Bathing   Do not take baths, swim, or use a hot tub until your health care provider approves. Ask your health care provider if you can take showers. You may only be allowed to take sponge baths for bathing.  Incision care   Follow instructions from your health care provider about how to take care of your incision. Make sure you:  ? Wash your hands with soap and water before you change your bandage (dressing). If soap and water are not available, use hand sanitizer.  ? Change your dressing as told by your health care provider.  ? Keep your dressing dry.  ? Leave stitches (sutures), skin glue, or adhesive strips in place. These skin closures may need to stay in place for 2 weeks or longer. If adhesive strip edges start to loosen and curl up, you may trim the loose edges. Do not remove adhesive strips completely unless your health care provider tells you to do that.   Check your incision area every day for signs of infection. Check for:  ? More redness, swelling, or pain.  ? Fluid  or blood.  ? Warmth.  ? Pus or a bad smell.  Driving   If you received a sedative, do not drive for 24 hours after the procedure.   If you did not receive a sedative, ask your health care provider when it is safe to drive.  Activity   Return to your normal activities as told by your health care provider. Ask your health care provider what activities are safe for you.   Until your health care provider says it is safe:  ? Do not lift anything that is heavier than 10 lb (4.5 kg).  ? Do not do activities that involve lifting your arms over your head.  General instructions     Follow instructions from your health care provider about how and when to use your implantable loop recorder.   Do not go through a metal detection gate, and do not let someone hold a metal detector over your chest. Show your ID card.   Do not have an MRI unless you check with your health care provider first.   Do not use any tobacco products, such as cigarettes, chewing tobacco, and e-cigarettes. Tobacco can delay healing. If you need help quitting, ask your health care provider.   Keep all follow-up visits as told by your health care provider. This is important.  Contact a health care provider if:   You have more redness, swelling, or pain around your incision.     You have more fluid or blood coming from your incision.   Your incision feels warm to the touch.   You have pus or a bad smell coming from your incision.   You have a fever.   You have pain that is not relieved by your pain medicine.   You have triggered your device because of fainting (syncope) or because of a heartbeat that feels like it is racing, slow, fluttering, or skipping (palpitations).  Get help right away if:   You have chest pain.   You have difficulty breathing.  This information is not intended to replace advice given to you by your health care provider. Make sure you discuss any questions you have with your health care provider.  Document Released:  10/02/2015 Document Revised: 03/28/2016 Document Reviewed: 07/26/2015  [employer] Interactive Patient Education  2018 [employer]

## 2017-06-24 NOTE — Discharge Summary (Signed)
Physician Discharge Summary  Raymond [name] JYN:[id number] DOB: Dec 23, 1950 DOA: 06/21/2017  PCP: Forrest Moron, MD  Admit date: 06/21/2017 Discharge date: 06/24/2017  Admitted From: Home Disposition:  Home  Recommendations for Outpatient Follow-up:  1. Follow up with PCP in 1-2 weeks; Have PCP add BP Medications back slowly for Permissive HTN; Goal Systolic between [id number] 2. Follow up with Cardiology as an outpatient  3. Follow up with Neurology within 6 weeks 4. Please obtain CMP/CBC, Mag, Phos in one week  Home Health: No Equipment/Devices: None   Discharge Condition: Stable  CODE STATUS: FULL CODE Diet recommendation: Heart Healthy/ Carb Modified Diet  Brief/Interim Summary: Raymond Dudenhoeffer Allenis a 66 y.o.malewith medical history significant of HTN and DM type 2, sleep apnea on CPAP, and Depression; who presents withdifficulty expressing what he would like to say. The past 24 hours patient was reportedly more withdrawn and out of it per family members. They noticed that he would take long pauses and sometimes just say he couldn't get out what he would like to say which was unusual for him. Over the last 3weeks patient has had a decline in his health. He was evaluated by his primary care doctor on July 31 for complaints of problems with his right ear and was prescribed Augmentin. At baseline patient has hearing loss on the left, but his right ear was given his reported good ear. The patient was taking antibiotics and became sick with nausea and vomiting for which his evaluated on 8/6, and found to have acute cholecystitis. He underwent laparoscopic cholecystectomy on 8/7. The following week family notes that he was diagnosed with pneumonia on 8/11 and has been on clindamycin since that time. Associated symptoms include decreased hearing out of the right ear. Patient will has been set up to see a ENT specialist sometime next week. He was worked up for his expressive aphasia and found to have  an Acute CVA that was thought to be from [employer] and so patient under went TEE and Loop Recorder Insertion. He did well and improved and was deemed medically stable to D/C Home. He will need to follow up with PCP, Neurology, and Cardiology in the outpatient setting.    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  -CT Head Scan showed Newly seen low-density in the left caudate head and putamen on the left consistent with late acute/ early subacute infarction. No hemorrhage -CTA Head and Neck showed Occlusion or pre occlusive stenosis of the left M1 segment with downstream under filled branches. There are acute and chronic infarcts in the left MCA distribution seen on previous brain MRI, all new since 12/05/2015.  Atherosclerotic irregularity of medium size vessels, most prominent in the bilateral PCA distribution. P2 segment stenoses are moderate to advanced, greater on the right. Minimal if any atheromatous changes in the neck. No embolic source identified. 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 Small acute white matter infarct also in the MCA territory. There are remote ischemic injuries along the left MCA watershed territory. Left M2 flow voids are less apparent, both new from 2017.  -Neurology Consulted and think ?Cardioembolic CVA -  Lipid Panel Showed Cholesterol of 168, HDL of 30, LDL of 112, TG of 130, VLDL of 26 -Lower Extremity Doppler Negative for DVT -Transthoracic ECHO showed Normal LV systolic function; mild diastolic dysfunction; mild   LVH. -Neurology recommending  Trans-Esophageal ECHO to r/o Endocarditis/PFO/Atrial Fibrillation and was Normal -Loop Recorder inserted  -Hemoglobin A1c was 6.1 -C/w 325 mg of ASA daily and 75 mg Plavix daily for 3 Months and then Just Plavix due due Intracranial Atheroschlerosis -Neurology was consulted and recommended completion of stroke workup -PT/OT recommend no Follow up  -Troponin was <0.03 -C/w Atorvastatin 20 mg po qHS -Follow up with PCP and Neurology as an outpatient   Hearing loss right ear, suspected otitis media:  -Patient reports previously being diagnosed with otitis media. Appears did not complete course of Augmentin.  -Question if related to possible stroke.Patient has been treated with Augmentin and clindamycin for community-acquired pneumonia -Follow-up with ENT as outpatient   Essential Hypertension -Allow for Permissive HTN -On Losartan HCTZ 100/25 mg po Daily, Propanolol 20 mg po BID, and Verpamil 120 mg po Daily at home and Metoprolol 100 mg po BID. Hold Losartan-HCTZ and Metoprolol to allow for Permissive HTN; C/w Propanolol and Verapamil -Follow up with PCP for further blood pressure medication adjustments  Community-Acquired Pneumonia:  - Recently diagnosed on an patient reported being just about to complete his antibiotic course. Chest x-ray clear. - Discontinued Antibiotics  Inner ear dysfunction/Chronic Vertigo - Continue Propranolol 20 mg po BID and Phenytoin 100 mg po BID - Has appointment with ENT this week   Depression - Continue Home Celexa   Diabetes mellitus type 2:  - Patient reports only being on oral medications of Metformin at home. - Hypoglycemic protocols while hospitalized  - Resume Home metformin - Follow-up hemoglobin A1c was 6.1 - CBGs every before meals and at bedtime with sensitive Novolog SSI   OSAon CPAP - CPAP at night  Discharge Instructions  Discharge Instructions    Ambulatory referral to Neurology    Complete by:  As directed    An  appointment is requested in approximately: 6 weeks   Call MD for:  difficulty breathing, headache or visual disturbances    Complete by:  As directed    Call MD for:  extreme fatigue    Complete by:  As directed    Call MD for:  hives    Complete by:  As directed    Call MD for:  persistant dizziness or light-headedness    Complete by:  As directed    Call MD for:  persistant nausea and vomiting    Complete by:  As directed    Call MD for:  redness, tenderness, or signs of infection (pain, swelling, redness, odor or green/yellow discharge around incision site)    Complete by:  As directed    Call MD for:  severe uncontrolled pain    Complete by:  As directed    Call MD for:  temperature >100.4    Complete by:  As directed    Diet - low sodium heart healthy    Complete by:  As directed    Discharge instructions    Complete by:  As directed    Follow up with PCP, Neurology and Cardiology as an outpatient. Will hold BP somemedications until slowly added back by PCP. Take all medications as prescribed. If symptoms changer or worsen please return to the ED for evaluation   Increase activity slowly    Complete by:  As directed      Allergies as of 06/24/2017      Reactions   Flomax [tamsulosin Hcl]    Effects bp       Medication List    STOP taking these medications   amoxicillin-clavulanate 875-125 MG tablet Commonly known as:  AUGMENTIN   clindamycin 300 MG capsule Commonly known as:  CLEOCIN   losartan-hydrochlorothiazide 100-25 MG tablet Commonly known as:  HYZAAR   metoprolol tartrate 100 MG tablet Commonly known as:  LOPRESSOR     TAKE these medications    stroke: mapping our early stages of recovery book Misc 1 each by Does not apply route once.   acetaminophen 500 MG tablet Commonly known as:  TYLENOL Take 2 tablets (1,000 mg total) by mouth every 6 (six) hours as needed. What changed:  reasons to take this   aspirin 325 MG tablet Commonly known as:  BAYER  ASPIRIN Take 1 tablet (325 mg total) by mouth daily.   atorvastatin 20 MG tablet Commonly known as:  LIPITOR Take 1 tablet (20 mg total) by mouth daily at 6 PM.   citalopram 40 MG tablet Commonly known as:  CELEXA Take 1 tablet (40 mg total) by mouth daily.   clopidogrel 75 MG tablet Commonly known as:  PLAVIX Take 1 tablet (75 mg total) by mouth daily.   guaiFENesin-codeine 100-10 MG/5ML syrup Take 5 mLs by mouth every 6 (six) hours as needed for cough.   hydrOXYzine 50 MG tablet Commonly known as:  ATARAX/VISTARIL Take 1 tablet (50 mg total) by mouth at bedtime as needed for anxiety.   metFORMIN 500 MG tablet Commonly known as:  GLUCOPHAGE Take 500 mg by mouth at bedtime.   naproxen 500 MG tablet Commonly known as:  NAPROSYN Take 1 tablet (500 mg total) by mouth 3 (three) times daily as needed. What changed:  reasons to take this   nortriptyline 25 MG capsule Commonly known as:  PAMELOR Take 1 capsule (25 mg total) by mouth at bedtime.   phenytoin 100 MG ER capsule Commonly known as:  DILANTIN Take 100 mg by mouth 2 (two) times daily.   propranolol 20 MG tablet Commonly known as:  INDERAL Take 20 mg by mouth 2 (two) times daily.   senna-docusate 8.6-50 MG tablet Commonly known as:  Senokot-S Take 1 tablet by mouth at bedtime as needed for mild constipation.   verapamil 120 MG CR tablet Commonly known as:  CALAN-SR Take 120 mg by mouth at bedtime.      Follow-up Information    [facility] Follow up on 07/03/2017.   Specialty:  Cardiology Why:  at 2:30PM Contact information: [address], [address] [place] [address] [phone]       Forrest Moron, MD. Call in 1 week(s).   Specialty:  Internal Medicine Why:  Call to scheudle appointment within week Contact information: [address] [place] [address] [phone]        Micki Riley, MD. Call in 6 week(s).   Specialties:  Neurology,  Radiology Why:  Appointment will be made for you in 6 weeks.  Contact information: [address] [place] [address] [phone]          Allergies  Allergen Reactions  . Flomax [Tamsulosin Hcl]     Effects bp     Consultations:  Neurology/Stroke Team  Cardiology  Electrophysiology  Procedures/Studies: Ct Angio Head W Or Wo Contrast  Result Date: 06/22/2017 CLINICAL DATA:  Stroke follow-up. EXAM: CT ANGIOGRAPHY HEAD AND NECK TECHNIQUE: Multidetector CT imaging of the head and neck was performed using the standard protocol during bolus administration of intravenous contrast. Multiplanar CT image reconstructions and MIPs were obtained to evaluate the vascular anatomy. Carotid stenosis measurements (when applicable) are obtained utilizing NASCET criteria, using the distal internal carotid diameter as the denominator. CONTRAST:  50 cc Isovue 370 intravenous COMPARISON:  Brain MRI 12/04/2015 FINDINGS: CTA NECK FINDINGS Aortic arch: Probable atheromatous thickening of the wall diffusely. No acute finding. Three vessel branching. Right carotid system: Questionable non atheromatous wall thickening. No stenosis, dissection, or ulceration. Mild ICA tortuosity. Left carotid system: Questionable non atheromatous wall thickening. No stenosis or ulceration. Vertebral arteries: Left dominant. Vertebral arteries are smooth and widely patent to the dura. No proximal subclavian stenosis. Skeleton: No acute or aggressive finding. Other neck: No incidental mass or adenopathy. Upper chest: No acute finding Review of the MIP images confirms the above findings CTA HEAD FINDINGS Anterior circulation: Symmetric ICA. Hypoplastic left A1 segment with small but present anterior communicating artery. There is short segment occlusion or critical stenosis of the left M1 segment. On axial slices, initially there is concern for of luminal filling defect, but on reformats this appears more eccentric. Given  the acute and chronic MCA watershed infarcts on previous brain MRI there is some chronicity to this stenosis. Left MCA branches are under filled compared to the right. There is atherosclerotic irregularity of bilateral medium sized ACA and MCA vessels. Posterior circulation: Left vertebral artery dominance. There is moderate atherosclerotic irregularity of bilateral PCA vessels, with moderate to advanced bilateral P2 segment stenoses, more advanced on the right. Mild narrowing of the left P1 segment. Venous sinuses: Patent Anatomic variants: None significant Delayed phase: No abnormal intracranial enhancement. A call has been placed to the ordering provider. Review of the MIP images confirms the above findings IMPRESSION: 1. 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 There are acute and chronic infarcts in the left MCA distribution seen on previous brain MRI, all new since 12/05/2015. 2. Atherosclerotic irregularity of medium size vessels, most prominent in the bilateral PCA distribution. P2 segment stenoses are moderate to advanced, greater on the right. 3. Minimal if any atheromatous changes in the neck. No embolic source identified. Electronically Signed   By: Marnee Spring M.D.   On: 06/22/2017 11:19   Dg Chest 2 View  Result Date: 06/21/2017 CLINICAL DATA:  Presentation with recent stroke. EXAM: CHEST  2 VIEW COMPARISON:  06/14/2017 FINDINGS: Heart size is normal. Chronic aortic atherosclerosis. Chronic atelectasis or scarring at the lung bases. No sign of active infiltrate, mass, effusion or collapse. IMPRESSION: Chronic basilar scarring or mild basilar atelectasis. No active disease otherwise. Electronically Signed   By: Paulina Fusi M.D.   On: 06/21/2017 15:59   Dg Chest 2 View  Result Date: 06/14/2017 CLINICAL DATA:  Acute onset of fever and cough. Lower abdominal pain. Initial encounter. EXAM: CHEST  2 VIEW COMPARISON:  Chest radiograph  performed 11/17/2010 FINDINGS: The lungs are well-aerated. Mild right basilar airspace opacity raises concern for pneumonia. There is no evidence of pleural effusion or pneumothorax. The heart is mildly enlarged. No acute osseous abnormalities are seen. Clips are noted within the right upper quadrant, reflecting prior cholecystectomy. IMPRESSION: Mild right basilar airspace opacity raises concern for pneumonia. Mild cardiomegaly. Electronically Signed   By: Roanna Raider M.D.   On: 06/14/2017  22:03   Ct Head Wo Contrast  Result Date: 06/21/2017 CLINICAL DATA:  Focal neurological deficit. Suspected stroke. Greater than 6 hours onset. EXAM: CT HEAD WITHOUT CONTRAST TECHNIQUE: Contiguous axial images were obtained from the base of the skull through the vertex without intravenous contrast. 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 Low-density but without hemorrhage or mass effect. Mild chronic small-vessel changes of the deep white matter. No mass lesion, hydrocephalus or extra-axial collection. Vascular: No abnormal vascular finding. Skull: Negative Sinuses/Orbits: Clear/normal Other: None IMPRESSION: Newly seen low-density in the left caudate head and putamen on the left consistent with late acute/ early subacute infarction. No hemorrhage. Electronically Signed   By: Paulina Fusi M.D.   On: 06/21/2017 15:58   Ct Angio Neck W Or Wo Contrast  Result Date: 06/22/2017 CLINICAL DATA:  Stroke follow-up. EXAM: CT ANGIOGRAPHY HEAD AND NECK TECHNIQUE: Multidetector CT imaging of the head and neck was performed using the standard protocol during bolus administration of intravenous contrast. Multiplanar CT image reconstructions and MIPs were obtained to evaluate the vascular anatomy. Carotid stenosis measurements (when applicable) are obtained utilizing NASCET criteria, using the distal internal carotid diameter as the  denominator. CONTRAST:  50 cc Isovue 370 intravenous COMPARISON:  Brain MRI 12/04/2015 FINDINGS: CTA NECK FINDINGS Aortic arch: Probable atheromatous thickening of the wall diffusely. No acute finding. Three vessel branching. Right carotid system: Questionable non atheromatous wall thickening. No stenosis, dissection, or ulceration. Mild ICA tortuosity. Left carotid system: Questionable non atheromatous wall thickening. No stenosis or ulceration. Vertebral arteries: Left dominant. Vertebral arteries are smooth and widely patent to the dura. No proximal subclavian stenosis. Skeleton: No acute or aggressive finding. Other neck: No incidental mass or adenopathy. Upper chest: No acute finding Review of the MIP images confirms the above findings CTA HEAD FINDINGS Anterior circulation: Symmetric ICA. Hypoplastic left A1 segment with small but present anterior communicating artery. There is short segment occlusion or critical stenosis of the left M1 segment. On axial slices, initially there is concern for of luminal filling defect, but on reformats this appears more eccentric. Given the acute and chronic MCA watershed infarcts on previous brain MRI there is some chronicity to this stenosis. Left MCA branches are under filled compared to the right. There is atherosclerotic irregularity of bilateral medium sized ACA and MCA vessels. Posterior circulation: Left vertebral artery dominance. There is moderate atherosclerotic irregularity of bilateral PCA vessels, with moderate to advanced bilateral P2 segment stenoses, more advanced on the right. Mild narrowing of the left P1 segment. Venous sinuses: Patent Anatomic variants: None significant Delayed phase: No abnormal intracranial enhancement. A call has been placed to the ordering provider. Review of the MIP images confirms the above findings IMPRESSION: 1. 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 There are acute and  chronic infarcts in the left MCA distribution seen on previous brain MRI, all new since 12/05/2015. 2. Atherosclerotic irregularity of medium size vessels, most prominent in the bilateral PCA distribution. P2 segment stenoses are moderate to advanced, greater on the right. 3. Minimal if any atheromatous changes in the neck. No embolic source identified. Electronically Signed   By: Marnee Spring M.D.   On: 06/22/2017 11:19   Mr Brain Wo Contrast  Result Date: 06/22/2017 CLINICAL DATA:  Ischemic stroke workup. EXAM: MRI HEAD WITHOUT CONTRAST TECHNIQUE: Multiplanar, multiecho pulse sequences of the brain and surrounding structures  were obtained without intravenous contrast. COMPARISON:  12/05/2015 FINDINGS: Brain: Perforator infarct on the left affecting the striatum and internal capsule anterior limb on the left. Small left temporoparietal cortex and white matter infarct. There is asymmetric chronic white matter ischemic type changes with lacunes in the left centrum semiovale. Hemosiderin staining along the cortex of the left cerebral hemisphere, roughly in the watershed regions. A coronal T2 weighted acquisition was not obtained. Vascular: Major flow voids are preserved. Less prominent left M2 flow voids. Skull and upper cervical spine: Negative for marrow lesion. Sinuses/Orbits: No acute finding. Mild partial opacification of mastoid air cells. Negative nasopharynx. IMPRESSION: 1. Acute perforator infarct affecting the left striatum and internal capsule anterior limb. Small acute white matter infarct also in the MCA territory. 2. There are remote ischemic injuries along the left MCA watershed territory. Left M2 flow voids are less apparent, both new from 2017. Recommend CTA characterization, a proximal stenosis is suspected. Electronically Signed   By: Marnee Spring M.D.   On: 06/22/2017 10:54    TEE Left Ventrical:  Mild LV dysfunction - EF 45%   Mitral Valve: mild MR   Aortic Valve: normal AV    Tricuspid Valve: normal   Pulmonic Valve: normal   Left Atrium/ Left atrial appendage: small, no thrombi   Atrial septum: intact by color flow and bubble contrast   Aorta: minimal plaque,  Essentially normal   TTE Study Conclusions  - Left ventricle: The cavity size was normal. Wall thickness was   increased in a pattern of mild LVH. Systolic function was normal.   The estimated ejection fraction was in the range of 60% to 65%.   Wall motion was normal; there were no regional wall motion   abnormalities. Doppler parameters are consistent with abnormal   left ventricular relaxation (grade 1 diastolic dysfunction). - Mitral valve: Calcified annulus.  Impressions:  - Normal LV systolic function; mild diastolic dysfunction; mild   LVH.  LOWER EXTREMITY DOPPLER There is no evidence of deep or superficial vein thrombosis involving the right and left lower extremities. All visualized vessels appear patent and compressible. There is no evidence of Baker's cysts bilaterally.  LOOP RECORDER INSERTION  Subjective: Seen and examined and thinks hearing was a bit better. Family thinks he is improved. No CP or SOB. Wanting to go home.  Discharge Exam: Vitals:   06/24/17 1028 06/24/17 1359  BP: (!) 148/69 (!) 161/72  Pulse: (!) 51 (!) 51  Resp: 18 16  Temp: 97.7 F (36.5 C) 98.2 F (36.8 C)  SpO2: 96% 98%   Vitals:   06/24/17 1000 06/24/17 1010 06/24/17 1028 06/24/17 1359  BP: (!) 147/79 (!) 163/79 (!) 148/69 (!) 161/72  Pulse: (!) 53 (!) 52 (!) 51 (!) 51  Resp: 19 18 18 16   Temp:   97.7 F (36.5 C) 98.2 F (36.8 C)  TempSrc:   Oral Oral  SpO2: 96% 95% 96% 98%  Weight:      Height:       General: Pt is alert, awake, not in acute distress Cardiovascular: RRR, S1/S2 +, no rubs, no gallops Respiratory: CTA bilaterally, no wheezing, no rhonchi Abdominal: Soft, NT, ND, bowel sounds + Extremities: no edema, no cyanosis  The results of significant diagnostics from  this hospitalization (including imaging, microbiology, ancillary and laboratory) are listed below for reference.    Microbiology: Recent Results (from the past 240 hour(s))  Urine culture     Status: None   Collection Time: 06/21/17  3:06 PM  Result Value Ref Range Status   Specimen Description URINE, CLEAN CATCH  Final   Special Requests Normal  Final   Culture   Final    NO GROWTH Performed at [facility], [address] [place], [place] [address]    Report Status 06/23/2017 FINAL  Final  Culture, blood (routine x 2)     Status: None (Preliminary result)   Collection Time: 06/21/17  3:15 PM  Result Value Ref Range Status   Specimen Description BLOOD LEFT ANTECUBITAL  Final   Special Requests   Final    Blood Culture adequate volume BOTTLES DRAWN AEROBIC AND ANAEROBIC   Culture   Final    NO GROWTH 2 DAYS Performed at [facility], [address], [place], [place] [address]    Report Status PENDING  Incomplete  Culture, blood (routine x 2)     Status: None (Preliminary result)   Collection Time: 06/21/17  4:10 PM  Result Value Ref Range Status   Specimen Description BLOOD RIGHT ANTECUBITAL  Final   Special Requests Blood Culture adequate volume IN PEDIATRIC BOTTLE  Final   Culture   Final    NO GROWTH 2 DAYS Performed at [facility], [address], [place], [place] [address]    Report Status PENDING  Incomplete    Labs: BNP (last 3 results) No results for input(s): BNP in the last 8760 hours. Basic Metabolic Panel:  Recent Labs Lab 06/21/17 1520 06/22/17 0901 06/23/17 0332 06/24/17 0350  NA 137 136 139 140  K 3.9 3.6 3.5 3.9  CL 101 104 104 105  CO2 27 26 28 27   GLUCOSE 222* 168* 107* 126*  BUN 15 13 11 14   CREATININE 1.13 1.12 1.09 1.26*  CALCIUM 8.7* 8.1* 8.7* 8.9  MG  --  1.9 2.1 2.3  PHOS  --  3.3 3.5 4.0   Liver Function Tests:  Recent Labs Lab 06/21/17 1520 06/22/17 0901 06/23/17 0332 06/24/17 0350  AST 25 22 19 23   ALT  28 24 23 25   ALKPHOS 33* 27* 33* 33*  BILITOT 0.5 0.5 0.7 0.8  PROT 6.4* 5.4* 6.1* 6.0*  ALBUMIN 3.4* 2.9* 3.3* 3.3*    Recent Labs Lab 06/21/17 1520  LIPASE 42   No results for input(s): AMMONIA in the last 168 hours. CBC:  Recent Labs Lab 06/21/17 1520 06/22/17 0901 06/23/17 0332 06/24/17 0350  WBC 6.4 5.0 6.2 6.8  NEUTROABS 4.3 3.0 3.5 4.2  HGB 13.8 13.1 14.2 14.4  HCT 39.7 38.4* 40.3 41.7  MCV 85.6 84.2 83.3 83.4  PLT 280 235 271 262   Cardiac Enzymes:  Recent Labs Lab 06/21/17 1522  TROPONINI <0.03   BNP: Invalid input(s): POCBNP CBG:  Recent Labs Lab 06/23/17 1210 06/23/17 1636 06/23/17 2126 06/24/17 0612 06/24/17 1057  GLUCAP 137* 203* 164* 133* 109*   D-Dimer No results for input(s): DDIMER in the last 72 hours. Hgb A1c  Recent Labs  06/22/17 0347  HGBA1C 6.1*   Lipid Profile  Recent Labs  06/22/17 0347  CHOL 168  HDL 30*  LDLCALC 112*  TRIG 130  CHOLHDL 5.6   Thyroid function studies No results for input(s): TSH, T4TOTAL, T3FREE, THYROIDAB in the last 72 hours.  Invalid input(s): FREET3 Anemia work up No results for input(s): VITAMINB12, FOLATE, FERRITIN, TIBC, IRON, RETICCTPCT in the last 72 hours. Urinalysis    Component Value Date/Time   COLORURINE YELLOW 06/21/2017 1506   APPEARANCEUR  CLEAR 06/21/2017 1506   LABSPEC 1.023 06/21/2017 1506   PHURINE 6.5 06/21/2017 1506   GLUCOSEU 250 (A) 06/21/2017 1506   HGBUR NEGATIVE 06/21/2017 1506   BILIRUBINUR NEGATIVE 06/21/2017 1506   KETONESUR NEGATIVE 06/21/2017 1506   PROTEINUR NEGATIVE 06/21/2017 1506   UROBILINOGEN 2.0 (H) 10/19/2010 0015   NITRITE NEGATIVE 06/21/2017 1506   LEUKOCYTESUR NEGATIVE 06/21/2017 1506   Sepsis Labs Invalid input(s): PROCALCITONIN,  WBC,  LACTICIDVEN Microbiology Recent Results (from the past 240 hour(s))  Urine culture     Status: None   Collection Time: 06/21/17  3:06 PM  Result Value Ref Range Status   Specimen Description URINE,  CLEAN CATCH  Final   Special Requests Normal  Final   Culture   Final    NO GROWTH Performed at [facility], [address], [place], [place] [address]    Report Status 06/23/2017 FINAL  Final  Culture, blood (routine x 2)     Status: None (Preliminary result)   Collection Time: 06/21/17  3:15 PM  Result Value Ref Range Status   Specimen Description BLOOD LEFT ANTECUBITAL  Final   Special Requests   Final    Blood Culture adequate volume BOTTLES DRAWN AEROBIC AND ANAEROBIC   Culture   Final    NO GROWTH 2 DAYS Performed at [facility], [address], [place], [place] [address]    Report Status PENDING  Incomplete  Culture, blood (routine x 2)     Status: None (Preliminary result)   Collection Time: 06/21/17  4:10 PM  Result Value Ref Range Status   Specimen Description BLOOD RIGHT ANTECUBITAL  Final   Special Requests Blood Culture adequate volume IN PEDIATRIC BOTTLE  Final   Culture   Final    NO GROWTH 2 DAYS Performed at [facility], [address], [place], [place] [address]    Report Status PENDING  Incomplete   Time coordinating discharge: 35 minutes  SIGNED:  Merlene Laughter, DO [facility] 06/24/2017, 5:16 PM Pager [phone]  If 7PM-7AM, please contact night-coverage [email or web address] Password [email or web address]

## 2017-06-24 NOTE — H&P (View-Only) (Signed)
STROKE TEAM PROGRESS NOTE   SUBJECTIVE (INTERVAL HISTORY) No family at the bedside.  Pt is awake, alert, and follows all commands appropriately.  TEE and loop recorder insertion tomorrow.  Slight saccadic dysmetria noted in left eye while moving gaze rightward.   OBJECTIVE Temp:  [98 F (36.7 C)-99.5 F (37.5 C)] 98 F (36.7 C) (08/20 0954) Pulse Rate:  [44-68] 53 (08/20 0954) Cardiac Rhythm: Sinus bradycardia (08/20 0700) Resp:  [16-20] 16 (08/20 0954) BP: (142-172)/(62-89) 158/77 (08/20 0954) SpO2:  [95 %-98 %] 96 % (08/20 0954)  CBC:   Recent Labs Lab 06/22/17 0901 06/23/17 0332  WBC 5.0 6.2  NEUTROABS 3.0 3.5  HGB 13.1 14.2  HCT 38.4* 40.3  MCV 84.2 83.3  PLT 235 271    Basic Metabolic Panel:   Recent Labs Lab 06/22/17 0901 06/23/17 0332  NA 136 139  K 3.6 3.5  CL 104 104  CO2 26 28  GLUCOSE 168* 107*  BUN 13 11  CREATININE 1.12 1.09  CALCIUM 8.1* 8.7*  MG 1.9 2.1  PHOS 3.3 3.5    Lipid Panel:     Component Value Date/Time   CHOL 168 06/22/2017 0347   TRIG 130 06/22/2017 0347   HDL 30 (L) 06/22/2017 0347   CHOLHDL 5.6 06/22/2017 0347   VLDL 26 06/22/2017 0347   LDLCALC 112 (H) 06/22/2017 0347   HgbA1c:  Lab Results  Component Value Date   HGBA1C 6.1 (H) 06/22/2017   Urine Drug Screen:     Component Value Date/Time   LABOPIA NONE DETECTED 06/22/2017 2323   COCAINSCRNUR NONE DETECTED 06/22/2017 2323   LABBENZ NONE DETECTED 06/22/2017 2323   AMPHETMU NONE DETECTED 06/22/2017 2323   THCU NONE DETECTED 06/22/2017 2323   LABBARB NONE DETECTED 06/22/2017 2323    Alcohol Level     Component Value Date/Time   [facility] <11 10/20/2012 2113    IMAGING  Dg Chest 2 View 06/21/2017 Chronic basilar scarring or mild basilar atelectasis. No active disease otherwise.   Ct Head Wo Contrast 06/21/2017 Newly seen low-density in the left caudate head and putamen on the left consistent with late acute / early subacute infarction. No hemorrhage.   MR  Brain Wo Contrast  06/22/2017 1. Acute perforator infarct affecting the left striatum and internal capsule anterior limb. Small acute white matter infarct also in the MCA territory. 2. There are remote ischemic injuries along the left MCA watershed territory. Left M2 flow voids are less apparent, both new from 2017. Recommend CTA characterization, a proximal stenosis is suspected.  CTA H&N 06/22/2017 1. 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 There are acute and chronic infarcts in the left MCA distribution seen on previous brain MRI, all new since 12/05/2015. 2. Atherosclerotic irregularity of medium size vessels, most prominent in the bilateral PCA distribution. P2 segment stenoses are moderate to advanced, greater on the right. 3. Minimal if any atheromatous changes in the neck. No embolic source identified.  LE venous Doppler 06/23/2017 There is no evidence of deep or superficial vein thrombosis involving the right and left lower extremities. All visualized vessels appear patent and compressible. There is no evidence of Baker's cysts bilaterally.  2-D Echo 06/23/2017 Study Conclusions - Left ventricle: The cavity size was normal. Wall thickness was   increased in a pattern of mild LVH. Systolic function was normal.  The estimated ejection fraction was in the range of 60% to 65%.  Wall motion was normal; there  were no regional wall motion abnormalities. Doppler parameters are consistent with abnormal left ventricular relaxation (grade 1 diastolic dysfunction). - Mitral valve: Calcified annulus. Impressions: - Normal LV systolic function; mild diastolic dysfunction; mild LVH.  TEE  06/24/2017 pending   PHYSICAL EXAM Vitals:   06/22/17 2207 06/23/17 0039 06/23/17 0452 06/23/17 0954  BP:  (!) 169/77 (!) 172/74 (!) 158/77  Pulse: 68 (!) 44 (!) 44 (!) 53  Resp:  20 20 16   Temp:  98.4 F (36.9 C) 98.7 F (37.1 C) 98 F (36.7  C)  TempSrc:  Axillary Oral Oral  SpO2:  97% 97% 96%  Weight:      Height:        Temp:  [98 F (36.7 C)-99.5 F (37.5 C)] 98 F (36.7 C) (08/20 0954) Pulse Rate:  [44-68] 53 (08/20 0954) Resp:  [16-20] 16 (08/20 0954) BP: (142-172)/(62-89) 158/77 (08/20 0954) SpO2:  [95 %-98 %] 96 % (08/20 0954)  General - Well nourished, well developed, in no apparent distress.  Ophthalmologic - Fundi not visualized due to noncooperation.  Cardiovascular - Regular rate and rhythm.  Mental Status -  Level of arousal and orientation to time, place, and person were intact. Language including expression, naming, repetition, comprehension was assessed and found intact. Mild psychomotor slowing. Fund of Knowledge was assessed and was intact.  Cranial Nerves II - XII - II - Visual field intact OU. III, IV, VI - Extraocular movements intact. V - Facial sensation intact bilaterally. VII - right nasolabial fold flattening. VIII - Hearing & vestibular intact bilaterally. X - Palate elevates symmetrically. XI - Chin turning & shoulder shrug intact bilaterally. XII - Tongue protrusion intact.  Motor Strength - The patient's strength was normal in all extremities and pronator drift was absent.  Bulk was normal and fasciculations were absent.   Motor Tone - Muscle tone was assessed at the neck and appendages and was normal.  Reflexes - The patient's reflexes were 1+ in all extremities and he had no pathological reflexes.  Sensory - Light touch, temperature/pinprick were assessed and were symmetrical.    Coordination - The patient had normal movements in the hands with no ataxia or dysmetria.  Tremor was absent.  Gait and Station - deferred.   ASSESSMENT/PLAN [name] is a 66 y.o. male with history of obstructive sleep apnea, migraines, hypertension, diabetes mellitus, recent ear infection, recent gallbladder surgery, and recent pneumonia presenting with confusion and speech  difficulties. He did not receive IV t-PA due to late presentation.  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 DDx may includes A. fib, PFO with DVT, or endocarditis due to recent infections.   Resultant  psychomotor slowing, right nasolabial fold flattening  CT head - acute infarct in the left caudate head and putamen on the left.  MRI head - Acute infarct affecting the left striatum and internal capsule anterior limb. Patchy left parietal cortical infarct.  CTA H&N - occlusion or pre-occlusive stenosis of the left M1 segment, suspicious for thrombus  LE venous doppler - No DVT  2D Echo - EF 60-65%. No source of embolus    Recommend TEE and recorder for further embolic workup to rule out endocarditis, PFO, or A. fib  LDL - 112  HgbA1c - 6.1  VTE prophylaxis - Lovenox Diet heart healthy/carb modified Room service appropriate? Yes; Fluid consistency: Thin Diet NPO time specified  No antithrombotic prior to admission, now on aspirin 325 mg daily and plavix 75mg . Continue DAPT for 3 months and the plavix alone due to intracranial atherosclerosis   Patient counseled to be compliant with his antithrombotic medications  Ongoing aggressive stroke risk factor management  Therapy recommendations: PT evaluation pending  Disposition: Pending  Left MCA occlusion  Concerning for embolic source  CTA neck no ICA stenosis or plaque   Further embolic work up pending  BP goal 130-150   Hypertension  Stable - on multiple antihypertensive medications.  Permissive hypertension (OK if < 220/120) but gradually normalize in 5-7 days  Long-term BP goal 130-150 due to left M1 occlusion  Hyperlipidemia  Home meds: No lipid lowering medications prior to admission.  LDL 112, goal < 70  Now on Lipitor 20 mg daily  Continue statin at discharge  Diabetes  HgbA1c 6.1, goal <  7.0  Controlled  SSI  CBG monitoring  Tobacco abuse  Current smoker  Smoking cessation counseling provided  Pt is willing to quit  Other Stroke Risk Factors  Advanced age  Obesity, Body mass index is 38.6 kg/m., recommend weight loss, diet and exercise as appropriate   Migraines  Obstructive sleep apnea  Other Active Problems  Chronic vertigo - following with ENT - on low dose Dilantin and propranolol    right hearing loss after ear infection - May consider ENT consultation    recent gallbladder surgery followed by pneumonia   Hospital day # 2  Plan await TEE and  loop recorder tomorrow. Discussed with Dr. Junie Panning. 06/23/2017 4:28 PM    To contact Stroke Continuity provider, please refer to [email or web address]. After hours, contact General Neurology

## 2017-06-24 NOTE — Progress Notes (Signed)
Patient given discharge instructions with family at bedside.  All questions and concerns addressed.  Patient taken to private vehicle in wheelchair accompanied by staff.  All belongings with patient.

## 2017-06-24 NOTE — Interval H&P Note (Signed)
History and Physical Interval Note:  06/24/2017 8:40 AM  Raymond [name]  has presented today for surgery, with the diagnosis of stroke  The various methods of treatment have been discussed with the patient and family. After consideration of risks, benefits and other options for treatment, the patient has consented to  Procedure(s): TRANSESOPHAGEAL ECHOCARDIOGRAM (TEE) WITH LOOP (N/A) as a surgical intervention .  The patient's history has been reviewed, patient examined, no change in status, stable for surgery.  I have reviewed the patient's chart and labs.  Questions were answered to the patient's satisfaction.     Kristeen Miss

## 2017-06-24 NOTE — Care Management Note (Signed)
Case Management Note  Patient Details  Name: Raymond [name] MRN: [id number] Date of Birth: 11-07-1950  Subjective/Objective:                    Action/Plan: Pt will d/c home with spouse. No f/u and no DME per PT/OT. He has PCP, insurance and transportation home. No further needs per CM.   Expected Discharge Date:                  Expected Discharge Plan:  Home/Self Care  In-House Referral:     Discharge planning Services     Post Acute Care Choice:    Choice offered to:     DME Arranged:    DME Agency:     HH Arranged:    HH Agency:     Status of Service:  Completed, signed off  If discussed at [employer] of Stay Meetings, dates discussed:    Additional Comments:  Kermit Balo, RN 06/24/2017, 1:18 PM

## 2017-06-24 NOTE — CV Procedure (Signed)
    Transesophageal Echocardiogram Note  Raymond [name] [id number] 1951/04/14  Procedure: Transesophageal Echocardiogram Indications: CVA   Procedure Details Consent: Obtained Time Out: Verified patient identification, verified procedure, site/side was marked, verified correct patient position, special equipment/implants available, Radiology Safety Procedures followed,  medications/allergies/relevent history reviewed, required imaging and test results available.  Performed  Medications:  During this procedure the patient is administered a total of Versed 4 mg and Fentanyl 75 mcg  to achieve and maintain moderate conscious sedation.  The patient's heart rate, blood pressure, and oxygen saturation are monitored continuously during the procedure. The period of conscious sedation is 30  minutes, of which I was present face-to-face 100% of this time.  Left Ventrical:  Mild LV dysfunction - EF 45%   Mitral Valve: mild MR   Aortic Valve: normal AV   Tricuspid Valve: normal   Pulmonic Valve: normal   Left Atrium/ Left atrial appendage: small, no thrombi   Atrial septum: intact by color flow and bubble contrast   Aorta: minimal plaque,  Essentially normal    Complications: No apparent complications Patient did tolerate procedure well.   Raymond [name], Montez Hageman., MD, [facility] 06/24/2017, 9:39 AM

## 2017-06-25 ENCOUNTER — Encounter (HOSPITAL_COMMUNITY): Payer: Self-pay | Admitting: Internal Medicine

## 2017-06-27 LAB — CULTURE, BLOOD (ROUTINE X 2)
CULTURE: NO GROWTH
Culture: NO GROWTH
SPECIAL REQUESTS: ADEQUATE
SPECIAL REQUESTS: ADEQUATE

## 2017-06-30 ENCOUNTER — Telehealth: Payer: Self-pay | Admitting: Neurology

## 2017-06-30 NOTE — Telephone Encounter (Signed)
RN call patients wife [name] about husband needing a sleep consult. Pt was already seeing a neurologist in [place] for his sleep apnea issues, and wears a cipap machine. The machine is old per his wife,and he needs a new one. Rn stated a message will be sent to Dr. Pearlean Brownie about putting order in for sleep consult. Pts wife verbalized understanding.

## 2017-06-30 NOTE — Telephone Encounter (Signed)
The pt's wife call me and stated the pt seen Dr. Pearlean Brownie while he was in the hospital and Dr. Pearlean Brownie was suppose to put in a referral for the pt to have a sleep study due to the pt having a stroke. There hasn't been a referral put in on the pt. Is there anyway you can find out about this for me

## 2017-07-02 ENCOUNTER — Other Ambulatory Visit: Payer: Self-pay | Admitting: Neurology

## 2017-07-02 ENCOUNTER — Ambulatory Visit (INDEPENDENT_AMBULATORY_CARE_PROVIDER_SITE_OTHER): Payer: Self-pay | Admitting: *Deleted

## 2017-07-02 DIAGNOSIS — G4733 Obstructive sleep apnea (adult) (pediatric): Secondary | ICD-10-CM

## 2017-07-02 DIAGNOSIS — I639 Cerebral infarction, unspecified: Secondary | ICD-10-CM

## 2017-07-02 LAB — CUP PACEART INCLINIC DEVICE CHECK
Date Time Interrogation Session: 20180829165132
MDC IDC PG IMPLANT DT: 20180822

## 2017-07-02 NOTE — Progress Notes (Signed)
amb ref sleep

## 2017-07-02 NOTE — Progress Notes (Signed)
Wound check appointment. Steri-strips removed. Wound without redness or edema. Incision edges approximated, wound well healed. Normal device function. Battery status: good. R-waves 0.8926mV. No symptom, tachy, or AF episodes. Pause and brady detection remain off. Patient educated about wound care and Carelink monitor. Monthly summary reports and ROV with JA PRN.

## 2017-07-02 NOTE — Telephone Encounter (Signed)
Ok will do so 

## 2017-07-03 ENCOUNTER — Ambulatory Visit: Payer: Self-pay

## 2017-07-10 ENCOUNTER — Encounter (HOSPITAL_COMMUNITY): Payer: Self-pay | Admitting: *Deleted

## 2017-07-10 ENCOUNTER — Emergency Department (HOSPITAL_COMMUNITY): Payer: Medicare Other

## 2017-07-10 ENCOUNTER — Observation Stay (HOSPITAL_COMMUNITY): Payer: Medicare Other

## 2017-07-10 ENCOUNTER — Observation Stay (HOSPITAL_COMMUNITY)
Admission: EM | Admit: 2017-07-10 | Discharge: 2017-07-11 | Disposition: A | Discharge status: Home / Self Care | Payer: Medicare Other | Attending: Internal Medicine | Admitting: Internal Medicine

## 2017-07-10 ENCOUNTER — Telehealth: Payer: Self-pay | Admitting: Neurology

## 2017-07-10 DIAGNOSIS — Z791 Long term (current) use of non-steroidal anti-inflammatories (NSAID): Secondary | ICD-10-CM | POA: Insufficient documentation

## 2017-07-10 DIAGNOSIS — Z7984 Long term (current) use of oral hypoglycemic drugs: Secondary | ICD-10-CM | POA: Diagnosis not present

## 2017-07-10 DIAGNOSIS — Z7902 Long term (current) use of antithrombotics/antiplatelets: Secondary | ICD-10-CM | POA: Insufficient documentation

## 2017-07-10 DIAGNOSIS — Z79899 Other long term (current) drug therapy: Secondary | ICD-10-CM | POA: Insufficient documentation

## 2017-07-10 DIAGNOSIS — E669 Obesity, unspecified: Secondary | ICD-10-CM | POA: Diagnosis not present

## 2017-07-10 DIAGNOSIS — H832X9 Labyrinthine dysfunction, unspecified ear: Secondary | ICD-10-CM | POA: Diagnosis not present

## 2017-07-10 DIAGNOSIS — G4733 Obstructive sleep apnea (adult) (pediatric): Secondary | ICD-10-CM | POA: Insufficient documentation

## 2017-07-10 DIAGNOSIS — I10 Essential (primary) hypertension: Secondary | ICD-10-CM | POA: Diagnosis not present

## 2017-07-10 DIAGNOSIS — I639 Cerebral infarction, unspecified: Secondary | ICD-10-CM

## 2017-07-10 DIAGNOSIS — R4701 Aphasia: Secondary | ICD-10-CM | POA: Diagnosis not present

## 2017-07-10 DIAGNOSIS — E119 Type 2 diabetes mellitus without complications: Secondary | ICD-10-CM | POA: Diagnosis not present

## 2017-07-10 DIAGNOSIS — Z888 Allergy status to other drugs, medicaments and biological substances status: Secondary | ICD-10-CM | POA: Insufficient documentation

## 2017-07-10 DIAGNOSIS — E1169 Type 2 diabetes mellitus with other specified complication: Secondary | ICD-10-CM | POA: Diagnosis not present

## 2017-07-10 DIAGNOSIS — Z7982 Long term (current) use of aspirin: Secondary | ICD-10-CM | POA: Insufficient documentation

## 2017-07-10 DIAGNOSIS — G8191 Hemiplegia, unspecified affecting right dominant side: Principal | ICD-10-CM | POA: Insufficient documentation

## 2017-07-10 DIAGNOSIS — Z8673 Personal history of transient ischemic attack (TIA), and cerebral infarction without residual deficits: Secondary | ICD-10-CM | POA: Insufficient documentation

## 2017-07-10 DIAGNOSIS — Z9889 Other specified postprocedural states: Secondary | ICD-10-CM | POA: Insufficient documentation

## 2017-07-10 DIAGNOSIS — R42 Dizziness and giddiness: Secondary | ICD-10-CM | POA: Diagnosis not present

## 2017-07-10 DIAGNOSIS — F329 Major depressive disorder, single episode, unspecified: Secondary | ICD-10-CM | POA: Insufficient documentation

## 2017-07-10 DIAGNOSIS — Z87891 Personal history of nicotine dependence: Secondary | ICD-10-CM | POA: Insufficient documentation

## 2017-07-10 DIAGNOSIS — R531 Weakness: Secondary | ICD-10-CM

## 2017-07-10 DIAGNOSIS — Z9049 Acquired absence of other specified parts of digestive tract: Secondary | ICD-10-CM | POA: Diagnosis not present

## 2017-07-10 DIAGNOSIS — R001 Bradycardia, unspecified: Secondary | ICD-10-CM | POA: Diagnosis not present

## 2017-07-10 DIAGNOSIS — H8390 Unspecified disease of inner ear, unspecified ear: Secondary | ICD-10-CM

## 2017-07-10 DIAGNOSIS — E785 Hyperlipidemia, unspecified: Secondary | ICD-10-CM | POA: Diagnosis not present

## 2017-07-10 DIAGNOSIS — Z6838 Body mass index (BMI) 38.0-38.9, adult: Secondary | ICD-10-CM | POA: Diagnosis not present

## 2017-07-10 LAB — COMPREHENSIVE METABOLIC PANEL
ALK PHOS: 32 U/L — AB (ref 38–126)
ALT: 22 U/L (ref 17–63)
ANION GAP: 8 (ref 5–15)
AST: 20 U/L (ref 15–41)
Albumin: 3.5 g/dL (ref 3.5–5.0)
BILIRUBIN TOTAL: 0.9 mg/dL (ref 0.3–1.2)
BUN: 19 mg/dL (ref 6–20)
CALCIUM: 8.6 mg/dL — AB (ref 8.9–10.3)
CO2: 25 mmol/L (ref 22–32)
CREATININE: 1.1 mg/dL (ref 0.61–1.24)
Chloride: 104 mmol/L (ref 101–111)
GFR calc non Af Amer: >60 mL/min (ref >60)
GLUCOSE: 169 mg/dL — AB (ref 65–99)
Potassium: 3.7 mmol/L (ref 3.5–5.1)
Sodium: 137 mmol/L (ref 135–145)
TOTAL PROTEIN: 5.9 g/dL — AB (ref 6.5–8.1)

## 2017-07-10 LAB — I-STAT CHEM 8, ED
BUN: 21 mg/dL — AB (ref 6–20)
CALCIUM ION: 1.09 mmol/L — AB (ref 1.15–1.40)
Chloride: 101 mmol/L (ref 101–111)
Creatinine, Ser: 1 mg/dL (ref 0.61–1.24)
GLUCOSE: 163 mg/dL — AB (ref 65–99)
HCT: 45 % (ref 39.0–52.0)
Hemoglobin: 15.3 g/dL (ref 13.0–17.0)
Potassium: 3.6 mmol/L (ref 3.5–5.1)
SODIUM: 139 mmol/L (ref 135–145)
TCO2: 26 mmol/L (ref 22–32)

## 2017-07-10 LAB — URINALYSIS, ROUTINE W REFLEX MICROSCOPIC
BACTERIA UA: NONE SEEN
GLUCOSE, UA: NEGATIVE mg/dL
Hgb urine dipstick: NEGATIVE
KETONES UR: 5 mg/dL — AB
Leukocytes, UA: NEGATIVE
NITRITE: NEGATIVE
PROTEIN: 30 mg/dL — AB
Specific Gravity, Urine: 1.034 — ABNORMAL HIGH (ref 1.005–1.030)
Squamous Epithelial / LPF: NONE SEEN
pH: 5 (ref 5.0–8.0)

## 2017-07-10 LAB — CBC
HEMATOCRIT: 44.1 % (ref 39.0–52.0)
Hemoglobin: 14.8 g/dL (ref 13.0–17.0)
MCH: 29 pg (ref 26.0–34.0)
MCHC: 33.6 g/dL (ref 30.0–36.0)
MCV: 86.3 fL (ref 78.0–100.0)
Platelets: 134 10*3/uL — ABNORMAL LOW (ref 150–400)
RBC: 5.11 MIL/uL (ref 4.22–5.81)
RDW: 13.7 % (ref 11.5–15.5)
WBC: 7.1 10*3/uL (ref 4.0–10.5)

## 2017-07-10 LAB — GLUCOSE, CAPILLARY: GLUCOSE-CAPILLARY: 198 mg/dL — AB (ref 65–99)

## 2017-07-10 LAB — DIFFERENTIAL
Basophils Absolute: 0 10*3/uL (ref 0.0–0.1)
Basophils Relative: 0 %
EOS PCT: 1 %
Eosinophils Absolute: 0.1 10*3/uL (ref 0.0–0.7)
LYMPHS PCT: 27 %
Lymphs Abs: 1.9 10*3/uL (ref 0.7–4.0)
MONO ABS: 0.6 10*3/uL (ref 0.1–1.0)
Monocytes Relative: 8 %
Neutro Abs: 4.6 10*3/uL (ref 1.7–7.7)
Neutrophils Relative %: 64 %

## 2017-07-10 LAB — APTT: aPTT: 25 seconds (ref 24–36)

## 2017-07-10 LAB — I-STAT TROPONIN, ED: Troponin i, poc: 0 ng/mL (ref 0.00–0.08)

## 2017-07-10 LAB — PROTIME-INR
INR: 0.98
Prothrombin Time: 12.9 seconds (ref 11.4–15.2)

## 2017-07-10 MED ORDER — NORTRIPTYLINE HCL 25 MG PO CAPS: 25.0000 mg | ORAL_CAPSULE | Freq: Every day | ORAL | Status: DC

## 2017-07-10 MED ORDER — ATORVASTATIN CALCIUM 10 MG PO TABS: 20.0000 mg | ORAL_TABLET | Freq: Every day | ORAL | Status: DC

## 2017-07-10 MED ORDER — IOPAMIDOL (ISOVUE-370) INJECTION 76%
INTRAVENOUS | Status: AC
  Filled 2017-07-10: qty 100

## 2017-07-10 MED ORDER — HYDROXYZINE HCL 25 MG PO TABS: 50.0000 mg | ORAL_TABLET | Freq: Every evening | ORAL | Status: DC | PRN

## 2017-07-10 MED ORDER — SODIUM CHLORIDE 0.9 % IV SOLN
INTRAVENOUS | Status: AC
  Administered 2017-07-10: 22:00:00 via INTRAVENOUS

## 2017-07-10 MED ORDER — INSULIN ASPART 100 UNIT/ML ~~LOC~~ SOLN
0.0000 [IU] | Freq: Every day | SUBCUTANEOUS | Status: DC
Start: 2017-07-10 — End: 2017-07-11

## 2017-07-10 MED ORDER — ACETAMINOPHEN 325 MG PO TABS: 650.0000 mg | ORAL_TABLET | ORAL | Status: DC | PRN

## 2017-07-10 MED ORDER — PROPRANOLOL HCL 10 MG PO TABS
20.0000 mg | ORAL_TABLET | Freq: Two times a day (BID) | ORAL | Status: DC
  Filled 2017-07-10: qty 2

## 2017-07-10 MED ORDER — ASPIRIN 325 MG PO TABS
325.0000 mg | ORAL_TABLET | Freq: Every day | ORAL | Status: DC
  Administered 2017-07-11: 325 mg via ORAL
  Filled 2017-07-10: qty 1

## 2017-07-10 MED ORDER — VERAPAMIL HCL ER 120 MG PO TBCR
120.0000 mg | EXTENDED_RELEASE_TABLET | Freq: Every day | ORAL | Status: DC
  Administered 2017-07-10: 120 mg via ORAL
  Filled 2017-07-10 (×2): qty 1

## 2017-07-10 MED ORDER — IOPAMIDOL (ISOVUE-370) INJECTION 76%
INTRAVENOUS | Status: AC
  Administered 2017-07-10: 50 mL
  Filled 2017-07-10: qty 50

## 2017-07-10 MED ORDER — ENOXAPARIN SODIUM 40 MG/0.4ML ~~LOC~~ SOLN
40.0000 mg | SUBCUTANEOUS | Status: DC
  Administered 2017-07-10: 40 mg via SUBCUTANEOUS
  Filled 2017-07-10: qty 0.4

## 2017-07-10 MED ORDER — SENNOSIDES-DOCUSATE SODIUM 8.6-50 MG PO TABS
1.0000 | ORAL_TABLET | Freq: Every evening | ORAL | Status: DC | PRN
Start: 2017-07-10 — End: 2017-07-11

## 2017-07-10 MED ORDER — CITALOPRAM HYDROBROMIDE 40 MG PO TABS: 40.0000 mg | ORAL_TABLET | Freq: Every day | ORAL | Status: DC

## 2017-07-10 MED ORDER — ACETAMINOPHEN 160 MG/5ML PO SOLN: 650.0000 mg | ORAL | Status: DC | PRN

## 2017-07-10 MED ORDER — STROKE: EARLY STAGES OF RECOVERY BOOK
Freq: Once | Status: AC
  Administered 2017-07-10: 22:00:00
  Filled 2017-07-10: qty 1

## 2017-07-10 MED ORDER — PHENYTOIN SODIUM EXTENDED 100 MG PO CAPS
100.0000 mg | ORAL_CAPSULE | Freq: Two times a day (BID) | ORAL | Status: DC
  Administered 2017-07-11: 100 mg via ORAL
  Filled 2017-07-10 (×2): qty 1

## 2017-07-10 MED ORDER — CLOPIDOGREL BISULFATE 75 MG PO TABS
75.0000 mg | ORAL_TABLET | Freq: Every day | ORAL | Status: DC
  Administered 2017-07-11: 75 mg via ORAL
  Filled 2017-07-10: qty 1

## 2017-07-10 MED ORDER — INSULIN ASPART 100 UNIT/ML ~~LOC~~ SOLN
0.0000 [IU] | Freq: Three times a day (TID) | SUBCUTANEOUS | Status: DC
  Administered 2017-07-11: 1 [IU] via SUBCUTANEOUS
  Administered 2017-07-11: 2 [IU] via SUBCUTANEOUS

## 2017-07-10 MED ORDER — ACETAMINOPHEN 650 MG RE SUPP: 650.0000 mg | RECTAL | Status: DC | PRN

## 2017-07-10 NOTE — Progress Notes (Signed)
RT placed pt on cpap. Pt tolerating well, RT will continue to monitor.

## 2017-07-10 NOTE — ED Notes (Signed)
Patient transported to MRI 

## 2017-07-10 NOTE — Telephone Encounter (Signed)
Pt wife is asking for a call re: state of pt.  She stated while in the hospital there was no weakness on either side, no weakness of the right arm but cognitive issues at that time and now.  Wife states that now pt is complaining of weakness of right arm and she has noticed tremors in that arm as well.  When asked if she feels he needs to go to ED she stated not that bad but that he mentions it from time to time.  Wife would like a call back and again was advised to use ED when neeed

## 2017-07-10 NOTE — ED Notes (Signed)
Patient transported to CT 

## 2017-07-10 NOTE — ED Provider Notes (Signed)
[facility]-EMERGENCY DEPT Provider Note   CSN: [id number] Arrival date & time: 07/10/17  1235     History   Chief Complaint Chief Complaint  Patient presents with  . Extremity Weakness    HPI Raymond [name] is a 66 y.o. male.  The history is provided by the patient and the spouse.  Weakness  Primary symptoms include focal weakness. This is a new problem. The current episode started 6 to 12 hours ago. The problem has not changed since onset.There was right upper extremity focality noted. There has been no fever. Pertinent negatives include no shortness of breath, no chest pain, no vomiting, no altered mental status, no confusion and no headaches. There were no medications administered prior to arrival. Associated medical issues include CVA (recently discharged from the hospital after having a left MCA/internal capsule CVA. DC'd on ASA and plavix). Associated medical issues do not include trauma, a bleeding disorder, seizures or a clotting disorder.   Patient denies any fevers, URI symptoms, cough, nausea, vomiting, chest pain, shortness of breath, abdominal pain, diarrhea, dysuria. Patient has been hydrating well.   Past Medical History:  Diagnosis Date  . Arthritis   . Depression   . Diabetes mellitus type 2 in obese (HCC)   . Hypertension   . Migraines   . Sleep apnea     Patient Active Problem List   Diagnosis Date Noted  . Depression 06/22/2017  . Diabetes mellitus type 2 in obese (HCC) 06/22/2017  . Inner ear dysfunction 06/22/2017  . Essential hypertension 06/22/2017  . OSA on CPAP 06/22/2017  . Expressive aphasia 06/22/2017  . Stroke (HCC) 06/21/2017  . Cryptogenic stroke (HCC) 06/21/2017  . Major depressive disorder, recurrent (HCC) 10/21/2012    Past Surgical History:  Procedure Laterality Date  . CHOLECYSTECTOMY    . KNEE ARTHROSCOPY    . LOOP RECORDER INSERTION N/A 06/24/2017   Procedure: LOOP RECORDER INSERTION;  Surgeon: Hillis RangeAllred, James, MD;  Location: [facility]  INVASIVE CV LAB;  Service: Cardiovascular;  Laterality: N/A;  . ROTATOR CUFF REPAIR    . TEE WITHOUT CARDIOVERSION N/A 06/24/2017   Procedure: TRANSESOPHAGEAL ECHOCARDIOGRAM (TEE) WITH LOOP;  Surgeon: Elease HashimotoNahser, Deloris PingPhilip J, MD;  [facility];  Service: Cardiovascular;  Laterality: N/A;       Home Medications    Prior to Admission medications   Medication Sig Start Date End Date Taking? Authorizing Provider  acetaminophen (TYLENOL) 500 MG tablet Take 2 tablets (1,000 mg total) by mouth every 6 (six) hours as needed. Patient taking differently: Take 1,000 mg by mouth every 6 (six) hours as needed for mild pain.  04/23/16   Arby BarrettePfeiffer, Marcy, MD  aspirin (BAYER ASPIRIN) 325 MG tablet Take 1 tablet (325 mg total) by mouth daily. 06/24/17 06/24/18  Marguerita MerlesSheikh, Omair Latif, DO  atorvastatin (LIPITOR) 20 MG tablet Take 1 tablet (20 mg total) by mouth daily at 6 PM. 06/24/17   Marguerita MerlesSheikh, Omair Latif, DO  citalopram (CELEXA) 40 MG tablet Take 1 tablet (40 mg total) by mouth daily. 01/08/16   Arfeen, Phillips GroutSyed T, MD  clopidogrel (PLAVIX) 75 MG tablet Take 1 tablet (75 mg total) by mouth daily. 06/24/17   Sheikh, Omair Latif, DO  guaiFENesin-codeine 100-10 MG/5ML syrup Take 5 mLs by mouth every 6 (six) hours as needed for cough. 06/14/17   Mackuen, Courteney Lyn, MD  hydrOXYzine (ATARAX/VISTARIL) 50 MG tablet Take 1 tablet (50 mg total) by mouth at bedtime as needed for anxiety. 01/08/16   Arfeen, Phillips GroutSyed T, MD  metFORMIN (  GLUCOPHAGE) 500 MG tablet Take 500 mg by mouth at bedtime.  11/21/15   [provider]  naproxen (NAPROSYN) 500 MG tablet Take 1 tablet (500 mg total) by mouth 3 (three) times daily as needed. Patient taking differently: Take 500 mg by mouth 3 (three) times daily as needed for mild pain.  10/26/12   Charm Rings, NP  nortriptyline (PAMELOR) 25 MG capsule Take 1 capsule (25 mg total) by mouth at bedtime. 01/08/16   Arfeen, Phillips Grout, MD  phenytoin (DILANTIN) 100 MG ER capsule Take 100 mg by mouth 2  (two) times daily.    [provider]  propranolol (INDERAL) 20 MG tablet Take 20 mg by mouth 2 (two) times daily.    [provider]  senna-docusate (SENOKOT-S) 8.6-50 MG tablet Take 1 tablet by mouth at bedtime as needed for mild constipation. 06/24/17   Marguerita Merles Latif, DO  verapamil (CALAN-SR) 120 MG CR tablet Take 120 mg by mouth at bedtime.  11/11/14   [provider]    Family History Family History  Problem Relation Age of Onset  . Other Mother        reports in good health  . Other Father        was estranged from family, unknown medical history    Social History Social History  Substance Use Topics  . Smoking status: Light Tobacco Smoker    Types: Cigars  . Smokeless tobacco: Never Used  . Alcohol use No     Allergies   Flomax [tamsulosin hcl]   Review of Systems Review of Systems  Respiratory: Negative for shortness of breath.   Cardiovascular: Negative for chest pain.  Gastrointestinal: Negative for vomiting.  Neurological: Positive for focal weakness and weakness. Negative for headaches.  Psychiatric/Behavioral: Negative for confusion.  All other systems are reviewed and are negative for acute change except as noted in the HPI    Physical Exam Updated Vital Signs BP (!) 148/83 (BP Location: Left Arm)   Pulse (!) 47   Temp 97.8 F (36.6 C)   Resp 18   SpO2 99%   Physical Exam  Constitutional: He is oriented to person, place, and time. He appears well-developed and well-nourished. No distress.  HENT:  Head: Normocephalic and atraumatic.  Nose: Nose normal.  Eyes: Pupils are equal, round, and reactive to light. Conjunctivae and EOM are normal. Right eye exhibits no discharge. Left eye exhibits no discharge. No scleral icterus.  Neck: Normal range of motion. Neck supple.  Cardiovascular: Normal rate and regular rhythm.  Exam reveals no gallop and no friction rub.   No murmur heard. Pulmonary/Chest: Effort normal and breath  sounds normal. No stridor. No respiratory distress. He has no rales.  Abdominal: Soft. He exhibits no distension. There is no tenderness.  Musculoskeletal: He exhibits no edema or tenderness.  Neurological: He is alert and oriented to person, place, and time.  Mental Status: Alert and oriented to person, place, and time. Attention and concentration normal. Speech slowed. Recent memory is intact  Cranial Nerves  II Visual Fields: Intact to confrontation. Visual fields intact. III, IV, VI: Pupils equal and reactive to light and near. Full eye movement without nystagmus  V Facial Sensation: Normal. No weakness of masticatory muscles  VII: No facial weakness or asymmetry  VIII Auditory Acuity: decreased hearing IX/X: The uvula is midline; the palate elevates symmetrically  XI: Normal sternocleidomastoid and trapezius strength  XII: The tongue is midline. No atrophy or fasciculations.  Motor System: Muscle Strength: 4/5 In the right upper and lower extremities. 5/5 in the left upper and lower extremities. No pronation or drift.  Muscle Tone: Tone and muscle bulk are normal in the upper and lower extremities.   Reflexes: DTRs: 1+ and symmetrical in all four extremities. Plantar responses are flexor bilaterally.  Coordination: Intact finger-to-nose, heel-to-shin. essential tremor BUE  Sensation: Intact to light touch, and pinprick.  Gait: deferred   Skin: Skin is warm and dry. No rash noted. He is not diaphoretic. No erythema.  Psychiatric: He has a normal mood and affect.  Vitals reviewed.    ED Treatments / Results  Labs (all labs ordered are listed, but only abnormal results are displayed) Labs Reviewed  CBC - Abnormal; Notable for the following:       Result Value   Platelets 134 (*)    All other components within normal limits  COMPREHENSIVE METABOLIC PANEL - Abnormal; Notable for the following:    Glucose, Bld 169 (*)    Calcium 8.6 (*)    Total Protein 5.9 (*)    Alkaline  Phosphatase 32 (*)    All other components within normal limits  URINALYSIS, ROUTINE W REFLEX MICROSCOPIC - Abnormal; Notable for the following:    Color, Urine AMBER (*)    Specific Gravity, Urine 1.034 (*)    Bilirubin Urine SMALL (*)    Ketones, ur 5 (*)    Protein, ur 30 (*)    All other components within normal limits  I-STAT CHEM 8, ED - Abnormal; Notable for the following:    BUN 21 (*)    Glucose, Bld 163 (*)    Calcium, Ion 1.09 (*)    All other components within normal limits  PROTIME-INR  APTT  DIFFERENTIAL  I-STAT TROPONIN, ED  CBG MONITORING, ED    EKG  EKG Interpretation  Date/Time:  Thursday July 10 2017 12:39:48 EDT Ventricular Rate:  47 PR Interval:  162 QRS Duration: 100 QT Interval:  452 QTC Calculation: 400 R Axis:   -4 Text Interpretation:  Sinus bradycardia Possible Anterior infarct , age undetermined Abnormal ECG No significant change since last tracing Confirmed by Drema Pry [phone]) on 07/10/2017 3:32:55 PM       Radiology Ct Head Wo Contrast  Result Date: 07/10/2017 CLINICAL DATA:  Right arm weakness. EXAM: CT HEAD WITHOUT CONTRAST TECHNIQUE: Contiguous axial images were obtained from the base of the skull through the vertex without intravenous contrast. COMPARISON:  CT scan of June 21, 2017. FINDINGS: Brain: Old infarction is seen involving the left basal ganglia. No mass effect or midline shift is noted. Ventricular size is within normal limits. There is no evidence of mass lesion, hemorrhage or acute infarction. Vascular: No hyperdense vessel or unexpected calcification. Skull: Normal. Negative for fracture or focal lesion. Sinuses/Orbits: No acute finding. Other: None. IMPRESSION: No acute intracranial abnormality seen. Electronically Signed   By: Lupita Raider, M.D.   On: 07/10/2017 14:35    Procedures Procedures (including critical care time)  Medications Ordered in ED Medications - No data to display   Initial Impression /  Assessment and Plan / ED Course  I have reviewed the triage vital signs and the nursing notes.  Pertinent labs & imaging results that were available during my care of the patient were reviewed by me and considered in my medical decision making (see chart for details).     Recent left internal capsule and MCA CVA here with 2 days of  new onset right upper extremity weakness, and recurrent expressive aphasia. On review of records, patient did not have any extremity weakness during his recent CVA. No evidence of recent infection. Labs without electrolyte derangements or other metabolic processes. Discussed case with Dr. Wilford Corner, who will evaluate the patient in the emergency department and requested hospitalist admission with repeat MRI, CTA, and Loop recorder interrogation.  Was discussed case with the hospitalist for admission.  Final Clinical Impressions(s) / ED Diagnoses   Final diagnoses:  Weakness      Cardama, Amadeo Garnet, MD 07/10/17 1729

## 2017-07-10 NOTE — ED Notes (Signed)
Pt is eating dinner.  Denies any complaints

## 2017-07-10 NOTE — Telephone Encounter (Signed)
Rn call patient and spoke with him about his tremor, and weakness in right arm. Pt stated her prefer if I speak with his wife. Rn ask to speak with wife,but pt stated she was not at home.Rn call patients wife Junious DresserConnie on her cell phone  about her husband new symtps.Junious DresserConnie stated pt has been complaining about weakness in right arm,and has had tremors at times. Junious DresserConnie wanted to move up pts appt from 07/30/2017. RN advised Junious DresserConnie that if this is a new sympt to seek the nearest ED to get a work up. Junious DresserConnie stated [facility]. Junious DresserConnie will call us back for updates.

## 2017-07-10 NOTE — Consult Note (Addendum)
Neurology Consultation  Reason for Consult: Right sided weakness, word finding difficulty Referring Physician: Dr Eudelia Bunch (EDP)  CC: Worsening aphasia and right-sided weakness.  History is obtained from: Patient, chart, patient's wife  HPI: Raymond [name] is a 66 y.o. male who has a past medical history of left MCA stroke with residual mild aphasia, was in his usual state of health  up until 5 PM yesterday when he started noticing some right-sided weakness. He had a left MCA stroke with complaints of aphasia but no weakness at the time in August. His hospital course was complicated by a cholecystitis which needed a cholecystectomy and also by some urinary infection, which has caused him to have reduced hearing in his right ear. The wife also noticed that he has been having word finding difficulty ever since yesterday and does not sound like himself. There is no preceding illnesses. No fevers chills. No chest pain or shortness of breath. No nausea or vomiting. No visual symptoms. No headaches.  He was discharged on dual antiplatelets and statins last time and is compliant with medications. His wife reports easy bleeding ever since he is been on dual antiplatelet. He also got a TEE done last time and a loop recorder inserted for evaluation of a possible embolic source for his stroke although looking at his blood vessels it does look like that might not be a clot but a stenosis.   LKW: 5 PM on 07/09/2017 tpa given?: no, outside the window Premorbid modified Rankin scale (mRS):  1  ROS: A 14 point ROS was performed and is negative except as noted in the HPI.   Past Medical History:  Diagnosis Date  . Arthritis   . Depression   . Diabetes mellitus type 2 in obese (HCC)   . Hypertension   . Migraines   . Sleep apnea     Family History  Problem Relation Age of Onset  . Other Mother        reports in good health  . Other Father        was estranged from family, unknown medical history     Social History:   reports that he has been smoking Cigars.  He has never used smokeless tobacco. He reports that he does not drink alcohol or use drugs.  Medications No current facility-administered medications for this encounter.   Current Outpatient Prescriptions:  .  acetaminophen (TYLENOL) 500 MG tablet, Take 2 tablets (1,000 mg total) by mouth every 6 (six) hours as needed. (Patient taking differently: Take 1,000 mg by mouth every 6 (six) hours as needed for mild pain. ), Disp: 30 tablet, Rfl: 0 .  aspirin (BAYER ASPIRIN) 325 MG tablet, Take 1 tablet (325 mg total) by mouth daily., Disp: 360 tablet, Rfl: 0 .  atorvastatin (LIPITOR) 20 MG tablet, Take 1 tablet (20 mg total) by mouth daily at 6 PM., Disp: 30 tablet, Rfl: 0 .  clopidogrel (PLAVIX) 75 MG tablet, Take 1 tablet (75 mg total) by mouth daily., Disp: 30 tablet, Rfl: 0 .  losartan-hydrochlorothiazide (HYZAAR) 100-25 MG tablet, Take 0.5 tablets by mouth daily., Disp: , Rfl:  .  metFORMIN (GLUCOPHAGE) 500 MG tablet, Take 500 mg by mouth at bedtime. , Disp: , Rfl:  .  phenytoin (DILANTIN) 100 MG ER capsule, Take 100 mg by mouth 2 (two) times daily., Disp: , Rfl:  .  propranolol (INDERAL) 20 MG tablet, Take 20 mg by mouth 2 (two) times daily., Disp: , Rfl:  .  senna-docusate (  SENOKOT-S) 8.6-50 MG tablet, Take 1 tablet by mouth at bedtime as needed for mild constipation., Disp: 30 tablet, Rfl: 0 .  verapamil (CALAN-SR) 120 MG CR tablet, Take 60 mg by mouth at bedtime. , Disp: , Rfl:   Exam: Current vital signs: BP (!) 142/70   Pulse (!) 49   Temp 98.7 F (37.1 C)   Resp (!) 23   SpO2 98%  Vital signs in last 24 hours: Temp:  [97.8 F (36.6 C)-98.7 F (37.1 C)] 98.7 F (37.1 C) (09/06 1820) Pulse Rate:  [44-49] 49 (09/06 1830) Resp:  [16-23] 23 (09/06 1830) BP: (142-156)/(70-89) 142/70 (09/06 1800) SpO2:  [97 %-99 %] 98 % (09/06 1830) General: Awake, alert, in no apparent distress HEENT: - Normocephalic and atraumatic,  dry mm, no LN++, no Thyromegally LUNGS - Clear to auscultation bilaterally with no wheezes CV - S1S2 RRR, no m/r/g, equal pulses bilaterally. ABDOMEN - Soft, nontender, nondistended with normoactive BS Ext: warm, well perfused, intact peripheral pulses, no edema  NEURO:  Mental Status: AA&Ox3  Language: speech is clear.  Naming, repetition, fluency, and comprehension intact. Cranial Nerves: PERRL 30mm/brisk. EOMI, visual fields full, no facial asymmetry, facial sensation intact, hearing intact, tongue/uvula/soft palate midline, normal sternocleidomastoid and trapezius muscle strength. No evidence of tongue atrophy or fibrillations Motor: 4+/5 right upper and right lower extremity. 5/5 left upper and left lower. No vertical drift Tone: is normal and bulk is normal Sensation- Intact to light touch bilaterally Coordination: FTN intact bilaterally, no ataxia in BLE. Gait- deferred  NIHSS - 0   Labs I have reviewed labs in epic and the results pertinent to this consultation are:  CBC    Component Value Date/Time   WBC 7.1 07/10/2017 1246   RBC 5.11 07/10/2017 1246   HGB 15.3 07/10/2017 1255   HGB 15.5 10/09/2015 0000   HCT 45.0 07/10/2017 1255   HCT 46.3 10/09/2015 0000   PLT 134 (L) 07/10/2017 1246   PLT 178 10/09/2015 0000   MCV 86.3 07/10/2017 1246   MCV 90 10/09/2015 0000   MCH 29.0 07/10/2017 1246   MCHC 33.6 07/10/2017 1246   RDW 13.7 07/10/2017 1246   RDW 13.7 10/09/2015 0000   LYMPHSABS 1.9 07/10/2017 1246   LYMPHSABS 2.1 10/09/2015 0000   MONOABS 0.6 07/10/2017 1246   EOSABS 0.1 07/10/2017 1246   EOSABS 0.2 10/09/2015 0000   BASOSABS 0.0 07/10/2017 1246   BASOSABS 0.0 10/09/2015 0000   CMP     Component Value Date/Time   NA 139 07/10/2017 1255   NA 136 10/09/2015 0000   NA 137 10/09/2015 0000   K 3.6 07/10/2017 1255   CL 101 07/10/2017 1255   CO2 25 07/10/2017 1246   GLUCOSE 163 (H) 07/10/2017 1255   BUN 21 (H) 07/10/2017 1255   BUN 10 10/09/2015 0000    BUN 11 10/09/2015 0000   CREATININE 1.00 07/10/2017 1255   CALCIUM 8.6 (L) 07/10/2017 1246   PROT 5.9 (L) 07/10/2017 1246   PROT 6.1 10/09/2015 0000   PROT 6.0 10/09/2015 0000   ALBUMIN 3.5 07/10/2017 1246   ALBUMIN 4.0 10/09/2015 0000   ALBUMIN 4.2 10/09/2015 0000   AST 20 07/10/2017 1246   ALT 22 07/10/2017 1246   ALKPHOS 32 (L) 07/10/2017 1246   BILITOT 0.9 07/10/2017 1246   BILITOT 0.5 10/09/2015 0000   BILITOT 0.4 10/09/2015 0000   GFRNONAA >60 07/10/2017 1246   GFRAA >60 07/10/2017 1246    Lipid Panel  Component Value Date/Time   CHOL 168 06/22/2017 0347   TRIG 130 06/22/2017 0347   HDL 30 (L) 06/22/2017 0347   CHOLHDL 5.6 06/22/2017 0347   VLDL 26 06/22/2017 0347   LDLCALC 112 (H) 06/22/2017 0347     Imaging I have reviewed the images obtained:  CT-scan of the brain - shows no acute changes.  CTA from the last admission shows a very stenotic left M1 versus a thrombus in the left M1. Intracranial atherosclerosis.  Assessment:  66 year old gentleman with a past medical history of stroke with mild residual aphasia, who had an extended hospital stay last month after a stroke, is stay that was complicated by cholecystitis status post cholecystectomy, and an ear infection, presented for evaluation of at least 24 hours worth of worsening of aphasia and right-sided weakness. His CT is from the last admission show a very stenotic left M1 versus a thrombus in the proximal M1 that prevents filling of the distal branches. At this time, my suspicion is that he either has a new stroke because of that stenosis or he has recrudescence of symptoms because of his current ear infection and debility. He might have had episodes of hypotension, which might have given him this presentation in the setting of focal and diffuse intracranial stenosis and atherosclerosis  Impression: New stroke versus recrudescence of old stroke symptoms.   Recommendations: Obtain CTA of the head and  neck Allow for permissive hypertension For now, continue with dual antiplatelets and high-dose statin. Obtain an MRI of the brain He has a loop recorder in place, that was placed during last admission. Currently check with MRI if the time duration is compatible with MRI. The loop recorder the usually safe for MRI but there is a certain time period After insertion that MRI might not be possible. In case MRI is not possible, a CT perfusion can be considered. Further recommendations will be provided by the stroke team in the morning after availability of these results.  -- Milon DikesAshish Emmy Keng, MD [facility] [phone]  If 7pm to 7am, please call on call as listed on AMION.

## 2017-07-10 NOTE — ED Notes (Signed)
Pt's wife reports recent stroke  X 2 weeks ago .  Yesterday, pt started to have R sided weakness and slow to respond.  Pt is A&O x 4.  Denies any pain at this time.

## 2017-07-10 NOTE — ED Notes (Signed)
Family at bedside. 

## 2017-07-10 NOTE — H&P (Signed)
History and Physical    Raymond ReachRoger D Bustamante [name] DOB: 04/05/1951 DOA: 07/10/2017  PCP: Forrest Moronuehle, Stephen, MD Patient coming from: Home  Chief Complaint: Right sided weakness  HPI: Raymond [name] is a 66 y.o. male with medical history significant of male with medical history significant of CVA, HTN and DM type 2, sleep apnea on CPAP, and depression. He presents with one day of worsening right sided weakness. He has associated word finding difficulties. He took aspirin and Plavix today. He was recently admitted for a left sided stroke, however, those symptoms had resolved. He reports no palpitations, chest pain, leg swelling.  ED Course: Vitals: Afebrile, bradycardia, respirations in high teens, BP mildly elevated with systolics in 140s, on room air Labs: Glucose of 169, platelets of 134 Imaging: Head CT significant for no acute intracranial abnormality Medications/Course: None  Review of Systems: Review of Systems  Constitutional: Negative for chills and fever.  Respiratory: Negative for cough, hemoptysis, sputum production, shortness of breath and wheezing.   Cardiovascular: Negative for chest pain and palpitations.  Neurological: Positive for focal weakness. Negative for sensory change and speech change.  All other systems reviewed and are negative.   Past Medical History:  Diagnosis Date  . Arthritis   . Depression   . Diabetes mellitus type 2 in obese (HCC)   . Hypertension   . Migraines   . Sleep apnea     Past Surgical History:  Procedure Laterality Date  . CHOLECYSTECTOMY    . KNEE ARTHROSCOPY    . LOOP RECORDER INSERTION N/A 06/24/2017   Procedure: LOOP RECORDER INSERTION;  Surgeon: Hillis RangeAllred, James, MD;  Location: [facility];  Service: Cardiovascular;  Laterality: N/A;  . ROTATOR CUFF REPAIR    . TEE WITHOUT CARDIOVERSION N/A 06/24/2017   Procedure: TRANSESOPHAGEAL ECHOCARDIOGRAM (TEE) WITH LOOP;  Surgeon: Elease HashimotoNahser, Deloris PingPhilip J, MD;  [facility];   Service: Cardiovascular;  Laterality: N/A;     reports that he has been smoking Cigars.  He has never used smokeless tobacco. He reports that he does not drink alcohol or use drugs.  Allergies  Allergen Reactions  . Flomax [Tamsulosin Hcl]     Effects bp     Family History  Problem Relation Age of Onset  . Other Mother        reports in good health  . Other Father        was estranged from family, unknown medical history    Prior to Admission medications   Medication Sig Start Date End Date Taking? Authorizing Provider  acetaminophen (TYLENOL) 500 MG tablet Take 2 tablets (1,000 mg total) by mouth every 6 (six) hours as needed. Patient taking differently: Take 1,000 mg by mouth every 6 (six) hours as needed for mild pain.  04/23/16   Arby BarrettePfeiffer, Marcy, MD  aspirin (BAYER ASPIRIN) 325 MG tablet Take 1 tablet (325 mg total) by mouth daily. 06/24/17 06/24/18  Marguerita MerlesSheikh, Omair Latif, DO  atorvastatin (LIPITOR) 20 MG tablet Take 1 tablet (20 mg total) by mouth daily at 6 PM. 06/24/17   Marguerita MerlesSheikh, Omair Latif, DO  citalopram (CELEXA) 40 MG tablet Take 1 tablet (40 mg total) by mouth daily. 01/08/16   Arfeen, Phillips GroutSyed T, MD  clopidogrel (PLAVIX) 75 MG tablet Take 1 tablet (75 mg total) by mouth daily. 06/24/17   Sheikh, Omair Latif, DO  guaiFENesin-codeine 100-10 MG/5ML syrup Take 5 mLs by mouth every 6 (six) hours as needed for cough. 06/14/17   Mackuen, Cindee Saltourteney Lyn, MD  hydrOXYzine (ATARAX/VISTARIL) 50 MG tablet Take 1 tablet (50 mg total) by mouth at bedtime as needed for anxiety. 01/08/16   Arfeen, Phillips Grout, MD  metFORMIN (GLUCOPHAGE) 500 MG tablet Take 500 mg by mouth at bedtime.  11/21/15   [provider]  naproxen (NAPROSYN) 500 MG tablet Take 1 tablet (500 mg total) by mouth 3 (three) times daily as needed. Patient taking differently: Take 500 mg by mouth 3 (three) times daily as needed for mild pain.  10/26/12   Charm Rings, NP  nortriptyline (PAMELOR) 25 MG capsule Take 1 capsule (25 mg  total) by mouth at bedtime. 01/08/16   Arfeen, Phillips Grout, MD  phenytoin (DILANTIN) 100 MG ER capsule Take 100 mg by mouth 2 (two) times daily.    [provider]  propranolol (INDERAL) 20 MG tablet Take 20 mg by mouth 2 (two) times daily.    [provider]  senna-docusate (SENOKOT-S) 8.6-50 MG tablet Take 1 tablet by mouth at bedtime as needed for mild constipation. 06/24/17   Marguerita Merles Latif, DO  verapamil (CALAN-SR) 120 MG CR tablet Take 120 mg by mouth at bedtime.  11/11/14   [provider]    Physical Exam: Vitals:   07/10/17 1245 07/10/17 1553  BP: (!) 143/89 (!) 148/83  Pulse: (!) 46 (!) 47  Resp: 18 18  Temp: 97.8 F (36.6 C)   SpO2: 97% 99%     Constitutional: NAD, calm, comfortable Eyes: PERRL, lids and conjunctivae normal ENMT: Mucous membranes are moist. Posterior pharynx clear of any exudate or lesions. Normal dentition.  Neck: normal, supple, no masses, no thyromegaly Respiratory: clear to auscultation bilaterally, no wheezing, no crackles. Normal respiratory effort. No accessory muscle use.  Cardiovascular: Regular rate and rhythm, no murmurs / rubs / gallops. No extremity edema. 2+ pedal pulses. No carotid bruits.  Abdomen: no tenderness, no masses palpated. No hepatosplenomegaly. Bowel sounds positive.  Musculoskeletal: no clubbing / cyanosis. No joint deformity upper and lower extremities. Good ROM, no contractures. Normal muscle tone.  Skin: no rashes, lesions, ulcers. No induration Neurologic: CN 2-12 grossly intact. Sensation intact, brachial reflexes normal. Patellar reflexes diminished bilaterally. Strength 4/5 in RUE compared to 5/5 in LUE and 3/5 in RLE compared to 5/5 in LLE. Strength 5/5 in all 4.  Psychiatric: Normal judgment and insight. Alert and oriented x 3. Normal mood.    Labs on Admission: I have personally reviewed following labs and imaging studies  CBC:  Recent Labs Lab 07/10/17 1246 07/10/17 1255  WBC 7.1  --     NEUTROABS 4.6  --   HGB 14.8 15.3  HCT 44.1 45.0  MCV 86.3  --   PLT 134*  --    Basic Metabolic Panel:  Recent Labs Lab 07/10/17 1246 07/10/17 1255  NA 137 139  K 3.7 3.6  CL 104 101  CO2 25  --   GLUCOSE 169* 163*  BUN 19 21*  CREATININE 1.10 1.00  CALCIUM 8.6*  --    GFR: CrCl cannot be calculated (Unknown ideal weight.). Liver Function Tests:  Recent Labs Lab 07/10/17 1246  AST 20  ALT 22  ALKPHOS 32*  BILITOT 0.9  PROT 5.9*  ALBUMIN 3.5   No results for input(s): LIPASE, AMYLASE in the last 168 hours. No results for input(s): AMMONIA in the last 168 hours. Coagulation Profile:  Recent Labs Lab 07/10/17 1246  INR 0.98   Cardiac Enzymes: No results for input(s): CKTOTAL, CKMB, CKMBINDEX, TROPONINI in the  last 168 hours. BNP (last 3 results) No results for input(s): PROBNP in the last 8760 hours. HbA1C: No results for input(s): HGBA1C in the last 72 hours. CBG: No results for input(s): GLUCAP in the last 168 hours. Lipid Profile: No results for input(s): CHOL, HDL, LDLCALC, TRIG, CHOLHDL, LDLDIRECT in the last 72 hours. Thyroid Function Tests: No results for input(s): TSH, T4TOTAL, FREET4, T3FREE, THYROIDAB in the last 72 hours. Anemia Panel: No results for input(s): VITAMINB12, FOLATE, FERRITIN, TIBC, IRON, RETICCTPCT in the last 72 hours. Urine analysis:    Component Value Date/Time   COLORURINE AMBER (A) 07/10/2017 1615   APPEARANCEUR CLEAR 07/10/2017 1615   LABSPEC 1.034 (H) 07/10/2017 1615   PHURINE 5.0 07/10/2017 1615   GLUCOSEU NEGATIVE 07/10/2017 1615   HGBUR NEGATIVE 07/10/2017 1615   BILIRUBINUR SMALL (A) 07/10/2017 1615   KETONESUR 5 (A) 07/10/2017 1615   PROTEINUR 30 (A) 07/10/2017 1615   UROBILINOGEN 2.0 (H) 10/19/2010 0015   NITRITE NEGATIVE 07/10/2017 1615   LEUKOCYTESUR NEGATIVE 07/10/2017 1615   Sepsis Labs: !!!!!!!!!!!!!!!!!!!!!!!!!!!!!!!!!!!!!!!!!!!! (procalcitonin:4,lacticidven:4) )No results found for this  or any previous visit (from the past 240 hour(s)).   Radiological Exams on Admission: Ct Head Wo Contrast  Result Date: 07/10/2017 CLINICAL DATA:  Right arm weakness. EXAM: CT HEAD WITHOUT CONTRAST TECHNIQUE: Contiguous axial images were obtained from the base of the skull through the vertex without intravenous contrast. COMPARISON:  CT scan of June 21, 2017. FINDINGS: Brain: Old infarction is seen involving the left basal ganglia. No mass effect or midline shift is noted. Ventricular size is within normal limits. There is no evidence of mass lesion, hemorrhage or acute infarction. Vascular: No hyperdense vessel or unexpected calcification. Skull: Normal. Negative for fracture or focal lesion. Sinuses/Orbits: No acute finding. Other: None. IMPRESSION: No acute intracranial abnormality seen. Electronically Signed   By: Lupita Raider, M.D.   On: 07/10/2017 14:35    EKG: Independently reviewed. Sinus bradycardia  Assessment/Plan Principal Problem:   Right hemiparesis (HCC) Active Problems:   Diabetes mellitus type 2 in obese [facility])   Inner ear dysfunction   Essential hypertension   Expressive aphasia   History of stroke   Right hemiparesis Expressive aphasia Concern for worsening CVA. Strength deficits still persistent. Aphasia improving. Patient recently worked up extensively during last admission one month ago for CVA affecting left striatum and internal capsule anterior limb and MCA territory. LDL of 112. Echo significant for EF of 60-65% with grade 1 diastolic dysfunction. Hemoglobin A1C of 6.1. ILR placed on 07/02/17. -neurology recommendations: MRI, CTA of head and neck -swallow screen -message sent to cardiology for loop recorder interrogation -continue aspirin, plavix, atorvastatin  History of stroke Hyperlipidemia -plan as above  Diabetes mellitus Hold metformin -SSI  Inner ear dysfunction Chronic vertigo Followed by ENT as an outpatient. -continue phenytoin -continue  propranolol  Essential hypertension Slightly uncontrolled -continue propranolol -continue varapamil  OSA on CPAP -CPAP qhs  Depression -continue nortriptyline  Bradycardia Asymptomatic. Secondary to propranolol. -if becomes symptomatic, would recommend decreasing dose    DVT prophylaxis: Lovenox Code Status: Full code Family Communication: Wife and daughter at bedside Disposition Plan: Discharge home in 24 hours Consults called: Neurology, Dr. Wilford Corner Admission status: Observation, telemetry   Jacquelin Hawking, MD [facility] Pager [phone]  If 7PM-7AM, please contact night-coverage [email or web address] Password [facility]  07/10/2017, 5:28 PM

## 2017-07-10 NOTE — ED Triage Notes (Signed)
Pts family reports new right arm weakness, difficulty speaking and rt hand tremors that have been ongoing for 2 days. Pt was recently discharged from a previous stroke 2 weeks ago. Pt is also having difficulty hearing.

## 2017-07-11 DIAGNOSIS — Z8673 Personal history of transient ischemic attack (TIA), and cerebral infarction without residual deficits: Secondary | ICD-10-CM | POA: Diagnosis not present

## 2017-07-11 DIAGNOSIS — I10 Essential (primary) hypertension: Secondary | ICD-10-CM | POA: Diagnosis not present

## 2017-07-11 DIAGNOSIS — H8390 Unspecified disease of inner ear, unspecified ear: Secondary | ICD-10-CM | POA: Diagnosis not present

## 2017-07-11 DIAGNOSIS — G8191 Hemiplegia, unspecified affecting right dominant side: Secondary | ICD-10-CM | POA: Diagnosis not present

## 2017-07-11 DIAGNOSIS — R4701 Aphasia: Secondary | ICD-10-CM | POA: Diagnosis not present

## 2017-07-11 DIAGNOSIS — E669 Obesity, unspecified: Secondary | ICD-10-CM | POA: Diagnosis not present

## 2017-07-11 DIAGNOSIS — R531 Weakness: Secondary | ICD-10-CM | POA: Diagnosis not present

## 2017-07-11 DIAGNOSIS — E1169 Type 2 diabetes mellitus with other specified complication: Secondary | ICD-10-CM | POA: Diagnosis not present

## 2017-07-11 LAB — TSH: TSH: 0.643 u[IU]/mL (ref 0.350–4.500)

## 2017-07-11 LAB — VITAMIN B12: VITAMIN B 12: 265 pg/mL (ref 180–914)

## 2017-07-11 LAB — GLUCOSE, CAPILLARY
GLUCOSE-CAPILLARY: 200 mg/dL — AB (ref 65–99)
Glucose-Capillary: 124 mg/dL — ABNORMAL HIGH (ref 65–99)

## 2017-07-11 MED ORDER — METFORMIN HCL 500 MG PO TABS: 500.0000 mg | ORAL_TABLET | Freq: Every day | ORAL | Status: DC

## 2017-07-11 MED ORDER — PROPRANOLOL HCL 10 MG PO TABS: 10.0000 mg | ORAL_TABLET | Freq: Two times a day (BID) | ORAL | Status: DC

## 2017-07-11 NOTE — Evaluation (Signed)
Occupational Therapy Evaluation Patient Details Name: Raymond [name] MRN: [id number] DOB: 30-Jun-1951 Today's Date: 07/11/2017    History of Present Illness Raymond [name] is a 66 y.o. 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 He had a left MCA stroke with complaints of aphasia but no weakness at the time in August. His hospital course was complicated by a cholecystitis which needed a cholecystectomy and also by UTI, which has caused him to have reduced hearing in his right ear.   Clinical Impression   PTA pt recovering from stroke with cognition improving, able to travel to mountains with wife for vacation. Pt is currently frustrated by [facility] in both ears (R is new) and demonstrating decreased coordination in R hand (dominant). PLease see full ADL notes below. Pt will benefit from skilled OT in the acute setting to maximize function in RUE as well as continued cognitive training and to maximize safety and independence in ADL. Pt will require OPOT as follow up as he really wants to get full function/strength and coordination in R hand and get back to driving. Next session to establish theraputty exercise program.    Follow Up Recommendations  Outpatient OT;Supervision - Intermittent    Equipment Recommendations  None recommended by OT    Recommendations for Other Services       Precautions / Restrictions Precautions Precautions: Fall Restrictions Weight Bearing Restrictions: No      Mobility Bed Mobility Overal bed mobility: Needs Assistance Bed Mobility: Supine to Sit;Sit to Supine     Supine to sit: Supervision Sit to supine: Supervision   General bed mobility comments: use of bed rail, supervision for safety  Transfers Overall transfer level: Needs assistance Equipment used: None Transfers: Sit to/from Stand Sit to Stand: Supervision         General transfer comment:  Supervision for safety during sit<>stand.     Balance Overall balance assessment: Needs assistance Sitting-balance support: No upper extremity supported;Feet supported Sitting balance-Leahy Scale: Good Sitting balance - Comments: sitting EOB   Standing balance support: No upper extremity supported;During functional activity Standing balance-Leahy Scale: Fair                             ADL either performed or assessed with clinical judgement   ADL Overall ADL's : Needs assistance/impaired Eating/Feeding: Supervision/ safety;Sitting Eating/Feeding Details (indicate cue type and reason): Pt and wife report no problems holding utensils during meals Grooming: Supervision/safety;Standing;Wash/dry hands;Wash/dry face;Oral care Grooming Details (indicate cue type and reason): able to find grooming items in crowded bucket, able to open all containers/packages with increased time.  Upper Body Bathing: Supervision/ safety;Sitting   Lower Body Bathing: Sit to/from stand;Supervison/ safety   Upper Body Dressing : Supervision/safety;Standing   Lower Body Dressing: Supervision/safety;Sit to/from stand Lower Body Dressing Details (indicate cue type and reason): able to don/doff socks without assist Toilet Transfer: Supervision/safety;Ambulation   Toileting- Clothing Manipulation and Hygiene: Supervision/safety;Sit to/from stand   Tub/ Shower Transfer: Min guard   Functional mobility during ADLs: Supervision/safety (pushed IV during session)       Vision Baseline Vision/History: Wears glasses Wears Glasses: At all times Patient Visual Report: No change from baseline Vision Assessment?: No apparent visual deficits Additional Comments: able to read from menu (and problem solve to find the day/meal), navigate crowded room environment     Perception  Praxis      Pertinent Vitals/Pain Pain Assessment: No/denies pain     Hand Dominance Right   Extremity/Trunk  Assessment Upper Extremity Assessment Upper Extremity Assessment: RUE deficits/detail;Generalized weakness RUE Deficits / Details: R weaker than left grossly 4/5, able to use functionally during sink level grooming (although defers to left hand for oral care) and trouble squeezing out toothpaste, decreased finger dexterity during finger to thumb. Pt able to touch back of head, and lower back for ADL RUE Coordination: decreased fine motor   Lower Extremity Assessment Lower Extremity Assessment: Defer to PT evaluation   Cervical / Trunk Assessment Cervical / Trunk Assessment: Normal   Communication Communication Communication: HOH   Cognition Arousal/Alertness: Awake/alert Behavior During Therapy: WFL for tasks assessed/performed Overall Cognitive Status: Within Functional Limits for tasks assessed                                 General Comments: Pt able to problem solve and manage items at sink level grooming. Pt's wife states that he was improving and over the past week waxes and waines in "being himself"   General Comments  wife, daughter and son-in-law present for session    Exercises     Shoulder Instructions      Home Living Family/patient expects to be discharged to:: Private residence Living Arrangements: Spouse/significant other Available Help at Discharge: Family;Available 24 hours/day Type of Home: House Home Access: Stairs to enter Entergy CorporationEntrance Stairs-Number of Steps: 4   Home Layout: Two level;Bed/bath upstairs Alternate Level Stairs-Number of Steps: flight   Bathroom Shower/Tub: Producer, television/film/videoWalk-in shower   Bathroom Toilet: Standard     Home Equipment: Environmental consultantWalker - 2 wheels;Cane - single point;Bedside commode      Lives With: Spouse    Prior Functioning/Environment Level of Independence: Independent        Comments: recently hospitalized, did enjoy a vacation in the mountains and wife said he was improving cognitively        OT Problem List: Decreased  strength;Decreased range of motion;Decreased coordination;Impaired UE functional use      OT Treatment/Interventions: Self-care/ADL training;Energy conservation;Therapeutic activities;Therapeutic exercise;Cognitive remediation/compensation;Patient/family education;Balance training    OT Goals(Current goals can be found in the care plan section) Acute Rehab OT Goals Patient Stated Goal: to get back to driving and caring for his grandchildren OT Goal Formulation: With patient/family Time For Goal Achievement: 07/25/17 Potential to Achieve Goals: Good ADL Goals Pt Will Perform Grooming: with modified independence;standing Pt/caregiver will Perform Home Exercise Program: Right Upper extremity;With theraputty;With written HEP provided Additional ADL Goal #1: Pt will complete 3 sequenced ADL activities with less than 2 verbal cues  OT Frequency: Min 2X/week   Barriers to D/C:            Co-evaluation              AM-PAC PT "6 Clicks" Daily Activity     Outcome Measure Help from another person eating meals?: None Help from another person taking care of personal grooming?: A Little Help from another person toileting, which includes using toliet, bedpan, or urinal?: A Little Help from another person bathing (including washing, rinsing, drying)?: A Little Help from another person to put on and taking off regular upper body clothing?: None Help from another person to put on and taking off regular lower body clothing?: A Little 6 Click Score: 20   End of Session Equipment Utilized During Treatment: Gait belt Nurse  Communication: Mobility status  Activity Tolerance: Patient tolerated treatment well Patient left: in bed;with call bell/phone within reach;with family/visitor present  OT Visit Diagnosis: Unsteadiness on feet (R26.81);Muscle weakness (generalized) (M62.81)                Time: 1610-9604 OT Time Calculation (min): 22 min Charges:  OT General Charges $OT Visit: 1  Visit OT Evaluation $OT Eval Moderate Complexity: 1 Mod G-Codes: OT G-codes **NOT FOR INPATIENT CLASS** Functional Assessment Tool Used: AM-PAC 6 Clicks Daily Activity Functional Limitation: Self care Self Care Current Status (V4098): At least 20 percent but less than 40 percent impaired, limited or restricted Self Care Goal Status (J1914): At least 1 percent but less than 20 percent impaired, limited or restricted   Sherryl Manges OTR/L [phone] Evern Bio Victorine Mcnee 07/11/2017, 10:13 AM

## 2017-07-11 NOTE — Progress Notes (Signed)
STROKE TEAM PROGRESS NOTE   HISTORY OF PRESENT ILLNESS (per record) Raymond ReachRoger D [name] is a 66 y.o. 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  He had a left MCA stroke with complaints of aphasia but no weakness at the time in August. His hospital course was complicated by a cholecystitis which needed a cholecystectomy and also by some urinary infection, which has caused him to have reduced hearing in his right ear.  The wife also noticed that he has been having word finding difficulty ever since yesterday and does not sound like himself.  There is no preceding illnesses. No fevers chills. No chest pain or shortness of breath. No nausea or vomiting. No visual symptoms. No headaches.  He was discharged on dual antiplatelets and statins last time and is compliant with medications.  His wife reports easy bleeding ever since he is been on dual antiplatelet.  He also got a TEE done last time and a loop recorder inserted for evaluation of a possible embolic source for his stroke although looking at his blood vessels it does look like that might not be a clot but a stenosis.  LKW: 5 PM on 07/09/2017 Premorbid modified Rankin scale (mRS):  1  Patient was not administered IV t-PA secondary to arriving outside of the treatment window. He was admitted to General Neurology for further evaluation and treatment.   SUBJECTIVE (INTERVAL HISTORY) His daughter and her significant other are at the bedside.  The pt is awake, alert, and follows all commands appropriately.   Per the family has been taking all prescribed medications as directed, and has quit smoking since his last admission.  Discussed risks and benefits of aggressive medical management of risk factors and possibility of  diagnostic angiograms.  Some reduction of RUE fine motor skills.  No appreciable anomia.  [employer] rep in the room interrogating loop recorder.  Per [employer] rep, no afib since June 24, 2017.   OBJECTIVE Temp:  [97.7 F (36.5 C)-98.7 F (37.1 C)] 97.9 F (36.6 C) (09/07 1030) Pulse Rate:  [44-55] 55 (09/07 1030) Cardiac Rhythm: Sinus bradycardia (09/07 0746) Resp:  [16-23] 20 (09/07 1030) BP: (132-162)/(65-89) 133/65 (09/07 1030) SpO2:  [97 %-100 %] 97 % (09/07 1030) Weight:  [102 kg (224 lb 13.9 oz)] 102 kg (224 lb 13.9 oz) (09/06 2045)  CBC:   Recent Labs Lab 07/10/17 1246 07/10/17 1255  WBC 7.1  --   NEUTROABS 4.6  --   HGB 14.8 15.3  HCT 44.1 45.0  MCV 86.3  --   PLT 134*  --     Basic Metabolic Panel:   Recent Labs Lab 07/10/17 1246 07/10/17 1255  NA 137 139  K 3.7 3.6  CL 104 101  CO2 25  --   GLUCOSE 169* 163*  BUN 19 21*  CREATININE 1.10 1.00  CALCIUM 8.6*  --     Lipid Panel:     Component Value Date/Time   CHOL 168 06/22/2017 0347   TRIG 130 06/22/2017 0347   HDL 30 (L) 06/22/2017 0347   CHOLHDL 5.6 06/22/2017 0347   VLDL 26 06/22/2017 0347   LDLCALC 112 (H) 06/22/2017 0347   HgbA1c:  Lab Results  Component Value Date   HGBA1C 6.1 (H) 06/22/2017  Urine Drug Screen:     Component Value Date/Time   LABOPIA NONE DETECTED 06/22/2017 2323   COCAINSCRNUR NONE DETECTED 06/22/2017 2323   LABBENZ NONE DETECTED 06/22/2017 2323   AMPHETMU NONE DETECTED 06/22/2017 2323   THCU NONE DETECTED 06/22/2017 2323   LABBARB NONE DETECTED 06/22/2017 2323    Alcohol Level     Component Value Date/Time   ETH <11 10/20/2012 2113    IMAGING  Ct Angio Head W Or Wo Contrast Ct Angio Neck W Or Wo Contrast 07/10/2017 IMPRESSION: 1. Stable CTA of the head and neck as compared to recent exam from 06/22/2017. 2. 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 3.  Prominent atherosclerotic irregularity involving the median sized vessels, most prominent within the bilateral PCAs, with resultant moderate to severe stenoses. 4. Essentially normal CTA of the neck with minimal atherosclerotic change for age.  Ct Head Wo Contrast 07/10/2017 IMPRESSION: No acute intracranial abnormality seen.  Mr Brain Wo Contrast 07/10/2017 IMPRESSION: 1. Normal expected interval evolution of subacute left MCA territory infarcts, with no evidence for new/acute ischemia on today's exam. 2. Additional remote ischemic changes within the left MCA territory and watershed regions, stable. 3. Diminished left MCA flow voids, better seen on previous CTAs.    PHYSICAL EXAM Pleasant middle aged Caucasian male not in distress. . Afebrile. Head is nontraumatic. Neck is supple without bruit.    Cardiac exam no murmur or gallop. Lungs are clear to auscultation. Distal pulses are well felt. Neurological Exam :  Awake alert oriented x 2 mild expressive aphasia with some word finding difficulty and word hesitation. Good naming, repitition and comprehension. Eye movements full range. No field ct. Mild right lower face wakness. No UE drift but diminished fine finger movements on right and orbits left over right upper extremity. Mild right grip weakness.No LE weakness, sensation intact. Gait steady. ASSESSMENT/PLAN [name] is a 66 y.o. 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  He did not receive IV t-PA due to arriving outside of the treatment window, and recent stroke.    Late recrudescence of recent stroke: No new lesions identified on imaging, in the setting of recent stroke-   Resultant  Mild expressive aphasia and right hand weakness  CT head: no acute stroke  MRI head: no acute stroke.  Normal evolution of old L MCA infarcts.  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 Yes; Fluid consistency: Thin  aspirin 325 mg daily and clopidogrel 75 mg daily prior to admission, now on aspirin 325 mg daily and clopidogrel 75 mg daily  Patient counseled to be compliant with his antithrombotic medications  Ongoing aggressive stroke risk factor management  Therapy recommendations: Outpatient OT;Supervision - Intermittent   Disposition: pending  Hypertension  Stable  Permissive hypertension (OK if < 220/120) but gradually normalize in 5-7 days  Long-term BP goal normotensive  Hyperlipidemia  Home meds: atorvastatin 20 mg PO daily,resumed in hospital  LDL 112, goal < 70  Continue statin at discharge  Diabetes  HgbA1c 6.1, goal < 7.0  Controlled  Other Stroke Risk Factors  Advanced age  Recent smoker (quit 2018)  Borderline morbid obesity, Body mass index is 38.6 kg/m., recommend weight loss, diet and exercise as appropriate   Hx stroke/TIA  Other Active Problems  None  Hospital day # 0  I have personally examined this patient, reviewed notes, independently viewed imaging studies, participated in medical decision making and plan of care.ROS completed by me personally and pertinent positives fully documented  I have made any additions or clarifications directly to the above note. He has presented with slight worsening of recent stroke deficits without demonstration of definite new stroke on MRI this may be from late recrdescence of his stroke. Etiology of his stroke from left MCA embolus with partial recanalization versus intrinsic stenosi sis unclear. Nevertheless intracranial PTA/stenting is not favored treat,ent at present time and recommend aggressive medical therapy first.Lond d/w patient and daughter and  answered questions to this regard. Family not interested in aggressive w/u with cerebral catheter angio and PTA/stent at present time.Continue dual antiplatelet therapy x 3 months followed by Plavix alone and aggressive risk factor modification. F/U as outpatient in stroke clinic already scheduled since lasta dmission. Greater than 50% time during this 25 minute visit was spent on counselling and coordination of care about his stroke and answering questions.  Delia Heady, MD Medical Director [facility] Pager: [phone] 07/11/2017 8:46 PM   To contact Stroke Continuity provider, please refer to [email or web address]. After hours, contact General Neurology

## 2017-07-11 NOTE — Care Management Obs Status (Signed)
MEDICARE OBSERVATION STATUS NOTIFICATION   Patient Details  Name: Raymond [name] MRN: [id number] Date of Birth: Dec 25, 1950   Medicare Observation Status Notification Given:  Yes    [name], RN 07/11/2017, 2:31 PM

## 2017-07-11 NOTE — Care Management Note (Signed)
Case Management Note  Patient Details  Name: ERCIL CASSIS MRN: [id number] Date of Birth: 1951/03/11  Subjective/Objective:                    Action/Plan: Pt discharging home with his family. CM consulted for outpatient therapy. CM met with the patient and his family and they would like to go somewhere close to [place]. CM provided them with the orders for the outpatient therapy and with some choices: [facility] and rehab center at [place] that both provide PT/OT. Family to discuss and call and make appointment.  Family to provide transportation home today.   Expected Discharge Date:  07/11/17               Expected Discharge Plan:  OP Rehab  In-House Referral:     Discharge planning Services  CM Consult  Post Acute Care Choice:    Choice offered to:  Patient, Spouse  DME Arranged:    DME Agency:     HH Arranged:    [place] Agency:     Status of Service:  Completed, signed off  If discussed at [place] of Stay Meetings, dates discussed:    Additional Comments:  Pollie Friar, RN 07/11/2017, 2:24 PM

## 2017-07-11 NOTE — Progress Notes (Signed)
Discharge orders and follow up appointments reviewed with pt. Teaching and handout provided. Pt v/s stable. Pt escorted from unit via wheelchair.  Valeria Krisko, RN   

## 2017-07-11 NOTE — Evaluation (Signed)
Physical Therapy Evaluation & Discharge Patient Details Name: Raymond [name] MRN: [id number] DOB: 10-09-1951 Today's Date: 07/11/2017   History of Present Illness  Raymond [name] is a 66 y.o. 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 He had a left MCA stroke with complaints of aphasia but no weakness at the time in August. His hospital course was complicated by a cholecystitis which needed a cholecystectomy and also by UTI, which has caused him to have reduced hearing in his right ear.  Clinical Impression  Pt presented supine in bed with HOB elevated, awake and willing to participate in therapy session. Prior to admission, pt reported that he was independent with all functional mobility and ADLs. Pt's daughter and spouse present throughout session. Pt ambulated in hallway without use of an AD with supervision for safety. He also successfully completed stair training with no concerns. Pt participated in higher level balance testing and scored 19/24, which puts him at a higher risk for falls. Pt would greatly benefit from further skilled PT services in the [place] setting upon d/c. No further acute PT needs identified at this time. PT signing off.    Follow Up Recommendations Outpatient PT;Other (comment) (OP NEURO PT)    Equipment Recommendations  None recommended by PT    Recommendations for Other Services       Precautions / Restrictions Precautions Precautions: Fall Restrictions Weight Bearing Restrictions: No      Mobility  Bed Mobility Overal bed mobility: Modified Independent             General bed mobility comments: increased time and effort  Transfers Overall transfer level: Needs assistance Equipment used: None Transfers: Sit to/from Stand Sit to Stand: Supervision         General transfer comment: supervision for safety  Ambulation/Gait Ambulation/Gait  assistance: Supervision Ambulation Distance (Feet): 300 Feet Assistive device: None Gait Pattern/deviations: Step-through pattern Gait velocity: decreased Gait velocity interpretation: Below normal speed for age/gender General Gait Details: pt with preference for slower speed but able to increase upon request. pt with mild instability but no overt LOB or need for physical assistance, supervision for safety  Stairs Stairs: Yes Stairs assistance: Supervision Stair Management: No rails;Alternating pattern;Step to pattern;Forwards Number of Stairs: 5 General stair comments: pt able to perform with no difficulties  Wheelchair Mobility    Modified Rankin (Stroke Patients Only) Modified Rankin (Stroke Patients Only) Pre-Morbid Rankin Score: No significant disability Modified Rankin: No significant disability     Balance Overall balance assessment: Needs assistance Sitting-balance support: No upper extremity supported;Feet supported Sitting balance-Leahy Scale: Good     Standing balance support: No upper extremity supported;During functional activity Standing balance-Leahy Scale: Fair                   Standardized Balance Assessment Standardized Balance Assessment : Dynamic Gait Index   Dynamic Gait Index Level Surface: Normal Change in Gait Speed: Normal Gait with Horizontal Head Turns: Mild Impairment Gait with Vertical Head Turns: Mild Impairment Gait and Pivot Turn: Mild Impairment Step Over Obstacle: Mild Impairment Step Around Obstacles: Mild Impairment Steps: Normal Total Score: 19       Pertinent Vitals/Pain Pain Assessment: No/denies pain    Home Living Family/patient expects to be discharged to:: Private residence Living Arrangements: Spouse/significant other Available Help at Discharge: Family;Available 24 hours/day Type of Home: House Home Access: Stairs to enter  Entrance Stairs-Number of Steps: 4 Home Layout: Two level;Bed/bath  upstairs Home Equipment: Walker - 2 wheels;Cane - single point;Bedside commode      Prior Function Level of Independence: Independent         Comments: recently hospitalized, [place] and wife said he was improving cognitively     Hand Dominance   Dominant Hand: Right    Extremity/Trunk Assessment   Upper Extremity Assessment Upper Extremity Assessment: Defer to OT evaluation    Lower Extremity Assessment Lower Extremity Assessment: Overall WFL for tasks assessed    Cervical / Trunk Assessment Cervical / Trunk Assessment: Normal  Communication   Communication: HOH  Cognition Arousal/Alertness: Awake/alert Behavior During Therapy: WFL for tasks assessed/performed Overall Cognitive Status: Within Functional Limits for tasks assessed                                 General Comments: Cognition WFL for general conversation, not formally tested.      General Comments      Exercises     Assessment/Plan    PT Assessment All further PT needs can be met in the next venue of care  PT Problem List         PT Treatment Interventions      PT Goals (Current goals can be found in the Care Plan section)  Acute Rehab PT Goals Patient Stated Goal: to get back to driving and caring for his grandchildren    Frequency     Barriers to discharge        Co-evaluation               AM-PAC PT "6 Clicks" Daily Activity  Outcome Measure Difficulty turning over in bed (including adjusting bedclothes, sheets and blankets)?: None Difficulty moving from lying on back to sitting on the side of the bed? : None Difficulty sitting down on and standing up from a chair with arms (e.g., wheelchair, bedside commode, etc,.)?: None Help needed moving to and from a bed to chair (including a wheelchair)?: None Help needed walking in hospital room?: None Help needed climbing 3-5 steps with a railing? : None 6 Click Score: 24    End of  Session Equipment Utilized During Treatment: Gait belt Activity Tolerance: Patient tolerated treatment well Patient left: in bed;with call bell/phone within reach;with family/visitor present Nurse Communication: Mobility status PT Visit Diagnosis: Other symptoms and signs involving the nervous system (R29.898)    Time: 8675-4492 PT Time Calculation (min) (ACUTE ONLY): 14 min   Charges:   PT Evaluation $PT Eval Moderate Complexity: 1 Mod     PT G Codes:   PT G-Codes **NOT FOR INPATIENT CLASS** Functional Assessment Tool Used: AM-PAC 6 Clicks Basic Mobility;Clinical judgement Functional Limitation: Mobility: Walking and moving around Mobility: Walking and Moving Around Current Status (E1007): 0 percent impaired, limited or restricted Mobility: Walking and Moving Around Goal Status (H2197): 0 percent impaired, limited or restricted Mobility: Walking and Moving Around Discharge Status (J8832): 0 percent impaired, limited or restricted    [facility], PT, DPT [place] 07/11/2017, 2:11 PM

## 2017-07-11 NOTE — Discharge Summary (Signed)
Physician Discharge Summary  Raymond [name] OZH:[id number] DOB: [birth year] DOA: 07/10/2017  PCP: Forrest Moron, MD  Admit date: 07/10/2017 Discharge date: 07/11/2017  Time spent: 45 minutes  Recommendations for Outpatient Follow-up:  Patient will be discharged to home with outpatient physical and occupational therapy.  Patient will need to follow up with primary care provider within one week of discharge.  Hold your metformin until 07/13/2017. Follow-up with Dr. Pearlean Brownie for scheduled appointment. Patient should continue medications as prescribed.  Patient should follow a Heart healthy/Carb modified diet.   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 Jodean Lima Raymond [name] is a 66 y.o. male with medical history significant of malewith medical history significant of CVA, HTN and DM type 2, sleep apnea on CPAP, and depression. He presents with one day of worsening right sided weakness. He has associated word finding difficulties. He took aspirin and Plavix today. He was recently admitted for a left sided stroke, however, those symptoms had resolved. He reports no palpitations, chest pain, leg swelling.  Hospital Course:  Right hemiparesis/Expressive Aphasia with recent history of CVA -Patient recently had extensive workup during admission on 8/18-21/2018 for CVA affecting the left striated and internal capsule anterior limb, MCA territory -CT head showed no acute intracranial normality -CTA head and neck was stable as compared to recent exam on 06/22/2017. Occlusion and/or preocclusive stenosis  involving the proximal left M1 segment and decreased perfusion distally, stable from previous. -LDL 112 hemoglobin A1c 6.1 -IL are placed 07/02/2017 -Echocardiogram showed EF of 60-65%, grade 1 diastolic dysfunction -Neurology was consulted and appreciated, recommended patient follow-up in the office at scheduled time. No further inpatient workup. Patient may need angiography however given recent stroke, is currently high risk. -Continue aspirin, Plavix, statin -PT/OT consulted and recommended outpatient follow-up  Hyperlipidemia -Continue statin  Diabetes mellitus, type II -Continue metformin on 07/13/2017 (held as patient received contrast for CTA on 9/6)  Essential hypertension -Continue verapamil -Propranolol held due to bradycardia  Inner ear dysfunction/chronic vertigo -Patient has had history of hearing loss over the past year however it is worsened recently -He follows with ENT at [facility] -Continue phenytoin -Propranolol held due to bradycardia  Obstructive sleep apnea -Continue CPAP  Depression -Continue nortriptyline  Sinus bradycardia -Currently asymptomatic -Propranolol held -? Secondary to recent stroke and possibly increased vagal tone  Procedures: None  Consultations: Neurology  Discharge Exam: Vitals:   07/11/17 0900 07/11/17 1030  BP: (!) 150/75 133/65  Pulse: (!) 54 (!) 55  Resp: 18 20  Temp: 97.9 F (36.6 C) 97.9 F (36.6 C)  SpO2: 94% 97%   Patient denies chest pain, short of breath, abdominal pain, nausea vomiting, diarrhea or constipation. Complains of hearing loss and is concerned. Complains of continued weakness.   General: Well developed, well nourished, NAD, appears stated age  HEENT: NCAT, mucous membranes moist.  Cardiovascular: S1 S2 auscultated, no rubs, murmurs or gallops. Regular rate and rhythm.  Respiratory: Clear to auscultation bilaterally with equal chest rise  Abdomen: Soft, obese, nontender, nondistended, + bowel  sounds  Extremities: warm dry without cyanosis clubbing or edema  Neuro: AAOx3, right sided weakness 3-4/5, otherwise nonfocal  Psych: Appropriate mood and affect  Discharge Instructions Discharge Instructions    Ambulatory referral to Occupational Therapy    Complete by:  As directed    Ambulatory referral to Physical Therapy    Complete by:  As directed    Discharge instructions    Complete by:  As directed    Patient will be discharged to home with outpatient physical and occupational therapy.  Patient will need to follow up with primary care provider within one week of discharge.  Hold your metformin until 07/13/2017. Follow-up with Dr. Pearlean Brownie for scheduled appointment. Patient should continue medications as prescribed.  Patient should follow a Heart healthy/Carb modified diet.     Current Discharge Medication List    CONTINUE these medications which have CHANGED   Details  metFORMIN (GLUCOPHAGE) 500 MG tablet Take 1 tablet (500 mg total) by mouth at bedtime.      CONTINUE these medications which have NOT CHANGED   Details  acetaminophen (TYLENOL) 500 MG tablet Take 2 tablets (1,000 mg total) by mouth every 6 (six) hours as needed. Qty: 30 tablet, Refills: 0    aspirin (BAYER ASPIRIN) 325 MG tablet Take 1 tablet (325 mg total) by mouth daily. Qty: 360 tablet, Refills: 0    atorvastatin (LIPITOR) 20 MG tablet Take 1 tablet (20 mg total) by mouth daily at 6 PM. Qty: 30 tablet, Refills: 0    clopidogrel (PLAVIX) 75 MG tablet Take 1 tablet (75 mg total) by mouth daily. Qty: 30 tablet, Refills: 0    losartan-hydrochlorothiazide (HYZAAR) 100-25 MG tablet Take 0.5 tablets by mouth daily.    phenytoin (DILANTIN) 100 MG ER capsule Take 100 mg by mouth 2 (two) times daily.    senna-docusate (SENOKOT-S) 8.6-50 MG tablet Take 1 tablet by mouth at bedtime as needed for mild constipation. Qty: 30 tablet, Refills: 0    verapamil (CALAN-SR) 120 MG CR tablet Take 60 mg by mouth at  bedtime.       STOP taking these medications     propranolol (INDERAL) 20 MG tablet      hydrOXYzine (ATARAX/VISTARIL) 50 MG tablet        Allergies  Allergen Reactions  . Flomax [Tamsulosin Hcl] Other (See Comments)    HYPOtension and syncope   Follow-up Information    Forrest Moron, MD. Schedule an appointment as soon as possible for a visit in 1 week(s).   Specialty:  Internal Medicine Why:  Hospital follow up Contact information: [address] [place] [address] [phone]        Micki Riley, MD. Go to.   Specialties:  Neurology, Radiology Why:  Scheduled appt Contact information: [address] [place] [address] [phone]            The results of significant diagnostics from this hospitalization (including imaging, microbiology, ancillary and laboratory) are listed below for reference.    Significant Diagnostic Studies: Ct Angio Head W Or Wo Contrast  Result Date: 07/10/2017 CLINICAL DATA:  Initial evaluation for acute onset right arm weakness. EXAM: CT ANGIOGRAPHY HEAD AND NECK TECHNIQUE: Multidetector CT imaging of the head and neck was performed using the standard protocol during bolus administration of intravenous contrast. Multiplanar CT image reconstructions and MIPs were obtained to evaluate the vascular anatomy. Carotid stenosis measurements (when applicable) are obtained utilizing NASCET criteria, using the distal internal carotid diameter as the denominator. CONTRAST:  50 cc of Isovue 370. COMPARISON:  Prior CT from earlier the same  day as well as recent CTA from 06/22/2017. FINDINGS: CTA NECK FINDINGS Aortic arch: Aortic arch of normal caliber with normal branch pattern. No flow-limiting stenosis about the origin of the great vessels. Visualized subclavian artery use widely patent. Right carotid system: Right carotid artery system widely patent without stenosis, dissection, or occlusion. No significant atheromatous  narrowing about the right carotid bifurcation. Left carotid system: Left carotid artery system widely patent without stenosis, dissection, or occlusion. No significant atheromatous narrowing about the left carotid bifurcation. Vertebral arteries: Both of the vertebral arteries arise from the subclavian arteries. Vertebral artery is widely patent within the neck without stenosis, dissection, or occlusion. Left vertebral artery dominant. Skeleton: No acute osseus abnormality. No worrisome lytic or blastic osseous lesions. Stable mild multilevel degenerative spondylolysis, greatest at C5-6. Other neck: No acute soft tissue abnormality within the neck. Salivary glands normal. No adenopathy. Thyroid normal. Upper chest: Upper chest within normal limits. Calcified granuloma noted at the posterior left upper lobe. Review of the MIP images confirms the above findings CTA HEAD FINDINGS Anterior circulation: Petrous, cavernous, and supraclinoid segments of the internal carotid artery's patent with antegrade flow. Hypoplastic but patent left A1 segment. Patent right A1. Normal small anterior communicating artery. Atheromatous irregularity involving the anterior cerebral arteries without high-grade stenosis. Short-segment severe stenosis/ near occlusion of the proximal left M1 segment again noted, stable in appearance from previous. Markedly attenuated flow with decreased perfusion distally, also stable. Mild atheromatous regularity within the right M1 segment without stenosis. Right MCA bifurcation normal. Atheromatous irregularity within the distal right MCA branches which are well opacified. Posterior circulation: Vertebral artery's patent to the vertebrobasilar junction without stenosis. Left vertebral artery dominant. Patent left PICA. Basilar artery widely patent to its distal aspect. Superior cerebral arteries patent bilaterally. Both of the posterior cerebral arteries primarily supplied via the basilar. Extensive  multifocal atheromatous regularity involving the bilateral PCAs again noted, slightly worse on the right. Appearance is similar to previous. Venous sinuses: Patent. Anatomic variants: None. Delayed phase: No pathologic enhancement. Review of the MIP images confirms the above findings IMPRESSION: 1. Stable CTA of the head and neck as compared to recent exam from 06/22/2017. 2. 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 3. Prominent atherosclerotic irregularity involving the median sized vessels, most prominent within the bilateral PCAs, with resultant moderate to severe stenoses. 4. Essentially normal CTA of the neck with minimal atherosclerotic change for age. Electronically Signed   By: Rise MuBenjamin  McClintock M.D.   On: 07/10/2017 18:51   Ct Angio Head W Or Wo Contrast  Result Date: 06/22/2017 CLINICAL DATA:  Stroke follow-up. EXAM: CT ANGIOGRAPHY HEAD AND NECK TECHNIQUE: Multidetector CT imaging of the head and neck was performed using the standard protocol during bolus administration of intravenous contrast. Multiplanar CT image reconstructions and MIPs were obtained to evaluate the vascular anatomy. Carotid stenosis measurements (when applicable) are obtained utilizing NASCET criteria, using the distal internal carotid diameter as the denominator. CONTRAST:  50 cc Isovue 370 intravenous COMPARISON:  Brain MRI 12/04/2015 FINDINGS: CTA NECK FINDINGS Aortic arch: Probable atheromatous thickening of the wall diffusely. No acute finding. Three vessel branching. Right carotid system: Questionable non atheromatous wall thickening. No stenosis, dissection, or ulceration. Mild ICA tortuosity. Left carotid system: Questionable non atheromatous wall thickening. No stenosis or ulceration. Vertebral arteries: Left dominant. Vertebral arteries are smooth and widely patent to the dura. No proximal subclavian stenosis. Skeleton: No  acute or aggressive finding.  Other neck: No incidental mass or adenopathy. Upper chest: No acute finding Review of the MIP images confirms the above findings CTA HEAD FINDINGS Anterior circulation: Symmetric ICA. Hypoplastic left A1 segment with small but present anterior communicating artery. There is short segment occlusion or critical stenosis of the left M1 segment. On axial slices, initially there is concern for of luminal filling defect, but on reformats this appears more eccentric. Given the acute and chronic MCA watershed infarcts on previous brain MRI there is some chronicity to this stenosis. Left MCA branches are under filled compared to the right. There is atherosclerotic irregularity of bilateral medium sized ACA and MCA vessels. Posterior circulation: Left vertebral artery dominance. There is moderate atherosclerotic irregularity of bilateral PCA vessels, with moderate to advanced bilateral P2 segment stenoses, more advanced on the right. Mild narrowing of the left P1 segment. Venous sinuses: Patent Anatomic variants: None significant Delayed phase: No abnormal intracranial enhancement. A call has been placed to the ordering provider. Review of the MIP images confirms the above findings IMPRESSION: 1. 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 There are acute and chronic infarcts in the left MCA distribution seen on previous brain MRI, all new since 12/05/2015. 2. Atherosclerotic irregularity of medium size vessels, most prominent in the bilateral PCA distribution. P2 segment stenoses are moderate to advanced, greater on the right. 3. Minimal if any atheromatous changes in the neck. No embolic source identified. Electronically Signed   By: Marnee Spring M.D.   On: 06/22/2017 11:19   Dg Chest 2 View  Result Date: 06/21/2017 CLINICAL DATA:  Presentation with recent stroke. EXAM: CHEST  2 VIEW COMPARISON:  06/14/2017 FINDINGS: Heart size is  normal. Chronic aortic atherosclerosis. Chronic atelectasis or scarring at the lung bases. No sign of active infiltrate, mass, effusion or collapse. IMPRESSION: Chronic basilar scarring or mild basilar atelectasis. No active disease otherwise. Electronically Signed   By: Paulina Fusi M.D.   On: 06/21/2017 15:59   Dg Chest 2 View  Result Date: 06/14/2017 CLINICAL DATA:  Acute onset of fever and cough. Lower abdominal pain. Initial encounter. EXAM: CHEST  2 VIEW COMPARISON:  Chest radiograph performed 11/17/2010 FINDINGS: The lungs are well-aerated. Mild right basilar airspace opacity raises concern for pneumonia. There is no evidence of pleural effusion or pneumothorax. The heart is mildly enlarged. No acute osseous abnormalities are seen. Clips are noted within the right upper quadrant, reflecting prior cholecystectomy. IMPRESSION: Mild right basilar airspace opacity raises concern for pneumonia. Mild cardiomegaly. Electronically Signed   By: Roanna Raider M.D.   On: 06/14/2017 22:03   Ct Head Wo Contrast  Result Date: 07/10/2017 CLINICAL DATA:  Right arm weakness. EXAM: CT HEAD WITHOUT CONTRAST TECHNIQUE: Contiguous axial images were obtained from the base of the skull through the vertex without intravenous contrast. COMPARISON:  CT scan of June 21, 2017. FINDINGS: Brain: Old infarction is seen involving the left basal ganglia. No mass effect or midline shift is noted. Ventricular size is within normal limits. There is no evidence of mass lesion, hemorrhage or acute infarction. Vascular: No hyperdense vessel or unexpected calcification. Skull: Normal. Negative for fracture or focal lesion. Sinuses/Orbits: No acute finding. Other: None. IMPRESSION: No acute intracranial abnormality seen. Electronically Signed   By: Lupita Raider, M.D.   On: 07/10/2017 14:35   Ct Head Wo Contrast  Result Date: 06/21/2017 CLINICAL DATA:  Focal neurological deficit. Suspected stroke. Greater than 6 hours onset. EXAM: CT  HEAD WITHOUT CONTRAST TECHNIQUE: Contiguous axial images were obtained from the base of the skull through the vertex without intravenous contrast. 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 Low-density but without hemorrhage or mass effect. Mild chronic small-vessel changes of the deep white matter. No mass lesion, hydrocephalus or extra-axial collection. Vascular: No abnormal vascular finding. Skull: Negative Sinuses/Orbits: Clear/normal Other: None IMPRESSION: Newly seen low-density in the left caudate head and putamen on the left consistent with late acute/ early subacute infarction. No hemorrhage. Electronically Signed   By: Paulina Fusi M.D.   On: 06/21/2017 15:58   Ct Angio Neck W Or Wo Contrast  Result Date: 07/10/2017 CLINICAL DATA:  Initial evaluation for acute onset right arm weakness. EXAM: CT ANGIOGRAPHY HEAD AND NECK TECHNIQUE: Multidetector CT imaging of the head and neck was performed using the standard protocol during bolus administration of intravenous contrast. Multiplanar CT image reconstructions and MIPs were obtained to evaluate the vascular anatomy. Carotid stenosis measurements (when applicable) are obtained utilizing NASCET criteria, using the distal internal carotid diameter as the denominator. CONTRAST:  50 cc of Isovue 370. COMPARISON:  Prior CT from earlier the same day as well as recent CTA from 06/22/2017. FINDINGS: CTA NECK FINDINGS Aortic arch: Aortic arch of normal caliber with normal branch pattern. No flow-limiting stenosis about the origin of the great vessels. Visualized subclavian artery use widely patent. Right carotid system: Right carotid artery system widely patent without stenosis, dissection, or occlusion. No significant atheromatous narrowing about the right carotid bifurcation. Left carotid system: Left carotid artery system widely patent without stenosis, dissection, or  occlusion. No significant atheromatous narrowing about the left carotid bifurcation. Vertebral arteries: Both of the vertebral arteries arise from the subclavian arteries. Vertebral artery is widely patent within the neck without stenosis, dissection, or occlusion. Left vertebral artery dominant. Skeleton: No acute osseus abnormality. No worrisome lytic or blastic osseous lesions. Stable mild multilevel degenerative spondylolysis, greatest at C5-6. Other neck: No acute soft tissue abnormality within the neck. Salivary glands normal. No adenopathy. Thyroid normal. Upper chest: Upper chest within normal limits. Calcified granuloma noted at the posterior left upper lobe. Review of the MIP images confirms the above findings CTA HEAD FINDINGS Anterior circulation: Petrous, cavernous, and supraclinoid segments of the internal carotid artery's patent with antegrade flow. Hypoplastic but patent left A1 segment. Patent right A1. Normal small anterior communicating artery. Atheromatous irregularity involving the anterior cerebral arteries without high-grade stenosis. Short-segment severe stenosis/ near occlusion of the proximal left M1 segment again noted, stable in appearance from previous. Markedly attenuated flow with decreased perfusion distally, also stable. Mild atheromatous regularity within the right M1 segment without stenosis. Right MCA bifurcation normal. Atheromatous irregularity within the distal right MCA branches which are well opacified. Posterior circulation: Vertebral artery's patent to the vertebrobasilar junction without stenosis. Left vertebral artery dominant. Patent left PICA. Basilar artery widely patent to its distal aspect. Superior cerebral arteries patent bilaterally. Both of the posterior cerebral arteries primarily supplied via the basilar. Extensive multifocal atheromatous regularity involving the bilateral PCAs again noted, slightly worse on the right. Appearance is similar to previous. Venous  sinuses: Patent. Anatomic variants: None. Delayed phase: No pathologic enhancement. Review of the MIP images confirms the above findings IMPRESSION: 1. Stable CTA of the head and neck as compared to recent exam from 06/22/2017. 2. Occlusion and/or pre occlusive stenosis involving the proximal left M1 segment with decreased perfusion distally, stable  from previous. 3. Prominent atherosclerotic irregularity involving the median sized vessels, most prominent within the bilateral PCAs, with resultant moderate to severe stenoses. 4. Essentially normal CTA of the neck with minimal atherosclerotic change for age. Electronically Signed   By: Rise Mu M.D.   On: 07/10/2017 18:51   Ct Angio Neck W Or Wo Contrast  Result Date: 06/22/2017 CLINICAL DATA:  Stroke follow-up. EXAM: CT ANGIOGRAPHY HEAD AND NECK TECHNIQUE: Multidetector CT imaging of the head and neck was performed using the standard protocol during bolus administration of intravenous contrast. Multiplanar CT image reconstructions and MIPs were obtained to evaluate the vascular anatomy. Carotid stenosis measurements (when applicable) are obtained utilizing NASCET criteria, using the distal internal carotid diameter as the denominator. CONTRAST:  50 cc Isovue 370 intravenous COMPARISON:  Brain MRI 12/04/2015 FINDINGS: CTA NECK FINDINGS Aortic arch: Probable atheromatous thickening of the wall diffusely. No acute finding. Three vessel branching. Right carotid system: Questionable non atheromatous wall thickening. No stenosis, dissection, or ulceration. Mild ICA tortuosity. Left carotid system: Questionable non atheromatous wall thickening. No stenosis or ulceration. Vertebral arteries: Left dominant. Vertebral arteries are smooth and widely patent to the dura. No proximal subclavian stenosis. Skeleton: No acute or aggressive finding. Other neck: No incidental mass or adenopathy. Upper chest: No acute finding Review of the MIP images confirms the above  findings CTA HEAD FINDINGS Anterior circulation: Symmetric ICA. Hypoplastic left A1 segment with small but present anterior communicating artery. There is short segment occlusion or critical stenosis of the left M1 segment. On axial slices, initially there is concern for of luminal filling defect, but on reformats this appears more eccentric. Given the acute and chronic MCA watershed infarcts on previous brain MRI there is some chronicity to this stenosis. Left MCA branches are under filled compared to the right. There is atherosclerotic irregularity of bilateral medium sized ACA and MCA vessels. Posterior circulation: Left vertebral artery dominance. There is moderate atherosclerotic irregularity of bilateral PCA vessels, with moderate to advanced bilateral P2 segment stenoses, more advanced on the right. Mild narrowing of the left P1 segment. Venous sinuses: Patent Anatomic variants: None significant Delayed phase: No abnormal intracranial enhancement. A call has been placed to the ordering provider. Review of the MIP images confirms the above findings IMPRESSION: 1. 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 There are acute and chronic infarcts in the left MCA distribution seen on previous brain MRI, all new since 12/05/2015. 2. Atherosclerotic irregularity of medium size vessels, most prominent in the bilateral PCA distribution. P2 segment stenoses are moderate to advanced, greater on the right. 3. Minimal if any atheromatous changes in the neck. No embolic source identified. Electronically Signed   By: Marnee Spring M.D.   On: 06/22/2017 11:19   Mr Brain Wo Contrast  Result Date: 07/10/2017 CLINICAL DATA:  Initial evaluation for acute right-sided weakness, recent stroke. EXAM: MRI HEAD WITHOUT CONTRAST TECHNIQUE: Multiplanar, multiecho pulse sequences of the brain and surrounding structures were obtained without intravenous contrast. COMPARISON:  Prior  CT from earlier the same day. Comparison also made with recent MRI from 06/22/2017. FINDINGS: Brain: There has been interval evolution of previously identified left lentiform nucleus and internal capsule infarct, with additional over riding left temporoparietal cortical infarcts, subacute in appearance on today's exam. No convincing evidence for superimposed acute ischemia on today's exam. Asymmetric chronic white matter ischemic changes with remote lacunar infarcts in the left centrum semi ovale again noted. Chronic hemosiderin staining  now seen at the left lentiform nucleus infarct. Additional chronic blood products at the watershed region of the left cerebral hemisphere again noted, unchanged. No evidence for acute intracranial hemorrhage. No mass lesion, midline shift or mass effect. No hydrocephalus. No extra-axial fluid collection. Major dural sinuses grossly patent. Vascular: Diminished left MCA territory flow void, better seen on prior CTA. Major intracranial vascular flow voids otherwise maintained. Skull and upper cervical spine: Craniocervical junction within normal limits. Upper cervical spine unremarkable. Bone marrow signal intensity normal. No scalp soft tissue abnormality. Sinuses/Orbits: Globes and orbital soft tissues within normal limits. Mild paranasal sinus mucosal thickening. Small bilateral mastoid effusions noted. Other: None. IMPRESSION: 1. Normal expected interval evolution of subacute left MCA territory infarcts, with no evidence for new/acute ischemia on today's exam. 2. Additional remote ischemic changes within the left MCA territory and watershed regions, stable. 3. Diminished left MCA flow voids, better seen on previous CTAs. Electronically Signed   By: Rise Mu M.D.   On: 07/10/2017 20:57   Mr Brain Wo Contrast  Result Date: 06/22/2017 CLINICAL DATA:  Ischemic stroke workup. EXAM: MRI HEAD WITHOUT CONTRAST TECHNIQUE: Multiplanar, multiecho pulse sequences of the brain  and surrounding structures were obtained without intravenous contrast. COMPARISON:  12/05/2015 FINDINGS: Brain: Perforator infarct on the left affecting the striatum and internal capsule anterior limb on the left. Small left temporoparietal cortex and white matter infarct. There is asymmetric chronic white matter ischemic type changes with lacunes in the left centrum semiovale. Hemosiderin staining along the cortex of the left cerebral hemisphere, roughly in the watershed regions. A coronal T2 weighted acquisition was not obtained. Vascular: Major flow voids are preserved. Less prominent left M2 flow voids. Skull and upper cervical spine: Negative for marrow lesion. Sinuses/Orbits: No acute finding. Mild partial opacification of mastoid air cells. Negative nasopharynx. IMPRESSION: 1. Acute perforator infarct affecting the left striatum and internal capsule anterior limb. Small acute white matter infarct also in the MCA territory. 2. There are remote ischemic injuries along the left MCA watershed territory. Left M2 flow voids are less apparent, both new from 2017. Recommend CTA characterization, a proximal stenosis is suspected. Electronically Signed   By: Marnee Spring M.D.   On: 06/22/2017 10:54    Microbiology: No results found for this or any previous visit (from the past 240 hour(s)).   Labs: Basic Metabolic Panel:  Recent Labs Lab 07/10/17 1246 07/10/17 1255  NA 137 139  K 3.7 3.6  CL 104 101  CO2 25  --   GLUCOSE 169* 163*  BUN 19 21*  CREATININE 1.10 1.00  CALCIUM 8.6*  --    Liver Function Tests:  Recent Labs Lab 07/10/17 1246  AST 20  ALT 22  ALKPHOS 32*  BILITOT 0.9  PROT 5.9*  ALBUMIN 3.5   No results for input(s): LIPASE, AMYLASE in the last 168 hours. No results for input(s): AMMONIA in the last 168 hours. CBC:  Recent Labs Lab 07/10/17 1246 07/10/17 1255  WBC 7.1  --   NEUTROABS 4.6  --   HGB 14.8 15.3  HCT 44.1 45.0  MCV 86.3  --   PLT 134*  --     Cardiac Enzymes: No results for input(s): CKTOTAL, CKMB, CKMBINDEX, TROPONINI in the last 168 hours. BNP: BNP (last 3 results) No results for input(s): BNP in the last 8760 hours.  ProBNP (last 3 results) No results for input(s): PROBNP in the last 8760 hours.  CBG:  Recent Labs Lab 07/10/17 2144 07/11/17  1610  GLUCAP 198* 124*       Signed:  Edsel Petrin  [facility] 07/11/2017, 2:06 PM

## 2017-07-12 LAB — VITAMIN D 25 HYDROXY (VIT D DEFICIENCY, FRACTURES): VIT D 25 HYDROXY: 7.2 ng/mL — AB (ref 30.0–100.0)

## 2017-07-24 ENCOUNTER — Ambulatory Visit (INDEPENDENT_AMBULATORY_CARE_PROVIDER_SITE_OTHER): Payer: Medicare Other | Admitting: *Deleted

## 2017-07-24 DIAGNOSIS — I639 Cerebral infarction, unspecified: Secondary | ICD-10-CM

## 2017-07-25 LAB — CUP PACEART REMOTE DEVICE CHECK
Date Time Interrogation Session: 20180920193703
MDC IDC PG IMPLANT DT: 20180822

## 2017-07-25 NOTE — Progress Notes (Signed)
[facility] Summary Report / Loop Recorder 

## 2017-07-30 ENCOUNTER — Ambulatory Visit (INDEPENDENT_AMBULATORY_CARE_PROVIDER_SITE_OTHER): Payer: Medicare Other | Admitting: Neurology

## 2017-07-30 ENCOUNTER — Encounter: Payer: Self-pay | Admitting: Neurology

## 2017-07-30 VITALS — BP 118/63 | HR 56 | Ht 65.0 in | Wt 221.0 lb

## 2017-07-30 DIAGNOSIS — I639 Cerebral infarction, unspecified: Secondary | ICD-10-CM | POA: Diagnosis not present

## 2017-07-30 NOTE — Progress Notes (Signed)
[facility] [address] [place]. [place] [address] [phone]       OFFICE FOLLOW-UP NOTE  Raymond [name] Date of Birth:  07/28/1951 Medical Record Number:  [id number]   HPI: Raymond [name] is a 1 year Caucasian male seen today for first office follow-up visit following hospital admission for stroke in August 2018. He is accompanied by his wife. History is obtained from them as well as review of electronic medical records. I have personally reviewed imaging films.Raymond [name] is an 66 y.o. male who has had multiple medical problems recently with recurrent admissions, including ear infection followed by gall bladder surgery and then pneumonia, who developed confusion and difficulty speaking on 06/21/17. He was initially seen at [facility] and was transferred to [facility] for further care. He has been more sleepy and confused. Speech deficit is described as having "a hard time getting my words out". Due to his recent ear infection, he has persisting decreased auditory acuity in his right ear. In triage, he had noticeable difficulty talking with the triage nurse. He has had "altered mental status" for "a few days" with no clearly defined time of onset hence tPA was not considered. MRI scan of the brain personally reviewed showed left middle cerebral artery branch infarct. MRA of the brain showed occlusion versus preocclusive stenosis of the left M1 and the distal portion with underfilled branches downstream.LE venous Doppler was negative for DVT. 2-D echo showed normal ejection fraction. Transesophageal echocardiogram showed no cord expansile embolism. Patient had loop recorder inserted. He was started on aspirin and Plavix. He was discharged but returned 2 weeks later with tremulousness of his right hand as well as some increase expressive aphasia and right hand weakness. Repeat MRI scan of the brain was obtained which showed no acute finding. LDL cholesterol was elevated at 112 mg  percent and hemoglobin A1c was 6.1. Patient states his speech has improved since discharge is currently doing outpatient physical and occupation therapy. Is to lessen diminished fine motor skills and weakness in his right hand. He however noticed some intermittent tremor in his right hand but this also seems to be improving. He has significant hearing loss in the right ear but does report prior history of recurrent ear infections and tinnitus. He has seen an ENT physician and plans to try a hearing aid. He is also not happy with his CPAP device and has an appointment to see Dr. Vickey Huger tomorrow to discuss CPAP settings. He is tolerating aspirin and Plavix without bruising or bleeding. Is also tolerating Lipitor without muscle aches and pains. He states is eating healthy  but has not lost weight. He plans to be more active. ROS:   14 system review of systems is positive for  tremors, hearing loss, ringing in the ears, cold intolerance, sleep apnea, snoring, confusion, decreased concentration, depression, headache, memory loss, speech difficulty, weakness and all other systems negative  PMH:  Past Medical History:  Diagnosis Date  . Arthritis   . Depression   . Diabetes mellitus type 2 in obese (HCC)   . Hypertension   . Migraines   . Sleep apnea   . Stroke [facility])     Social History:  Social History   Social History  . Marital status: Married    Spouse name: N/A  . Number of children: N/A  . Years of education: N/A   Occupational History  . Not on file.   Social History Main Topics  . Smoking status:  Light Tobacco Smoker    Types: Cigars  . Smokeless tobacco: Never Used  . Alcohol use No  . Drug use: No  . Sexual activity: Not Currently   Other Topics Concern  . Not on file   Social History Narrative  . No narrative on file    Medications:   Current Outpatient Prescriptions on File Prior to Visit  Medication Sig Dispense Refill  . acetaminophen (TYLENOL) 500 MG tablet Take  2 tablets (1,000 mg total) by mouth every 6 (six) hours as needed. (Patient taking differently: Take 1,000 mg by mouth every 6 (six) hours as needed for mild pain. ) 30 tablet 0  . aspirin (BAYER ASPIRIN) 325 MG tablet Take 1 tablet (325 mg total) by mouth daily. 360 tablet 0  . atorvastatin (LIPITOR) 20 MG tablet Take 1 tablet (20 mg total) by mouth daily at 6 PM. 30 tablet 0  . clopidogrel (PLAVIX) 75 MG tablet Take 1 tablet (75 mg total) by mouth daily. 30 tablet 0  . losartan-hydrochlorothiazide (HYZAAR) 100-25 MG tablet Take 0.5 tablets by mouth daily.    . metFORMIN (GLUCOPHAGE) 500 MG tablet Take 1 tablet (500 mg total) by mouth at bedtime.    . senna-docusate (SENOKOT-S) 8.6-50 MG tablet Take 1 tablet by mouth at bedtime as needed for mild constipation. 30 tablet 0  . verapamil (CALAN-SR) 120 MG CR tablet Take 60 mg by mouth at bedtime.      No current facility-administered medications on file prior to visit.     Allergies:   Allergies  Allergen Reactions  . Flomax [Tamsulosin Hcl] Other (See Comments)    HYPOtension and syncope    Physical Exam General: Mildly obese middle-age Caucasian male seated, in no evident distress Head: head normocephalic and atraumatic.  Neck: supple with no carotid or supraclavicular bruits Cardiovascular: regular rate and rhythm, no murmurs Musculoskeletal: no deformity Skin:  no rash/petichiae Vascular:  Normal pulses all extremities Vitals:   07/30/17 0900  BP: 118/63  Pulse: (!) 56   Neurologic Exam Mental Status: Awake and fully alert. Oriented to place and time. Recent and remote memory intact. Attention span, concentration and fund of knowledge appropriate. Mood and affect appropriate.  Cranial Nerves: Fundoscopic exam reveals sharp disc margins. Pupils equal, briskly reactive to light. Extraocular movements full without nystagmus. Visual fields full to confrontation. Hearing diminished in the right ear with conductive hearing loss. Facial  sensation intact. Face, tongue, palate moves normally and symmetrically.  Motor: Normal bulk and tone. Normal strength in all tested extremity muscles.Diminished fine finger movements in the right hand. Orbits left over right upper extremity. Minimum right grip weakness. No tremor noted of the outstretched upper extremity. No cogwheel rigidity or bradykinesia. Sensory.: intact to touch ,pinprick .position and vibratory sensation.  Coordination: Rapid alternating movements normal in all extremities. Finger-to-nose and heel-to-shin performed accurately bilaterally. Gait and Station: Arises from chair without difficulty. Stance is normal. Gait demonstrates normal stride length and balance . Able to heel, toe and tandem walk with slight difficulty.  Reflexes: 1+ and symmetric. Toes downgoing.   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 Vascular risk factors of diabetes, hypertension, hyperlipidemia, obesity and sleep apnea    PLAN: I had a long d/w patient about his recent stroke, risk for recurrent stroke/TIAs, personally independently reviewed imaging studies and stroke evaluation results and answered questions.Continue aspirin 325 mg daily  and clopidogrel 75 mg daily  for secondary stroke prevention for 2 more months and then discontinue clopidogrel and stay on aspirin alone and maintain strict control of hypertension with blood pressure goal below 130/90, diabetes with hemoglobin A1c goal below 6.5% and lipids with LDL cholesterol goal below 70 mg/dL. I also advised the patient to eat a healthy diet with plenty of whole grains, cereals, fruits and vegetables, exercise regularly and maintain ideal body weight. Patient is encouraged to try a hearing aid to help his hearing which appears to be conductive hearing loss and is not related to his stroke in my opinion. He may also consider possible  participation in the PREMIERS stroke trial if interested. He was encouraged to keep his upcoming appointment with Dr. Vickey Huger to try the new CPAP for his sleep apnea Followup in the future with my nurse practitioner in 6 months or call earlier if necessary.  Greater than 50% of time during this 25 minute visit was spent on counseling,explanation of diagnosis cryptogenic stroke, intracranial stenosis risk factor modification, planning of further management, discussion with patient and family and coordination of care Delia Heady, MD  [facility] [address] [place], [place] [address]  Phone [phone] Fax [phone] Note: This document was prepared with digital dictation and possible smart phrase technology. Any transcriptional errors that result from this process are unintentional

## 2017-07-30 NOTE — Patient Instructions (Signed)
I had a long d/w patient about his recent stroke, risk for recurrent stroke/TIAs, personally independently reviewed imaging studies and stroke evaluation results and answered questions.Continue aspirin 325 mg daily and clopidogrel 75 mg daily  for secondary stroke prevention for 2 more months and then discontinue clopidogrel and stay on aspirin alone and maintain strict control of hypertension with blood pressure goal below 130/90, diabetes with hemoglobin A1c goal below 6.5% and lipids with LDL cholesterol goal below 70 mg/dL. I also advised the patient to eat a healthy diet with plenty of whole grains, cereals, fruits and vegetables, exercise regularly and maintain ideal body weight. Patient is encouraged to try a hearing aid to help his hearing which appears to be conductive hearing loss and is not related to his stroke in my opinion. He may also consider possible participation in the PREMIERS stroke trial if interested. He was encouraged to keep his upcoming appointment with Dr. Vickey Huger to try the new CPAP for his sleep apnea Followup in the future with my nurse practitioner in 6 months or call earlier if necessary.  Stroke Prevention Some medical conditions and behaviors are associated with an increased chance of having a stroke. You may prevent a stroke by making healthy choices and managing medical conditions. How can I reduce my risk of having a stroke?  Stay physically active. Get at least 30 minutes of activity on most or all days.  Do not smoke. It may also be helpful to avoid exposure to secondhand smoke.  Limit alcohol use. Moderate alcohol use is considered to be: ? No more than 2 drinks per day for men. ? No more than 1 drink per day for nonpregnant women.  Eat healthy foods. This involves: ? Eating 5 or more servings of fruits and vegetables a day. ? Making dietary changes that address high blood pressure (hypertension), high cholesterol, diabetes, or obesity.  Manage your  cholesterol levels. ? Making food choices that are high in fiber and low in saturated fat, trans fat, and cholesterol may control cholesterol levels. ? Take any prescribed medicines to control cholesterol as directed by your health care provider.  Manage your diabetes. ? Controlling your carbohydrate and sugar intake is recommended to manage diabetes. ? Take any prescribed medicines to control diabetes as directed by your health care provider.  Control your hypertension. ? Making food choices that are low in salt (sodium), saturated fat, trans fat, and cholesterol is recommended to manage hypertension. ? Ask your health care provider if you need treatment to lower your blood pressure. Take any prescribed medicines to control hypertension as directed by your health care provider. ? If you are 58-6 years of age, have your blood pressure checked every 3-5 years. If you are 30 years of age or older, have your blood pressure checked every year.  Maintain a healthy weight. ? Reducing calorie intake and making food choices that are low in sodium, saturated fat, trans fat, and cholesterol are recommended to manage weight.  Stop drug abuse.  Avoid taking birth control pills. ? Talk to your health care provider about the risks of taking birth control pills if you are over 13 years old, smoke, get migraines, or have ever had a blood clot.  Get evaluated for sleep disorders (sleep apnea). ? Talk to your health care provider about getting a sleep evaluation if you snore a lot or have excessive sleepiness.  Take medicines only as directed by your health care provider. ? For some people, aspirin or blood  thinners (anticoagulants) are helpful in reducing the risk of forming abnormal blood clots that can lead to stroke. If you have the irregular heart rhythm of atrial fibrillation, you should be on a blood thinner unless there is a good reason you cannot take them. ? Understand all your medicine  instructions.  Make sure that other conditions (such as anemia or atherosclerosis) are addressed. Get help right away if:  You have sudden weakness or numbness of the face, arm, or leg, especially on one side of the body.  Your face or eyelid droops to one side.  You have sudden confusion.  You have trouble speaking (aphasia) or understanding.  You have sudden trouble seeing in one or both eyes.  You have sudden trouble walking.  You have dizziness.  You have a loss of balance or coordination.  You have a sudden, severe headache with no known cause.  You have new chest pain or an irregular heartbeat. Any of these symptoms may represent a serious problem that is an emergency. Do not wait to see if the symptoms will go away. Get medical help at once. Call your local emergency services (911 in [place] Do not drive yourself to the hospital. This information is not intended to replace advice given to you by your health care provider. Make sure you discuss any questions you have with your health care provider. Document Released: 11/28/2004 Document Revised: 03/28/2016 Document Reviewed: 04/23/2013 Elsevier Interactive Patient Education  2017 [employer].

## 2017-07-31 ENCOUNTER — Ambulatory Visit (INDEPENDENT_AMBULATORY_CARE_PROVIDER_SITE_OTHER): Payer: Medicare Other | Admitting: Neurology

## 2017-07-31 ENCOUNTER — Encounter: Payer: Self-pay | Admitting: Neurology

## 2017-07-31 VITALS — BP 138/75 | HR 53 | Ht 65.0 in | Wt 221.0 lb

## 2017-07-31 DIAGNOSIS — I639 Cerebral infarction, unspecified: Secondary | ICD-10-CM | POA: Diagnosis not present

## 2017-07-31 DIAGNOSIS — G4733 Obstructive sleep apnea (adult) (pediatric): Secondary | ICD-10-CM

## 2017-07-31 DIAGNOSIS — I63512 Cerebral infarction due to unspecified occlusion or stenosis of left middle cerebral artery: Secondary | ICD-10-CM | POA: Diagnosis not present

## 2017-07-31 DIAGNOSIS — Z9989 Dependence on other enabling machines and devices: Secondary | ICD-10-CM

## 2017-07-31 DIAGNOSIS — G44019 Episodic cluster headache, not intractable: Secondary | ICD-10-CM

## 2017-07-31 DIAGNOSIS — H903 Sensorineural hearing loss, bilateral: Secondary | ICD-10-CM

## 2017-07-31 NOTE — Patient Instructions (Signed)

## 2017-07-31 NOTE — Progress Notes (Signed)
SLEEP MEDICINE CLINIC   Provider:  Melvyn [name], M D  Primary Care Physician:    Referring Provider:  Delia Heady, MD STROKE SERVICE    Chief Complaint  Patient presents with  . New Patient (Initial Visit)    pt is a cpap user, had a sleep study 7-8 years ago.     HPI:  Raymond [name] is a 66 y.o. male , seen here as in a referral  from Dr. Pearlean [name] at [facility], who has seen this patient for stroke twice.  Today's 07/31/2017, I have the pleasure of meeting [name]. Raymond [name] has seen Raymond [name] twice on the stroke service at [facility], he was  admitted on 06/22/2017 and discharged on the 21st. - This was followed by a second admission between the 14th and 15th of September of this year, 12 days ago. 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 There were also small infarctions throughout the MCA territory. In addition remote watershed territory infarctions are noted. From 2017. Proximal stenosis of was suspected. CTA followed. 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 There is generalized arthrosclerotic irregularity of vessels in medium size [name]. P2 segment is showing an advanced stenosis greater on the right. The patient is a lower extremity venous Doppler which was normal, he had a 2-D echocardiogram which was normal, a TEE was pending at the time. Vital signs on August 21 documented hypertension but normal oxygenation. Doctor Raymond [name]  referred the patient for a new sleep evaluation. He was aware that the patient has a history of obstructive sleep apnea and has been on CPAP for 6 years.   In addition the patient carries a diagnosis of migraine, poorly controlled hypertension, diabetes mellitus,  hearing loss, repeated ear infection, cholecystectomy, pneumonia. He still felt that the stroke may have possibly been cardioembolic differential diagnosis included atrial fibrillation, PFO with DVT or endocarditis. The and health have drastically changed since July 31, when he first developed an ear infection, followed by hearing loss, one week later he had a cholecystectomy, in early August, and pneumonia,  in 7 days developed stroke symptoms!   A TEE was finally performed and returned normal, the patient was discharged on aspirin 300 2520 g daily and Plavix 75 mg and continued to use the APTT for the next 3 months. The patient is currently a loop recorder implant.  I would like to "his previous polysomnography from 11/29/2010, the patient had moderate to loud snoring moderate obstructive sleep apnea with an AHI of 21 per hour of sleep. Non-positional oxygen nadir was 84%. Interpreted by Raymond [name]. Also Axis II the patient's current CPAP download he has been highly compliant CPAP user was 93% compliance average user time is 9 hours and 6 minutes CPAP is set at 10 cm water pressure with 2 cm EPR full-time, residual AHI is excellent at only 0.4 he does have occasional high air leaks on nasal mask .   Sleep habits are as follows: The patient's routine includes watching TV before he goes to bed, bedtime is usually around 11 PM. The bedroom is cool and quiet and dark. He uses CPAP regularly. He will stay asleep for 2 hours before he works  wakes up first, usually to go to the bathroom. He will go back to see the point another 2 hours and again will have nocturia several times at night. His wife reports that he does not snore when using CPAP, but she has observed several air leaks. The patient prefers to sleep on his left side. His rising time in the morning is 9 AM. He is retired and does not have work schedule to follow. He does not feel as refreshed in the morning now as he did when he first started  using CPAP. He wakes up with headaches, he has a long history of chronic migraines and has been woken by headaches. Possible episodic cluster headache..   Sleep medical history and family sleep history:  OSA , on CPAP compliant. Currently good response. One daughter uses CPAP. No sleep walking history for the patient , but daughter had night terrors.   Social history:  Married, 2 daughters and 4 grandchildren. He smokes cigars. Rare alcohol, caffeine - coffee 6-8 a day, some tea, no sodas.  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   Review of Systems: Out of a complete 14 system review, the patient complains of only the following symptoms, and all other reviewed systems are negative. Snoring and air leaks , fatige, depression. Hearing loss.   Epworth score fatigue severity score endorsed at 45, Epworth sleepiness score endorsed at 7 points , depression score 8.  Social History   Social History  . Marital status: Married    Spouse name: N/A  . Number of children: N/A  . Years of education: N/A   Occupational History  . Not on file.   Social History Main Topics  . Smoking status: Light Tobacco Smoker    Types: Cigars  . Smokeless tobacco: Never Used  . Alcohol use No  . Drug use: No  . Sexual activity: Not Currently   Other Topics Concern  . Not on file   Social History Narrative  . No narrative on file    Family History  Problem Relation Age of Onset  . Other Mother        reports in good health  . Other Father        was estranged from family, unknown medical history    Past Medical History:  Diagnosis Date  . Arthritis   . Depression   . Diabetes mellitus type 2 in obese (HCC)   . Hypertension   . Migraines   . Sleep apnea   . Stroke [facility])     Past Surgical History:  Procedure Laterality Date  . CHOLECYSTECTOMY    . KNEE ARTHROSCOPY    . LOOP RECORDER INSERTION N/A 06/24/2017   Procedure: LOOP RECORDER INSERTION;  Surgeon:  Raymond Range, MD;  Location: [facility] INVASIVE CV LAB;  Service: Cardiovascular;  Laterality: N/A;  . ROTATOR CUFF REPAIR    . TEE WITHOUT CARDIOVERSION N/A 06/24/2017   Procedure: TRANSESOPHAGEAL ECHOCARDIOGRAM (TEE) WITH LOOP;  Surgeon: Elease Hashimoto Deloris Ping, MD;  Location: [facility] ENDOSCOPY;  Service: Cardiovascular;  Laterality: N/A;    Current Outpatient Prescriptions  Medication Sig Dispense Refill  . acetaminophen (TYLENOL) 500 MG tablet Take 2 tablets (1,000 mg total) by mouth every 6 (six) hours as needed. (Patient taking differently: Take 1,000 mg by mouth every 6 (six) hours as needed for mild pain. ) 30 tablet 0  . aspirin (BAYER ASPIRIN) 325 MG tablet Take 1 tablet (325 mg total) by mouth daily. 360  tablet 0  . atorvastatin (LIPITOR) 20 MG tablet Take 1 tablet (20 mg total) by mouth daily at 6 PM. 30 tablet 0  . clopidogrel (PLAVIX) 75 MG tablet Take 1 tablet (75 mg total) by mouth daily. 30 tablet 0  . hydrOXYzine (ATARAX/VISTARIL) 50 MG tablet Take 50 mg by mouth every 6 (six) hours as needed.     Marland Kitchen levocetirizine (XYZAL) 5 MG tablet as needed.     Marland Kitchen losartan-hydrochlorothiazide (HYZAAR) 100-25 MG tablet Take 0.5 tablets by mouth daily.    . metFORMIN (GLUCOPHAGE) 500 MG tablet Take 1 tablet (500 mg total) by mouth at bedtime.    . propranolol (INDERAL) 20 MG tablet Take 20 mg by mouth.    . senna-docusate (SENOKOT-S) 8.6-50 MG tablet Take 1 tablet by mouth at bedtime as needed for mild constipation. 30 tablet 0  . sertraline (ZOLOFT) 100 MG tablet Take 100 mg by mouth daily.     . verapamil (CALAN-SR) 120 MG CR tablet Take 60 mg by mouth at bedtime.      No current facility-administered medications for this visit.     Allergies as of 07/31/2017 - Review Complete 07/31/2017  Allergen Reaction Noted  . Flomax [tamsulosin hcl] Other (See Comments) 10/20/2012    Vitals: BP 138/75   Pulse (!) 53   Ht  (1.651 m)   Wt 221 lb (100.2 kg)   BMI 36.78 kg/m  Last Weight:  Wt Readings  from Last 1 Encounters:  07/31/17 221 lb (100.2 kg)   ZOX:WRUE mass index is 36.78 kg/m.     Last Height:   Ht Readings from Last 1 Encounters:  07/31/17  (1.651 m)    Physical exam:  General: The patient is awake, alert and appears not in acute distress. The patient is well groomed. Head: Normocephalic, atraumatic. Neck is supple. Mallampati 5,  neck circumference:18 . Nasal airflow patent , TMJ click evident . Cardiovascular:  Regular rate and rhythm, without  murmurs or carotid bruit, and without distended neck veins. Respiratory: Lungs are clear to auscultation. Skin:  Without evidence of edema, or rash Trunk: BMI is 37. The patient's posture is leaning right.  Neurologic exam : The patient is awake and alert, oriented to place and time.   Memory subjective described as intact.  Attention span & concentration ability appears normal.  Speech is fluent,  without dysarthria, dysphonia or aphasia.  Mood and affect are appropriate.  Cranial nerves: Pupils are equal and briskly reactive to light. Funduscopic exam deferred - see Dr Marlis [name] note form yesterday . Extraocular movements  in vertical and horizontal planes intact and without nystagmus. Visual fields by finger perimetry are intact. Hearing impaired-   Facial sensation intact to fine touch.  Facial motor strength is asymmetric, right facial droop  and tongue and uvula move midline. Shoulder shrug was symmetrical.   Motor exam:   Normal tone, muscle bulk and symmetric strength in all extremities.  Sensory:  Fine touch, pinprick and vibration were tested in all extremities. Proprioception tested in the upper extremities was normal.  Coordination: Rapid alternating movements in the fingers/hands was slowed on the right- handwriting severely affected. Finger-to-nose maneuver ab- normal with  dysmetria and tremor on the right .  Gait and station: Patient walks without assistive device Turns with 4 Steps.   Deep tendon  reflexes: in the  upper and lower left extremities are intact, right side brisk. . Babinski maneuver response is up-going on the right. Marland Kitchen  Assessment:  After physical and neurologic examination, review of laboratory studies,  Personal review of imaging studies, reports of other /same  Imaging studies, results of polysomnography and / or neurophysiology testing and pre-existing records as far as provided in visit. I appreciate Dr. Rockey Situ notes from yesterday, and insight into the patient's previous sleep study, the previously last laboratory results, and his current CPAP downloads.  Based on the review of the patient's medical chart and images as well as vascular studies and cardiovascular studies it appears eminent that he has to continue using CPAP. His current machine is 68 years old but seems to have well function, he has also been able to reduce his AHI to 0.4 which is an excellent result. I would like to retest him however and refit him for a mask as he has significant air leaks. We would usually furbish the patient is an auto titration capable device so that future weight - weight changes will be automatically buffered by CPAP setting.  1) SPLIT night , split at AHI 20- look out for hypercapnia and hypoxia.   2) Follow up with dr Raymond [name]  or NP after Loop recording- implanted for 3 years.    3) Follow up with me after sleep study.   The patient was advised of the nature of the diagnosed disorder , the treatment options and the  risks for general health and wellness arising from not treating the condition.   I spent more than 45 minutes of face to face time with the patient.  Greater than 50% of time was spent in counseling and coordination of care. We have discussed the diagnosis and differential and I answered the patient's questions.    Raymond Novas, MD   Raymond Novas, MD 07/31/2017, 9:27 AM  Certified in Neurology by ABPN Certified in Sleep Medicine by [facility] [address] [place], [place] [address]

## 2017-08-18 ENCOUNTER — Ambulatory Visit (INDEPENDENT_AMBULATORY_CARE_PROVIDER_SITE_OTHER): Payer: Medicare Other | Admitting: Neurology

## 2017-08-18 DIAGNOSIS — E66812 Obesity, class 2: Secondary | ICD-10-CM

## 2017-08-18 DIAGNOSIS — G4733 Obstructive sleep apnea (adult) (pediatric): Secondary | ICD-10-CM

## 2017-08-18 DIAGNOSIS — Z6836 Body mass index (BMI) 36.0-36.9, adult: Principal | ICD-10-CM

## 2017-08-18 DIAGNOSIS — I63512 Cerebral infarction due to unspecified occlusion or stenosis of left middle cerebral artery: Secondary | ICD-10-CM

## 2017-08-18 DIAGNOSIS — Z9989 Dependence on other enabling machines and devices: Secondary | ICD-10-CM

## 2017-08-18 DIAGNOSIS — H903 Sensorineural hearing loss, bilateral: Secondary | ICD-10-CM

## 2017-08-18 DIAGNOSIS — E6609 Other obesity due to excess calories: Secondary | ICD-10-CM

## 2017-08-18 DIAGNOSIS — G44019 Episodic cluster headache, not intractable: Secondary | ICD-10-CM

## 2017-08-21 ENCOUNTER — Telehealth: Payer: Self-pay | Admitting: Neurology

## 2017-08-21 NOTE — Procedures (Signed)
PATIENT'S NAME:  Raymond [name], [name] DOB:      1951/10/07      MR#:    [id number]     DATE OF RECORDING: 08/18/2017 REFERRING M.D.:  Forrest MoronStephen Ruehle, MD Study Performed:  Split-Night Titration Study HISTORY: This patient carries a diagnosis of migraine, poorly controlled hypertension, diabetes mellitus, hearing loss, repeated ear infection, cholecystectomy and pneumonia. He still felt that the stroke may have possibly been cardio embolic differential diagnosis included atrial fibrillation, PFO with DVT or endocarditis. Wellbeing and health have drastically changed since July 31, when he first developed an ear infection, followed by hearing loss, one week later he had a cholecystectomy, in early August, and pneumonia, and in the following 7 days developed stroke symptoms!    Mr. Freida [name] has seen Dr. Pearlean BrownieSethi twice on the stroke service at [name], he was  admitted on 06/22/2017 and discharged on the 21st. - This was followed by a second admission between the 14th and 15th of September of this year, 12 days ago.  I am quoting from Dr. 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 In addition, remote watershed territory infarctions are noted. The patient endorsed the Epworth Sleepiness Scale at 7/24 points   The patient's weight 221 pounds with a height of 65 (inches), resulting in a BMI of 36.7 kg/m2. The patient's neck circumference measured 18 inches.  CURRENT MEDICATIONS: Acetaminophen, Aspirin, Atorvastatin, Clopidogrel, Hydroxyzine, Levocetirizine, Losartan-Hydrochlorothiazide, Metformin, Propranolol, Senna-Docusate, Sertraline and Verapamil  PROCEDURE:  This is a multichannel digital polysomnogram utilizing the Somnostar 11.2 system.  Electrodes and  sensors were applied and monitored per AASM Specifications.   EEG, EOG, Chin and Limb EMG, were sampled at 200 Hz.  ECG, Snore and Nasal Pressure, Thermal Airflow, Respiratory Effort, CPAP Flow and Pressure, Oximetry was sampled at 50 Hz. Digital video and audio were recorded.      BASELINE STUDY WITHOUT CPAP RESULTS: Lights Out was at 22:38 and Lights On at 05:13.  Total recording time (TRT) was 208.5, with a total sleep time (TST) of 120 minutes.   The patient's sleep latency was 44.5 minutes.  REM latency was 0 minutes.  The sleep efficiency was 57.6 %.    SLEEP ARCHITECTURE: WASO (Wake after sleep onset) was 41.5 minutes, Stage N1 was 11.5 minutes, Stage N2 was 108.5 minutes, Stage N3 was 0 minutes and Stage R (REM sleep) was 0 minutes.  The percentages were Stage N1 9.6%, Stage N2 90.4%, Stage N3 0% and Stage R (REM sleep) 0%.   RESPIRATORY ANALYSIS:  There were a total of 47 respiratory events:  0 obstructive apneas, 0 central apneas and 0 mixed apneas with a total of 0 apneas and an apnea index (AI) of 0. There were 47 hypopneas with a hypopnea index of 23.5. The patient also had 0 respiratory event related arousals (RERAs).  Snoring was noted.     The total APNEA/HYPOPNEA INDEX (AHI) was 23.5 /hour and the total RESPIRATORY DISTURBANCE INDEX was 23.5 /hour.  0 events occurred in REM sleep and 94 events in NREM. The REM AHI was 0, /hour versus a non-REM AHI of 23.5 /hour. The patient spent 46 minutes sleep time in the supine position 243 minutes in non-supine. The supine AHI was 49.6 /hour versus a non-supine AHI  of 7.3 /hour.  OXYGEN SATURATION & C02:  The wake baseline 02 saturation was 97%, with the lowest being 89%. Time spent below 89% saturation equaled 0 minutes. PERIODIC LIMB MOVEMENTS:   The patient had a total of 280 Periodic Limb Movements.  The Periodic Limb Movement (PLM) index was 140. /hour and the PLM Arousal index was 24.5 /hour.  The arousals were noted as: 6 were spontaneous,  49 were associated with PLMs, and 45 were associated with respiratory events.  Audio and video analysis did not show any abnormal complex movements, behaviors, phonations or vocalizations. The patient took two bathroom breaks. Snoring was noted. EKG was in keeping with normal sinus rhythm (NSR)  TITRATION STUDY WITH CPAP RESULTS:  CPAP was initiated at 5 cmH20 with heated humidity per AASM split night standards and pressure was advanced to 7/7 cmH20 because of hypopneas, apneas and desaturations.  At a PAP pressure of 7 cmH20, there was a reduction of the AHI to 0.0 /hour. Sleep time under this pressure was 55.5 minutes with 95% efficiency and SpO2 Nadir of 94%  Total recording time (TRT) was 187 minutes, with a total sleep time (TST) of 168.5 minutes. The patient's sleep latency was 10 minutes. REM latency was 153 minutes.  The sleep efficiency was 90.1 %.    SLEEP ARCHITECTURE: Wake after sleep was 8 minutes, Stage N1 5.5 minutes, Stage N2 141 minutes, Stage N3 0 minutes and Stage R (REM sleep) 22 minutes. The percentages were: Stage N1 3.3%, Stage N2 83.7%, Stage N3 0% and Stage R (REM sleep) 13.1%. The sleep architecture was notable for REM rebound and sustained sleep. The arousals were now noted as: 12 were spontaneous, 0 were associated with PLMs, and 0 were associated with respiratory events.  RESPIRATORY ANALYSIS:  There were a total of 0 respiratory events. The patient also had 0 respiratory event related arousals (RERAs).     The total APNEA/HYPOPNEA INDEX (AHI) was 0.0 /hour .The patient spent 0% of total sleep time in the supine position. The supine AHI was 0.0 /hour, versus a non-supine AHI of 0.0/hour.  OXYGEN SATURATION & C02:  The wake baseline 02 saturation was 96%, with the lowest being 92%. Time spent below 89% saturation equaled 0 minutes.  PERIODIC LIMB MOVEMENTS:    The patient had a total of 5 Periodic Limb Movements. The Periodic Limb Movement (PLM) index was 1.8 /hour and  the PLM Arousal index was 0.0 /hour. Post-study, the patient indicated that sleep was the same as usual.    POLYSOMNOGRAPHY IMPRESSION :   1. Severe Obstructive Sleep Apnea(OSA) and severe PLM disorder during diagnostic part of this SPLIT protocol study responded both to CPAP titration to only 7 cm water pressure.  2. Primary Snoring alleviated under CPAP.  3. Insomnia and sleep fragmentation resolved under CPAP use.     RECOMMENDATIONS:  1. Advise to start auto-titration capable CPAP at 7 cm water with 1 cm EPR and follow clinical symptomatology.  The patient provided his own mask. Advise to add heated humidity.  Adjust interface and heated humidity as needed.     2. Compliance to PAP therapy should be emphasized.  Compliance, AHI and air leak information to be downloaded for objective assessment at 30 days, 180 days and annually thereafter.   3. Further information regarding OSA may be obtained from [employer] (www.sleepfoundation.org) or American Sleep Apnea Association (www.sleepapnea.org). 4. A follow up appointment will be scheduled in the Sleep Clinic at [facility].  I certify that I have reviewed the entire raw data recording prior to the issuance of this report in accordance with the Standards of Accreditation of the American Academy of Sleep Medicine (AASM)    Melvyn Novas, M.D.   08-21-2017  Diplomat, American Board of Psychiatry and Neurology  Diplomat, American Board of Sleep Medicine Medical Director, [place] [facility]

## 2017-08-21 NOTE — Telephone Encounter (Signed)
-----   Message from Melvyn Novasarmen Dohmeier, MD sent at 08/21/2017 12:31 PM EDT ----- Apnea is still present and is severe, PLM disorder was present. Both conditions did well under CPAP, 7 cm water resolved AHI to 0.0 and PLMs to 0.0, the patient used his own mask. Please inquire if he needs his machine reset or is in want of a new CPAP.   Cc Dr Levora Angeluehle.

## 2017-08-21 NOTE — Addendum Note (Signed)
Addended by: Melvyn NovasHMEIER, Primo Innis on: 08/21/2017 12:31 PM   Modules accepted: Orders

## 2017-08-21 NOTE — Telephone Encounter (Signed)
Called the patient to let him know the sleep study results. Pt didn't answer. LVM for the patient to return call.

## 2017-08-25 ENCOUNTER — Ambulatory Visit (INDEPENDENT_AMBULATORY_CARE_PROVIDER_SITE_OTHER): Payer: Medicare Other | Admitting: *Deleted

## 2017-08-25 DIAGNOSIS — I639 Cerebral infarction, unspecified: Secondary | ICD-10-CM | POA: Diagnosis not present

## 2017-08-25 NOTE — Progress Notes (Signed)
[facility] Summary Report / Loop Recorder 

## 2017-08-25 NOTE — Telephone Encounter (Signed)
I called pt and wife answered. PER DPR I discussed result with her. I advised pt that Dr. Vickey Hugerohmeier reviewed their sleep study results and found that pt sleep apnea. Dr. Vickey Hugerohmeier recommends that pt starts a new CPAP. I reviewed PAP compliance expectations with the pt. Pt is agreeable to starting a CPAP. I advised pt that an order will be sent to a DME, [facility], and [facility] will call the pt within about one week after they file with the pt's insurance. [facility] will show the pt how to use the machine, fit for masks, and troubleshoot the CPAP if needed. A follow up appt was made for insurance purposes with Darrol Angelarolyn Martin, NP on Nov 24 2016 at 8:15 am . Pt verbalized understanding to arrive 15 minutes early and bring their CPAP. A letter with all of this information in it will be mailed to the pt as a reminder. I verified with the pt that the address we have on file is correct. Pt verbalized understanding of results. Pt had no questions at this time but was encouraged to call back if questions arise.

## 2017-08-27 LAB — CUP PACEART REMOTE DEVICE CHECK
Date Time Interrogation Session: 20181020193614
MDC IDC PG IMPLANT DT: 20180822

## 2017-09-22 ENCOUNTER — Ambulatory Visit (INDEPENDENT_AMBULATORY_CARE_PROVIDER_SITE_OTHER): Payer: Medicare Other | Admitting: *Deleted

## 2017-09-22 DIAGNOSIS — I639 Cerebral infarction, unspecified: Secondary | ICD-10-CM

## 2017-09-23 NOTE — Progress Notes (Signed)
[facility] Summary Report / Loop Recorder 

## 2017-10-09 LAB — CUP PACEART REMOTE DEVICE CHECK
MDC IDC PG IMPLANT DT: 20180822
MDC IDC SESS DTM: 20181119203651

## 2017-10-22 ENCOUNTER — Ambulatory Visit (INDEPENDENT_AMBULATORY_CARE_PROVIDER_SITE_OTHER): Payer: Medicare Other | Admitting: *Deleted

## 2017-10-22 DIAGNOSIS — I639 Cerebral infarction, unspecified: Secondary | ICD-10-CM | POA: Diagnosis not present

## 2017-10-23 NOTE — Progress Notes (Signed)
[facility] Summary Report / Loop Recorder 

## 2017-11-05 LAB — CUP PACEART REMOTE DEVICE CHECK
Date Time Interrogation Session: 20181219203850
Implantable Pulse Generator Implant Date: 20180822

## 2017-11-21 ENCOUNTER — Ambulatory Visit (INDEPENDENT_AMBULATORY_CARE_PROVIDER_SITE_OTHER): Payer: Medicare Other | Admitting: *Deleted

## 2017-11-21 DIAGNOSIS — I639 Cerebral infarction, unspecified: Secondary | ICD-10-CM

## 2017-11-23 NOTE — Progress Notes (Deleted)
[facility]  PATIENT: Raymond [name] DOB: 02-17-1951   REASON FOR VISIT: *** HISTORY FROM:    HISTORY OF PRESENT ILLNESS:Raymond [name] is a 67 y.o. male , seen here as in a referral  [facility], who has seen this patient for stroke twice.  Today's 07/31/2017, I have the pleasure of meeting [name]. Raymond [name] has seen Dr. Meryl DareSadie twice on the stroke service at [facility], he was  admitted on 06/22/2017 and discharged on the 21st. - This was followed by a second admission between the 14th and 15th of September of this year, 12 days ago. I am quoting from Dr. 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 There were also small infarctions throughout the MCA territory. In addition remote watershed territory infarctions are noted. From 2017. Proximal stenosis of was suspected. CTA followed. 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 There is generalized arthrosclerotic irregularity of vessels in medium size range. P2 segment is showing an advanced stenosis greater on the right. The patient is a lower extremity venous Doppler which was normal, he had a 2-D echocardiogram which was normal, a TEE was pending at the time. Vital signs on August 21 documented hypertension but normal oxygenation. Doctor Pearlean BrownieSethi  referred the patient for a new sleep evaluation. He was aware that the patient has a history of obstructive sleep apnea and has been on CPAP for 6 years.   In addition the patient carries a diagnosis of migraine, poorly controlled hypertension, diabetes mellitus, hearing loss, repeated ear infection, cholecystectomy, pneumonia. He still felt that the stroke may have possibly been cardioembolic  differential diagnosis included atrial fibrillation, PFO with DVT or endocarditis. The and health have drastically changed since July 31, when he first developed an ear infection, followed by hearing loss, one week later he had a cholecystectomy, in early August, and pneumonia,  in 7 days developed stroke symptoms! A TEE was finally performed and returned normal, the patient was discharged on aspirin 300 2520 g daily and Plavix 75 mg and continued to use the APTT for the next 3 months. The patient is currently a loop recorder implant.  I would like to "his previous polysomnography from 11/29/2010, the patient had moderate to loud snoring moderate obstructive sleep apnea with an AHI of 21 per hour of sleep. Non-positional oxygen nadir was 84%. Interpreted by Dr. Jetty Duhamellinton Young. Also Axis II the patient's current CPAP download he has been highly compliant CPAP user was 93% compliance average user time is 9 hours and 6 minutes CPAP is set at 10 cm water pressure with 2 cm EPR full-time, residual AHI is excellent at only 0.4 he does have occasional high air leaks on nasal mask .   Sleep habits are as follows: The patient's routine includes watching TV before he goes to bed, bedtime is usually around 11 PM. The bedroom is cool and quiet and dark. He uses CPAP regularly. He will stay asleep for 2 hours before he works wakes up first, usually to go to the bathroom. He will go back to see the point another 2 hours and again will have nocturia several times at night. His wife reports that he does not snore when using CPAP,  but she has observed several air leaks. The patient prefers to sleep on his left side. His rising time in the morning is 9 AM. He is retired and does not have work schedule to follow. He does not feel as refreshed in the morning now as he did when he first started using CPAP. He wakes up with headaches, he has a long history of chronic migraines and has been woken by headaches. Possible episodic  cluster headache.Marland Kitchen   REVIEW OF SYSTEMS: Full 14 system review of systems performed and notable only for those listed, all others are neg:  Constitutional: neg  Cardiovascular: neg Ear/Nose/Throat: neg  Skin: neg Eyes: neg Respiratory: neg Gastroitestinal: neg  Hematology/Lymphatic: neg  Endocrine: neg Musculoskeletal:neg Allergy/Immunology: neg Neurological: neg Psychiatric: neg Sleep : neg   ALLERGIES: Allergies  Allergen Reactions  . Flomax [Tamsulosin Hcl] Other (See Comments)    HYPOtension and syncope    HOME MEDICATIONS: Outpatient Medications Prior to Visit  Medication Sig Dispense Refill  . acetaminophen (TYLENOL) 500 MG tablet Take 2 tablets (1,000 mg total) by mouth every 6 (six) hours as needed. (Patient taking differently: Take 1,000 mg by mouth every 6 (six) hours as needed for mild pain. ) 30 tablet 0  . aspirin (BAYER ASPIRIN) 325 MG tablet Take 1 tablet (325 mg total) by mouth daily. 360 tablet 0  . atorvastatin (LIPITOR) 20 MG tablet Take 1 tablet (20 mg total) by mouth daily at 6 PM. 30 tablet 0  . clopidogrel (PLAVIX) 75 MG tablet Take 1 tablet (75 mg total) by mouth daily. 30 tablet 0  . hydrOXYzine (ATARAX/VISTARIL) 50 MG tablet Take 50 mg by mouth every 6 (six) hours as needed.     Marland Kitchen levocetirizine (XYZAL) 5 MG tablet as needed.     Marland Kitchen losartan-hydrochlorothiazide (HYZAAR) 100-25 MG tablet Take 0.5 tablets by mouth daily.    . metFORMIN (GLUCOPHAGE) 500 MG tablet Take 1 tablet (500 mg total) by mouth at bedtime.    . propranolol (INDERAL) 20 MG tablet Take 20 mg by mouth.    . senna-docusate (SENOKOT-S) 8.6-50 MG tablet Take 1 tablet by mouth at bedtime as needed for mild constipation. 30 tablet 0  . sertraline (ZOLOFT) 100 MG tablet Take 100 mg by mouth daily.     . verapamil (CALAN-SR) 120 MG CR tablet Take 60 mg by mouth at bedtime.      No facility-administered medications prior to visit.     PAST MEDICAL HISTORY: Past Medical History:    Diagnosis Date  . Arthritis   . Depression   . Diabetes mellitus type 2 in obese (HCC)   . Hypertension   . Migraines   . Sleep apnea   . Stroke [facility])     PAST SURGICAL HISTORY: Past Surgical History:  Procedure Laterality Date  . CHOLECYSTECTOMY    . KNEE ARTHROSCOPY    . LOOP RECORDER INSERTION N/A 06/24/2017   Procedure: LOOP RECORDER INSERTION;  Surgeon: Hillis Range, MD;  Location: [facility] INVASIVE CV LAB;  Service: Cardiovascular;  Laterality: N/A;  . ROTATOR CUFF REPAIR    . TEE WITHOUT CARDIOVERSION N/A 06/24/2017   Procedure: TRANSESOPHAGEAL ECHOCARDIOGRAM (TEE) WITH LOOP;  Surgeon: Elease Hashimoto Deloris Ping, MD;  Location: [facility] ENDOSCOPY;  Service: Cardiovascular;  Laterality: N/A;    FAMILY HISTORY: Family History  Problem Relation Age of Onset  . Other Mother        reports in good health  . Other Father  was estranged from family, unknown medical history    SOCIAL HISTORY: Social History   Socioeconomic History  . Marital status: Married    Spouse name: Not on file  . Number of children: Not on file  . Years of education: Not on file  . Highest education level: Not on file  Social Needs  . Financial resource strain: Not on file  . Food insecurity - worry: Not on file  . Food insecurity - inability: Not on file  . Transportation needs - medical: Not on file  . Transportation needs - non-medical: Not on file  Occupational History  . Not on file  Tobacco Use  . Smoking status: Light Tobacco Smoker    Types: Cigars  . Smokeless tobacco: Never Used  Substance and Sexual Activity  . Alcohol use: No    Alcohol/week: 0.0 oz  . Drug use: No  . Sexual activity: Not Currently  Other Topics Concern  . Not on file  Social History Narrative  . Not on file     PHYSICAL EXAM  There were no vitals filed for this visit. There is no height or weight on file to calculate BMI.  Generalized: Well developed, in no acute distress  Head: normocephalic and atraumatic,.  Oropharynx benign  Neck: Supple, no carotid bruits  Cardiac: Regular rate rhythm, no murmur  Musculoskeletal: No deformity   Neurological examination   Mentation: Alert oriented to time, place, history taking. Attention span and concentration appropriate. Recent and remote memory intact.  Follows all commands speech and language fluent.   Cranial nerve II-XII: Fundoscopic exam reveals sharp disc margins.Pupils were equal round reactive to light extraocular movements were full, visual field were full on confrontational test. Facial sensation and strength were normal. hearing was intact to finger rubbing bilaterally. Uvula tongue midline. head turning and shoulder shrug were normal and symmetric.Tongue protrusion into cheek strength was normal. Motor: normal bulk and tone, full strength in the BUE, BLE, fine finger movements normal, no pronator drift. No focal weakness Sensory: normal and symmetric to light touch, pinprick, and  Vibration, proprioception  Coordination: finger-nose-finger, heel-to-shin bilaterally, no dysmetria Reflexes: Brachioradialis 2/2, biceps 2/2, triceps 2/2, patellar 2/2, Achilles 2/2, plantar responses were flexor bilaterally. Gait and Station: Rising up from seated position without assistance, normal stance,  moderate stride, good arm swing, smooth turning, able to perform tiptoe, and heel walking without difficulty. Tandem gait is steady  DIAGNOSTIC DATA (LABS, IMAGING, TESTING) - I reviewed patient records, labs, notes, testing and imaging myself where available.  Lab Results  Component Value Date   WBC 7.1 07/10/2017   HGB 15.3 07/10/2017   HCT 45.0 07/10/2017   MCV 86.3 07/10/2017   PLT 134 (L) 07/10/2017      Component Value Date/Time   NA 139 07/10/2017 1255   NA 136 10/09/2015 0000   NA 137 10/09/2015 0000   K 3.6 07/10/2017 1255   CL 101 07/10/2017 1255   CO2 25 07/10/2017 1246   GLUCOSE 163 (H) 07/10/2017 1255   BUN 21 (H) 07/10/2017 1255   BUN 10  10/09/2015 0000   BUN 11 10/09/2015 0000   CREATININE 1.00 07/10/2017 1255   CALCIUM 8.6 (L) 07/10/2017 1246   PROT 5.9 (L) 07/10/2017 1246   PROT 6.1 10/09/2015 0000   PROT 6.0 10/09/2015 0000   ALBUMIN 3.5 07/10/2017 1246   ALBUMIN 4.0 10/09/2015 0000   ALBUMIN 4.2 10/09/2015 0000   AST 20 07/10/2017 1246   ALT 22 07/10/2017 1246  ALKPHOS 32 (L) 07/10/2017 1246   BILITOT 0.9 07/10/2017 1246   BILITOT 0.5 10/09/2015 0000   BILITOT 0.4 10/09/2015 0000   GFRNONAA >60 07/10/2017 1246   GFRAA >60 07/10/2017 1246   Lab Results  Component Value Date   CHOL 168 06/22/2017   HDL 30 (L) 06/22/2017   LDLCALC 112 (H) 06/22/2017   TRIG 130 06/22/2017   CHOLHDL 5.6 06/22/2017   Lab Results  Component Value Date   HGBA1C 6.1 (H) 06/22/2017   Lab Results  Component Value Date   VITAMINB12 265 07/11/2017   Lab Results  Component Value Date   TSH 0.643 07/11/2017    ***  ASSESSMENT AND PLAN  67 y.o. year old male  has a past medical history of Arthritis, Depression, Diabetes mellitus type 2 in obese (HCC), Hypertension, Migraines, Sleep apnea, and Stroke (HCC). here with ***    Cline Crock, [facility], APRN  [facility] [address] [place], [place] [address] [phone]

## 2017-11-24 ENCOUNTER — Telehealth: Payer: Self-pay | Admitting: *Deleted

## 2017-11-24 ENCOUNTER — Ambulatory Visit: Payer: Self-pay | Admitting: Nurse Practitioner

## 2017-11-24 NOTE — Telephone Encounter (Signed)
Patient was no show for follow up with NP today.  

## 2017-11-24 NOTE — Progress Notes (Signed)
[facility] Summary Report / Loop Recorder 

## 2017-11-25 ENCOUNTER — Encounter: Payer: Self-pay | Admitting: Nurse Practitioner

## 2017-11-25 LAB — CUP PACEART REMOTE DEVICE CHECK
Date Time Interrogation Session: 20190118210827
MDC IDC PG IMPLANT DT: 20180822

## 2017-12-11 ENCOUNTER — Encounter: Payer: Self-pay | Admitting: Nurse Practitioner

## 2017-12-15 ENCOUNTER — Telehealth: Payer: Self-pay | Admitting: Neurology

## 2017-12-15 NOTE — Telephone Encounter (Signed)
Received a notification from [employer] needing follow up office notes on the patient for his new cpap. The patient should have been seen by 12/09/2017 to meet the 90 days requirement of his CPAP. [facility] states that if we can work him in asap then everything will still be ok otherwise the patient will have to start the whole process over with getting established with the CPAP I have called the pt to make him aware of this and attempt to get him rescheduled. No answer. If the patient calls back he can be seen with NP or we can try and work him in with Dr Vickey Hugerohmeier since this is a Community education officerinsurance requirement. LVM for the patient to call back and schedule

## 2017-12-18 ENCOUNTER — Encounter: Payer: Self-pay | Admitting: Adult Health

## 2017-12-18 ENCOUNTER — Ambulatory Visit (INDEPENDENT_AMBULATORY_CARE_PROVIDER_SITE_OTHER): Payer: Medicare Other | Admitting: Adult Health

## 2017-12-18 ENCOUNTER — Encounter: Payer: Self-pay | Admitting: *Deleted

## 2017-12-18 ENCOUNTER — Telehealth: Payer: Self-pay | Admitting: Neurology

## 2017-12-18 VITALS — BP 168/88 | HR 46 | Ht 65.0 in | Wt 233.4 lb

## 2017-12-18 DIAGNOSIS — G4733 Obstructive sleep apnea (adult) (pediatric): Secondary | ICD-10-CM

## 2017-12-18 DIAGNOSIS — Z9989 Dependence on other enabling machines and devices: Secondary | ICD-10-CM | POA: Diagnosis not present

## 2017-12-18 DIAGNOSIS — I639 Cerebral infarction, unspecified: Secondary | ICD-10-CM | POA: Diagnosis not present

## 2017-12-18 NOTE — Patient Instructions (Signed)
Your Plan:  Continue using CPAP nightly Increase pressure to 8cmh20  Thank you for coming to see us at [facility]. I hope we have been able to provide you high quality care today.  You may receive a patient satisfaction survey over the next few weeks. We would appreciate your feedback and comments so that we may continue to improve ourselves and the health of our patients.

## 2017-12-18 NOTE — Progress Notes (Signed)
PATIENT: Raymond [name] DOB: 1951/07/30  REASON FOR VISIT: follow up Raymond FROM: patient  Raymond OF PRESENT ILLNESS: Today 12/18/17 Raymond [name] is a 67 year old male with a Raymond of obstructive sleep apnea on CPAP.  He returns today for an evaluation.  His CPAP download indicates that he use his machine 28 out of 30 days for compliance of 93%.  He uses machine greater than 4 hours 28 out of 30 days for compliance of 93%.  On average he uses his machine 9 hours and 31 minutes.  His residual AHI is 0.9 on 7 cm of water with EPR 1.  He does not have a significant leak.  He reports that the machine is working well for him.  He does state that he has noticed when he turns over on his side he will start snoring again.  He also notes that the mask is currently irritating the sides of his face.  He returns today for evaluation.  Raymond [name] is a 67 y.o. male , seen here as in a referral  from Raymond [name] at [facility], who has seen this patient for stroke twice.  Today's 07/31/2017, I have the pleasure of meeting Raymond [name]. Raymond [name] has seen Raymond [name] twice on the stroke service at [facility], he was  admitted on 06/22/2017 and discharged on the 21st. - This was followed by a second admission between the 14th and 15th of September of this year, 12 days ago. 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 There were also small infarctions throughout the MCA territory. In addition remote watershed territory infarctions are noted. From 2017. Proximal stenosis of was suspected. CTA followed. 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 There is generalized arthrosclerotic irregularity of vessels in medium size  range. P2 segment is showing an advanced stenosis greater on the right. The patient is a lower extremity venous Doppler which was normal, he had a 2-D echocardiogram which was normal, a TEE was pending at the time. Vital signs on August 21 documented hypertension but normal oxygenation. Raymond [name]  referred the patient for a new sleep evaluation. He was aware that the patient has a Raymond of obstructive sleep apnea and has been on CPAP for 6 years.   In addition the patient carries a diagnosis of migraine, poorly controlled hypertension, diabetes mellitus, hearing loss, repeated ear infection, cholecystectomy, pneumonia. He still felt that the stroke may have possibly been cardioembolic differential diagnosis included atrial fibrillation, PFO with DVT or endocarditis. The and health have drastically changed since July 31, when he first developed an ear infection, followed by hearing loss, one week later he had a cholecystectomy, in early August, and pneumonia,  in 7 days developed stroke symptoms!   A TEE was finally performed and returned normal, the patient was discharged on aspirin 300 2520 g daily and Plavix 75 mg and continued to use the APTT for the next 3 months. The patient is currently a loop recorder implant.  I would like to "his previous polysomnography from 11/29/2010, the patient had moderate to loud snoring moderate obstructive sleep apnea with an AHI of 21 per hour of sleep. Non-positional oxygen nadir was 84%. Interpreted by  Raymond [name]. Also Axis II the patient's current CPAP download he has been highly compliant CPAP user was 93% compliance average user time is 9 hours and 6 minutes CPAP is set at 10 cm water pressure with 2 cm EPR full-time, residual AHI is excellent at only 0.4 he does have occasional high air leaks on nasal mask .  REVIEW OF SYSTEMS: Out of a complete 14 system review of symptoms, the patient complains only of the following symptoms, and all other reviewed  systems are negative. See HPI  ALLERGIES: Allergies  Allergen Reactions  . Flomax [Tamsulosin Hcl] Other (See Comments)    HYPOtension and syncope    HOME MEDICATIONS: Outpatient Medications Prior to Visit  Medication Sig Dispense Refill  . acetaminophen (TYLENOL) 500 MG tablet Take 2 tablets (1,000 mg total) by mouth every 6 (six) hours as needed. (Patient taking differently: Take 1,000 mg by mouth every 6 (six) hours as needed for mild pain. ) 30 tablet 0  . aspirin (BAYER ASPIRIN) 325 MG tablet Take 1 tablet (325 mg total) by mouth daily. 360 tablet 0  . atorvastatin (LIPITOR) 20 MG tablet Take 1 tablet (20 mg total) by mouth daily at 6 PM. 30 tablet 0  . hydrOXYzine (ATARAX/VISTARIL) 50 MG tablet Take 50 mg by mouth every 6 (six) hours as needed.     Marland Kitchen. levocetirizine (XYZAL) 5 MG tablet as needed.     Marland Kitchen. losartan-hydrochlorothiazide (HYZAAR) 100-25 MG tablet Take 0.5 tablets by mouth daily.    . metFORMIN (GLUCOPHAGE) 500 MG tablet Take 1 tablet (500 mg total) by mouth at bedtime.    . propranolol (INDERAL) 20 MG tablet Take 20 mg by mouth.    . sertraline (ZOLOFT) 100 MG tablet Take 100 mg by mouth daily.     . verapamil (CALAN-SR) 120 MG CR tablet Take 60 mg by mouth at bedtime.     . clopidogrel (PLAVIX) 75 MG tablet Take 1 tablet (75 mg total) by mouth daily. (Patient not taking: Reported on 12/18/2017) 30 tablet 0  . senna-docusate (SENOKOT-S) 8.6-50 MG tablet Take 1 tablet by mouth at bedtime as needed for mild constipation. (Patient not taking: Reported on 12/18/2017) 30 tablet 0   No facility-administered medications prior to visit.     PAST MEDICAL Raymond: Past Medical Raymond:  Diagnosis Date  . Arthritis   . Depression   . Diabetes mellitus type 2 in obese (HCC)   . Hypertension   . Migraines   . Sleep apnea   . Stroke [facility](HCC)     PAST SURGICAL Raymond: Past Surgical Raymond:  Procedure Laterality Date  . CHOLECYSTECTOMY    . KNEE ARTHROSCOPY    . LOOP  RECORDER INSERTION N/A 06/24/2017   Procedure: LOOP RECORDER INSERTION;  Surgeon: Hillis RangeAllred, James, MD;  Location: [facility] INVASIVE CV LAB;  Service: Cardiovascular;  Laterality: N/A;  . ROTATOR CUFF REPAIR    . TEE WITHOUT CARDIOVERSION N/A 06/24/2017   Procedure: TRANSESOPHAGEAL ECHOCARDIOGRAM (TEE) WITH LOOP;  Surgeon: Elease HashimotoNahser, Deloris PingPhilip J, MD;  [facility];  Service: Cardiovascular;  Laterality: N/A;    FAMILY Raymond: Family Raymond  Problem Relation Age of Onset  . Other Mother        reports in good health  . Other Father        was estranged from family, unknown medical Raymond    SOCIAL Raymond: Social Raymond   Socioeconomic Raymond  . Marital status: Married    Spouse name: Not on file  .  Number of children: Not on file  . Years of education: Not on file  . Highest education level: Not on file  Social Needs  . Financial resource strain: Not on file  . Food insecurity - worry: Not on file  . Food insecurity - inability: Not on file  . Transportation needs - medical: Not on file  . Transportation needs - non-medical: Not on file  Occupational Raymond  . Not on file  Tobacco Use  . Smoking status: Light Tobacco Smoker    Types: Cigars  . Smokeless tobacco: Never Used  Substance and Sexual Activity  . Alcohol use: No    Alcohol/week: 0.0 oz  . Drug use: No  . Sexual activity: Not Currently  Other Topics Concern  . Not on file  Social Raymond Narrative  . Not on file      PHYSICAL EXAM  Vitals:   12/18/17 1416  BP: (!) 168/88  Pulse: (!) 46  Weight: 233 lb 6.4 oz (105.9 kg)  Height: 5\' 5"  (1.651 m)   Body mass index is 38.84 kg/m.  Generalized: Well developed, in no acute distress  Chest: Lungs clear to auscultation  Neurological examination  Mentation: Alert oriented to time, place, Raymond taking. Follows all commands speech and language fluent Cranial nerve II-XII: Pupils were equal round reactive to light. Extraocular movements were full,  visual field were full on confrontational test. Facial sensation and strength were normal. Uvula tongue midline. Head turning and shoulder shrug  were normal and symmetric. Motor: The motor testing reveals 5 over 5 strength of all 4 extremities. Good symmetric motor tone is noted throughout.  Sensory: Sensory testing is intact to soft touch on all 4 extremities. No evidence of extinction is noted.  Coordination: Cerebellar testing reveals good finger-nose-finger and heel-to-shin bilaterally.  Gait and station: Gait is normal.  Reflexes: Deep tendon reflexes are symmetric and normal bilaterally.   DIAGNOSTIC DATA (LABS, IMAGING, TESTING) - I reviewed patient records, labs, notes, testing and imaging myself where available.  Lab Results  Component Value Date   WBC 7.1 07/10/2017   HGB 15.3 07/10/2017   HCT 45.0 07/10/2017   MCV 86.3 07/10/2017   PLT 134 (L) 07/10/2017      Component Value Date/Time   NA 139 07/10/2017 1255   NA 136 10/09/2015 0000   NA 137 10/09/2015 0000   K 3.6 07/10/2017 1255   CL 101 07/10/2017 1255   CO2 25 07/10/2017 1246   GLUCOSE 163 (H) 07/10/2017 1255   BUN 21 (H) 07/10/2017 1255   BUN 10 10/09/2015 0000   BUN 11 10/09/2015 0000   CREATININE 1.00 07/10/2017 1255   CALCIUM 8.6 (L) 07/10/2017 1246   PROT 5.9 (L) 07/10/2017 1246   PROT 6.1 10/09/2015 0000   PROT 6.0 10/09/2015 0000   ALBUMIN 3.5 07/10/2017 1246   ALBUMIN 4.0 10/09/2015 0000   ALBUMIN 4.2 10/09/2015 0000   AST 20 07/10/2017 1246   ALT 22 07/10/2017 1246   ALKPHOS 32 (L) 07/10/2017 1246   BILITOT 0.9 07/10/2017 1246   BILITOT 0.5 10/09/2015 0000   BILITOT 0.4 10/09/2015 0000   GFRNONAA >60 07/10/2017 1246   GFRAA >60 07/10/2017 1246   Lab Results  Component Value Date   CHOL 168 06/22/2017   HDL 30 (L) 06/22/2017   LDLCALC 112 (H) 06/22/2017   TRIG 130 06/22/2017   CHOLHDL 5.6 06/22/2017   Lab Results  Component Value Date   HGBA1C 6.1 (H) 06/22/2017   Lab  Results    Component Value Date   VITAMINB12 265 07/11/2017   Lab Results  Component Value Date   TSH 0.643 07/11/2017      ASSESSMENT AND PLAN 67 y.o. year old male  has a past medical Raymond of Arthritis, Depression, Diabetes mellitus type 2 in obese (HCC), Hypertension, Migraines, Sleep apnea, and Stroke (HCC). here with:  1. OSA on CPAP  The patient CPAP download shows excellent compliance and good treatment of his apnea.  We will increase his pressure to 8 cm of water to see if this is beneficial for his snoring.  Also advised that he can discuss with his DME company regarding the mask irritating his face.  They may have mask liners that would eliminate this.  Patient voiced understanding.  He will follow-up in 6 months or sooner if needed.  I spent 15 minutes with the patient. 50% of this time was spent Reviewing CPAP download  Raymond Penny, MSN, NP-C 12/18/2017, 2:37 PM [facility] [address], [address] [place], [place] [address] [phone]

## 2017-12-18 NOTE — Telephone Encounter (Signed)
Called the patient and it went to his wife. The wife states that they had not received any calls. They would like to make sure that we add the home number to our call list because his cell phone was not getting our messages. I have informed her I would take care of this. There was a opening today and I placed the patient on megan millikan schedule to get in and be seen for his compliance. He is already past due for his compliance visit and I informed the wife that if he didn't come in then he would have to start the whole CPAP process over. pts wife appreciated the call and they will get him in today for apt at 2:30 with check in at 2.

## 2017-12-18 NOTE — Progress Notes (Signed)
Fax confirmation received for [employer] cpap supplies  [phone].

## 2017-12-18 NOTE — Progress Notes (Signed)
I agree with the assessment and plan as directed by NP .The patient is known to me .   Donavan Kerlin, MD  

## 2017-12-22 ENCOUNTER — Ambulatory Visit (INDEPENDENT_AMBULATORY_CARE_PROVIDER_SITE_OTHER): Payer: Medicare Other | Admitting: *Deleted

## 2017-12-22 DIAGNOSIS — I639 Cerebral infarction, unspecified: Secondary | ICD-10-CM | POA: Diagnosis not present

## 2017-12-23 NOTE — Progress Notes (Signed)
[facility] Summary Report / Loop Recorder 

## 2018-01-08 IMAGING — CT CT ANGIO HEAD
1 of 8 series · 6 of 33 positions shown · IV contrast (OMNI 350)
Comparison: Prior CT from earlier the same day as well as recent
CTA from 06/22/2017.

CLINICAL DATA: Initial evaluation for acute onset right arm
weakness.

EXAM:
CT ANGIOGRAPHY HEAD AND NECK
TECHNIQUE: Multidetector CT imaging of the head and neck was performed using
the standard protocol during bolus administration of intravenous
contrast. Multiplanar CT image reconstructions and MIPs were
obtained to evaluate the vascular anatomy. Carotid stenosis
measurements (when applicable) are obtained utilizing NASCET
criteria, using the distal internal carotid diameter as the
denominator.
CONTRAST:  50 cc of Isovue 370.

[Series 7: cta neck axial · axial · 0.39mm/px · z∈[-286,-31]mm · 6 of 357 slices shown]
[im 51/357  soft-tissue]
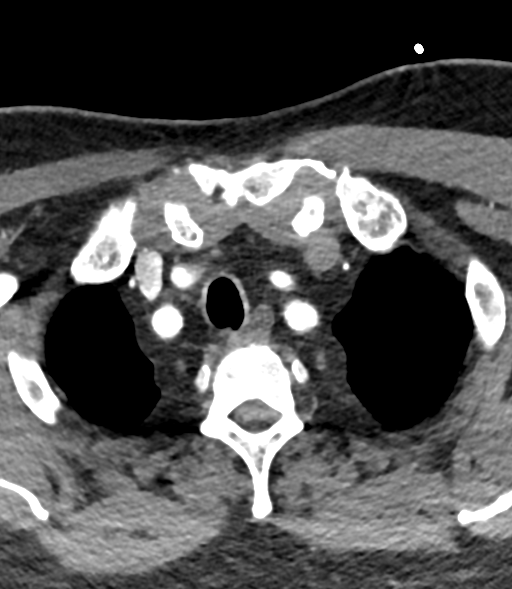
[im 102/357  bone]
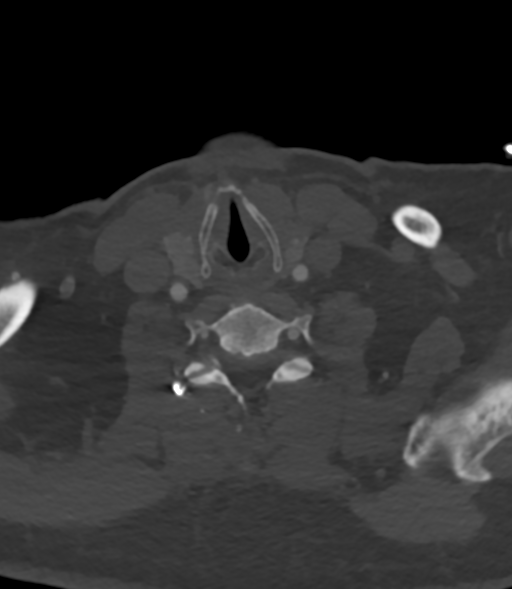
[im 153/357  soft-tissue]
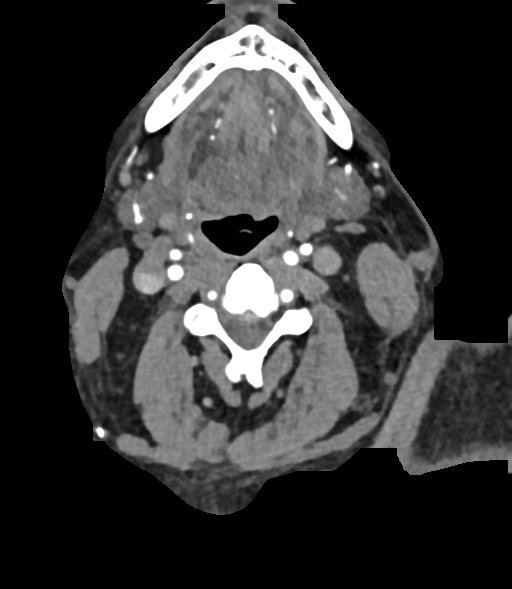
[im 204/357  bone]
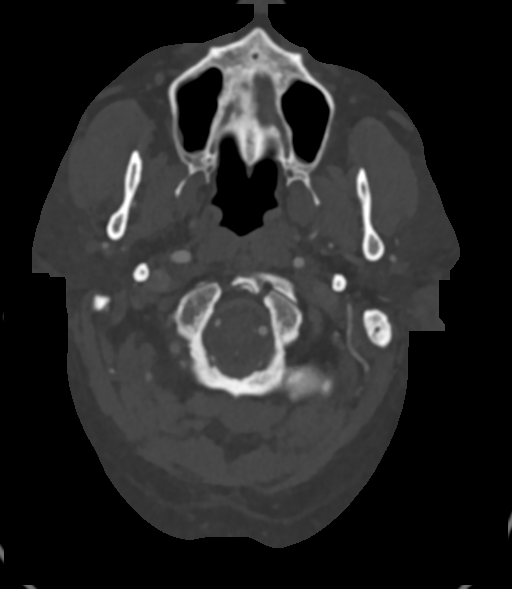
[im 255/357  soft-tissue]
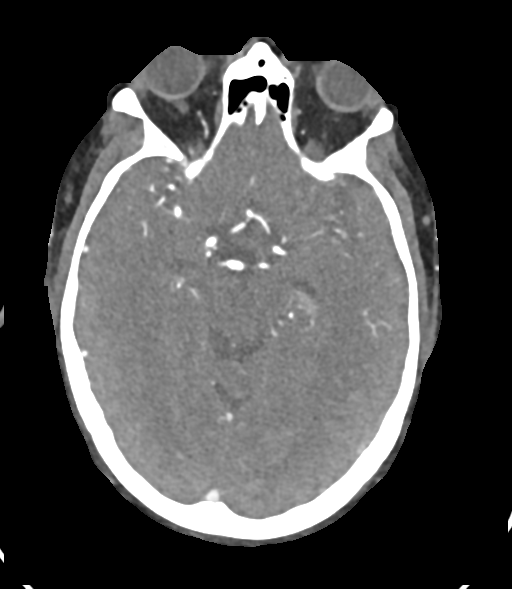
[im 306/357  bone]
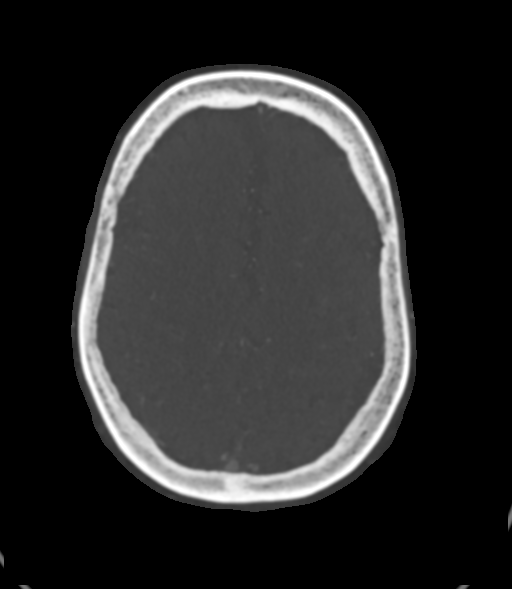

[6 of 33 positions shown; findings below may reference images not displayed]

FINDINGS: CTA NECK FINDINGS

Aortic arch: Aortic arch of normal caliber with normal branch
pattern. No flow-limiting stenosis about the origin of the great
vessels. Visualized subclavian artery use widely patent.

Right carotid system: Right carotid artery system widely patent
without stenosis, dissection, or occlusion. No significant
atheromatous narrowing about the right carotid bifurcation.

Left carotid system: Left carotid artery system widely patent
without stenosis, dissection, or occlusion. No significant
atheromatous narrowing about the left carotid bifurcation.

Vertebral arteries: Both of the vertebral arteries arise from the
subclavian arteries. Vertebral artery is widely patent within the
neck without stenosis, dissection, or occlusion. Left vertebral
artery dominant.

Skeleton: No acute osseus abnormality. No worrisome lytic or blastic
osseous lesions. Stable mild multilevel degenerative spondylolysis,
greatest at C5-6.

Other neck: No acute soft tissue abnormality within the neck.
Salivary glands normal. No adenopathy. Thyroid normal.

Upper chest: Upper chest within normal limits. Calcified granuloma
noted at the posterior left upper lobe.

Review of the MIP images confirms the above findings

CTA HEAD FINDINGS

Anterior circulation: Petrous, cavernous, and supraclinoid segments
of the internal carotid artery's patent with antegrade flow.
Hypoplastic but patent left A1 segment. Patent right A1. Normal
small anterior communicating artery. Atheromatous irregularity
involving the anterior cerebral arteries without high-grade
stenosis.

Short-segment severe stenosis/ near occlusion of the proximal left
M1 segment again noted, stable in appearance from previous. Markedly
attenuated flow with decreased perfusion distally, also stable.

Mild atheromatous regularity within the right M1 segment without
stenosis. Right MCA bifurcation normal. Atheromatous irregularity
within the distal right MCA branches which are well opacified.

Posterior circulation: Vertebral artery's patent to the
vertebrobasilar junction without stenosis. Left vertebral artery
dominant. Patent left PICA. Basilar artery widely patent to its
distal aspect. Superior cerebral arteries patent bilaterally. Both
of the posterior cerebral arteries primarily supplied via the
basilar. Extensive multifocal atheromatous regularity involving the
bilateral PCAs again noted, slightly worse on the right. Appearance
is similar to previous.

Venous sinuses: Patent.

Anatomic variants: None.

Delayed phase: No pathologic enhancement.

Review of the MIP images confirms the above findings
IMPRESSION: 1. Stable CTA of the head and neck as compared to recent exam from
06/22/2017.
[DATE]. Occlusion and/or pre occlusive stenosis involving the proximal
left M1 segment with decreased perfusion distally, stable from
previous.
3. Prominent atherosclerotic irregularity involving the median sized
vessels, most prominent within the bilateral PCAs, with resultant
moderate to severe stenoses.
4. Essentially normal CTA of the neck with minimal atherosclerotic
change for age.

## 2018-01-19 LAB — CUP PACEART REMOTE DEVICE CHECK
Implantable Pulse Generator Implant Date: 20180822
MDC IDC SESS DTM: 20190218205758

## 2018-01-26 ENCOUNTER — Ambulatory Visit (INDEPENDENT_AMBULATORY_CARE_PROVIDER_SITE_OTHER): Payer: Medicare Other | Admitting: *Deleted

## 2018-01-26 ENCOUNTER — Ambulatory Visit: Payer: Medicare Other | Admitting: Nurse Practitioner

## 2018-01-26 DIAGNOSIS — I639 Cerebral infarction, unspecified: Secondary | ICD-10-CM

## 2018-01-26 NOTE — Progress Notes (Signed)
[facility] Summary Report / Loop Recorder 

## 2018-02-05 NOTE — Progress Notes (Addendum)
[facility] [address] [place]. [place] [address] [phone]       OFFICE FOLLOW-UP NOTE  Mr. KAIRE STARY Date of Birth:  1951-08-29 Medical Record Number:  [id number]   HPI: Mr Lavallee is a 89 year Caucasian male seen today for first office follow-up visit following hospital admission for stroke in August 2018. He is accompanied by his wife. History is obtained from them as well as review of electronic medical records. I have personally reviewed imaging films.JAVONTAY VANDAM is an 67 y.o. male who has had multiple medical problems recently with recurrent admissions, including ear infection followed by gall bladder surgery and then pneumonia, who developed confusion and difficulty speaking on 06/21/17. He was initially seen at [facility] and was transferred to [facility] for further care. He has been more sleepy and confused. Speech deficit is described as having "a hard time getting my words out". Due to his recent ear infection, he has persisting decreased auditory acuity in his right ear. He has had "altered mental status" for "a few days" with no clearly defined time of onset hence tPA was not considered. MRI brain reviewed showed left middle cerebral artery branch infarct. MRA of the brain showed occlusion versus preocclusive stenosis of the left M1 and the distal portion with underfilled branches downstream.LE venous Doppler was negative for DVT. 2-D echo showed normal ejection fraction. Transesophageal echocardiogram showed no cord expansile embolism. Patient had loop recorder inserted. He was started on aspirin and Plavix. He was discharged but returned 2 weeks later with tremulousness of his right hand as well as some increase expressive aphasia and right hand weakness. Repeat MRI scan of the brain was obtained which showed no acute finding. LDL cholesterol was elevated at 112 mg percent and hemoglobin A1c was 6.1.  History 07/30/17 Dr. Pearlean Brownie: Patient states his speech has  improved since discharge is currently doing outpatient physical and occupation therapy. Is to lessen diminished fine motor skills and weakness in his right hand. He however noticed some intermittent tremor in his right hand but this also seems to be improving. He has significant hearing loss in the right ear but does report prior history of recurrent ear infections and tinnitus. He has seen an ENT physician and plans to try a hearing aid. He is also not happy with his CPAP device and has an appointment to see Dr. Vickey Huger tomorrow to discuss CPAP settings. He is tolerating aspirin and Plavix without bruising or bleeding. Is also tolerating Lipitor without muscle aches and pains. He states is eating healthy  but has not lost weight. He plans to be more active.  History 02/05/18: Patient returns today for six-month follow-up visit and is accompanied by his wife.  Overall, he is doing well but continues to complain of right hand action and resting tremor intermittently.  Wife states that she noticed this more at night when he is more fatigued.  This does interfere with ADLs and IADLs as he is unable to write when the tremors get bad enough, unable to hold a cup, and unable to use that hand to hold anything.  At today's visit, very fine action tremor observed but patient states that this is really good for him right now.  He is right-hand dominant.  Does have complaints of subjective right-sided weakness but this has greatly improved since his stroke.  Continues to take aspirin and tolerating that without bleeding or bruising.  Continues to take Lipitor without myalgias.  Recently had lipid  level checked and per patient, cholesterol levels satisfactory.  Most recent A1c 4.9 and continues to take metformin.  Blood pressure today's visit satisfactory at 129/74.  Has recently obtained bilateral hearing aids and this is helped with his hearing loss.  Does complain of age-related memory loss but overall this has improved  since his stroke.  Continues to wear CPAP for OSA.  Does have complaints that he continues to snore and was told by Dr. Vickey Huger to inform more if this occurs so pressure can be increased.  Will inform her nurse.  Loop recorder has not shown atrial fibrillation thus far.  2 weeks ago had right hip injections done for complaints of hip pain which did provide patient with some relief.  Denies new or worsening stroke/TIA symptoms.   ROS:   14 system review of systems is positive fo tremors and weakness and all other systems negative  PMH:  Past Medical History:  Diagnosis Date  . Arthritis   . Depression   . Diabetes mellitus type 2 in obese (HCC)   . Hypertension   . Migraines   . Sleep apnea   . Stroke [facility])     Social History:  Social History   Socioeconomic History  . Marital status: Married    Spouse name: Not on file  . Number of children: Not on file  . Years of education: Not on file  . Highest education level: Not on file  Occupational History  . Not on file  Social Needs  . Financial resource strain: Not on file  . Food insecurity:    Worry: Not on file    Inability: Not on file  . Transportation needs:    Medical: Not on file    Non-medical: Not on file  Tobacco Use  . Smoking status: Light Tobacco Smoker    Types: Cigars  . Smokeless tobacco: Never Used  . Tobacco comment: chew on cigars its not lit  Substance and Sexual Activity  . Alcohol use: No    Alcohol/week: 0.0 oz  . Drug use: No  . Sexual activity: Not Currently  Lifestyle  . Physical activity:    Days per week: Not on file    Minutes per session: Not on file  . Stress: Not on file  Relationships  . Social connections:    Talks on phone: Not on file    Gets together: Not on file    Attends religious service: Not on file    Active member of club or organization: Not on file    Attends meetings of clubs or organizations: Not on file    Relationship status: Not on file  . Intimate partner  violence:    Fear of current or ex partner: Not on file    Emotionally abused: Not on file    Physically abused: Not on file    Forced sexual activity: Not on file  Other Topics Concern  . Not on file  Social History Narrative  . Not on file    Medications:   Current Outpatient Medications on File Prior to Visit  Medication Sig Dispense Refill  . acetaminophen (TYLENOL) 500 MG tablet Take 2 tablets (1,000 mg total) by mouth every 6 (six) hours as needed. (Patient taking differently: Take 1,000 mg by mouth every 6 (six) hours as needed for mild pain. ) 30 tablet 0  . aspirin (BAYER ASPIRIN) 325 MG tablet Take 1 tablet (325 mg total) by mouth daily. 360 tablet 0  . atorvastatin (  LIPITOR) 20 MG tablet Take 1 tablet (20 mg total) by mouth daily at 6 PM. 30 tablet 0  . hydrOXYzine (ATARAX/VISTARIL) 50 MG tablet Take 50 mg by mouth every 6 (six) hours as needed.     Marland Kitchen. levocetirizine (XYZAL) 5 MG tablet as needed.     Marland Kitchen. losartan-hydrochlorothiazide (HYZAAR) 100-25 MG tablet Take 0.5 tablets by mouth daily.    . metFORMIN (GLUCOPHAGE) 500 MG tablet Take 1 tablet (500 mg total) by mouth at bedtime.    . propranolol (INDERAL) 20 MG tablet Take 20 mg by mouth.    . sertraline (ZOLOFT) 100 MG tablet Take 100 mg by mouth daily.     . verapamil (CALAN-SR) 120 MG CR tablet Take 60 mg by mouth at bedtime.      No current facility-administered medications on file prior to visit.     Allergies:   Allergies  Allergen Reactions  . Flomax [Tamsulosin Hcl] Other (See Comments)    HYPOtension and syncope    Physical Exam General: Mildly obese middle-age Caucasian male seated, in no evident distress Head: head normocephalic and atraumatic.  Neck: supple with no carotid or supraclavicular bruits Cardiovascular: regular rate and rhythm, no murmurs Musculoskeletal: no deformity Skin:  no rash/petichiae Vascular:  Normal pulses all extremities Vitals:   02/06/18 0807  BP: 129/74  Pulse: (!) 52    Neurologic Exam Mental Status: Awake and fully alert. Oriented to place and time. Recent and remote memory intact. Attention span, concentration and fund of knowledge appropriate. Mood and affect appropriate.  Cranial Nerves: Fundoscopic exam reveals sharp disc margins. Pupils equal, briskly reactive to light. Extraocular movements full without nystagmus. Visual fields full to confrontation. Hearing diminished in the right ear with conductive hearing loss. Facial sensation intact. Face, tongue, palate moves normally and symmetrically.  Motor: Normal bulk and tone. Normal strength in all tested extremity muscles.Diminished fine finger movements in the right hand. Fine action tremor noted; No cogwheel rigidity or bradykinesia. Sensory.: intact to touch ,pinprick .position and vibratory sensation. Mild increased sensation on right hand. Coordination: Rapid alternating movements normal in all extremities. Finger-to-nose and heel-to-shin performed accurately bilaterally. Gait and Station: Arises from chair without difficulty. Stance is normal. Gait demonstrates normal stride length and balance . Able to heel, toe and tandem walk with slight difficulty.  Reflexes: 1+ and symmetric. Toes downgoing.    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 Vascular risk factors of diabetes, hypertension, hyperlipidemia, obesity and sleep apnea. Patient returns today for follow up visit and overall is doing well but does continue to complain of mild right hand tremor.  PLAN:  -Continue aspirin 325 mg daily  and lipitor  for secondary stroke prevention -f/u PCP for HTN, HLD, DM and OSA -encouraged to continue wearing CPAP for OSA -continue to monitor loop recorder -Recommended wrist weight-on right hand for tremor.  Consider possible medication management if this does not improve as patient does complain it interferes with his ADLs and  IADLs. Baird Lyons-Casey, RN (Dr. Marko Staiohmeiers nurse) will notify Dr. Vickey Hugerohmeier of increased snoring and will notify patient of pressure change if needed -Maintain strict control of hypertension with blood pressure goal below 130/90, diabetes with hemoglobin A1c goal below 6.5% and cholesterol with LDL cholesterol (bad cholesterol) goal below 70 mg/dL. I also advised the patient to eat a healthy diet with plenty of whole grains, cereals, fruits and vegetables, exercise regularly and maintain ideal body weight.  Followup in the future with me in 6 months  Greater than 50% time during this 25 minute consultation visit was spent on counseling and coordination of care about HLD, HTN, DM, OSA, and  obesity (risk factors), discussion about risk benefit of anticoagulation and answering questions.    George Hugh, AGNP-BC  [facility] [address] [place], [place] [address]  Phone [phone] Fax [phone]

## 2018-02-06 ENCOUNTER — Other Ambulatory Visit: Payer: Self-pay | Admitting: Neurology

## 2018-02-06 ENCOUNTER — Ambulatory Visit (INDEPENDENT_AMBULATORY_CARE_PROVIDER_SITE_OTHER): Payer: Medicare Other | Admitting: Adult Health

## 2018-02-06 ENCOUNTER — Encounter: Payer: Self-pay | Admitting: Adult Health

## 2018-02-06 ENCOUNTER — Telehealth: Payer: Self-pay | Admitting: Neurology

## 2018-02-06 ENCOUNTER — Ambulatory Visit: Payer: Medicare Other | Admitting: Nurse Practitioner

## 2018-02-06 VITALS — BP 129/74 | HR 52 | Ht 65.0 in | Wt 231.4 lb

## 2018-02-06 DIAGNOSIS — G4733 Obstructive sleep apnea (adult) (pediatric): Secondary | ICD-10-CM

## 2018-02-06 DIAGNOSIS — E785 Hyperlipidemia, unspecified: Secondary | ICD-10-CM | POA: Diagnosis not present

## 2018-02-06 DIAGNOSIS — Z9989 Dependence on other enabling machines and devices: Secondary | ICD-10-CM

## 2018-02-06 DIAGNOSIS — E669 Obesity, unspecified: Secondary | ICD-10-CM | POA: Diagnosis not present

## 2018-02-06 DIAGNOSIS — I639 Cerebral infarction, unspecified: Secondary | ICD-10-CM

## 2018-02-06 DIAGNOSIS — E1169 Type 2 diabetes mellitus with other specified complication: Secondary | ICD-10-CM

## 2018-02-06 DIAGNOSIS — I10 Essential (primary) hypertension: Secondary | ICD-10-CM

## 2018-02-06 NOTE — Patient Instructions (Addendum)
Continue aspirin 325 mg daily  and lipitor  for secondary stroke prevention  Continue to follow up with your PCP regarding cholesterol, blood pressure, diabetes and sleep apnea  Continue to wear your CPAP for your sleep apnea  We will continue to monitor your loop recorder and notify you if atrial fibrillation is found  Try wrist weights for tremor on right hand. Will consider medication management if not improved  Maintain strict control of hypertension with blood pressure goal below 130/90, diabetes with hemoglobin A1c goal below 6.5% and cholesterol with LDL cholesterol (bad cholesterol) goal below 70 mg/dL. I also advised the patient to eat a healthy diet with plenty of whole grains, cereals, fruits and vegetables, exercise regularly and maintain ideal body weight.  Followup in the future with me in 6 months or call earlier if needed

## 2018-02-06 NOTE — Telephone Encounter (Signed)
Pt was at apt today and made the NP aware that he has snoring still with CPAP and that Dr Vickey Hugerohmeier had mentioned going up on the pressure to see if that might help decrease the snoring. Dr Vickey Hugerohmeier looked over his download and the apnea is treated really well at the current pressure setting and no mask leaks. She increased the machine to 9 cm of water pressure. The order has been sent to [facility]. I informed the wife of this change and she was appreciative and verbalized understanding.

## 2018-02-08 NOTE — Progress Notes (Signed)
I agree with the above plan 

## 2018-02-26 ENCOUNTER — Ambulatory Visit (INDEPENDENT_AMBULATORY_CARE_PROVIDER_SITE_OTHER): Payer: Medicare Other | Admitting: *Deleted

## 2018-02-26 DIAGNOSIS — I639 Cerebral infarction, unspecified: Secondary | ICD-10-CM | POA: Diagnosis not present

## 2018-02-27 NOTE — Progress Notes (Signed)
[facility] Summary Report / Loop Recorder 

## 2018-03-04 LAB — CUP PACEART REMOTE DEVICE CHECK
Implantable Pulse Generator Implant Date: 20180822
MDC IDC SESS DTM: 20190323200929

## 2018-03-24 LAB — CUP PACEART REMOTE DEVICE CHECK
Date Time Interrogation Session: 20190425203859
Implantable Pulse Generator Implant Date: 20180822

## 2018-03-31 ENCOUNTER — Ambulatory Visit (INDEPENDENT_AMBULATORY_CARE_PROVIDER_SITE_OTHER): Payer: Medicare Other | Admitting: *Deleted

## 2018-03-31 DIAGNOSIS — I639 Cerebral infarction, unspecified: Secondary | ICD-10-CM

## 2018-04-01 NOTE — Progress Notes (Signed)
[facility] Summary Report / Loop Recorder 

## 2018-04-22 LAB — CUP PACEART REMOTE DEVICE CHECK
Implantable Pulse Generator Implant Date: 20180822
MDC IDC SESS DTM: 20190528204051

## 2018-05-04 ENCOUNTER — Ambulatory Visit (INDEPENDENT_AMBULATORY_CARE_PROVIDER_SITE_OTHER): Payer: Medicare Other | Admitting: *Deleted

## 2018-05-04 DIAGNOSIS — I639 Cerebral infarction, unspecified: Secondary | ICD-10-CM

## 2018-05-04 NOTE — Progress Notes (Signed)
[facility] Summary Report / Loop Recorder 

## 2018-06-02 LAB — CUP PACEART REMOTE DEVICE CHECK
Date Time Interrogation Session: 20190630213524
MDC IDC PG IMPLANT DT: 20180822

## 2018-06-05 ENCOUNTER — Ambulatory Visit (INDEPENDENT_AMBULATORY_CARE_PROVIDER_SITE_OTHER): Payer: Medicare Other | Admitting: *Deleted

## 2018-06-05 DIAGNOSIS — I639 Cerebral infarction, unspecified: Secondary | ICD-10-CM

## 2018-06-08 NOTE — Progress Notes (Signed)
[facility] Summary Report / Loop Recorder 

## 2018-06-17 ENCOUNTER — Encounter: Payer: Self-pay | Admitting: Adult Health

## 2018-06-22 ENCOUNTER — Encounter: Payer: Self-pay | Admitting: Adult Health

## 2018-06-22 ENCOUNTER — Ambulatory Visit (INDEPENDENT_AMBULATORY_CARE_PROVIDER_SITE_OTHER): Payer: Medicare Other | Admitting: Adult Health

## 2018-06-22 VITALS — BP 144/82 | HR 63 | Ht 65.0 in | Wt 239.0 lb

## 2018-06-22 DIAGNOSIS — Z9989 Dependence on other enabling machines and devices: Secondary | ICD-10-CM | POA: Diagnosis not present

## 2018-06-22 DIAGNOSIS — I639 Cerebral infarction, unspecified: Secondary | ICD-10-CM | POA: Diagnosis not present

## 2018-06-22 DIAGNOSIS — G4733 Obstructive sleep apnea (adult) (pediatric): Secondary | ICD-10-CM

## 2018-06-22 NOTE — Progress Notes (Signed)
PATIENT: Raymond [name] DOB: [birth year]  REASON FOR VISIT: follow up HISTORY FROM: patient  HISTORY OF PRESENT ILLNESS: Today 06/22/18 [name] is a 67 year old male with a history of obstructive sleep apnea on CPAP.  His CPAP download indicates that he use his machine 30 out of 30 days for compliance of 100%.  He uses machine greater than 4 hours each night.  On average he uses his machine 9 hours and 43 minutes.  His residual AHI is 1.1 on 9 cmH2O with EPR of 1.  He does not have a significant leak.  He reports that this continues to work well.  He does not snore.  His Epworth sleepiness score is 5.  He returns today for evaluation.   HISTORY 12/18/17 [name] is a 67 year old male with a history of obstructive sleep apnea on CPAP.  He returns today for an evaluation.  His CPAP download indicates that he use his machine 28 out of 30 days for compliance of 93%.  He uses machine greater than 4 hours 28 out of 30 days for compliance of 93%.  On average he uses his machine 9 hours and 31 minutes.  His residual AHI is 0.9 on 7 cm of water with EPR 1.  He does not have a significant leak.  He reports that the machine is working well for him.  He does state that he has noticed when he turns over on his side he will start snoring again.  He also notes that the mask is currently irritating the sides of his face.  He returns today for evaluation.   REVIEW OF SYSTEMS: Out of a complete 14 system review of symptoms, the patient complains only of the following symptoms, and all other reviewed systems are negative.  See HPI  ALLERGIES: Allergies  Allergen Reactions  . Flomax [Tamsulosin Hcl] Other (See Comments)    HYPOtension and syncope    HOME MEDICATIONS: Outpatient Medications Prior to Visit  Medication Sig Dispense Refill  . acetaminophen (TYLENOL) 500 MG tablet Take 2 tablets (1,000 mg total) by mouth every 6 (six) hours as needed. (Patient taking differently: Take 1,000 mg by mouth  every 6 (six) hours as needed for mild pain. ) 30 tablet 0  . aspirin (BAYER ASPIRIN) 325 MG tablet Take 1 tablet (325 mg total) by mouth daily. 360 tablet 0  . atorvastatin (LIPITOR) 20 MG tablet Take 1 tablet (20 mg total) by mouth daily at 6 PM. 30 tablet 0  . hydrOXYzine (ATARAX/VISTARIL) 50 MG tablet Take 50 mg by mouth every 6 (six) hours as needed.     Marland Kitchen. levocetirizine (XYZAL) 5 MG tablet as needed.     Marland Kitchen. losartan-hydrochlorothiazide (HYZAAR) 100-25 MG tablet Take 0.5 tablets by mouth daily.    . metFORMIN (GLUCOPHAGE) 500 MG tablet Take 1 tablet (500 mg total) by mouth at bedtime.    . sertraline (ZOLOFT) 100 MG tablet Take 100 mg by mouth daily.     . verapamil (CALAN-SR) 120 MG CR tablet Take 60 mg by mouth at bedtime.     . propranolol (INDERAL) 20 MG tablet Take 20 mg by mouth.     No facility-administered medications prior to visit.     PAST MEDICAL HISTORY: Past Medical History:  Diagnosis Date  . Arthritis   . Depression   . Diabetes mellitus type 2 in obese (HCC)   . Hypertension   . Migraines   . Sleep apnea   . Stroke [facility](HCC)  PAST SURGICAL HISTORY: Past Surgical History:  Procedure Laterality Date  . CHOLECYSTECTOMY    . KNEE ARTHROSCOPY    . LOOP RECORDER INSERTION N/A 06/24/2017   Procedure: LOOP RECORDER INSERTION;  Surgeon: Hillis RangeAllred, James, MD;  Location: [facility] INVASIVE CV LAB;  Service: Cardiovascular;  Laterality: N/A;  . ROTATOR CUFF REPAIR    . TEE WITHOUT CARDIOVERSION N/A 06/24/2017   Procedure: TRANSESOPHAGEAL ECHOCARDIOGRAM (TEE) WITH LOOP;  Surgeon: Elease HashimotoNahser, Deloris PingPhilip J, MD;  [facility];  Service: Cardiovascular;  Laterality: N/A;    FAMILY HISTORY: Family History  Problem Relation Age of Onset  . Other Mother        reports in good health  . Other Father        was estranged from family, unknown medical history    SOCIAL HISTORY: Social History   Socioeconomic History  . Marital status: Married    Spouse name: Not on file  .  Number of children: Not on file  . Years of education: Not on file  . Highest education level: Not on file  Occupational History  . Not on file  Social Needs  . Financial resource strain: Not on file  . Food insecurity:    Worry: Not on file    Inability: Not on file  . Transportation needs:    Medical: Not on file    Non-medical: Not on file  Tobacco Use  . Smoking status: Light Tobacco Smoker    Types: Cigars  . Smokeless tobacco: Never Used  . Tobacco comment: chew on cigars its not lit  Substance and Sexual Activity  . Alcohol use: No    Alcohol/week: 0.0 standard drinks  . Drug use: No  . Sexual activity: Not Currently  Lifestyle  . Physical activity:    Days per week: Not on file    Minutes per session: Not on file  . Stress: Not on file  Relationships  . Social connections:    Talks on phone: Not on file    Gets together: Not on file    Attends religious service: Not on file    Active member of club or organization: Not on file    Attends meetings of clubs or organizations: Not on file    Relationship status: Not on file  . Intimate partner violence:    Fear of current or ex partner: Not on file    Emotionally abused: Not on file    Physically abused: Not on file    Forced sexual activity: Not on file  Other Topics Concern  . Not on file  Social History Narrative  . Not on file      PHYSICAL EXAM  Vitals:   06/22/18 0818  BP: (!) 144/82  Pulse: 63  SpO2: 96%  Weight: 239 lb (108.4 kg)  Height: 5\' 5"  (1.651 m)   Body mass index is 39.77 kg/m.  Generalized: Well developed, in no acute distress   Neurological examination  Mentation: Alert oriented to time, place, history taking. Follows all commands speech and language fluent Cranial nerve II-XII: Pupils were equal round reactive to light. Extraocular movements were full, visual field were full on confrontational test. Facial sensation and strength were normal. Uvula tongue midline. Head turning  and shoulder shrug  were normal and symmetric.  Neck circumference 18 inches Mallampati 3+ Motor: The motor testing reveals 5 over 5 strength of all 4 extremities. Good symmetric motor tone is noted throughout.  Sensory: Sensory testing is intact to soft touch  on all 4 extremities. No evidence of extinction is noted.  Coordination: Cerebellar testing reveals good finger-nose-finger and heel-to-shin bilaterally.  Gait and station: Gait is normal. Tandem gait is normal. Romberg is negative. No drift is seen.  Reflexes: Deep tendon reflexes are symmetric and normal bilaterally.   DIAGNOSTIC DATA (LABS, IMAGING, TESTING) - I reviewed patient records, labs, notes, testing and imaging myself where available.  Lab Results  Component Value Date   WBC 7.1 07/10/2017   HGB 15.3 07/10/2017   HCT 45.0 07/10/2017   MCV 86.3 07/10/2017   PLT 134 (L) 07/10/2017      Component Value Date/Time   NA 139 07/10/2017 1255   NA 136 10/09/2015 0000   NA 137 10/09/2015 0000   K 3.6 07/10/2017 1255   CL 101 07/10/2017 1255   CO2 25 07/10/2017 1246   GLUCOSE 163 (H) 07/10/2017 1255   BUN 21 (H) 07/10/2017 1255   BUN 10 10/09/2015 0000   BUN 11 10/09/2015 0000   CREATININE 1.00 07/10/2017 1255   CALCIUM 8.6 (L) 07/10/2017 1246   PROT 5.9 (L) 07/10/2017 1246   PROT 6.1 10/09/2015 0000   PROT 6.0 10/09/2015 0000   ALBUMIN 3.5 07/10/2017 1246   ALBUMIN 4.0 10/09/2015 0000   ALBUMIN 4.2 10/09/2015 0000   AST 20 07/10/2017 1246   ALT 22 07/10/2017 1246   ALKPHOS 32 (L) 07/10/2017 1246   BILITOT 0.9 07/10/2017 1246   BILITOT 0.5 10/09/2015 0000   BILITOT 0.4 10/09/2015 0000   GFRNONAA >60 07/10/2017 1246   GFRAA >60 07/10/2017 1246   Lab Results  Component Value Date   CHOL 168 06/22/2017   HDL 30 (L) 06/22/2017   LDLCALC 112 (H) 06/22/2017   TRIG 130 06/22/2017   CHOLHDL 5.6 06/22/2017   Lab Results  Component Value Date   HGBA1C 6.1 (H) 06/22/2017   Lab Results  Component Value Date    VITAMINB12 265 07/11/2017   Lab Results  Component Value Date   TSH 0.643 07/11/2017      ASSESSMENT AND PLAN 67 y.o. year old male  has a past medical history of Arthritis, Depression, Diabetes mellitus type 2 in obese (HCC), Hypertension, Migraines, Sleep apnea, and Stroke (HCC). here with:  1.  Obstructive sleep apnea on CPAP  The patient CPAP download shows excellent compliance and good treatment of his apnea.  He is encouraged to continue using the CPAP nightly and greater than 4 hours each night.  If his symptoms worsen or he develops new symptoms he should let us know.  He will follow-up in 1 year or sooner if needed.   I spent 15 minutes with the patient. 50% of this time was spent reviewing his CPAP download   Butch Penny, MSN, NP-C 06/22/2018, 8:33 AM [facility] [address], [address] [place], [place] [address] [phone]

## 2018-06-22 NOTE — Patient Instructions (Signed)
Your Plan:  Continue using CPAP nightly If your symptoms worsen or you develop new symptoms please let us know.   Thank you for coming to see us at [facility]. I hope we have been able to provide you high quality care today.  You may receive a patient satisfaction survey over the next few weeks. We would appreciate your feedback and comments so that we may continue to improve ourselves and the health of our patients.  

## 2018-07-08 ENCOUNTER — Ambulatory Visit: Payer: Medicare Other | Admitting: *Deleted

## 2018-07-13 LAB — CUP PACEART REMOTE DEVICE CHECK
Implantable Pulse Generator Implant Date: 20180822
MDC IDC SESS DTM: 20190802213844

## 2018-07-31 LAB — CUP PACEART REMOTE DEVICE CHECK
MDC IDC PG IMPLANT DT: 20180822
MDC IDC SESS DTM: 20190904220748

## 2018-08-04 ENCOUNTER — Encounter: Payer: Self-pay | Admitting: Adult Health

## 2018-08-04 ENCOUNTER — Ambulatory Visit (INDEPENDENT_AMBULATORY_CARE_PROVIDER_SITE_OTHER): Payer: Medicare Other | Admitting: Adult Health

## 2018-08-04 ENCOUNTER — Telehealth: Payer: Self-pay | Admitting: Adult Health

## 2018-08-04 VITALS — BP 139/78 | HR 57 | Ht 65.0 in | Wt 235.0 lb

## 2018-08-04 DIAGNOSIS — Z9989 Dependence on other enabling machines and devices: Secondary | ICD-10-CM

## 2018-08-04 DIAGNOSIS — G4733 Obstructive sleep apnea (adult) (pediatric): Secondary | ICD-10-CM

## 2018-08-04 DIAGNOSIS — I63512 Cerebral infarction due to unspecified occlusion or stenosis of left middle cerebral artery: Secondary | ICD-10-CM | POA: Diagnosis not present

## 2018-08-04 DIAGNOSIS — G214 Vascular parkinsonism: Secondary | ICD-10-CM

## 2018-08-04 DIAGNOSIS — E1169 Type 2 diabetes mellitus with other specified complication: Secondary | ICD-10-CM

## 2018-08-04 DIAGNOSIS — I10 Essential (primary) hypertension: Secondary | ICD-10-CM | POA: Diagnosis not present

## 2018-08-04 DIAGNOSIS — E785 Hyperlipidemia, unspecified: Secondary | ICD-10-CM

## 2018-08-04 DIAGNOSIS — E669 Obesity, unspecified: Secondary | ICD-10-CM

## 2018-08-04 MED ORDER — CARBIDOPA-LEVODOPA 25-100 MG PO TABS: ORAL_TABLET | ORAL | 2 refills | Status: DC

## 2018-08-04 NOTE — Progress Notes (Signed)
I agree with the above plan 

## 2018-08-04 NOTE — Telephone Encounter (Signed)
Med list updated with cardura 2mg  medication added.

## 2018-08-04 NOTE — Progress Notes (Signed)
[facility] [address] [place]. [place] [address] [phone]       OFFICE FOLLOW-UP NOTE  [name] Date of Birth:  1951-08-31 Medical Record Number:  [id number]   HPI: Mr Raymond [name] is a 28 year Caucasian male seen today in the office follow-up visit following hospital admission for stroke in August 2018. He is accompanied by his wife. History is obtained from them as well as review of electronic medical records. I have personally reviewed imaging films.Raymond [name] is an 68 y.o. male who has had multiple medical problems recently with recurrent admissions, including ear infection followed by gall bladder surgery and then pneumonia, who developed confusion and difficulty speaking on 06/21/17. He was initially seen at [facility] and was transferred to [facility] for further care. He has been more sleepy and confused. Speech deficit is described as having "a hard time getting my words out". Due to his recent ear infection, he has persisting decreased auditory acuity in his right ear. He has had "altered mental status" for "a few days" with no clearly defined time of onset hence tPA was not considered. MRI brain reviewed showed left middle cerebral artery branch infarct. MRA of the brain showed occlusion versus preocclusive stenosis of the left M1 and the distal portion with underfilled branches downstream.LE venous Doppler was negative for DVT. 2-D echo showed normal ejection fraction. Transesophageal echocardiogram showed no cord expansile embolism. Patient had loop recorder inserted. He was started on aspirin and Plavix. He was discharged but returned 2 weeks later with tremulousness of his right hand as well as some increase expressive aphasia and right hand weakness. Repeat MRI scan of the brain was obtained which showed no acute finding. LDL cholesterol was elevated at 112 mg percent and hemoglobin A1c was 6.1.  History 07/30/17 Dr. Pearlean Brownie: Patient states his speech has  improved since discharge is currently doing outpatient physical and occupation therapy. Is to lessen diminished fine motor skills and weakness in his right hand. He however noticed some intermittent tremor in his right hand but this also seems to be improving. He has significant hearing loss in the right ear but does report prior history of recurrent ear infections and tinnitus. He has seen an ENT physician and plans to try a hearing aid. He is also not happy with his CPAP device and has an appointment to see Dr. Vickey Huger tomorrow to discuss CPAP settings. He is tolerating aspirin and Plavix without bruising or bleeding. Is also tolerating Lipitor without muscle aches and pains. He states is eating healthy  but has not lost weight. He plans to be more active.  History 02/05/18: Patient returns today for six-month follow-up visit and is accompanied by his wife.  Overall, he is doing well but continues to complain of right hand action and resting tremor intermittently.  Wife states that she noticed this more at night when he is more fatigued.  This does interfere with ADLs and IADLs as he is unable to write when the tremors get bad enough, unable to hold a cup, and unable to use that hand to hold anything.  At today's visit, very fine action tremor observed but patient states that this is really good for him right now.  He is right-hand dominant.  Does have complaints of subjective right-sided weakness but this has greatly improved since his stroke.  Continues to take aspirin and tolerating that without bleeding or bruising.  Continues to take Lipitor without myalgias.  Recently had lipid  level checked and per patient, cholesterol levels satisfactory.  Most recent A1c 4.9 and continues to take metformin.  Blood pressure today's visit satisfactory at 129/74.  Has recently obtained bilateral hearing aids and this is helped with his hearing loss.  Does complain of age-related memory loss but overall this has improved  since his stroke.  Continues to wear CPAP for OSA.  Does have complaints that he continues to snore and was told by Dr. Vickey Huger to inform her if this occurs so pressure can be increased.  Will inform her nurse.  Loop recorder has not shown atrial fibrillation thus far.  2 weeks ago had right hip injections done for complaints of hip pain which did provide patient with some relief.  Denies new or worsening stroke/TIA symptoms.  Interval history 08/04/18: Patient is being seen today for scheduled office visit and is accompanied by his wife.  He continues to have right arm tremors and he believes they are worsening.  He also states that he continues to have weakness in his right upper and lower extremity.  He did try wrist weight that was discussed at prior visit but this did not help.  He is having increasing difficulties with writing, eating, holding cups for drinking, or doing any other activity with his right hand.  He denies correlation with anxiety provoking events.  Tremors are also present during rest especially if patient is focused on something else.  He also has complaints of continued right hip pain for which he received injection but this only lasted for 2 days.  Patient describes this as right hip pain but when he points to location it is located in his right lower back and right gluteus maximus. He describes the pain as a sharp stabbing pain.  He continues to take aspirin 325 mg without bleeding or bruising.  Continues to take Lipitor without side effects myalgias.  Blood pressure today satisfactory 139/78.  Wife does state that patient is not active during the day and will go to bed between midnight and 3 AM and not wake up until 1 PM the following day and typically will nap during the day as well.  Patient denies any kind of activity during the day or exercising.  He continues to be compliant with CPAP for OSA management and is followed in this office with Dr. Vickey Huger.  Loop recorder has not shown  atrial fibrillation thus far.  Denies new or worsening stroke/TIA symptoms.   ROS:   14 system review of systems is positive for tremors and weakness and all other systems negative  PMH:  Past Medical History:  Diagnosis Date  . Arthritis   . Depression   . Diabetes mellitus type 2 in obese (HCC)   . Hypertension   . Migraines   . Sleep apnea   . Stroke [facility])     Social History:  Social History   Socioeconomic History  . Marital status: Married    Spouse name: Not on file  . Number of children: Not on file  . Years of education: Not on file  . Highest education level: Not on file  Occupational History  . Not on file  Social Needs  . Financial resource strain: Not on file  . Food insecurity:    Worry: Not on file    Inability: Not on file  . Transportation needs:    Medical: Not on file    Non-medical: Not on file  Tobacco Use  . Smoking status: Light Tobacco Smoker  Types: Cigars  . Smokeless tobacco: Never Used  . Tobacco comment: chew on cigars its not lit  Substance and Sexual Activity  . Alcohol use: No    Alcohol/week: 0.0 standard drinks  . Drug use: No  . Sexual activity: Not Currently  Lifestyle  . Physical activity:    Days per week: Not on file    Minutes per session: Not on file  . Stress: Not on file  Relationships  . Social connections:    Talks on phone: Not on file    Gets together: Not on file    Attends religious service: Not on file    Active member of club or organization: Not on file    Attends meetings of clubs or organizations: Not on file    Relationship status: Not on file  . Intimate partner violence:    Fear of current or ex partner: Not on file    Emotionally abused: Not on file    Physically abused: Not on file    Forced sexual activity: Not on file  Other Topics Concern  . Not on file  Social History Narrative  . Not on file    Medications:   Current Outpatient Medications on File Prior to Visit  Medication Sig  Dispense Refill  . acetaminophen (TYLENOL) 500 MG tablet Take 2 tablets (1,000 mg total) by mouth every 6 (six) hours as needed. (Patient taking differently: Take 1,000 mg by mouth every 6 (six) hours as needed for mild pain. ) 30 tablet 0  . aspirin 325 MG tablet Take 325 mg by mouth daily.    Marland Kitchen atorvastatin (LIPITOR) 20 MG tablet Take 1 tablet (20 mg total) by mouth daily at 6 PM. 30 tablet 0  . levocetirizine (XYZAL) 5 MG tablet as needed.     Marland Kitchen losartan-hydrochlorothiazide (HYZAAR) 100-25 MG tablet Take 0.5 tablets by mouth daily.    . metFORMIN (GLUCOPHAGE) 500 MG tablet Take 1 tablet (500 mg total) by mouth at bedtime.    . sertraline (ZOLOFT) 100 MG tablet Take 100 mg by mouth daily.     . verapamil (CALAN-SR) 120 MG CR tablet Take 60 mg by mouth at bedtime.      No current facility-administered medications on file prior to visit.     Allergies:   Allergies  Allergen Reactions  . Flomax [Tamsulosin Hcl] Other (See Comments)    HYPOtension and syncope    Physical Exam General: Mildly obese middle-age Caucasian male seated, in no evident distress Head: head normocephalic and atraumatic.  Neck: supple with no carotid or supraclavicular bruits Cardiovascular: regular rate and rhythm, no murmurs Musculoskeletal: no deformity Skin:  no rash/petichiae Vascular:  Normal pulses all extremities Vitals:   08/04/18 0824  BP: 139/78  Pulse: (!) 57   Neurologic Exam Mental Status: Awake and fully alert. Oriented to place and time. Recent and remote memory intact. Attention span, concentration and fund of knowledge appropriate. Mood and affect appropriate.  Cranial Nerves: Fundoscopic exam deferred. Pupils equal, briskly reactive to light. Extraocular movements full without nystagmus. Visual fields full to confrontation. Hearing diminished in the right ear with conductive hearing loss. Facial sensation intact. Face, tongue, palate moves normally and symmetrically.  Motor: Normal bulk and  tone. unable to appreciate weakness in right upper compared to left upper extremity but does have mild right hip flexor weakness Sensory.: intact to touch ,pinprick .position and vibratory sensation. Mild increased sensation on right hand. Coordination: Rapid alternating movements normal on left side.  Finger-to-nose and heel-to-shin performed accurately on left side but difficulty with the right side.  Very mild cogwheel rigidity in right wrist.  Slight resting tremor noted but did become moderate when distracted.  Moderate action tremor noted Gait and Station: Arises from chair without difficulty. Stance is normal. Gait demonstrates normal stride length and balance with normal swing of left arm but rigid right arm.  Reflexes: 1+ and symmetric. Toes downgoing.    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 Vascular risk factors of diabetes, hypertension, hyperlipidemia, obesity and sleep apnea.  Patient is being seen today for scheduled follow-up appointment and does have continued complaints of right upper extremity tremors and right hip/back pain.  PLAN:  -Continue aspirin 325 mg daily  and lipitor  for secondary stroke prevention -f/u PCP for HTN, HLD, DM and OSA -Continue to follow with Dr. 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 Can consider increasing if no relief of tremors -Recommended to increase activity level during the day with at least 30 minutes of continuous exercise along with weight loss and eating healthy. -Right hip/back pain -increase exercise level, Tylenol as needed for pain and use of heat and ice.  Patient declined start of PT at this time but will consider in the future if  conservative measures do not relieve pain. Advised to continue to follow with PCP for this -Maintain strict control of hypertension with blood pressure goal below 130/90, diabetes with hemoglobin A1c goal below 6.5% and cholesterol with LDL cholesterol (bad cholesterol) goal below 70 mg/dL. I also advised the patient to eat a healthy diet with plenty of whole grains, cereals, fruits and vegetables, exercise regularly and maintain ideal body weight.  Followup in the future with me in 3 months or call earlier if needed  Greater than 50% time during this 25 minute consultation visit was spent on counseling and coordination of care about HLD, HTN, DM, OSA, and  obesity (risk factors), discussion about risk benefit of anticoagulation and answering questions.    George Hugh, AGNP-BC  [facility] [address] [place], [place] [address]  Phone [phone] Fax [phone]

## 2018-08-04 NOTE — Telephone Encounter (Signed)
Wife (on DPR_Connie) called back to inform the name of the medication RN Katrina needed to know about was  Cardura 2 mg

## 2018-08-04 NOTE — Progress Notes (Signed)
Rn call patients wife on dpr. Rn stated Shanda Bumps NP did start pt on sinemet 0.5 three times a day for 7 days. IF pt is tolerating titrate up to 1mg  three times a day. Rn stated rx was sent. The wife verbalized understanding, and will call back for any concerns.

## 2018-08-04 NOTE — Patient Instructions (Addendum)
Continue aspirin 325 mg daily  and lipitor 20mg   for secondary stroke prevention  Continue to follow up with PCP regarding cholesterol and blood pressure management   Continue to stay active and maintain a healthy diet  I will speak to Dr. Marjory Lies regarding tremors and we will call you with plan   For hip pain - increase exercise, use tylenol, heat and ice. Consider PT in the future if this does not help.   Continue to monitor blood pressure at home  Maintain strict control of hypertension with blood pressure goal below 130/90, diabetes with hemoglobin A1c goal below 6.5% and cholesterol with LDL cholesterol (bad cholesterol) goal below 70 mg/dL. I also advised the patient to eat a healthy diet with plenty of whole grains, cereals, fruits and vegetables, exercise regularly and maintain ideal body weight.  Followup in the future with me in 3 months or call earlier if needed       Thank you for coming to see [place] at [facility]. I hope we have been able to provide you high quality care today.  You may receive a patient satisfaction survey over the next few weeks. We would appreciate your feedback and comments so that we may continue to improve ourselves and the health of our patients.

## 2018-08-05 NOTE — Telephone Encounter (Signed)
error 

## 2018-08-10 ENCOUNTER — Ambulatory Visit (INDEPENDENT_AMBULATORY_CARE_PROVIDER_SITE_OTHER): Payer: Medicare Other | Admitting: *Deleted

## 2018-08-10 DIAGNOSIS — I639 Cerebral infarction, unspecified: Secondary | ICD-10-CM

## 2018-08-11 NOTE — Progress Notes (Signed)
[facility] Summary Report / Loop Recorder 

## 2018-08-24 ENCOUNTER — Encounter: Payer: Self-pay | Admitting: Adult Health

## 2018-08-24 LAB — CUP PACEART REMOTE DEVICE CHECK
Implantable Pulse Generator Implant Date: 20180822
MDC IDC SESS DTM: 20191007223936

## 2018-08-31 MED ORDER — PROPRANOLOL HCL 40 MG PO TABS: 40.0000 mg | ORAL_TABLET | Freq: Two times a day (BID) | ORAL | 0 refills | Status: DC

## 2018-08-31 NOTE — Telephone Encounter (Signed)
Please advise patient that as Sinemet possibly make tremors worse, recommend to start propranolol 40 mg twice daily.  Please advise him to trial this for 2 weeks and if he said tolerating well but continues to have symptoms, to call office and we can consider increasing dose at that time.  If after 1 month, his tremors have subsided with use of propranolol, to call office and we will send refills to his pharmacy.  Please advise him not to stop this medication due to potential side effects or continuation of tremors without calling office first as dosages can be adjusted.  Thank you.

## 2018-09-14 ENCOUNTER — Ambulatory Visit (INDEPENDENT_AMBULATORY_CARE_PROVIDER_SITE_OTHER): Payer: Medicare Other | Admitting: *Deleted

## 2018-09-14 DIAGNOSIS — I639 Cerebral infarction, unspecified: Secondary | ICD-10-CM | POA: Diagnosis not present

## 2018-09-14 NOTE — Progress Notes (Signed)
[facility] Summary Report / Loop Recorder 

## 2018-10-15 ENCOUNTER — Ambulatory Visit (INDEPENDENT_AMBULATORY_CARE_PROVIDER_SITE_OTHER): Payer: Medicare Other

## 2018-10-15 DIAGNOSIS — I639 Cerebral infarction, unspecified: Secondary | ICD-10-CM

## 2018-10-16 NOTE — Progress Notes (Signed)
[facility] Summary Report / Loop Recorder 

## 2018-10-31 LAB — CUP PACEART REMOTE DEVICE CHECK
Date Time Interrogation Session: 20191110010842
MDC IDC PG IMPLANT DT: 20180822

## 2018-11-06 NOTE — Progress Notes (Signed)
[facility] [address] [place]. [place] [id number] [phone]       OFFICE FOLLOW-UP NOTE  Mr. Darrel ReachRoger D Volker Date of Birth:  1951/01/22 Medical Record Number:  [id number]   Chief Complaint  Patient presents with  . Follow-up    pt alone, rm 9. pt states that last time he was given sinemet for his shaking and stopped taking since didnt work. pt says the shake/tremor is still present but intermittently     HPI: Mr [name] is a 68 year Caucasian male seen today in the office follow-up visit following hospital admission for stroke in August 2018. He is accompanied by his wife. History is obtained from them as well as review of electronic medical records. I have personally reviewed imaging films.Darrel ReachRoger D Tata is an 68 y.o. male who has had multiple medical problems recently with recurrent admissions, including ear infection followed by gall bladder surgery and then pneumonia, who developed confusion and difficulty speaking on 06/21/17. He was initially seen at [facility] and was transferred to [facility] for further care. He has been more sleepy and confused. Speech deficit is described as having "a hard time getting my words out". Due to his recent ear infection, he has persisting decreased auditory acuity in his right ear. He has had "altered mental status" for "a few days" with no clearly defined time of onset hence tPA was not considered. MRI brain reviewed showed left middle cerebral artery branch infarct. MRA of the brain showed occlusion versus preocclusive stenosis of the left M1 and the distal portion with underfilled branches downstream.LE venous Doppler was negative for DVT. 2-D echo showed normal ejection fraction. Transesophageal echocardiogram showed no cord expansile embolism. Patient had loop recorder inserted. He was started on aspirin and Plavix. He was discharged but returned 2 weeks later with tremulousness of his right hand as well as some increase expressive  aphasia and right hand weakness. Repeat MRI scan of the brain was obtained which showed no acute finding. LDL cholesterol was elevated at 112 mg percent and hemoglobin A1c was 6.1.  History 07/30/17 Dr. Pearlean BrownieSethi: Patient states his speech has improved since discharge is currently doing outpatient physical and occupation therapy. Is to lessen diminished fine motor skills and weakness in his right hand. He however noticed some intermittent tremor in his right hand but this also seems to be improving. He has significant hearing loss in the right ear but does report prior history of recurrent ear infections and tinnitus. He has seen an ENT physician and plans to try a hearing aid. He is also not happy with his CPAP device and has an appointment to see Dr. Vickey Hugerohmeier tomorrow to discuss CPAP settings. He is tolerating aspirin and Plavix without bruising or bleeding. Is also tolerating Lipitor without muscle aches and pains. He states is eating healthy  but has not lost weight. He plans to be more active.  History 02/05/18: Patient returns today for six-month follow-up visit and is accompanied by his wife.  Overall, he is doing well but continues to complain of right hand action and resting tremor intermittently.  Wife states that she noticed this more at night when he is more fatigued.  This does interfere with ADLs and IADLs as he is unable to write when the tremors get bad enough, unable to hold a cup, and unable to use that hand to hold anything.  At today's visit, very fine action tremor observed but patient states that this is really good for  him right now.  He is right-hand dominant.  Does have complaints of subjective right-sided weakness but this has greatly improved since his stroke.  Continues to take aspirin and tolerating that without bleeding or bruising.  Continues to take Lipitor without myalgias.  Recently had lipid level checked and per patient, cholesterol levels satisfactory.  Most recent A1c 4.9 and  continues to take metformin.  Blood pressure today's visit satisfactory at 129/74.  Has recently obtained bilateral hearing aids and this is helped with his hearing loss.  Does complain of age-related memory loss but overall this has improved since his stroke.  Continues to wear CPAP for OSA.  Does have complaints that he continues to snore and was told by Dr. Vickey Huger to inform her if this occurs so pressure can be increased.  Will inform her nurse.  Loop recorder has not shown atrial fibrillation thus far.  2 weeks ago had right hip injections done for complaints of hip pain which did provide patient with some relief.  Denies new or worsening stroke/TIA symptoms.  Interval history 08/04/18: Patient is being seen today for scheduled office visit and is accompanied by his wife.  He continues to have right arm tremors and he believes they are worsening.  He also states that he continues to have weakness in his right upper and lower extremity.  He did try wrist weight that was discussed at prior visit but this did not help.  He is having increasing difficulties with writing, eating, holding cups for drinking, or doing any other activity with his right hand.  He denies correlation with anxiety provoking events.  Tremors are also present during rest especially if patient is focused on something else.  He also has complaints of continued right hip pain for which he received injection but this only lasted for 2 days.  Patient describes this as right hip pain but when he points to location it is located in his right lower back and right gluteus maximus. He describes the pain as a sharp stabbing pain.  He continues to take aspirin 325 mg without bleeding or bruising.  Continues to take Lipitor without side effects myalgias.  Blood pressure today satisfactory 139/78.  Wife does state that patient is not active during the day and will go to bed between midnight and 3 AM and not wake up until 1 PM the following day and  typically will nap during the day as well.  Patient denies any kind of activity during the day or exercising.  He continues to be compliant with CPAP for OSA management and is followed in this office with Dr. Vickey Huger.  Loop recorder has not shown atrial fibrillation thus far.  Denies new or worsening stroke/TIA symptoms.  Interval history 11/09/2018: Patient is being seen today for 58-month follow-up visit.  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  MyChart message sent from patient stating that his tremors worsened after initiating Sinemet.  It was recommended to discontinue Sinemet and it was recommended to start propranolol 40 mg twice daily but unfortunately office never attempted to reach patient with these recommendations.   Referral was placed but patient did not start taking.  He does state he continues to have tremors affecting his ADLs but are intermittent and usually worsen with activity.  He continues to take aspirin 325 mg without bleeding or bruising.  Continues to take atorvastatin without side effects myalgias.  Blood pressure today 164/81.  He  does continue to monitor at home and typical SBP 130s.  He was recently switched antihypertensives to amlodipine with recommendations of discontinuing verapamil due to price.  He plans on initiating this change today.  He continues to be compliant on CPAP for OSA management.  Loop recorder has not shown atrial fibrillation thus far.  No further concerns.  Denies new or worsening stroke/TIA symptoms.    ROS:   14 system review of systems is positive for tremors and all other systems negative  PMH:  Past Medical History:  Diagnosis Date  . Arthritis   . Depression   . Diabetes mellitus type 2 in obese (HCC)   . Hypertension   . Migraines   . Sleep apnea   . Stroke [facility](HCC)     Social History:  Social History   Socioeconomic History  . Marital status: Married     Spouse name: Not on file  . Number of children: Not on file  . Years of education: Not on file  . Highest education level: Not on file  Occupational History  . Not on file  Social Needs  . Financial resource strain: Not on file  . Food insecurity:    Worry: Not on file    Inability: Not on file  . Transportation needs:    Medical: Not on file    Non-medical: Not on file  Tobacco Use  . Smoking status: Light Tobacco Smoker    Types: Cigars  . Smokeless tobacco: Never Used  . Tobacco comment: chew on cigars its not lit  Substance and Sexual Activity  . Alcohol use: No    Alcohol/week: 0.0 standard drinks  . Drug use: No  . Sexual activity: Not Currently  Lifestyle  . Physical activity:    Days per week: Not on file    Minutes per session: Not on file  . Stress: Not on file  Relationships  . Social connections:    Talks on phone: Not on file    Gets together: Not on file    Attends religious service: Not on file    Active member of club or organization: Not on file    Attends meetings of clubs or organizations: Not on file    Relationship status: Not on file  . Intimate partner violence:    Fear of current or ex partner: Not on file    Emotionally abused: Not on file    Physically abused: Not on file    Forced sexual activity: Not on file  Other Topics Concern  . Not on file  Social History Narrative  . Not on file    Medications:   Current Outpatient Medications on File Prior to Visit  Medication Sig Dispense Refill  . acetaminophen (TYLENOL) 500 MG tablet Take 2 tablets (1,000 mg total) by mouth every 6 (six) hours as needed. (Patient taking differently: Take 1,000 mg by mouth every 6 (six) hours as needed for mild pain. ) 30 tablet 0  . aspirin 325 MG tablet Take 325 mg by mouth daily.    Marland Kitchen. atorvastatin (LIPITOR) 20 MG tablet Take 1 tablet (20 mg total) by mouth daily at 6 PM. 30 tablet 0  . doxazosin (CARDURA) 2 MG tablet Take 2 mg by mouth daily.    Marland Kitchen.  levocetirizine (XYZAL) 5 MG tablet as needed.     Marland Kitchen. losartan-hydrochlorothiazide (HYZAAR) 100-25 MG tablet Take 0.5 tablets by mouth daily.    . metFORMIN (GLUCOPHAGE) 500 MG tablet Take 1 tablet (500  mg total) by mouth at bedtime.    . sertraline (ZOLOFT) 100 MG tablet Take 100 mg by mouth daily.     . verapamil (CALAN-SR) 120 MG CR tablet Take 60 mg by mouth at bedtime.      No current facility-administered medications on file prior to visit.     Allergies:   Allergies  Allergen Reactions  . Flomax [Tamsulosin Hcl] Other (See Comments)    HYPOtension and syncope    Physical Exam General: Pleasant mildly obese middle-age Caucasian male seated, in no evident distress Head: head normocephalic and atraumatic.  Neck: supple with no carotid or supraclavicular bruits Cardiovascular: regular rate and rhythm, no murmurs Musculoskeletal: no deformity Skin:  no rash/petichiae Vascular:  Normal pulses all extremities Vitals:   11/09/18 0835  BP: (!) 164/81  Pulse: (!) 53   Neurologic Exam Mental Status: Awake and fully alert. Oriented to place and time. Recent and remote memory intact. Attention span, concentration and fund of knowledge appropriate. Mood and affect appropriate.  Cranial Nerves: Fundoscopic exam deferred. Pupils equal, briskly reactive to light. Extraocular movements full without nystagmus. Visual fields full to confrontation. Hearing diminished in the right ear with conductive hearing loss. Facial sensation intact. Face, tongue, palate moves normally and symmetrically.  Motor: Normal bulk and tone.  Full strength in all tested extremities. Sensory.: intact to touch ,pinprick .position and vibratory sensation. Mild increased sensation on right hand. Coordination: Rapid alternating movements normal on left side. Finger-to-nose and heel-to-shin performed accurately on left side but difficulty with the right side.  Very mild action tremor right arm distally > left arm distally.   Unable to appreciate resting tremor. Gait and Station: Arises from chair without difficulty. Stance is normal. Gait demonstrates normal stride length and balance. Reflexes: 1+ and symmetric. Toes downgoing.    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 Vascular risk factors of diabetes, hypertension, hyperlipidemia, obesity and sleep apnea.  Patient is being seen today for scheduled follow-up appointment and does have continued complaints of right upper extremity tremors despite trial of Sinemet.   PLAN:  -Continue aspirin 325 mg daily  and lipitor  for secondary stroke prevention -f/u PCP for HTN, HLD, DM and OSA -Start propranolol 40 mg twice daily for tremors -advised patient after 2 weeks, if he is tolerating well we can consider increasing dosage if he continues to have tremors as long as his blood pressure maintained satisfactory.  Advised patient to notify office if he is unable to tolerate due to side effects -Continue to follow with Dr. Algis Liming, NP in this office for OSA management -continue to monitor loop recorder -Recommended to increase activity level during the day with at least 30 minutes of continuous exercise along with weight loss and eating healthy. -Maintain strict control of hypertension with blood pressure goal below 130/90, diabetes with hemoglobin A1c goal below 6.5% and cholesterol with LDL cholesterol (bad cholesterol) goal below 70 mg/dL. I also advised the patient to eat a healthy diet with plenty of whole grains, cereals, fruits and vegetables, exercise regularly and maintain ideal body weight.  Followup in the future with me in 3 months or call earlier if needed  Greater than 50% time during this 25 minute consultation visit was spent on counseling and coordination of care about HLD, HTN, DM, OSA, and  obesity (risk factors), discussion about risk benefit of  anticoagulation and answering questions.    George Hugh, AGNP-BC  [facility] [address] [place], [place] [address]  Phone [phone] Fax [phone]

## 2018-11-09 ENCOUNTER — Ambulatory Visit (INDEPENDENT_AMBULATORY_CARE_PROVIDER_SITE_OTHER): Payer: Medicare Other | Admitting: Adult Health

## 2018-11-09 ENCOUNTER — Encounter: Payer: Self-pay | Admitting: Adult Health

## 2018-11-09 VITALS — BP 164/81 | HR 53 | Ht 65.0 in | Wt 235.0 lb

## 2018-11-09 DIAGNOSIS — I10 Essential (primary) hypertension: Secondary | ICD-10-CM

## 2018-11-09 DIAGNOSIS — E785 Hyperlipidemia, unspecified: Secondary | ICD-10-CM

## 2018-11-09 DIAGNOSIS — I63512 Cerebral infarction due to unspecified occlusion or stenosis of left middle cerebral artery: Secondary | ICD-10-CM | POA: Diagnosis not present

## 2018-11-09 DIAGNOSIS — E1169 Type 2 diabetes mellitus with other specified complication: Secondary | ICD-10-CM

## 2018-11-09 DIAGNOSIS — G4733 Obstructive sleep apnea (adult) (pediatric): Secondary | ICD-10-CM

## 2018-11-09 DIAGNOSIS — G214 Vascular parkinsonism: Secondary | ICD-10-CM

## 2018-11-09 DIAGNOSIS — Z9989 Dependence on other enabling machines and devices: Secondary | ICD-10-CM

## 2018-11-09 DIAGNOSIS — E669 Obesity, unspecified: Secondary | ICD-10-CM

## 2018-11-09 MED ORDER — PROPRANOLOL HCL 40 MG PO TABS: 40.0000 mg | ORAL_TABLET | Freq: Two times a day (BID) | ORAL | 0 refills | Status: DC

## 2018-11-09 NOTE — Patient Instructions (Addendum)
Continue aspirin 325 mg daily  and lipitor  for secondary stroke prevention  Continue to follow up with PCP regarding cholesterol, diabetes and blood pressure management   Start propranolol 40mg  twice a day. If after 2 weeks you are tolerating well but continue to have tremors, please call office and we can consider increasing.   Continue to monitor blood pressure at home  Continue to stay active and maintain a healthy diet  Maintain strict control of hypertension with blood pressure goal below 130/90, diabetes with hemoglobin A1c goal below 6.5% and cholesterol with LDL cholesterol (bad cholesterol) goal below 70 mg/dL. I also advised the patient to eat a healthy diet with plenty of whole grains, cereals, fruits and vegetables, exercise regularly and maintain ideal body weight.  Followup in the future with me in 3 months or call earlier if needed       Thank you for coming to see [place] at [facility]. I hope we have been able to provide you high quality care today.  You may receive a patient satisfaction survey over the next few weeks. We would appreciate your feedback and comments so that we may continue to improve ourselves and the health of our patients.

## 2018-11-17 ENCOUNTER — Ambulatory Visit (INDEPENDENT_AMBULATORY_CARE_PROVIDER_SITE_OTHER): Payer: Medicare Other

## 2018-11-17 DIAGNOSIS — I639 Cerebral infarction, unspecified: Secondary | ICD-10-CM

## 2018-11-18 NOTE — Progress Notes (Signed)
[facility] Summary Report / Loop Recorder 

## 2018-11-20 LAB — CUP PACEART REMOTE DEVICE CHECK
Date Time Interrogation Session: 20200115013933
Implantable Pulse Generator Implant Date: 20180822

## 2018-11-20 NOTE — Progress Notes (Signed)
I agree with the above plan 

## 2018-11-26 LAB — CUP PACEART REMOTE DEVICE CHECK
Date Time Interrogation Session: 20191213011005
Implantable Pulse Generator Implant Date: 20180822

## 2018-12-01 ENCOUNTER — Telehealth: Payer: Self-pay | Admitting: Adult Health

## 2018-12-01 NOTE — Telephone Encounter (Signed)
Unable to get in contact with the patient due to his phone going straight to voicemail. I will try to contact at a later time.

## 2018-12-01 NOTE — Telephone Encounter (Signed)
Pt has called to give NP Shanda Bumps an update as to how he is doing on his propranolol (INDERAL) 40 MG tablet.  Pt is asking if it can be slightly increased.  Please call

## 2018-12-08 LAB — LAB REPORT - SCANNED
A1c: 9
EGFR: 71

## 2018-12-16 ENCOUNTER — Other Ambulatory Visit: Payer: Self-pay | Admitting: Neurology

## 2018-12-17 ENCOUNTER — Other Ambulatory Visit: Payer: Self-pay

## 2018-12-17 MED ORDER — PROPRANOLOL HCL 40 MG PO TABS: 40.0000 mg | ORAL_TABLET | Freq: Two times a day (BID) | ORAL | 3 refills | Status: DC

## 2018-12-21 ENCOUNTER — Ambulatory Visit (INDEPENDENT_AMBULATORY_CARE_PROVIDER_SITE_OTHER): Payer: Medicare Other

## 2018-12-21 DIAGNOSIS — I639 Cerebral infarction, unspecified: Secondary | ICD-10-CM

## 2018-12-21 LAB — CUP PACEART REMOTE DEVICE CHECK
Implantable Pulse Generator Implant Date: 20180822
MDC IDC SESS DTM: 20200217023954

## 2018-12-29 NOTE — Progress Notes (Signed)
[facility] Summary Report / Loop Recorder 

## 2019-01-22 ENCOUNTER — Ambulatory Visit (INDEPENDENT_AMBULATORY_CARE_PROVIDER_SITE_OTHER): Payer: Medicare Other | Admitting: *Deleted

## 2019-01-22 ENCOUNTER — Other Ambulatory Visit: Payer: Self-pay

## 2019-01-22 DIAGNOSIS — I639 Cerebral infarction, unspecified: Secondary | ICD-10-CM | POA: Diagnosis not present

## 2019-01-23 LAB — CUP PACEART REMOTE DEVICE CHECK
Date Time Interrogation Session: 20200321033731
Implantable Pulse Generator Implant Date: 20180822

## 2019-01-28 NOTE — Progress Notes (Signed)
[facility] Summary Report / Loop Recorder 

## 2019-02-07 NOTE — Progress Notes (Signed)
[facility] [address] [place]. [place] [address] [phone]       VIRTUAL VISIT FOLLOW-UP NOTE  [name] Date of Birth:  [birth year] Medical Record Number:  [id number]    Virtual Visit via Telephone Note  I connected with Raymond [name] on 02/08/19 at  9:45 AM EDT by telephone located remotely from office within my own home and verified that I am speaking with the correct person using two identifiers who was located within his own home.   I discussed the limitations, risks, security and privacy concerns of performing an evaluation and management service by telephone and the availability of in person appointments. I also discussed with the patient that there may be a patient responsible charge related to this service. The patient expressed understanding and agreed to proceed.    Chief Complaint  Patient presents with  . Follow-up    stroke follow up - RUE tremor post stroke     HPI:   Raymond [name] is a 68 y.o. Caucasian male with underlying medical history of DM 2, HTN, migraines and OSA on CPAP (followed by Dr. 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  He was initially scheduled in this office for a follow-up visit today but due to COVID-19 pandemic, all in office visits limited to emergent needs only therefore transitioned to telemedicine visit via WebEx. He has continued to experience residual deficit of RUE tremors and was started on propranolol after prior visit on 11/09/2018 as tremors were interfering with his daily activities.  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 He does endorse mild improvement with use of propranolol where he is able to maintain ADLs without great difficulty.  He did call office on 12/01/2018  with request of potentially increasing propranolol dose but this was not recommended by his PCP he does monitor BP at home which typically runs around 140s/80s with HR in the upper 40s to mid 50s.  He does endorse tremors typically worsening in the evening when he is resting.  No increase in tremors with increased anxiety situations. He has continued on aspirin 325 mg without side effects of bleeding or bruising.  Continues on atorvastatin without side effects myalgias.  He endorses compliance with CPAP for OSA management.  Loop recorder has not shown atrial fibrillation thus far.  No further concerns at this time.  Denies new or worsening stroke/TIA symptoms.    ROS:   14 system review of systems is positive for tremors and apnea and all other systems negative  PMH:  Past Medical History:  Diagnosis Date  . Arthritis   . Depression   . Diabetes mellitus type 2 in obese (HCC)   . Hypertension   . Migraines   . Sleep apnea   . Stroke [facility])     Social History:  Social History   Socioeconomic History  . Marital status: Married    Spouse name: Not on file  . Number of children: Not on file  . Years of education: Not on file  . Highest education level: Not on file  Occupational History  . Not on file  Social Needs  . Financial resource strain: Not on file  . Food insecurity:    Worry: Not on file    Inability: Not on file  . Transportation  needs:    Medical: Not on file    Non-medical: Not on file  Tobacco Use  . Smoking status: Light Tobacco Smoker    Types: Cigars  . Smokeless tobacco: Never Used  . Tobacco comment: chew on cigars its not lit  Substance and Sexual Activity  . Alcohol use: No    Alcohol/week: 0.0 standard drinks  . Drug use: No  . Sexual activity: Not Currently  Lifestyle  . Physical activity:    Days per week: Not on file    Minutes per session: Not on file  . Stress: Not on file  Relationships  . Social connections:    Talks on phone: Not on file     Gets together: Not on file    Attends religious service: Not on file    Active member of club or organization: Not on file    Attends meetings of clubs or organizations: Not on file    Relationship status: Not on file  . Intimate partner violence:    Fear of current or ex partner: Not on file    Emotionally abused: Not on file    Physically abused: Not on file    Forced sexual activity: Not on file  Other Topics Concern  . Not on file  Social History Narrative  . Not on file    Medications:   Current Outpatient Medications on File Prior to Visit  Medication Sig Dispense Refill  . acetaminophen (TYLENOL) 500 MG tablet Take 2 tablets (1,000 mg total) by mouth every 6 (six) hours as needed. (Patient taking differently: Take 1,000 mg by mouth every 6 (six) hours as needed for mild pain. ) 30 tablet 0  . aspirin 325 MG tablet Take 325 mg by mouth daily.    Marland Kitchen. atorvastatin (LIPITOR) 20 MG tablet Take 1 tablet (20 mg total) by mouth daily at 6 PM. 30 tablet 0  . doxazosin (CARDURA) 2 MG tablet Take 2 mg by mouth daily.    Marland Kitchen. levocetirizine (XYZAL) 5 MG tablet as needed.     Marland Kitchen. losartan-hydrochlorothiazide (HYZAAR) 100-25 MG tablet Take 0.5 tablets by mouth daily.    . metFORMIN (GLUCOPHAGE) 500 MG tablet Take 1 tablet (500 mg total) by mouth at bedtime.    . propranolol (INDERAL) 40 MG tablet Take 1 tablet (40 mg total) by mouth 2 (two) times daily. 60 tablet 3  . sertraline (ZOLOFT) 100 MG tablet Take 100 mg by mouth daily.     . verapamil (CALAN-SR) 120 MG CR tablet Take 60 mg by mouth at bedtime.      No current facility-administered medications on file prior to visit.     Allergies:   Allergies  Allergen Reactions  . Flomax [Tamsulosin Hcl] Other (See Comments)    HYPOtension and syncope    Physical Exam (objective)  *Limited due to visit type*   General: Pleasant middle-aged Caucasian male who asked and answered questions appropriately throughout visit  Neurologic Exam  Mental Status: Awake and fully alert. Oriented to place and time. Recent and remote memory intact. Attention span, concentration and fund of knowledge appropriate. Mood and affect appropriate.     ASSESSMENT: [age] year Caucasian male with left middle cerebral artery branch infarct in August 2018 of cryptogenic etiology. Vascular risk factors of diabetes, hypertension, hyperlipidemia, obesity and sleep apnea.  He has been stable from a stroke standpoint with residual stroke deficits of RUE tremors with some benefit after initiation of propranolol as he endorsed worsening of tremors  on Sinemet.   PLAN:  -Continue aspirin 325 mg daily  and lipitor  for secondary stroke prevention -Continue propranolol 40 mg twice daily -recommended morning dose upon awakening and can take second dose in the evening when he typically notices worsening -encouraged continuation of monitoring BP and heart rate and to notify our office or PCP if he experiences hypotension or further bradycardia.  Discussion regarding potentially initiating additional medication such as anticonvulsant but patient declines at this time as he feels as though it is not bad enough to initiate a second medication.  Would not recommend initiating primidone due to geriatric patient with increased risk of potential side effects.  Could consider initiating low-dose gabapentin at night to see if this helps in the future if needed.  -f/u PCP for HTN, HLD, and DM -He will continue to follow at [facility] for CPAP management -continue to monitor loop recorder -Continue stay active and maintain a healthy diet -Maintain strict control of hypertension with blood pressure goal below 130/90, diabetes with hemoglobin A1c goal below 6.5% and cholesterol with LDL cholesterol (bad cholesterol) goal below 70 mg/dL. I also advised the patient to eat a healthy diet with plenty of whole grains, cereals, fruits and vegetables, exercise regularly and maintain ideal body weight.   He has scheduled appointment with Aundra Millet, NP within our office on 06/24/2019 for follow-up regarding CPAP compliance.  Request follow-up at that time regarding tremors and ongoing management.   I discussed the assessment and treatment plan with the patient. The patient was provided an opportunity to ask questions and all were answered. The patient agreed with the plan and demonstrated an understanding of the instructions.     I provided 23 minutes of non-face-to-face time during this encounter.    George Hugh, AGNP-BC  [facility] [address] [place], [place] [address]  Phone [phone] Fax [phone]

## 2019-02-08 ENCOUNTER — Ambulatory Visit (INDEPENDENT_AMBULATORY_CARE_PROVIDER_SITE_OTHER): Payer: Medicare Other | Admitting: Adult Health

## 2019-02-08 ENCOUNTER — Other Ambulatory Visit: Payer: Self-pay

## 2019-02-08 ENCOUNTER — Encounter: Payer: Self-pay | Admitting: Adult Health

## 2019-02-08 DIAGNOSIS — G4733 Obstructive sleep apnea (adult) (pediatric): Secondary | ICD-10-CM

## 2019-02-08 DIAGNOSIS — I10 Essential (primary) hypertension: Secondary | ICD-10-CM

## 2019-02-08 DIAGNOSIS — Z9989 Dependence on other enabling machines and devices: Secondary | ICD-10-CM

## 2019-02-08 DIAGNOSIS — E669 Obesity, unspecified: Secondary | ICD-10-CM

## 2019-02-08 DIAGNOSIS — E1169 Type 2 diabetes mellitus with other specified complication: Secondary | ICD-10-CM | POA: Diagnosis not present

## 2019-02-08 DIAGNOSIS — I63512 Cerebral infarction due to unspecified occlusion or stenosis of left middle cerebral artery: Secondary | ICD-10-CM

## 2019-02-08 DIAGNOSIS — E785 Hyperlipidemia, unspecified: Secondary | ICD-10-CM

## 2019-02-08 DIAGNOSIS — G20 Parkinson's disease: Secondary | ICD-10-CM

## 2019-02-14 NOTE — Progress Notes (Signed)
I agree with the above plan 

## 2019-02-24 ENCOUNTER — Ambulatory Visit (INDEPENDENT_AMBULATORY_CARE_PROVIDER_SITE_OTHER): Payer: Medicare Other | Admitting: *Deleted

## 2019-02-24 ENCOUNTER — Other Ambulatory Visit: Payer: Self-pay

## 2019-02-24 DIAGNOSIS — I639 Cerebral infarction, unspecified: Secondary | ICD-10-CM

## 2019-02-25 LAB — CUP PACEART REMOTE DEVICE CHECK
Date Time Interrogation Session: 20200423085238
Implantable Pulse Generator Implant Date: 20180822

## 2019-03-04 NOTE — Progress Notes (Signed)
[facility] Summary Report / Loop Recorder 

## 2019-03-30 ENCOUNTER — Ambulatory Visit (INDEPENDENT_AMBULATORY_CARE_PROVIDER_SITE_OTHER): Payer: Medicare Other | Admitting: *Deleted

## 2019-03-30 DIAGNOSIS — I639 Cerebral infarction, unspecified: Secondary | ICD-10-CM

## 2019-03-30 LAB — CUP PACEART REMOTE DEVICE CHECK
Date Time Interrogation Session: 20200526100627
Implantable Pulse Generator Implant Date: 20180822

## 2019-04-06 NOTE — Progress Notes (Signed)
[facility] Summary Report / Loop Recorder 

## 2019-05-02 LAB — CUP PACEART REMOTE DEVICE CHECK
Date Time Interrogation Session: 20200628103717
Implantable Pulse Generator Implant Date: 20180822

## 2019-05-03 ENCOUNTER — Ambulatory Visit (INDEPENDENT_AMBULATORY_CARE_PROVIDER_SITE_OTHER): Payer: Medicare Other | Admitting: *Deleted

## 2019-05-03 DIAGNOSIS — I639 Cerebral infarction, unspecified: Secondary | ICD-10-CM

## 2019-05-10 NOTE — Progress Notes (Signed)
[facility] Summary Report / Loop Recorder 

## 2019-06-04 ENCOUNTER — Ambulatory Visit (INDEPENDENT_AMBULATORY_CARE_PROVIDER_SITE_OTHER): Payer: Medicare Other | Admitting: *Deleted

## 2019-06-04 DIAGNOSIS — I639 Cerebral infarction, unspecified: Secondary | ICD-10-CM | POA: Diagnosis not present

## 2019-06-04 LAB — CUP PACEART REMOTE DEVICE CHECK
Date Time Interrogation Session: 20200731081914
Implantable Pulse Generator Implant Date: 20180822

## 2019-06-10 NOTE — Progress Notes (Signed)
[facility] Summary Report / Loop Recorder 

## 2019-06-24 ENCOUNTER — Other Ambulatory Visit: Payer: Self-pay

## 2019-06-24 ENCOUNTER — Ambulatory Visit (INDEPENDENT_AMBULATORY_CARE_PROVIDER_SITE_OTHER): Payer: Medicare Other | Admitting: Adult Health

## 2019-06-24 ENCOUNTER — Encounter: Payer: Self-pay | Admitting: Adult Health

## 2019-06-24 VITALS — BP 144/80 | HR 61 | Temp 98.0°F | Ht 65.0 in | Wt 229.6 lb

## 2019-06-24 DIAGNOSIS — I639 Cerebral infarction, unspecified: Secondary | ICD-10-CM

## 2019-06-24 DIAGNOSIS — Z9989 Dependence on other enabling machines and devices: Secondary | ICD-10-CM

## 2019-06-24 DIAGNOSIS — G4733 Obstructive sleep apnea (adult) (pediatric): Secondary | ICD-10-CM

## 2019-06-24 NOTE — Patient Instructions (Signed)
Continue using CPAP nightly and greater than 4 hours each night °If your symptoms worsen or you develop new symptoms please let us know.  ° °

## 2019-06-24 NOTE — Progress Notes (Signed)
PATIENT: Raymond [name] DOB: May 08, 1951  REASON FOR VISIT: follow up HISTORY FROM: patient  HISTORY OF PRESENT ILLNESS: Today 06/24/19:  Raymond [name] is a 68 year old male with a history of obstructive sleep apnea on CPAP.  He returns today for follow-up.  His download indicates that he uses machine nightly for compliance of 100%.  He uses machine greater than 4 hours 29 days for compliance of 97%.  On average he uses his machine 9 hours and 50 minutes.  His residual AHI is 0.8 on 9 cm of water with EPR of 1.  His leak in the 95th percentile is 2.  Overall he reports that he is doing well.  He denies any new issues.  He returns today for evaluation.  HISTORY 06/22/18 Raymond [name] is a 68 year old male with a history of obstructive sleep apnea on CPAP.  His CPAP download indicates that he use his machine 30 out of 30 days for compliance of 100%.  He uses machine greater than 4 hours each night.  On average he uses his machine 9 hours and 43 minutes.  His residual AHI is 1.1 on 9 cmH2O with EPR of 1.  He does not have a significant leak.  He reports that this continues to work well.  He does not snore.  His Epworth sleepiness score is 5.  He returns today for evaluation.   REVIEW OF SYSTEMS: Out of a complete 14 system review of symptoms, the patient complains only of the following symptoms, and all other reviewed systems are negative.  Numbness, weakness, tremors  ALLERGIES: Allergies  Allergen Reactions  . Flomax [Tamsulosin Hcl] Other (See Comments)    HYPOtension and syncope    HOME MEDICATIONS: Outpatient Medications Prior to Visit  Medication Sig Dispense Refill  . acetaminophen (TYLENOL) 500 MG tablet Take 2 tablets (1,000 mg total) by mouth every 6 (six) hours as needed. (Patient taking differently: Take 1,000 mg by mouth every 6 (six) hours as needed for mild pain. ) 30 tablet 0  . aspirin 325 MG tablet Take 325 mg by mouth daily.    Marland Kitchen. atorvastatin (LIPITOR) 20 MG tablet Take 1  tablet (20 mg total) by mouth daily at 6 PM. 30 tablet 0  . doxazosin (CARDURA) 2 MG tablet Take 2 mg by mouth daily.    . hydrochlorothiazide (HYDRODIURIL) 25 MG tablet     . levocetirizine (XYZAL) 5 MG tablet as needed.     Marland Kitchen. losartan (COZAAR) 100 MG tablet     . losartan-hydrochlorothiazide (HYZAAR) 100-25 MG tablet Take 0.5 tablets by mouth daily.    . metFORMIN (GLUCOPHAGE) 500 MG tablet Take 1 tablet (500 mg total) by mouth at bedtime.    . sertraline (ZOLOFT) 100 MG tablet Take 100 mg by mouth daily.     . verapamil (CALAN-SR) 120 MG CR tablet Take 60 mg by mouth at bedtime.     . propranolol (INDERAL) 40 MG tablet Take 1 tablet (40 mg total) by mouth 2 (two) times daily. 60 tablet 3   No facility-administered medications prior to visit.     PAST MEDICAL HISTORY: Past Medical History:  Diagnosis Date  . Arthritis   . Depression   . Diabetes mellitus type 2 in obese (HCC)   . Hypertension   . Migraines   . Sleep apnea   . Stroke [facility](HCC)     PAST SURGICAL HISTORY: Past Surgical History:  Procedure Laterality Date  . CHOLECYSTECTOMY    . KNEE ARTHROSCOPY    .  LOOP RECORDER INSERTION N/A 06/24/2017   Procedure: LOOP RECORDER INSERTION;  Surgeon: Thompson Grayer, MD;  Location: [facility];  Service: Cardiovascular;  Laterality: N/A;  . ROTATOR CUFF REPAIR    . TEE WITHOUT CARDIOVERSION N/A 06/24/2017   Procedure: TRANSESOPHAGEAL ECHOCARDIOGRAM (TEE) WITH LOOP;  Surgeon: Acie Fredrickson Wonda Cheng, MD;  Location: [facility];  Service: Cardiovascular;  Laterality: N/A;    FAMILY HISTORY: Family History  Problem Relation Age of Onset  . Other Mother        reports in good health  . Other Father        was estranged from family, unknown medical history    SOCIAL HISTORY: Social History   Socioeconomic History  . Marital status: Married    Spouse name: Not on file  . Number of children: Not on file  . Years of education: Not on file  . Highest education level: Not on  file  Occupational History  . Not on file  Social Needs  . Financial resource strain: Not on file  . Food insecurity    Worry: Not on file    Inability: Not on file  . Transportation needs    Medical: Not on file    Non-medical: Not on file  Tobacco Use  . Smoking status: Light Tobacco Smoker    Types: Cigars  . Smokeless tobacco: Never Used  . Tobacco comment: chew on cigars its not lit  Substance and Sexual Activity  . Alcohol use: No    Alcohol/week: 0.0 standard drinks  . Drug use: No  . Sexual activity: Not Currently  Lifestyle  . Physical activity    Days per week: Not on file    Minutes per session: Not on file  . Stress: Not on file  Relationships  . Social Herbalist on phone: Not on file    Gets together: Not on file    Attends religious service: Not on file    Active member of club or organization: Not on file    Attends meetings of clubs or organizations: Not on file    Relationship status: Not on file  . Intimate partner violence    Fear of current or ex partner: Not on file    Emotionally abused: Not on file    Physically abused: Not on file    Forced sexual activity: Not on file  Other Topics Concern  . Not on file  Social History Narrative  . Not on file      PHYSICAL EXAM  Vitals:   06/24/19 0845  BP: (!) 144/80  Pulse: 61  Temp: 98 F (36.7 C)  Weight: 229 lb 9.6 oz (104.1 kg)  Height: 5\' 5"  (1.651 m)   Body mass index is 38.21 kg/m.  Generalized: Well developed, in no acute distress  Chest: Lungs clear to auscultation bilaterally  Neurological examination  Mentation: Alert oriented to time, place, history taking. Follows all commands speech and language fluent Cranial nerve II-XII: Extraocular movements were full, visual field were full on confrontational test. Head turning and shoulder shrug  were normal and symmetric. Motor: The motor testing reveals 5 over 5 strength of all 4 extremities. Good symmetric motor tone is  noted throughout.  Sensory: Sensory testing is intact to soft touch on all 4 extremities. No evidence of extinction is noted. .  Gait and station: Gait is normal.     DIAGNOSTIC DATA (LABS, IMAGING, TESTING) - I reviewed patient records, labs, notes, testing  and imaging myself where available.  Lab Results  Component Value Date   WBC 7.1 07/10/2017   HGB 15.3 07/10/2017   HCT 45.0 07/10/2017   MCV 86.3 07/10/2017   PLT 134 (L) 07/10/2017      Component Value Date/Time   NA 139 07/10/2017 1255   NA 136 10/09/2015 0000   NA 137 10/09/2015 0000   K 3.6 07/10/2017 1255   CL 101 07/10/2017 1255   CO2 25 07/10/2017 1246   GLUCOSE 163 (H) 07/10/2017 1255   BUN 21 (H) 07/10/2017 1255   BUN 10 10/09/2015 0000   BUN 11 10/09/2015 0000   CREATININE 1.00 07/10/2017 1255   CALCIUM 8.6 (L) 07/10/2017 1246   PROT 5.9 (L) 07/10/2017 1246   PROT 6.1 10/09/2015 0000   PROT 6.0 10/09/2015 0000   ALBUMIN 3.5 07/10/2017 1246   ALBUMIN 4.0 10/09/2015 0000   ALBUMIN 4.2 10/09/2015 0000   AST 20 07/10/2017 1246   ALT 22 07/10/2017 1246   ALKPHOS 32 (L) 07/10/2017 1246   BILITOT 0.9 07/10/2017 1246   BILITOT 0.5 10/09/2015 0000   BILITOT 0.4 10/09/2015 0000   GFRNONAA >60 07/10/2017 1246   GFRAA >60 07/10/2017 1246   Lab Results  Component Value Date   CHOL 168 06/22/2017   HDL 30 (L) 06/22/2017   LDLCALC 112 (H) 06/22/2017   TRIG 130 06/22/2017   CHOLHDL 5.6 06/22/2017   Lab Results  Component Value Date   HGBA1C 6.1 (H) 06/22/2017   Lab Results  Component Value Date   VITAMINB12 265 07/11/2017   Lab Results  Component Value Date   TSH 0.643 07/11/2017      ASSESSMENT AND PLAN 68 y.o. year old male  has a past medical history of Arthritis, Depression, Diabetes mellitus type 2 in obese (HCC), Hypertension, Migraines, Sleep apnea, and Stroke (HCC). here with:  1.  Obstructive sleep apnea on CPAP  The patient's CPAP download shows excellent compliance and good treatment  of his apnea.  He is encouraged to continue using CPAP nightly and greater than 4 hours each night.  He is advised that if his symptoms worsen or he develops new symptoms he should let us know.  He will follow-up in 1 year or sooner if needed    I spent 15 minutes with the patient. 50% of this time was spent discussing CPAP download   Butch PennyMegan Macaiah Mangal, MSN, NP-C 06/24/2019, 9:04 AM [facility] [address], [address] [place], [place] [id number] [phone]

## 2019-07-07 ENCOUNTER — Ambulatory Visit (INDEPENDENT_AMBULATORY_CARE_PROVIDER_SITE_OTHER): Payer: Medicare Other | Admitting: *Deleted

## 2019-07-07 DIAGNOSIS — I639 Cerebral infarction, unspecified: Secondary | ICD-10-CM

## 2019-07-08 LAB — CUP PACEART REMOTE DEVICE CHECK
Date Time Interrogation Session: 20200902113545
Implantable Pulse Generator Implant Date: 20180822

## 2019-07-22 NOTE — Progress Notes (Signed)
[facility] Summary Report / Loop Recorder 

## 2019-08-09 ENCOUNTER — Ambulatory Visit (INDEPENDENT_AMBULATORY_CARE_PROVIDER_SITE_OTHER): Payer: Medicare Other | Admitting: *Deleted

## 2019-08-09 DIAGNOSIS — I639 Cerebral infarction, unspecified: Secondary | ICD-10-CM

## 2019-08-09 LAB — CUP PACEART REMOTE DEVICE CHECK
Date Time Interrogation Session: 20201005113636
Implantable Pulse Generator Implant Date: 20180822

## 2019-08-16 NOTE — Progress Notes (Signed)
[facility] Summary Report / Loop Recorder 

## 2019-09-11 LAB — LAB REPORT - SCANNED
Albumin, Urine POC: 1.5
EGFR: 59
PSA, Total: 3

## 2019-09-11 LAB — CUP PACEART REMOTE DEVICE CHECK
Date Time Interrogation Session: 20201107105611
Implantable Pulse Generator Implant Date: 20180822

## 2019-09-13 ENCOUNTER — Ambulatory Visit (INDEPENDENT_AMBULATORY_CARE_PROVIDER_SITE_OTHER): Payer: Medicare Other | Admitting: *Deleted

## 2019-09-13 DIAGNOSIS — I639 Cerebral infarction, unspecified: Secondary | ICD-10-CM | POA: Diagnosis not present

## 2019-10-09 NOTE — Progress Notes (Signed)
[facility] Summary Report / Loop Recorder 

## 2019-10-14 ENCOUNTER — Ambulatory Visit (INDEPENDENT_AMBULATORY_CARE_PROVIDER_SITE_OTHER): Payer: Medicare Other | Admitting: *Deleted

## 2019-10-14 DIAGNOSIS — I639 Cerebral infarction, unspecified: Secondary | ICD-10-CM | POA: Diagnosis not present

## 2019-10-14 LAB — CUP PACEART REMOTE DEVICE CHECK
Date Time Interrogation Session: 20201210122810
Implantable Pulse Generator Implant Date: 20180822

## 2019-11-16 ENCOUNTER — Ambulatory Visit (INDEPENDENT_AMBULATORY_CARE_PROVIDER_SITE_OTHER): Payer: Medicare Other | Admitting: *Deleted

## 2019-11-16 DIAGNOSIS — I639 Cerebral infarction, unspecified: Secondary | ICD-10-CM

## 2019-11-16 LAB — CUP PACEART REMOTE DEVICE CHECK
Date Time Interrogation Session: 20210112123020
Implantable Pulse Generator Implant Date: 20180822

## 2019-11-16 NOTE — Progress Notes (Signed)
ILR remote 

## 2019-12-17 ENCOUNTER — Ambulatory Visit (INDEPENDENT_AMBULATORY_CARE_PROVIDER_SITE_OTHER): Payer: Medicare Other | Admitting: *Deleted

## 2019-12-17 DIAGNOSIS — I639 Cerebral infarction, unspecified: Secondary | ICD-10-CM | POA: Diagnosis not present

## 2019-12-17 LAB — CUP PACEART REMOTE DEVICE CHECK
Date Time Interrogation Session: 20210212124455
Implantable Pulse Generator Implant Date: 20180822

## 2019-12-18 NOTE — Progress Notes (Signed)
ILR remote 

## 2019-12-28 LAB — LAB REPORT - SCANNED
A1c: 8
EGFR: 72

## 2020-01-17 ENCOUNTER — Ambulatory Visit (INDEPENDENT_AMBULATORY_CARE_PROVIDER_SITE_OTHER): Payer: Medicare Other | Admitting: *Deleted

## 2020-01-17 DIAGNOSIS — I639 Cerebral infarction, unspecified: Secondary | ICD-10-CM | POA: Diagnosis not present

## 2020-01-18 LAB — CUP PACEART REMOTE DEVICE CHECK
Date Time Interrogation Session: 20210315135306
Implantable Pulse Generator Implant Date: 20180822

## 2020-01-18 NOTE — Progress Notes (Signed)
ILR Remote 

## 2020-02-17 ENCOUNTER — Ambulatory Visit (INDEPENDENT_AMBULATORY_CARE_PROVIDER_SITE_OTHER): Payer: Medicare Other | Admitting: *Deleted

## 2020-02-17 DIAGNOSIS — I639 Cerebral infarction, unspecified: Secondary | ICD-10-CM

## 2020-02-17 LAB — CUP PACEART REMOTE DEVICE CHECK
Date Time Interrogation Session: 20210415135546
Implantable Pulse Generator Implant Date: 20180822

## 2020-02-18 NOTE — Progress Notes (Signed)
ILR Remote 

## 2020-03-20 ENCOUNTER — Ambulatory Visit (INDEPENDENT_AMBULATORY_CARE_PROVIDER_SITE_OTHER): Payer: Medicare Other | Admitting: *Deleted

## 2020-03-20 DIAGNOSIS — I639 Cerebral infarction, unspecified: Secondary | ICD-10-CM

## 2020-03-20 LAB — CUP PACEART REMOTE DEVICE CHECK
Date Time Interrogation Session: 20210516140100
Implantable Pulse Generator Implant Date: 20180822

## 2020-03-20 NOTE — Progress Notes (Signed)
[facility] Summary Report / Loop Recorder 

## 2020-04-24 ENCOUNTER — Ambulatory Visit (INDEPENDENT_AMBULATORY_CARE_PROVIDER_SITE_OTHER): Payer: Medicare Other | Admitting: *Deleted

## 2020-04-24 DIAGNOSIS — I639 Cerebral infarction, unspecified: Secondary | ICD-10-CM

## 2020-04-24 LAB — CUP PACEART REMOTE DEVICE CHECK
Date Time Interrogation Session: 20210621005437
Implantable Pulse Generator Implant Date: 20180822

## 2020-04-24 NOTE — Progress Notes (Signed)
[facility] Summary Report / Loop Recorder 

## 2020-05-29 ENCOUNTER — Ambulatory Visit (INDEPENDENT_AMBULATORY_CARE_PROVIDER_SITE_OTHER): Payer: Medicare Other | Admitting: *Deleted

## 2020-05-29 DIAGNOSIS — I639 Cerebral infarction, unspecified: Secondary | ICD-10-CM

## 2020-05-29 LAB — CUP PACEART REMOTE DEVICE CHECK
Date Time Interrogation Session: 20210725231735
Implantable Pulse Generator Implant Date: 20180822

## 2020-05-30 NOTE — Progress Notes (Signed)
[facility] Summary Report / Loop Recorder 

## 2020-06-26 ENCOUNTER — Encounter: Payer: Self-pay | Admitting: Adult Health

## 2020-06-27 ENCOUNTER — Ambulatory Visit (INDEPENDENT_AMBULATORY_CARE_PROVIDER_SITE_OTHER): Payer: Medicare Other | Admitting: Adult Health

## 2020-06-27 VITALS — BP 154/74 | HR 52 | Ht 65.0 in | Wt 224.0 lb

## 2020-06-27 DIAGNOSIS — Z9989 Dependence on other enabling machines and devices: Secondary | ICD-10-CM | POA: Diagnosis not present

## 2020-06-27 DIAGNOSIS — I639 Cerebral infarction, unspecified: Secondary | ICD-10-CM | POA: Diagnosis not present

## 2020-06-27 DIAGNOSIS — G4733 Obstructive sleep apnea (adult) (pediatric): Secondary | ICD-10-CM

## 2020-06-27 NOTE — Progress Notes (Signed)
PATIENT: Raymond [name] DOB: Nov 28, 1950  REASON FOR VISIT: follow up HISTORY FROM: patient  HISTORY OF PRESENT ILLNESS: Today 06/27/20:  [name] is a 69 year old male with a history of obstructive sleep apnea on CPAP.  His download indicates that he uses machine nightly for compliance of 100%.  He uses machine greater than 4 hours 29 out of 30 days for compliance of 97%.  On average he uses his machine 10 hours and 11 minutes.  His residual AHI is 0.9 on 9 cm of water with EPR of 1.  Leak in the 95th percentile is 0.  HISTORY 06/24/19:  [name] is a 69 year old male with a history of obstructive sleep apnea on CPAP.  He returns today for follow-up.  His download indicates that he uses machine nightly for compliance of 100%.  He uses machine greater than 4 hours 29 days for compliance of 97%.  On average he uses his machine 9 hours and 50 minutes.  His residual AHI is 0.8 on 9 cm of water with EPR of 1.  His leak in the 95th percentile is 2.  Overall he reports that he is doing well.  He denies any new issues.  He returns today for evaluation.  REVIEW OF SYSTEMS: Out of a complete 14 system review of symptoms, the patient complains only of the following symptoms, and all other reviewed systems are negative.  FSS 16 ESS 7  ALLERGIES: Allergies  Allergen Reactions  . Flomax [Tamsulosin Hcl] Other (See Comments)    HYPOtension and syncope    HOME MEDICATIONS: Outpatient Medications Prior to Visit  Medication Sig Dispense Refill  . acetaminophen (TYLENOL) 500 MG tablet Take 2 tablets (1,000 mg total) by mouth every 6 (six) hours as needed. (Patient taking differently: Take 1,000 mg by mouth every 6 (six) hours as needed for mild pain. ) 30 tablet 0  . aspirin 325 MG tablet Take 325 mg by mouth daily.    Marland Kitchen atorvastatin (LIPITOR) 20 MG tablet Take 1 tablet (20 mg total) by mouth daily at 6 PM. 30 tablet 0  . doxazosin (CARDURA) 2 MG tablet Take 2 mg by mouth daily.    .  hydrochlorothiazide (HYDRODIURIL) 25 MG tablet     . levocetirizine (XYZAL) 5 MG tablet as needed.     Marland Kitchen losartan (COZAAR) 100 MG tablet     . losartan-hydrochlorothiazide (HYZAAR) 100-25 MG tablet Take 0.5 tablets by mouth daily.    . metFORMIN (GLUCOPHAGE) 500 MG tablet Take 1 tablet (500 mg total) by mouth at bedtime.    . sertraline (ZOLOFT) 100 MG tablet Take 100 mg by mouth daily.     . verapamil (CALAN-SR) 120 MG CR tablet Take 60 mg by mouth at bedtime.      No facility-administered medications prior to visit.    PAST MEDICAL HISTORY: Past Medical History:  Diagnosis Date  . Arthritis   . Depression   . Diabetes mellitus type 2 in obese (HCC)   . Hypertension   . Migraines   . Sleep apnea   . Stroke [facility]     PAST SURGICAL HISTORY: Past Surgical History:  Procedure Laterality Date  . CHOLECYSTECTOMY    . KNEE ARTHROSCOPY    . LOOP RECORDER INSERTION N/A 06/24/2017   Procedure: LOOP RECORDER INSERTION;  Surgeon: Hillis Range, MD;  Location: [facility] INVASIVE [facility] LAB;  Service: Cardiovascular;  Laterality: N/A;  . ROTATOR CUFF REPAIR    . TEE WITHOUT CARDIOVERSION N/A 06/24/2017  Procedure: TRANSESOPHAGEAL ECHOCARDIOGRAM (TEE) WITH LOOP;  Surgeon: Elease Hashimoto Deloris Ping, MD;  Location: [facility] ENDOSCOPY;  Service: Cardiovascular;  Laterality: N/A;    FAMILY HISTORY: Family History  Problem Relation Age of Onset  . Other Mother        reports in good health  . Other Father        was estranged from family, unknown medical history    SOCIAL HISTORY: Social History   Socioeconomic History  . Marital status: Married    Spouse name: Not on file  . Number of children: Not on file  . Years of education: Not on file  . Highest education level: Not on file  Occupational History  . Not on file  Tobacco Use  . Smoking status: Light Tobacco Smoker    Types: Cigars  . Smokeless tobacco: Never Used  . Tobacco comment: chew on cigars its not lit  Substance and Sexual Activity  .  Alcohol use: No    Alcohol/week: 0.0 standard drinks  . Drug use: No  . Sexual activity: Not Currently  Other Topics Concern  . Not on file  Social History Narrative  . Not on file   Social Determinants of Health   Financial Resource Strain:   . Difficulty of Paying Living Expenses: Not on file  Food Insecurity:   . Worried About Programme researcher, broadcasting/film/video in the Last Year: Not on file  . Ran Out of Food in the Last Year: Not on file  Transportation Needs:   . Lack of Transportation (Medical): Not on file  . Lack of Transportation (Non-Medical): Not on file  Physical Activity:   . Days of Exercise per Week: Not on file  . Minutes of Exercise per Session: Not on file  Stress:   . Feeling of Stress : Not on file  Social Connections:   . Frequency of Communication with Friends and Family: Not on file  . Frequency of Social Gatherings with Friends and Family: Not on file  . Attends Religious Services: Not on file  . Active Member of Clubs or Organizations: Not on file  . Attends Banker Meetings: Not on file  . Marital Status: Not on file  Intimate Partner Violence:   . Fear of Current or Ex-Partner: Not on file  . Emotionally Abused: Not on file  . Physically Abused: Not on file  . Sexually Abused: Not on file      PHYSICAL EXAM  Vitals:   06/27/20 0854  BP: (!) 154/74  Pulse: (!) 52  Weight: 224 lb (101.6 kg)  Height: 5\' 5"  (1.651 m)   Body mass index is 37.28 kg/m.  Generalized: Well developed, in no acute distress  Chest: Lungs clear to auscultation bilaterally  Neurological examination  Mentation: Alert oriented to time, place, history taking. Follows all commands speech and language fluent Cranial nerve II-XII: Extraocular movements were full, visual field were full on confrontational test Head turning and shoulder shrug  were normal and symmetric. Motor: The motor testing reveals 5 over 5 strength of all 4 extremities. Good symmetric motor tone is  noted throughout.  Sensory: Sensory testing is intact to soft touch on all 4 extremities. No evidence of extinction is noted.  Gait and station: Gait is normal.    DIAGNOSTIC DATA (LABS, IMAGING, TESTING) - I reviewed patient records, labs, notes, testing and imaging myself where available.  Lab Results  Component Value Date   WBC 7.1 07/10/2017   HGB 15.3 07/10/2017  HCT 45.0 07/10/2017   MCV 86.3 07/10/2017   PLT 134 (L) 07/10/2017      Component Value Date/Time   NA 139 07/10/2017 1255   NA 136 10/09/2015 0000   NA 137 10/09/2015 0000   K 3.6 07/10/2017 1255   CL 101 07/10/2017 1255   CO2 25 07/10/2017 1246   GLUCOSE 163 (H) 07/10/2017 1255   BUN 21 (H) 07/10/2017 1255   BUN 10 10/09/2015 0000   BUN 11 10/09/2015 0000   CREATININE 1.00 07/10/2017 1255   CALCIUM 8.6 (L) 07/10/2017 1246   PROT 5.9 (L) 07/10/2017 1246   PROT 6.1 10/09/2015 0000   PROT 6.0 10/09/2015 0000   ALBUMIN 3.5 07/10/2017 1246   ALBUMIN 4.0 10/09/2015 0000   ALBUMIN 4.2 10/09/2015 0000   AST 20 07/10/2017 1246   ALT 22 07/10/2017 1246   ALKPHOS 32 (L) 07/10/2017 1246   BILITOT 0.9 07/10/2017 1246   BILITOT 0.5 10/09/2015 0000   BILITOT 0.4 10/09/2015 0000   GFRNONAA >60 07/10/2017 1246   GFRAA >60 07/10/2017 1246   Lab Results  Component Value Date   CHOL 168 06/22/2017   HDL 30 (L) 06/22/2017   LDLCALC 112 (H) 06/22/2017   TRIG 130 06/22/2017   CHOLHDL 5.6 06/22/2017   Lab Results  Component Value Date   HGBA1C 6.1 (H) 06/22/2017   Lab Results  Component Value Date   VITAMINB12 265 07/11/2017   Lab Results  Component Value Date   TSH 0.643 07/11/2017      ASSESSMENT AND PLAN 69 y.o. year old male  has a past medical history of Arthritis, Depression, Diabetes mellitus type 2 in obese (HCC), Hypertension, Migraines, Sleep apnea, and Stroke (HCC). here with:  1. OSA on CPAP  - CPAP compliance excellent - Good treatment of AHI  - Encourage patient to use CPAP nightly and  > 4 hours each night - F/U in 1 year or sooner if needed   I spent 20 minutes of face-to-face and non-face-to-face time with patient.  This included previsit chart review, lab review, study review, order entry, electronic health record documentation, patient education.  Butch Penny, MSN, NP-C 06/27/2020, 9:06 AM [facility] [address] [place], [place] [address] [phone]

## 2020-06-27 NOTE — Patient Instructions (Signed)
Continue using CPAP nightly and greater than 4 hours each night °If your symptoms worsen or you develop new symptoms please let us know.  ° °

## 2020-07-02 LAB — CUP PACEART REMOTE DEVICE CHECK
Date Time Interrogation Session: 20210827231837
Implantable Pulse Generator Implant Date: 20180822

## 2020-07-03 ENCOUNTER — Ambulatory Visit (INDEPENDENT_AMBULATORY_CARE_PROVIDER_SITE_OTHER): Payer: Medicare Other | Admitting: *Deleted

## 2020-07-03 DIAGNOSIS — I639 Cerebral infarction, unspecified: Secondary | ICD-10-CM

## 2020-07-04 NOTE — Progress Notes (Signed)
[facility] Summary Report / Loop Recorder 

## 2020-08-03 LAB — CUP PACEART REMOTE DEVICE CHECK
Date Time Interrogation Session: 20210929233218
Implantable Pulse Generator Implant Date: 20180822

## 2020-08-07 ENCOUNTER — Ambulatory Visit (INDEPENDENT_AMBULATORY_CARE_PROVIDER_SITE_OTHER): Payer: Medicare Other

## 2020-08-07 DIAGNOSIS — I639 Cerebral infarction, unspecified: Secondary | ICD-10-CM

## 2020-08-08 NOTE — Progress Notes (Signed)
[facility] Summary Report / Loop Recorder 

## 2020-09-06 LAB — CUP PACEART REMOTE DEVICE CHECK
Date Time Interrogation Session: 20211101233200
Implantable Pulse Generator Implant Date: 20180822

## 2020-09-11 ENCOUNTER — Ambulatory Visit (INDEPENDENT_AMBULATORY_CARE_PROVIDER_SITE_OTHER): Payer: Medicare Other

## 2020-09-11 DIAGNOSIS — I639 Cerebral infarction, unspecified: Secondary | ICD-10-CM

## 2020-09-11 NOTE — Progress Notes (Signed)
[facility] Summary Report / Loop Recorder 

## 2020-10-13 LAB — LAB REPORT - SCANNED
A1c: 6.5
Albumin, Urine POC: 7.4
EGFR: 70
PSA, Total: 2.44

## 2020-10-14 LAB — CUP PACEART REMOTE DEVICE CHECK
Date Time Interrogation Session: 20211204223504
Implantable Pulse Generator Implant Date: 20180822

## 2020-10-16 ENCOUNTER — Ambulatory Visit (INDEPENDENT_AMBULATORY_CARE_PROVIDER_SITE_OTHER): Payer: Medicare Other

## 2020-10-16 DIAGNOSIS — I639 Cerebral infarction, unspecified: Secondary | ICD-10-CM | POA: Diagnosis not present

## 2020-10-26 NOTE — Progress Notes (Signed)
[facility] Summary Report / Loop Recorder 

## 2020-11-20 ENCOUNTER — Ambulatory Visit (INDEPENDENT_AMBULATORY_CARE_PROVIDER_SITE_OTHER): Payer: Medicare Other

## 2020-11-20 DIAGNOSIS — I639 Cerebral infarction, unspecified: Secondary | ICD-10-CM

## 2020-11-22 LAB — CUP PACEART REMOTE DEVICE CHECK
Date Time Interrogation Session: 20220115235623
Implantable Pulse Generator Implant Date: 20180822

## 2020-12-02 NOTE — Progress Notes (Signed)
[facility] Summary Report / Loop Recorder 

## 2020-12-22 LAB — CUP PACEART REMOTE DEVICE CHECK
Date Time Interrogation Session: 20220217235825
Implantable Pulse Generator Implant Date: 20180822

## 2020-12-23 ENCOUNTER — Ambulatory Visit (INDEPENDENT_AMBULATORY_CARE_PROVIDER_SITE_OTHER): Payer: Medicare Other

## 2020-12-23 DIAGNOSIS — I639 Cerebral infarction, unspecified: Secondary | ICD-10-CM

## 2021-01-15 NOTE — Progress Notes (Signed)
[facility] Summary Report / Loop Recorder 

## 2021-01-24 LAB — CUP PACEART REMOTE DEVICE CHECK
Date Time Interrogation Session: 20220323010029
Implantable Pulse Generator Implant Date: 20180822

## 2021-01-25 ENCOUNTER — Ambulatory Visit (INDEPENDENT_AMBULATORY_CARE_PROVIDER_SITE_OTHER): Payer: Medicare Other

## 2021-01-25 DIAGNOSIS — I639 Cerebral infarction, unspecified: Secondary | ICD-10-CM | POA: Diagnosis not present

## 2021-02-05 NOTE — Progress Notes (Signed)
[facility] Summary Report / Loop Recorder 

## 2021-02-14 LAB — LAB REPORT - SCANNED
A1c: 6.2
EGFR: 54

## 2021-02-27 ENCOUNTER — Ambulatory Visit (INDEPENDENT_AMBULATORY_CARE_PROVIDER_SITE_OTHER): Payer: Medicare Other

## 2021-02-27 DIAGNOSIS — I639 Cerebral infarction, unspecified: Secondary | ICD-10-CM | POA: Diagnosis not present

## 2021-02-27 LAB — CUP PACEART REMOTE DEVICE CHECK
Date Time Interrogation Session: 20220425010122
Implantable Pulse Generator Implant Date: 20180822

## 2021-03-16 ENCOUNTER — Telehealth: Payer: Self-pay

## 2021-03-16 NOTE — Telephone Encounter (Signed)
ILR reached RRT 03/15/21. Discussed options to leave device in or have it explanted. Patient would like to leave device in at this time and states he will call back if he decides to have it removed later. SPoke also to patients wife Marlowe Kays, on [place]). Advised I will have [employer] send return kit, if they can place the monitor in the envelope and seal it up and place in mailbox. Verbalized understanding. Advised to call with further questions or concerns Verbalized understanding.

## 2021-03-19 NOTE — Progress Notes (Signed)
[facility] Summary Report / Loop Recorder 

## 2021-04-02 LAB — CUP PACEART REMOTE DEVICE CHECK
Date Time Interrogation Session: 20220528011043
Implantable Pulse Generator Implant Date: 20180822

## 2021-04-03 ENCOUNTER — Ambulatory Visit (INDEPENDENT_AMBULATORY_CARE_PROVIDER_SITE_OTHER): Payer: Medicare Other

## 2021-04-03 DIAGNOSIS — I639 Cerebral infarction, unspecified: Secondary | ICD-10-CM

## 2021-04-24 NOTE — Progress Notes (Signed)
[facility] Summary Report / Loop Recorder 

## 2021-07-02 NOTE — Progress Notes (Deleted)
PATIENT: Raymond [name] DOB: 01-25-1951  REASON FOR VISIT: follow up HISTORY FROM: patient  HISTORY OF PRESENT ILLNESS: Today 07/02/21:  06/27/20: Raymond [name] is a 70 year old male with a history of obstructive sleep apnea on CPAP.  His download indicates that he uses machine nightly for compliance of 100%.  He uses machine greater than 4 hours 29 out of 30 days for compliance of 97%.  On average he uses his machine 10 hours and 11 minutes.  His residual AHI is 0.9 on 9 cm of water with EPR of 1.  Leak in the 95th percentile is 0.  HISTORY 06/24/19:   Raymond [name] is a 70 year old male with a history of obstructive sleep apnea on CPAP.  He returns today for follow-up.  His download indicates that he uses machine nightly for compliance of 100%.  He uses machine greater than 4 hours 29 days for compliance of 97%.  On average he uses his machine 9 hours and 50 minutes.  His residual AHI is 0.8 on 9 cm of water with EPR of 1.  His leak in the 95th percentile is 2.  Overall he reports that he is doing well.  He denies any new issues.  He returns today for evaluation.  REVIEW OF SYSTEMS: Out of a complete 14 system review of symptoms, the patient complains only of the following symptoms, and all other reviewed systems are negative.  FSS 16 ESS 7  ALLERGIES: Allergies  Allergen Reactions   Flomax [Tamsulosin Hcl] Other (See Comments)    HYPOtension and syncope    HOME MEDICATIONS: Outpatient Medications Prior to Visit  Medication Sig Dispense Refill   acetaminophen (TYLENOL) 500 MG tablet Take 2 tablets (1,000 mg total) by mouth every 6 (six) hours as needed. (Patient taking differently: Take 1,000 mg by mouth every 6 (six) hours as needed for mild pain. ) 30 tablet 0   aspirin 325 MG tablet Take 325 mg by mouth daily.     atorvastatin (LIPITOR) 20 MG tablet Take 1 tablet (20 mg total) by mouth daily at 6 PM. 30 tablet 0   doxazosin (CARDURA) 2 MG tablet Take 2 mg by mouth daily.      hydrochlorothiazide (HYDRODIURIL) 25 MG tablet      levocetirizine (XYZAL) 5 MG tablet as needed.      losartan (COZAAR) 100 MG tablet      losartan-hydrochlorothiazide (HYZAAR) 100-25 MG tablet Take 0.5 tablets by mouth daily.     metFORMIN (GLUCOPHAGE) 500 MG tablet Take 1 tablet (500 mg total) by mouth at bedtime.     sertraline (ZOLOFT) 100 MG tablet Take 100 mg by mouth daily.      verapamil (CALAN-SR) 120 MG CR tablet Take 60 mg by mouth at bedtime.      No facility-administered medications prior to visit.    PAST MEDICAL HISTORY: Past Medical History:  Diagnosis Date   Arthritis    Depression    Diabetes mellitus type 2 in obese (HCC)    Hypertension    Migraines    Sleep apnea    Stroke (HCC)     PAST SURGICAL HISTORY: Past Surgical History:  Procedure Laterality Date   CHOLECYSTECTOMY     KNEE ARTHROSCOPY     LOOP RECORDER INSERTION N/A 06/24/2017   Procedure: LOOP RECORDER INSERTION;  Surgeon: Hillis Range, MD;  Location: [facility] INVASIVE CV LAB;  Service: Cardiovascular;  Laterality: N/A;   ROTATOR CUFF REPAIR     TEE WITHOUT CARDIOVERSION  N/A 06/24/2017   Procedure: TRANSESOPHAGEAL ECHOCARDIOGRAM (TEE) WITH LOOP;  Surgeon: Elease Hashimoto Deloris Ping, MD;  Location: [facility] ENDOSCOPY;  Service: Cardiovascular;  Laterality: N/A;    FAMILY HISTORY: Family History  Problem Relation Age of Onset   Other Mother        reports in good health   Other Father        was estranged from family, unknown medical history    SOCIAL HISTORY: Social History   Socioeconomic History   Marital status: Married    Spouse name: Not on file   Number of children: Not on file   Years of education: Not on file   Highest education level: Not on file  Occupational History   Not on file  Tobacco Use   Smoking status: Light Smoker    Types: Cigars   Smokeless tobacco: Never   Tobacco comments:    chew on cigars its not lit  Substance and Sexual Activity   Alcohol use: No    Alcohol/week: 0.0  standard drinks   Drug use: No   Sexual activity: Not Currently  Other Topics Concern   Not on file  Social History Narrative   Not on file   Social Determinants of Health   Financial Resource Strain: Not on file  Food Insecurity: Not on file  Transportation Needs: Not on file  Physical Activity: Not on file  Stress: Not on file  Social Connections: Not on file  Intimate Partner Violence: Not on file      PHYSICAL EXAM  There were no vitals filed for this visit.  There is no height or weight on file to calculate BMI.  Generalized: Well developed, in no acute distress  Chest: Lungs clear to auscultation bilaterally  Neurological examination  Mentation: Alert oriented to time, place, history taking. Follows all commands speech and language fluent Cranial nerve II-XII: Extraocular movements were full, visual field were full on confrontational test Head turning and shoulder shrug  were normal and symmetric. Motor: The motor testing reveals 5 over 5 strength of all 4 extremities. Good symmetric motor tone is noted throughout.  Sensory: Sensory testing is intact to soft touch on all 4 extremities. No evidence of extinction is noted.  Gait and station: Gait is normal.    DIAGNOSTIC DATA (LABS, IMAGING, TESTING) - I reviewed patient records, labs, notes, testing and imaging myself where available.  Lab Results  Component Value Date   WBC 7.1 07/10/2017   HGB 15.3 07/10/2017   HCT 45.0 07/10/2017   MCV 86.3 07/10/2017   PLT 134 (L) 07/10/2017      Component Value Date/Time   NA 139 07/10/2017 1255   NA 136 10/09/2015 0000   NA 137 10/09/2015 0000   K 3.6 07/10/2017 1255   CL 101 07/10/2017 1255   CO2 25 07/10/2017 1246   GLUCOSE 163 (H) 07/10/2017 1255   BUN 21 (H) 07/10/2017 1255   BUN 10 10/09/2015 0000   BUN 11 10/09/2015 0000   CREATININE 1.00 07/10/2017 1255   CALCIUM 8.6 (L) 07/10/2017 1246   PROT 5.9 (L) 07/10/2017 1246   PROT 6.1 10/09/2015 0000   PROT  6.0 10/09/2015 0000   ALBUMIN 3.5 07/10/2017 1246   ALBUMIN 4.0 10/09/2015 0000   ALBUMIN 4.2 10/09/2015 0000   AST 20 07/10/2017 1246   ALT 22 07/10/2017 1246   ALKPHOS 32 (L) 07/10/2017 1246   BILITOT 0.9 07/10/2017 1246   BILITOT 0.5 10/09/2015 0000   BILITOT 0.4 10/09/2015  0000   GFRNONAA >60 07/10/2017 1246   GFRAA >60 07/10/2017 1246   Lab Results  Component Value Date   CHOL 168 06/22/2017   HDL 30 (L) 06/22/2017   LDLCALC 112 (H) 06/22/2017   TRIG 130 06/22/2017   CHOLHDL 5.6 06/22/2017   Lab Results  Component Value Date   HGBA1C 6.1 (H) 06/22/2017   Lab Results  Component Value Date   VITAMINB12 265 07/11/2017   Lab Results  Component Value Date   TSH 0.643 07/11/2017      ASSESSMENT AND PLAN 70 y.o. year old male  has a past medical history of Arthritis, Depression, Diabetes mellitus type 2 in obese (HCC), Hypertension, Migraines, Sleep apnea, and Stroke (HCC). here with:  OSA on CPAP  - CPAP compliance excellent - Good treatment of AHI  - Encourage patient to use CPAP nightly and > 4 hours each night - F/U in 1 year or sooner if needed   I spent 20 minutes of face-to-face and non-face-to-face time with patient.  This included previsit chart review, lab review, study review, order entry, electronic health record documentation, patient education.  Butch Penny, MSN, NP-C 07/02/2021, 6:49 PM [facility] [address] [place], [place] [address] [phone]

## 2021-07-03 ENCOUNTER — Telehealth (INDEPENDENT_AMBULATORY_CARE_PROVIDER_SITE_OTHER): Payer: Medicare Other | Admitting: Adult Health

## 2021-07-03 ENCOUNTER — Telehealth: Payer: Self-pay

## 2021-07-03 DIAGNOSIS — I639 Cerebral infarction, unspecified: Secondary | ICD-10-CM | POA: Diagnosis not present

## 2021-07-03 DIAGNOSIS — Z9989 Dependence on other enabling machines and devices: Secondary | ICD-10-CM | POA: Diagnosis not present

## 2021-07-03 DIAGNOSIS — G4733 Obstructive sleep apnea (adult) (pediatric): Secondary | ICD-10-CM

## 2021-07-03 NOTE — Telephone Encounter (Signed)
Spoke to patients wife Claiborne Billings, states they did not receive a return kit. Advised I will send [employer] an email to request one. Appreciative of follow up call.

## 2021-07-03 NOTE — Telephone Encounter (Signed)
Pt wife left a message stating she would like for the nurse to call her about her husband loop that is supposedly not working. She states there are some issues she need to discuss. Her phone number is [phone].  I cancelled all upcoming remotes and took him out of [employer]. I also marked him inactive in [employer].

## 2021-07-03 NOTE — Progress Notes (Signed)
PATIENT: Raymond [name] DOB: September 20, 1951  REASON FOR VISIT: follow up HISTORY FROM: patient PRIMARY NEUROLOGIST:   Virtual Visit via Video Note  I connected with Raymond [name] on 07/03/21 at  2:15 PM EDT by a video enabled telemedicine application located remotely at [facility] and verified that I am speaking with the correct person using two identifiers who was located at their own home.   I discussed the limitations of evaluation and management by telemedicine and the availability of in person appointments. The patient expressed understanding and agreed to proceed.   PATIENT: Raymond [name] DOB: 08-02-1951  REASON FOR VISIT: follow up HISTORY FROM: patient  HISTORY OF PRESENT ILLNESS: Today 07/03/21   Raymond [name] is a 70 year old male with a history of obstructive sleep apnea on CPAP.  He returns today for follow-up.  He reports that the CPAP is working well.  He denies any new issues.  He returns today for an evaluation.    06/27/20: Raymond [name] is a 70 year old male with a history of obstructive sleep apnea on CPAP.  His download indicates that he uses machine nightly for compliance of 100%.  He uses machine greater than 4 hours 29 out of 30 days for compliance of 97%.  On average he uses his machine 10 hours and 11 minutes.  His residual AHI is 0.9 on 9 cm of water with EPR of 1.  Leak in the 95th percentile is 0.  HISTORY 06/24/19:   Raymond [name] is a 70 year old male with a history of obstructive sleep apnea on CPAP.  He returns today for follow-up.  His download indicates that he uses machine nightly for compliance of 100%.  He uses machine greater than 4 hours 29 days for compliance of 97%.  On average he uses his machine 9 hours and 50 minutes.  His residual AHI is 0.8 on 9 cm of water with EPR of 1.  His leak in the 95th percentile is 2.  Overall he reports that he is doing well.  He denies any new issues.  He returns today for evaluation.    REVIEW OF  SYSTEMS: Out of a complete 14 system review of symptoms, the patient complains only of the following symptoms, and all other reviewed systems are negative.  ALLERGIES: Allergies  Allergen Reactions   Flomax [Tamsulosin Hcl] Other (See Comments)    HYPOtension and syncope    HOME MEDICATIONS: Outpatient Medications Prior to Visit  Medication Sig Dispense Refill   acetaminophen (TYLENOL) 500 MG tablet Take 2 tablets (1,000 mg total) by mouth every 6 (six) hours as needed. (Patient taking differently: Take 1,000 mg by mouth every 6 (six) hours as needed for mild pain. ) 30 tablet 0   aspirin 325 MG tablet Take 325 mg by mouth daily.     atorvastatin (LIPITOR) 20 MG tablet Take 1 tablet (20 mg total) by mouth daily at 6 PM. 30 tablet 0   doxazosin (CARDURA) 2 MG tablet Take 2 mg by mouth daily.     hydrochlorothiazide (HYDRODIURIL) 25 MG tablet      levocetirizine (XYZAL) 5 MG tablet as needed.      losartan (COZAAR) 100 MG tablet      losartan-hydrochlorothiazide (HYZAAR) 100-25 MG tablet Take 0.5 tablets by mouth daily.     metFORMIN (GLUCOPHAGE) 500 MG tablet Take 1 tablet (500 mg total) by mouth at bedtime.     sertraline (ZOLOFT) 100 MG tablet Take 100 mg by mouth  daily.      verapamil (CALAN-SR) 120 MG CR tablet Take 60 mg by mouth at bedtime.      No facility-administered medications prior to visit.    PAST MEDICAL HISTORY: Past Medical History:  Diagnosis Date   Arthritis    Depression    Diabetes mellitus type 2 in obese (HCC)    Hypertension    Migraines    Sleep apnea    Stroke (HCC)     PAST SURGICAL HISTORY: Past Surgical History:  Procedure Laterality Date   CHOLECYSTECTOMY     KNEE ARTHROSCOPY     LOOP RECORDER INSERTION N/A 06/24/2017   Procedure: LOOP RECORDER INSERTION;  Surgeon: Hillis Range, MD;  Location: [facility] INVASIVE CV LAB;  Service: Cardiovascular;  Laterality: N/A;   ROTATOR CUFF REPAIR     TEE WITHOUT CARDIOVERSION N/A 06/24/2017   Procedure:  TRANSESOPHAGEAL ECHOCARDIOGRAM (TEE) WITH LOOP;  Surgeon: Elease Hashimoto Deloris Ping, MD;  Location: [facility] ENDOSCOPY;  Service: Cardiovascular;  Laterality: N/A;    FAMILY HISTORY: Family History  Problem Relation Age of Onset   Other Mother        reports in good health   Other Father        was estranged from family, unknown medical history    SOCIAL HISTORY: Social History   Socioeconomic History   Marital status: Married    Spouse name: Not on file   Number of children: Not on file   Years of education: Not on file   Highest education level: Not on file  Occupational History   Not on file  Tobacco Use   Smoking status: Light Smoker    Types: Cigars   Smokeless tobacco: Never   Tobacco comments:    chew on cigars its not lit  Substance and Sexual Activity   Alcohol use: No    Alcohol/week: 0.0 standard drinks   Drug use: No   Sexual activity: Not Currently  Other Topics Concern   Not on file  Social History Narrative   Not on file   Social Determinants of Health   Financial Resource Strain: Not on file  Food Insecurity: Not on file  Transportation Needs: Not on file  Physical Activity: Not on file  Stress: Not on file  Social Connections: Not on file  Intimate Partner Violence: Not on file      PHYSICAL EXAM Generalized: Well developed, in no acute distress   Neurological examination  Mentation: Alert oriented to time, place, history taking. Follows all commands speech and language fluent Cranial nerve II-XII:Extraocular movements were full. Facial symmetry noted. uvula tongue midline. Head turning and shoulder shrug  were normal and symmetric. Motor: Good strength throughout subjectively per patient Sensory: Sensory testing is intact to soft touch on all 4 extremities subjectively per patient Coordination: Cerebellar testing reveals good finger-nose-finger  Gait and station: Patient is able to stand from a seated position. gait is normal.  Reflexes:  UTA  DIAGNOSTIC DATA (LABS, IMAGING, TESTING) - I reviewed patient records, labs, notes, testing and imaging myself where available.  Lab Results  Component Value Date   WBC 7.1 07/10/2017   HGB 15.3 07/10/2017   HCT 45.0 07/10/2017   MCV 86.3 07/10/2017   PLT 134 (L) 07/10/2017      Component Value Date/Time   NA 139 07/10/2017 1255   NA 136 10/09/2015 0000   NA 137 10/09/2015 0000   K 3.6 07/10/2017 1255   CL 101 07/10/2017 1255   CO2 25 07/10/2017  1246   GLUCOSE 163 (H) 07/10/2017 1255   BUN 21 (H) 07/10/2017 1255   BUN 10 10/09/2015 0000   BUN 11 10/09/2015 0000   CREATININE 1.00 07/10/2017 1255   CALCIUM 8.6 (L) 07/10/2017 1246   PROT 5.9 (L) 07/10/2017 1246   PROT 6.1 10/09/2015 0000   PROT 6.0 10/09/2015 0000   ALBUMIN 3.5 07/10/2017 1246   ALBUMIN 4.0 10/09/2015 0000   ALBUMIN 4.2 10/09/2015 0000   AST 20 07/10/2017 1246   ALT 22 07/10/2017 1246   ALKPHOS 32 (L) 07/10/2017 1246   BILITOT 0.9 07/10/2017 1246   BILITOT 0.5 10/09/2015 0000   BILITOT 0.4 10/09/2015 0000   GFRNONAA >60 07/10/2017 1246   GFRAA >60 07/10/2017 1246   Lab Results  Component Value Date   CHOL 168 06/22/2017   HDL 30 (L) 06/22/2017   LDLCALC 112 (H) 06/22/2017   TRIG 130 06/22/2017   CHOLHDL 5.6 06/22/2017   Lab Results  Component Value Date   HGBA1C 6.1 (H) 06/22/2017   Lab Results  Component Value Date   VITAMINB12 265 07/11/2017   Lab Results  Component Value Date   TSH 0.643 07/11/2017      ASSESSMENT AND PLAN 70 y.o. year old male  has a past medical history of Arthritis, Depression, Diabetes mellitus type 2 in obese (HCC), Hypertension, Migraines, Sleep apnea, and Stroke (HCC). here with:  OSA on CPAP  CPAP compliance excellent Residual AHI is good Encouraged patient to continue using CPAP nightly and > 4 hours each night F/U in 1 year or sooner if needed   Butch Penny, MSN, NP-C 07/03/2021, 1:44 PM [facility] [address], [address] [place], [place] [address] [phone]

## 2021-12-28 LAB — LAB REPORT - SCANNED
A1c: 5.6
Albumin, Urine POC: 10.4
EGFR: 57

## 2022-01-04 LAB — FECAL GLOBIN BY IMMUNOCHEMISTRY: Fecal Globin Immuno: NEGATIVE

## 2022-01-05 ENCOUNTER — Other Ambulatory Visit: Payer: Self-pay

## 2022-01-05 ENCOUNTER — Inpatient Hospital Stay (HOSPITAL_COMMUNITY)
Admission: EM | Admit: 2022-01-05 | Discharge: 2022-01-15 | DRG: 233 | Disposition: A | Discharge status: Home Health | Payer: Medicare Other | Attending: Thoracic Surgery (Cardiothoracic Vascular Surgery) | Admitting: Thoracic Surgery (Cardiothoracic Vascular Surgery)

## 2022-01-05 ENCOUNTER — Emergency Department (HOSPITAL_COMMUNITY): Payer: Medicare Other

## 2022-01-05 ENCOUNTER — Encounter (HOSPITAL_COMMUNITY): Payer: Self-pay | Admitting: Emergency Medicine

## 2022-01-05 DIAGNOSIS — Z951 Presence of aortocoronary bypass graft: Secondary | ICD-10-CM | POA: Diagnosis not present

## 2022-01-05 DIAGNOSIS — H903 Sensorineural hearing loss, bilateral: Secondary | ICD-10-CM | POA: Diagnosis present

## 2022-01-05 DIAGNOSIS — I5041 Acute combined systolic (congestive) and diastolic (congestive) heart failure: Secondary | ICD-10-CM

## 2022-01-05 DIAGNOSIS — E1151 Type 2 diabetes mellitus with diabetic peripheral angiopathy without gangrene: Secondary | ICD-10-CM | POA: Diagnosis present

## 2022-01-05 DIAGNOSIS — E118 Type 2 diabetes mellitus with unspecified complications: Secondary | ICD-10-CM | POA: Diagnosis not present

## 2022-01-05 DIAGNOSIS — R34 Anuria and oliguria: Secondary | ICD-10-CM | POA: Diagnosis not present

## 2022-01-05 DIAGNOSIS — F419 Anxiety disorder, unspecified: Secondary | ICD-10-CM | POA: Diagnosis present

## 2022-01-05 DIAGNOSIS — Z6841 Body Mass Index (BMI) 40.0 and over, adult: Secondary | ICD-10-CM | POA: Diagnosis not present

## 2022-01-05 DIAGNOSIS — Z01810 Encounter for preprocedural cardiovascular examination: Secondary | ICD-10-CM | POA: Diagnosis not present

## 2022-01-05 DIAGNOSIS — J939 Pneumothorax, unspecified: Secondary | ICD-10-CM

## 2022-01-05 DIAGNOSIS — K567 Ileus, unspecified: Secondary | ICD-10-CM | POA: Diagnosis not present

## 2022-01-05 DIAGNOSIS — D696 Thrombocytopenia, unspecified: Secondary | ICD-10-CM | POA: Diagnosis not present

## 2022-01-05 DIAGNOSIS — I255 Ischemic cardiomyopathy: Secondary | ICD-10-CM | POA: Diagnosis present

## 2022-01-05 DIAGNOSIS — Z20822 Contact with and (suspected) exposure to covid-19: Secondary | ICD-10-CM | POA: Diagnosis present

## 2022-01-05 DIAGNOSIS — I25110 Atherosclerotic heart disease of native coronary artery with unstable angina pectoris: Secondary | ICD-10-CM | POA: Diagnosis present

## 2022-01-05 DIAGNOSIS — I69320 Aphasia following cerebral infarction: Secondary | ICD-10-CM

## 2022-01-05 DIAGNOSIS — I237 Postinfarction angina: Secondary | ICD-10-CM | POA: Diagnosis present

## 2022-01-05 DIAGNOSIS — N990 Postprocedural (acute) (chronic) kidney failure: Secondary | ICD-10-CM | POA: Diagnosis not present

## 2022-01-05 DIAGNOSIS — R001 Bradycardia, unspecified: Secondary | ICD-10-CM | POA: Diagnosis present

## 2022-01-05 DIAGNOSIS — I509 Heart failure, unspecified: Secondary | ICD-10-CM | POA: Diagnosis present

## 2022-01-05 DIAGNOSIS — F339 Major depressive disorder, recurrent, unspecified: Secondary | ICD-10-CM | POA: Diagnosis present

## 2022-01-05 DIAGNOSIS — I480 Paroxysmal atrial fibrillation: Secondary | ICD-10-CM | POA: Diagnosis present

## 2022-01-05 DIAGNOSIS — I340 Nonrheumatic mitral (valve) insufficiency: Secondary | ICD-10-CM | POA: Diagnosis present

## 2022-01-05 DIAGNOSIS — I214 Non-ST elevation (NSTEMI) myocardial infarction: Principal | ICD-10-CM

## 2022-01-05 DIAGNOSIS — E7800 Pure hypercholesterolemia, unspecified: Secondary | ICD-10-CM | POA: Diagnosis not present

## 2022-01-05 DIAGNOSIS — D62 Acute posthemorrhagic anemia: Secondary | ICD-10-CM | POA: Diagnosis not present

## 2022-01-05 DIAGNOSIS — I513 Intracardiac thrombosis, not elsewhere classified: Secondary | ICD-10-CM | POA: Diagnosis present

## 2022-01-05 DIAGNOSIS — N400 Enlarged prostate without lower urinary tract symptoms: Secondary | ICD-10-CM | POA: Diagnosis present

## 2022-01-05 DIAGNOSIS — K9189 Other postprocedural complications and disorders of digestive system: Secondary | ICD-10-CM | POA: Diagnosis not present

## 2022-01-05 DIAGNOSIS — I6990 Unspecified sequelae of unspecified cerebrovascular disease: Secondary | ICD-10-CM

## 2022-01-05 DIAGNOSIS — I110 Hypertensive heart disease with heart failure: Secondary | ICD-10-CM | POA: Diagnosis present

## 2022-01-05 DIAGNOSIS — I2510 Atherosclerotic heart disease of native coronary artery without angina pectoris: Secondary | ICD-10-CM | POA: Diagnosis not present

## 2022-01-05 DIAGNOSIS — M1990 Unspecified osteoarthritis, unspecified site: Secondary | ICD-10-CM | POA: Diagnosis present

## 2022-01-05 DIAGNOSIS — F17290 Nicotine dependence, other tobacco product, uncomplicated: Secondary | ICD-10-CM | POA: Diagnosis present

## 2022-01-05 DIAGNOSIS — J449 Chronic obstructive pulmonary disease, unspecified: Secondary | ICD-10-CM | POA: Diagnosis present

## 2022-01-05 DIAGNOSIS — N179 Acute kidney failure, unspecified: Secondary | ICD-10-CM | POA: Diagnosis not present

## 2022-01-05 DIAGNOSIS — I959 Hypotension, unspecified: Secondary | ICD-10-CM | POA: Diagnosis not present

## 2022-01-05 DIAGNOSIS — I5042 Chronic combined systolic (congestive) and diastolic (congestive) heart failure: Secondary | ICD-10-CM | POA: Diagnosis not present

## 2022-01-05 DIAGNOSIS — Z9049 Acquired absence of other specified parts of digestive tract: Secondary | ICD-10-CM

## 2022-01-05 DIAGNOSIS — F05 Delirium due to known physiological condition: Secondary | ICD-10-CM | POA: Diagnosis not present

## 2022-01-05 DIAGNOSIS — Z888 Allergy status to other drugs, medicaments and biological substances status: Secondary | ICD-10-CM

## 2022-01-05 DIAGNOSIS — Z7982 Long term (current) use of aspirin: Secondary | ICD-10-CM

## 2022-01-05 DIAGNOSIS — E876 Hypokalemia: Secondary | ICD-10-CM | POA: Diagnosis present

## 2022-01-05 DIAGNOSIS — I10 Essential (primary) hypertension: Secondary | ICD-10-CM | POA: Diagnosis not present

## 2022-01-05 DIAGNOSIS — Z6838 Body mass index (BMI) 38.0-38.9, adult: Secondary | ICD-10-CM | POA: Diagnosis not present

## 2022-01-05 DIAGNOSIS — Z7984 Long term (current) use of oral hypoglycemic drugs: Secondary | ICD-10-CM

## 2022-01-05 DIAGNOSIS — J440 Chronic obstructive pulmonary disease with acute lower respiratory infection: Secondary | ICD-10-CM

## 2022-01-05 DIAGNOSIS — G4733 Obstructive sleep apnea (adult) (pediatric): Secondary | ICD-10-CM | POA: Diagnosis not present

## 2022-01-05 DIAGNOSIS — Z79899 Other long term (current) drug therapy: Secondary | ICD-10-CM

## 2022-01-05 DIAGNOSIS — J209 Acute bronchitis, unspecified: Secondary | ICD-10-CM | POA: Diagnosis not present

## 2022-01-05 DIAGNOSIS — I69398 Other sequelae of cerebral infarction: Secondary | ICD-10-CM

## 2022-01-05 LAB — CBC
HCT: 41.1 % (ref 39.0–52.0)
Hemoglobin: 14.3 g/dL (ref 13.0–17.0)
MCH: 30.4 pg (ref 26.0–34.0)
MCHC: 34.8 g/dL (ref 30.0–36.0)
MCV: 87.3 fL (ref 80.0–100.0)
Platelets: 166 10*3/uL (ref 150–400)
RBC: 4.71 MIL/uL (ref 4.22–5.81)
RDW: 13.5 % (ref 11.5–15.5)
WBC: 5.7 10*3/uL (ref 4.0–10.5)
nRBC: 0 % (ref 0.0–0.2)

## 2022-01-05 LAB — RESP PANEL BY RT-PCR (FLU A&B, COVID) ARPGX2
Influenza A by PCR: NEGATIVE
Influenza B by PCR: NEGATIVE
SARS Coronavirus 2 by RT PCR: NEGATIVE

## 2022-01-05 LAB — BASIC METABOLIC PANEL
Anion gap: 8 (ref 5–15)
BUN: 13 mg/dL (ref 8–23)
CO2: 25 mmol/L (ref 22–32)
Calcium: 8.7 mg/dL — ABNORMAL LOW (ref 8.9–10.3)
Chloride: 107 mmol/L (ref 98–111)
Creatinine, Ser: 1.24 mg/dL (ref 0.61–1.24)
GFR, Estimated: >60 mL/min (ref >60)
Glucose, Bld: 146 mg/dL — ABNORMAL HIGH (ref 70–99)
Potassium: 3.7 mmol/L (ref 3.5–5.1)
Sodium: 140 mmol/L (ref 135–145)

## 2022-01-05 LAB — GLUCOSE, CAPILLARY: Glucose-Capillary: 138 mg/dL — ABNORMAL HIGH (ref 70–99)

## 2022-01-05 LAB — TROPONIN I (HIGH SENSITIVITY)
Troponin I (High Sensitivity): 34 ng/L — ABNORMAL HIGH (ref <18)
Troponin I (High Sensitivity): 38 ng/L — ABNORMAL HIGH (ref <18)

## 2022-01-05 LAB — BRAIN NATRIURETIC PEPTIDE: B Natriuretic Peptide: 1096.9 pg/mL — ABNORMAL HIGH (ref 0.0–100.0)

## 2022-01-05 MED ORDER — HYDRALAZINE HCL 25 MG PO TABS
25.0000 mg | ORAL_TABLET | Freq: Once | ORAL | Status: AC
  Administered 2022-01-05: 25 mg via ORAL
  Filled 2022-01-05: qty 1

## 2022-01-05 MED ORDER — SERTRALINE HCL 100 MG PO TABS
100.0000 mg | ORAL_TABLET | Freq: Every day | ORAL | Status: DC
Start: 2022-01-06 — End: 2022-01-15
  Administered 2022-01-06 – 2022-01-15 (×10): 100 mg via ORAL
  Filled 2022-01-05 (×10): qty 1

## 2022-01-05 MED ORDER — IOHEXOL 350 MG/ML SOLN
100.0000 mL | Freq: Once | INTRAVENOUS | Status: AC | PRN
  Administered 2022-01-05: 100 mL via INTRAVENOUS

## 2022-01-05 MED ORDER — ASPIRIN 325 MG PO TABS
325.0000 mg | ORAL_TABLET | Freq: Every day | ORAL | Status: DC
Start: 2022-01-06 — End: 2022-01-07
  Administered 2022-01-06 – 2022-01-07 (×2): 325 mg via ORAL
  Filled 2022-01-05 (×2): qty 1

## 2022-01-05 MED ORDER — ATORVASTATIN CALCIUM 10 MG PO TABS
20.0000 mg | ORAL_TABLET | Freq: Every day | ORAL | Status: DC
  Administered 2022-01-06 – 2022-01-07 (×2): 20 mg via ORAL
  Filled 2022-01-05 (×2): qty 2

## 2022-01-05 MED ORDER — ENOXAPARIN SODIUM 40 MG/0.4ML IJ SOSY
40.0000 mg | PREFILLED_SYRINGE | INTRAMUSCULAR | Status: DC
  Administered 2022-01-05: 40 mg via SUBCUTANEOUS
  Filled 2022-01-05: qty 0.4

## 2022-01-05 MED ORDER — DOXAZOSIN MESYLATE 8 MG PO TABS
12.0000 mg | ORAL_TABLET | Freq: Every day | ORAL | Status: DC
  Administered 2022-01-05 – 2022-01-07 (×3): 12 mg via ORAL
  Filled 2022-01-05 (×2): qty 2
  Filled 2022-01-05: qty 1
  Filled 2022-01-05: qty 2

## 2022-01-05 MED ORDER — LOSARTAN POTASSIUM 50 MG PO TABS
100.0000 mg | ORAL_TABLET | Freq: Every day | ORAL | Status: DC
  Administered 2022-01-06 – 2022-01-07 (×2): 100 mg via ORAL
  Filled 2022-01-05 (×2): qty 2

## 2022-01-05 MED ORDER — ACETAMINOPHEN 325 MG PO TABS: 650.0000 mg | ORAL_TABLET | Freq: Four times a day (QID) | ORAL | Status: DC | PRN

## 2022-01-05 MED ORDER — HYDRALAZINE HCL 20 MG/ML IJ SOLN
10.0000 mg | Freq: Once | INTRAMUSCULAR | Status: DC
Start: 2022-01-05 — End: 2022-01-05
  Filled 2022-01-05: qty 1

## 2022-01-05 MED ORDER — POLYETHYLENE GLYCOL 3350 17 G PO PACK: 17.0000 g | PACK | Freq: Every day | ORAL | Status: DC | PRN

## 2022-01-05 MED ORDER — ACETAMINOPHEN 650 MG RE SUPP: 650.0000 mg | Freq: Four times a day (QID) | RECTAL | Status: DC | PRN

## 2022-01-05 MED ORDER — FUROSEMIDE 10 MG/ML IJ SOLN
20.0000 mg | Freq: Once | INTRAMUSCULAR | Status: AC
  Administered 2022-01-06: 20 mg via INTRAVENOUS
  Filled 2022-01-05: qty 2

## 2022-01-05 MED ORDER — FUROSEMIDE 10 MG/ML IJ SOLN
20.0000 mg | Freq: Once | INTRAMUSCULAR | Status: AC
  Administered 2022-01-05: 20 mg via INTRAVENOUS
  Filled 2022-01-05: qty 2

## 2022-01-05 NOTE — Hospital Course (Addendum)
Raymond [name] is a 71 y.o.male with a history of HTN, T2DM, HLD, OSA, anxiety/depression who was admitted to the [facility] at [facility] for cough, SOB, orthopnea, and chest pain. His hospital course is detailed below: ? ?NSTEMI  Acute HF exacerbation 2/2 ischemic cardiomyopathy  HTN ?Pt presented with increased SOB and hypertensive to 195/102 but without symptoms. BNP elevated to 1096, and elevated troponins up to ***. CXR notable for pulmonary edema w/ trace b/l pleural effusions. EKG notable for sinus rhythm without acute ST changes. Pt was initiated on lasix diuresis. CTPA showed no evidence of pneumonia or PE. Echo notable for 14 x 57mm thrombus at the level of the inferior aspect of the apical LV dyskinesia, LVEF 35-40% with mild to moderately decreased function and moderate LVH. LA mild-mod dilated. Cardiology believed patient to have suffered STEMI about 1 week prior and therefore would benefit from cardiac catheterization and potential stenting. Pt was started on Spironolactone. LHC notable for ***. ? ?Upon discharge, patient's medication plan consisted of ***. ? ? ? ? ?Hyperlipidemia  Hx of CVA (2018) ? ? ?T2DM ?***considered starting on SGLT2 for cardiac and renal protection ? ? ?Other chronic conditions were medically managed with home medications and formulary alternatives as necessary (Anxiety/depression, OSA) ? ?PCP Follow-up Recommendations: ? ?

## 2022-01-05 NOTE — ED Triage Notes (Signed)
Reports non-productive cough x 2 days.  Spoke with PCP that ordered him doxycycline and cough syrup.  States cough is worse today with SOB and wife reports "cognitive impairments" today.  Pt alert and oriented.  Follows commands.  Reports chest tightness. ?

## 2022-01-05 NOTE — ED Provider Notes (Signed)
[facility] EMERGENCY DEPARTMENT Provider Note   CSN: [id number] Arrival date & time: 01/05/22  1608     History  Chief Complaint  Patient presents with   Cough   Shortness of Breath   Chest Pain    Raymond [name] is a 71 y.o. male presenting to the ED with shortness of breath and cough.  His wife provides supplemental history.  They report he began having dry cough 3 days ago on Thursday.  It has been persistent.  He has left chest discomfort, mild, and dyspnea on exertion, orthopnea.  Denies leg swelling.  Denies fevers, chills.  Wife reports he has had multiple pneumonias in the past that are similar.  His PCP started him on doxycycline for PNA 2 days ago, he has taken 3 doses so far over 2 days.    HPI     Home Medications Prior to Admission medications   Medication Sig Start Date End Date Taking? Authorizing Provider  acetaminophen (TYLENOL) 500 MG tablet Take 2 tablets (1,000 mg total) by mouth every 6 (six) hours as needed. Patient taking differently: Take 1,000 mg by mouth every 6 (six) hours as needed for mild pain. 04/23/16  Yes Arby Barrette, MD  aspirin 325 MG tablet Take 325 mg by mouth daily.   Yes [provider]  atorvastatin (LIPITOR) 20 MG tablet Take 1 tablet (20 mg total) by mouth daily at 6 PM. 06/24/17  Yes Sheikh, Omair Latif, DO  Dextromethorphan-guaiFENesin (MUCUS RELIEF DM MAX PO) Take 20 mLs by mouth every 4 (four) hours as needed (cough).   Yes [provider]  doxazosin (CARDURA) 8 MG tablet Take 12 mg by mouth at bedtime.   Yes [provider]  doxycycline (VIBRAMYCIN) 100 MG capsule Take 100 mg by mouth See admin instructions. Bid x 7 days   Yes [provider]  ibuprofen (ADVIL) 200 MG tablet Take 400 mg by mouth every 6 (six) hours as needed for headache or moderate pain.   Yes [provider]  levocetirizine (XYZAL) 5 MG tablet Take 5 mg by mouth daily as needed for allergies. 06/02/17   Yes [provider]  losartan (COZAAR) 100 MG tablet Take 100 mg by mouth daily. 05/28/19  Yes [provider]  metFORMIN (GLUCOPHAGE) 500 MG tablet Take 1 tablet (500 mg total) by mouth at bedtime. Patient taking differently: Take 500 mg by mouth 2 (two) times daily with a meal. 07/13/17  Yes Mikhail, Nita Sells, DO  sertraline (ZOLOFT) 100 MG tablet Take 100 mg by mouth daily.  07/17/17  Yes [provider]      Allergies    Flomax [tamsulosin hcl]    Review of Systems   Review of Systems  Physical Exam Updated Vital Signs BP (!) 172/71 (BP Location: Right Arm)    Pulse 66    Temp 98.4 F (36.9 C) (Oral)    Resp 20    Ht 5\' 5"  (1.651 m)    Wt 105.5 kg    SpO2 97%    BMI 38.70 kg/m  Physical Exam Constitutional:      General: He is not in acute distress. HENT:     Head: Normocephalic and atraumatic.  Eyes:     Conjunctiva/sclera: Conjunctivae normal.     Pupils: Pupils are equal, round, and reactive to light.  Cardiovascular:     Rate and Rhythm: Normal rate and regular rhythm.  Pulmonary:     Effort: Pulmonary effort is normal. Tachypnea  present. No respiratory distress.  Abdominal:     General: There is no distension.     Tenderness: There is no abdominal tenderness.  Skin:    General: Skin is warm and dry.  Neurological:     General: No focal deficit present.     Mental Status: He is alert. Mental status is at baseline.  Psychiatric:        Mood and Affect: Mood normal.        Behavior: Behavior normal.    ED Results / Procedures / Treatments   Labs (all labs ordered are listed, but only abnormal results are displayed) Labs Reviewed  BASIC METABOLIC PANEL - Abnormal; Notable for the following components:      Result Value   Glucose, Bld 146 (*)    Calcium 8.7 (*)    All other components within normal limits  BRAIN NATRIURETIC PEPTIDE - Abnormal; Notable for the following components:   B Natriuretic Peptide 1,096.9 (*)    All other  components within normal limits  BASIC METABOLIC PANEL - Abnormal; Notable for the following components:   Potassium 3.2 (*)    Glucose, Bld 181 (*)    Creatinine, Ser 1.28 (*)    Calcium 8.6 (*)    GFR, Estimated 59 (*)    All other components within normal limits  GLUCOSE, CAPILLARY - Abnormal; Notable for the following components:   Glucose-Capillary 138 (*)    All other components within normal limits  LIPID PANEL - Abnormal; Notable for the following components:   HDL 35 (*)    All other components within normal limits  BASIC METABOLIC PANEL - Abnormal; Notable for the following components:   Potassium 3.2 (*)    Glucose, Bld 115 (*)    Creatinine, Ser 1.25 (*)    Calcium 8.6 (*)    All other components within normal limits  GLUCOSE, CAPILLARY - Abnormal; Notable for the following components:   Glucose-Capillary 126 (*)    All other components within normal limits  TROPONIN I (HIGH SENSITIVITY) - Abnormal; Notable for the following components:   Troponin I (High Sensitivity) 34 (*)    All other components within normal limits  TROPONIN I (HIGH SENSITIVITY) - Abnormal; Notable for the following components:   Troponin I (High Sensitivity) 38 (*)    All other components within normal limits  TROPONIN I (HIGH SENSITIVITY) - Abnormal; Notable for the following components:   Troponin I (High Sensitivity) 95 (*)    All other components within normal limits  TROPONIN I (HIGH SENSITIVITY) - Abnormal; Notable for the following components:   Troponin I (High Sensitivity) 97 (*)    All other components within normal limits  RESP PANEL BY RT-PCR (FLU A&B, COVID) ARPGX2  CBC  HEMOGLOBIN A1C    EKG EKG Interpretation  Date/Time:  Saturday January 05 2022 16:23:48 EST Ventricular Rate:  63 PR Interval:  172 QRS Duration: 98 QT Interval:  420 QTC Calculation: 429 R Axis:   -8 Text Interpretation: Normal sinus rhythm Nonspecific T wave abnormality Abnormal ECG When compared with  ECG of 10-Jul-2017 12:39, PREVIOUS ECG IS PRESENT Confirmed by Alvester Chou [phone]) on 01/05/2022 5:36:42 PM  Radiology DG Chest 2 View  Result Date: 01/05/2022 CLINICAL DATA:  chest pain EXAM: CHEST - 2 VIEW COMPARISON:  October 18, 2020 FINDINGS: The cardiomediastinal silhouette is unchanged and enlarged in contour.Cardiac loop recorder. Trace bilateral pleural effusions. No pneumothorax. Perihilar vascular congestion with diffuse interstitial markings. Visualized abdomen is unremarkable.  Multilevel degenerative changes of the thoracic spine. IMPRESSION: Constellation of findings are favored to reflect pulmonary edema with trace bilateral pleural effusions. Differential considerations include atypical infection. Electronically Signed   By: Meda Klinefelter M.D.   On: 01/05/2022 16:56   CT Angio Chest PE W and/or Wo Contrast  Result Date: 01/05/2022 CLINICAL DATA:  Nonproductive cough for 2 days. Cough worse today with shortness of breath. EXAM: CT ANGIOGRAPHY CHEST WITH CONTRAST TECHNIQUE: Multidetector CT imaging of the chest was performed using the standard protocol during bolus administration of intravenous contrast. Multiplanar CT image reconstructions and MIPs were obtained to evaluate the vascular anatomy. RADIATION DOSE REDUCTION: This exam was performed according to the departmental dose-optimization program which includes automated exposure control, adjustment of the mA and/or kV according to patient size and/or use of iterative reconstruction technique. CONTRAST:  OMNIPAQUE IOHEXOL 350 MG/ML SOLN COMPARISON:  Current and prior chest radiographs. FINDINGS: Cardiovascular: Pulmonary arteries are well opacified. There is no evidence of a pulmonary embolism. Heart is mildly enlarged. Trace pericardial effusion. Mild left and circumflex coronary artery calcifications. Aorta is not opacified. Is normal in caliber. No atherosclerotic calcifications. Mediastinum/Nodes: No neck base, mediastinal  or hilar masses or enlarged lymph nodes. Trachea and esophagus are unremarkable. Lungs/Pleura: Small pleural effusions. Mild interstitial thickening. Mild dependent atelectasis in the lower lobes at the posterior lung bases. Small calcified granuloma in the left upper lobe near the apex. No convincing pneumonia. No pneumothorax. Upper Abdomen: No acute abnormality. Musculoskeletal: No fracture or bone lesion.  No chest wall masses. Review of the MIP images confirms the above findings. IMPRESSION: 1. No evidence of a pulmonary embolism. 2. No convincing pneumonia. 3. Cardiomegaly, small effusions and mild interstitial thickening supports the chest radiographic impression of mild congestive heart failure. Electronically Signed   By: Amie Portland M.D.   On: 01/05/2022 18:30    Procedures .Critical Care Performed by: Terald Sleeper, MD Authorized by: Terald Sleeper, MD   Critical care provider statement:    Critical care time (minutes):  45   Critical care time was exclusive of:  Separately billable procedures and treating other patients   Critical care was necessary to treat or prevent imminent or life-threatening deterioration of the following conditions:  Cardiac failure   Critical care was time spent personally by me on the following activities:  Ordering and performing treatments and interventions, ordering and review of laboratory studies, ordering and review of radiographic studies, pulse oximetry, review of old charts, examination of patient and evaluation of patient's response to treatment   Care discussed with: admitting provider   Comments:     IV diuresis for tachypnea, CHF pulmonary edema    Medications Ordered in ED Medications  enoxaparin (LOVENOX) injection 40 mg (40 mg Subcutaneous Given 01/05/22 2101)  acetaminophen (TYLENOL) tablet 650 mg (has no administration in time range)    Or  acetaminophen (TYLENOL) suppository 650 mg (has no administration in time range)   polyethylene glycol (MIRALAX / GLYCOLAX) packet 17 g (has no administration in time range)  losartan (COZAAR) tablet 100 mg (100 mg Oral Given 01/06/22 0959)  aspirin tablet 325 mg (325 mg Oral Given 01/06/22 0959)  atorvastatin (LIPITOR) tablet 20 mg (has no administration in time range)  doxazosin (CARDURA) tablet 12 mg (12 mg Oral Given 01/05/22 2319)  sertraline (ZOLOFT) tablet 100 mg (100 mg Oral Given 01/06/22 0959)  spironolactone (ALDACTONE) tablet 25 mg (25 mg Oral Given 01/06/22 0959)  potassium chloride SA (KLOR-CON M)  CR tablet 40 mEq (40 mEq Oral Given 01/06/22 0653)  albuterol (PROVENTIL) (2.5 MG/3ML) 0.083% nebulizer solution 2.5 mg (has no administration in time range)  hydrALAZINE (APRESOLINE) tablet 25 mg (25 mg Oral Given 01/05/22 1815)  iohexol (OMNIPAQUE) 350 MG/ML injection 100 mL (100 mLs Intravenous Contrast Given 01/05/22 1821)  furosemide (LASIX) injection 20 mg (20 mg Intravenous Given 01/05/22 1914)  furosemide (LASIX) injection 20 mg (20 mg Intravenous Given 01/06/22 [id number])    ED Course/ Medical Decision Making/ A&P Clinical Course as of 01/06/22 1034  Sat Jan 05, 2022  1905 BP cuff size changed to large size, BP on recheck is 173/86, suspect initial size may have been too small.  Pt will be admitted for IV diuresis, echocardiogram, monitoring.  He does report that Flomax made him hypotensive and weak when he took it; we can try a single dose of IV lasix here as he does need a diuretic [MT]  1952 Signed out to family medicine  [MT]    Clinical Course User Index [MT] Naima Veldhuizen, Kermit BaloMatthew J, MD                           Medical Decision Making Amount and/or Complexity of Data Reviewed Labs: ordered. Radiology: ordered. ECG/medicine tests: ordered.  Risk Prescription drug management. Decision regarding hospitalization.   This patient presents to the ED with concern for shortness of breath, cough. This involves an extensive number of treatment options, and is a complaint that  carries with it a high risk of complications and morbidity.  The differential diagnosis includes PNA vs CHF vs PE vs other  Co-morbidities that complicate the patient evaluation: obes  Additional history obtained from patient's wife at bedside  External records from outside source obtained and reviewed including last echo 06/2017 with EF 45-50%  I ordered and personally interpreted labs.  The pertinent results include:  Flu/covid negative, BMP and CBC wnl, no leukocytosis, Trop 34 -> 38.  BNP 1096.    I ordered imaging studies including dg chest, CT PE I independently visualized and interpreted imaging which showed no acute PE; pulmonary edema noted I agree with the radiologist interpretation  The patient was maintained on a cardiac monitor.  I personally viewed and interpreted the cardiac monitored which showed an underlying rhythm of: sinus rhythm  Per my interpretation the patient's ECG shows sinus rhythm without acute ischemic findings  I ordered medication including po hydralazine for hypertension; IV lasix for CHF exacerbation/tachypnea   Test Considered:  - No clear evidence of PNA on CT imaging, no leukocytosis, I would defer decision for further antibiotics inpatient team, but I suspect his symptoms are likely 2/2 CHF - no chest pain or sig troponin elevation to suggest hypertensive emergency   After the interventions noted above, I reevaluated the patient and found that they have: stayed the same   Dispostion:  After consideration of the diagnostic results and the patients response to treatment, I feel that the patent would benefit from hospital admission for CHF management.         Final Clinical Impression(s) / ED Diagnoses Final diagnoses:  Congestive heart failure, unspecified HF chronicity, unspecified heart failure type [facility](HCC)    Rx / DC Orders ED Discharge Orders     None         Terald Sleeperrifan, Philip Kotlyar J, MD 01/06/22 1034

## 2022-01-05 NOTE — H&P (Addendum)
Family Medicine Teaching [facility] Admission History and Physical Service Pager: [phone]  Patient name: Raymond [name] Medical record number: [id number] Date of birth: 1951/07/01 Age: 71 y.o. Gender: male  Primary Care Provider: Forrest Moron, MD Consultants: None Code Status: FULL CODE  Preferred Emergency Contact:  Contact Information     Name Relation Home Work Mobile   Dubray,Connie Spouse  [phone]   Alvira Monday Daughter   [phone]      Chief Complaint: Shortness of breath   Assessment and Plan: Raymond [name] is a 71 y.o. male presenting with cough, shortness of breath, orthopnea, and chest pain. PMH is significant for HTN, T2DM, HLD, OSA, depression.  Shortness of breath  cough, concerning for acute HF exacerbation Reports increasing SOB this week. Was started on abx by PCP for possible pneumonia. Has been afebrile. ED work up suggestive of acute heart failure exacerbation with BNP 1,096.9, Troponin 34>38. Electrolytes stable, CBC unremarkable. CXR with pulmonary edema with trace bilateral pleural effusions. CTPA without evidence of PE and no convincing pneumonia. Last Echo 06/2017 45-50%. Doubt any infectious cause at this time, has been afebrile, no leukocytosis. Will admit for additional work up and diuresis.  -Admit to FPTS med-telemetry, attending Dr. Leveda Anna -Continuous cardiac monitoring -Vitals per floor -Continuous pulse ox -Strict I's and O's -Daily weights -Echo -Likely consult Cardiology pending Echo  -PT eval and treat  Hypertension: chronic, uncontrolled Blood pressure on presentation elevated at 195/102. Received 25 mg Hydralazine. Home medications include Losartan 100 mg. Suspect diuresis will help improve. Asymptomatic at this time.  -Continue home medications -Continuous cardiac monitoring  Hyperlipidemia: chronic Home meds include atorvastatin.  Last LDL in our chart is from 2018 and elevated at 112. -Continue  atorvastatin -We will check lipid panel  Type 2 diabetes: chronic  Home and occasions include metformin 500 mg BID.  Last A1c in our chart is 6.1 in 2018. CBG 138 in ED.  -Hemoglobin A1c -Hold Metformin -Hold CBG for now -Diet/heart healthy diet   History of stroke: 2018, hard of hearing since then. Has tremor in right arm. Generalized weakness and slower moving, per daughter.  -Continue statin  OSA -CPAP nightly  Anxiety/depression Home meds include Zoloft 100 mg daily. Reports mood is stable.  -Continue home Zoloft  FEN/GI: Heart healthy, carb modified Prophylaxis: Lovenox   Disposition: Med-Tele  History of Present Illness:  Raymond [name] is a 71 y.o. male presenting with increased shortness of breath.   States that he developed a cough earlier in the week. He called his PCP who called in some Doxycycline due to concern for possible PNA.   Today he was much more fatigued. The cough had not improved. States he went to the Urgent care and they did an EKG and a CXR. They recommended evaluation in the ED due to seeing fluid on the chest x-ray and due to elevated blood pressures.   He did take his BP medications today. No chest pain. Doesn't monitor BP home.  Can't breath well laying flat. Sleeps with 5 pillows. Does not recall ever being told about heart failure. He is a retired [employer] fundraiser. His daughter is a Physicist, medical. He is still independent in all his daily activities. Smokes cigars on occasion, beer once a year. No illicit drugs.    Review Of Systems: Per HPI with the following additions:   Review of Systems  Constitutional:  Positive for activity change and fatigue. Negative for appetite change and fever.  HENT:  Positive  for congestion and rhinorrhea.   Respiratory:  Positive for chest tightness and shortness of breath.   Cardiovascular:  Negative for chest pain, palpitations and leg swelling.  Gastrointestinal:  Positive for diarrhea. Negative for abdominal pain,  constipation, nausea and vomiting.  Genitourinary:  Positive for urgency. Negative for dysuria and hematuria.  Skin:  Negative for rash and wound.  Neurological:  Positive for dizziness and light-headedness. Negative for syncope.    Patient Active Problem List   Diagnosis Date Noted   Congestive heart failure (CHF) (HCC) 01/05/2022   Sensorineural hearing loss (SNHL) of both ears 07/31/2017   Episodic cluster headache, not intractable 07/31/2017   Right hemiparesis (HCC) 07/10/2017   History of stroke 07/10/2017   Depression 06/22/2017   Diabetes mellitus type 2 in obese (HCC) 06/22/2017   Inner ear dysfunction 06/22/2017   Essential hypertension 06/22/2017   OSA on CPAP 06/22/2017   Expressive aphasia 06/22/2017   Stroke (HCC) 06/21/2017   Cryptogenic stroke (HCC) 06/21/2017   Major depressive disorder, recurrent (HCC) 10/21/2012    Past Medical History: Past Medical History:  Diagnosis Date   Arthritis    Depression    Diabetes mellitus type 2 in obese (HCC)    Hypertension    Migraines    Sleep apnea    Stroke [facility])     Past Surgical History: Past Surgical History:  Procedure Laterality Date   CHOLECYSTECTOMY     KNEE ARTHROSCOPY     LOOP RECORDER INSERTION N/A 06/24/2017   Procedure: LOOP RECORDER INSERTION;  Surgeon: Hillis Range, MD;  Location: [facility] INVASIVE CV LAB;  Service: Cardiovascular;  Laterality: N/A;   ROTATOR CUFF REPAIR     TEE WITHOUT CARDIOVERSION N/A 06/24/2017   Procedure: TRANSESOPHAGEAL ECHOCARDIOGRAM (TEE) WITH LOOP;  Surgeon: Elease Hashimoto Deloris Ping, MD;  Location: [facility] ENDOSCOPY;  Service: Cardiovascular;  Laterality: N/A;    Social History: Social History   Tobacco Use   Smoking status: Light Smoker    Types: Cigars   Smokeless tobacco: Never   Tobacco comments:    chew on cigars its not lit  Substance Use Topics   Alcohol use: No    Alcohol/week: 0.0 standard drinks   Drug use: No   Additional social history: Lives at home with wife,  daughter, two grandchildren.   Please also refer to relevant sections of EMR.  Family History: Family History  Problem Relation Age of Onset   Other Mother        reports in good health   Other Father        was estranged from family, unknown medical history    Allergies and Medications: Allergies  Allergen Reactions   Flomax [Tamsulosin Hcl] Other (See Comments)    HYPOtension and syncope   No current facility-administered medications on file prior to encounter.   Current Outpatient Medications on File Prior to Encounter  Medication Sig Dispense Refill   acetaminophen (TYLENOL) 500 MG tablet Take 2 tablets (1,000 mg total) by mouth every 6 (six) hours as needed. (Patient taking differently: Take 1,000 mg by mouth every 6 (six) hours as needed for mild pain.) 30 tablet 0   aspirin 325 MG tablet Take 325 mg by mouth daily.     atorvastatin (LIPITOR) 20 MG tablet Take 1 tablet (20 mg total) by mouth daily at 6 PM. 30 tablet 0   Dextromethorphan-guaiFENesin (MUCUS RELIEF DM MAX PO) Take 20 mLs by mouth every 4 (four) hours as needed (cough).     doxazosin (CARDURA)  8 MG tablet Take 12 mg by mouth at bedtime.     doxycycline (VIBRAMYCIN) 100 MG capsule Take 100 mg by mouth See admin instructions. Bid x 7 days     ibuprofen (ADVIL) 200 MG tablet Take 400 mg by mouth every 6 (six) hours as needed for headache or moderate pain.     levocetirizine (XYZAL) 5 MG tablet Take 5 mg by mouth daily as needed for allergies.     losartan (COZAAR) 100 MG tablet Take 100 mg by mouth daily.     metFORMIN (GLUCOPHAGE) 500 MG tablet Take 1 tablet (500 mg total) by mouth at bedtime. (Patient taking differently: Take 500 mg by mouth 2 (two) times daily with a meal.)     sertraline (ZOLOFT) 100 MG tablet Take 100 mg by mouth daily.       Objective: BP (!) 191/96 (BP Location: Right Arm)   Pulse 68   Temp 97.9 F (36.6 C) (Oral)   Resp 20   Ht 5\' 5"  (1.651 m)   Wt 105.1 kg   SpO2 94%   BMI 38.56  kg/m  Exam: General: NAD, sitting upright in bed, pleasant  Eyes: EOMI  ENTM: Nares patent, MMM  Neck: Supple, no notable JVD  Cardiovascular: RRR without murmur Respiratory: Crackles at b/l bases, no wheezing or rhonchi Gastrointestinal: Obese, abdomen is distended, non-tender in all quadrants MSK: Moving all extremities Derm: Warm and dry  Neuro: Tremor to RUE,  Psych: Normal affect and mood   Labs and Imaging: CBC BMET  Recent Labs  Lab 01/05/22 1627  WBC 5.7  HGB 14.3  HCT 41.1  PLT 166   Recent Labs  Lab 01/05/22 1627  NA 140  K 3.7  CL 107  CO2 25  BUN 13  CREATININE 1.24  GLUCOSE 146*  CALCIUM 8.7*     EKG: NSR rate 63 bpm   DG Chest 2 View  Result Date: 01/05/2022 CLINICAL DATA:  chest pain EXAM: CHEST - 2 VIEW COMPARISON:  October 18, 2020 FINDINGS: The cardiomediastinal silhouette is unchanged and enlarged in contour.Cardiac loop recorder. Trace bilateral pleural effusions. No pneumothorax. Perihilar vascular congestion with diffuse interstitial markings. Visualized abdomen is unremarkable. Multilevel degenerative changes of the thoracic spine. IMPRESSION: Constellation of findings are favored to reflect pulmonary edema with trace bilateral pleural effusions. Differential considerations include atypical infection. Electronically Signed   By: Meda Klinefelter M.D.   On: 01/05/2022 16:56   CT Angio Chest PE W and/or Wo Contrast  Result Date: 01/05/2022 CLINICAL DATA:  Nonproductive cough for 2 days. Cough worse today with shortness of breath. EXAM: CT ANGIOGRAPHY CHEST WITH CONTRAST TECHNIQUE: Multidetector CT imaging of the chest was performed using the standard protocol during bolus administration of intravenous contrast. Multiplanar CT image reconstructions and MIPs were obtained to evaluate the vascular anatomy. RADIATION DOSE REDUCTION: This exam was performed according to the departmental dose-optimization program which includes automated exposure control,  adjustment of the mA and/or kV according to patient size and/or use of iterative reconstruction technique. CONTRAST:  OMNIPAQUE IOHEXOL 350 MG/ML SOLN COMPARISON:  Current and prior chest radiographs. FINDINGS: Cardiovascular: Pulmonary arteries are well opacified. There is no evidence of a pulmonary embolism. Heart is mildly enlarged. Trace pericardial effusion. Mild left and circumflex coronary artery calcifications. Aorta is not opacified. Is normal in caliber. No atherosclerotic calcifications. Mediastinum/Nodes: No neck base, mediastinal or hilar masses or enlarged lymph nodes. Trachea and esophagus are unremarkable. Lungs/Pleura: Small pleural effusions. Mild interstitial thickening. Mild  dependent atelectasis in the lower lobes at the posterior lung bases. Small calcified granuloma in the left upper lobe near the apex. No convincing pneumonia. No pneumothorax. Upper Abdomen: No acute abnormality. Musculoskeletal: No fracture or bone lesion.  No chest wall masses. Review of the MIP images confirms the above findings. IMPRESSION: 1. No evidence of a pulmonary embolism. 2. No convincing pneumonia. 3. Cardiomegaly, small effusions and mild interstitial thickening supports the chest radiographic impression of mild congestive heart failure. Electronically Signed   By: Amie Portland M.D.   On: 01/05/2022 18:30     Sabino Dick, DO 01/05/2022, 10:58 PM PGY-2, [facility] FPTS Intern pager: [phone], text pages welcome

## 2022-01-05 NOTE — ED Notes (Signed)
Patient transported to CT 

## 2022-01-05 NOTE — ED Notes (Signed)
Pt in X ray

## 2022-01-05 NOTE — Progress Notes (Signed)
CPAP has been set up for patient's use, patient will self place when ready. ?

## 2022-01-06 ENCOUNTER — Other Ambulatory Visit (HOSPITAL_COMMUNITY): Payer: Medicare Other

## 2022-01-06 ENCOUNTER — Inpatient Hospital Stay (HOSPITAL_COMMUNITY): Payer: Medicare Other

## 2022-01-06 DIAGNOSIS — J440 Chronic obstructive pulmonary disease with acute lower respiratory infection: Secondary | ICD-10-CM | POA: Diagnosis not present

## 2022-01-06 DIAGNOSIS — I5041 Acute combined systolic (congestive) and diastolic (congestive) heart failure: Secondary | ICD-10-CM

## 2022-01-06 DIAGNOSIS — I6990 Unspecified sequelae of unspecified cerebrovascular disease: Secondary | ICD-10-CM

## 2022-01-06 DIAGNOSIS — I509 Heart failure, unspecified: Secondary | ICD-10-CM | POA: Diagnosis not present

## 2022-01-06 DIAGNOSIS — E118 Type 2 diabetes mellitus with unspecified complications: Secondary | ICD-10-CM | POA: Diagnosis not present

## 2022-01-06 DIAGNOSIS — Z6838 Body mass index (BMI) 38.0-38.9, adult: Secondary | ICD-10-CM

## 2022-01-06 DIAGNOSIS — J209 Acute bronchitis, unspecified: Secondary | ICD-10-CM

## 2022-01-06 DIAGNOSIS — G4733 Obstructive sleep apnea (adult) (pediatric): Secondary | ICD-10-CM

## 2022-01-06 LAB — BASIC METABOLIC PANEL
Anion gap: 8 (ref 5–15)
Anion gap: 9 (ref 5–15)
BUN: 12 mg/dL (ref 8–23)
BUN: 12 mg/dL (ref 8–23)
CO2: 27 mmol/L (ref 22–32)
CO2: 26 mmol/L (ref 22–32)
Calcium: 8.6 mg/dL — ABNORMAL LOW (ref 8.9–10.3)
Calcium: 8.6 mg/dL — ABNORMAL LOW (ref 8.9–10.3)
Chloride: 105 mmol/L (ref 98–111)
Chloride: 106 mmol/L (ref 98–111)
Creatinine, Ser: 1.28 mg/dL — ABNORMAL HIGH (ref 0.61–1.24)
Creatinine, Ser: 1.25 mg/dL — ABNORMAL HIGH (ref 0.61–1.24)
GFR, Estimated: 59 mL/min — ABNORMAL LOW (ref >60)
GFR, Estimated: >60 mL/min (ref >60)
Glucose, Bld: 181 mg/dL — ABNORMAL HIGH (ref 70–99)
Glucose, Bld: 115 mg/dL — ABNORMAL HIGH (ref 70–99)
Potassium: 3.2 mmol/L — ABNORMAL LOW (ref 3.5–5.1)
Potassium: 3.2 mmol/L — ABNORMAL LOW (ref 3.5–5.1)
Sodium: 140 mmol/L (ref 135–145)
Sodium: 141 mmol/L (ref 135–145)

## 2022-01-06 LAB — LIPID PANEL
Cholesterol: 124 mg/dL (ref 0–200)
HDL: 35 mg/dL — ABNORMAL LOW (ref >40)
LDL Cholesterol: 67 mg/dL (ref 0–99)
Total CHOL/HDL Ratio: 3.5 RATIO
Triglycerides: 111 mg/dL (ref <150)
VLDL: 22 mg/dL (ref 0–40)

## 2022-01-06 LAB — HEMOGLOBIN A1C
Hgb A1c MFr Bld: 5.2 % (ref 4.8–5.6)
Mean Plasma Glucose: 102.54 mg/dL

## 2022-01-06 LAB — GLUCOSE, CAPILLARY
Glucose-Capillary: 126 mg/dL — ABNORMAL HIGH (ref 70–99)
Glucose-Capillary: 142 mg/dL — ABNORMAL HIGH (ref 70–99)

## 2022-01-06 LAB — ECHOCARDIOGRAM COMPLETE
AR max vel: 2.46 cm2
AV Peak grad: 4.9 mmHg
Ao pk vel: 1.11 m/s
Area-P 1/2: 4.17 cm2
Height: 65 in
S' Lateral: 4.4 cm
Weight: 3721.36 oz

## 2022-01-06 LAB — TROPONIN I (HIGH SENSITIVITY)
Troponin I (High Sensitivity): 95 ng/L — ABNORMAL HIGH (ref <18)
Troponin I (High Sensitivity): 97 ng/L — ABNORMAL HIGH (ref <18)

## 2022-01-06 MED ORDER — POTASSIUM CHLORIDE CRYS ER 20 MEQ PO TBCR: 40.0000 meq | EXTENDED_RELEASE_TABLET | Freq: Two times a day (BID) | ORAL | Status: DC

## 2022-01-06 MED ORDER — POTASSIUM CHLORIDE CRYS ER 20 MEQ PO TBCR
40.0000 meq | EXTENDED_RELEASE_TABLET | Freq: Two times a day (BID) | ORAL | Status: AC
  Administered 2022-01-06 (×2): 40 meq via ORAL
  Filled 2022-01-06 (×2): qty 2

## 2022-01-06 MED ORDER — SPIRONOLACTONE 25 MG PO TABS
25.0000 mg | ORAL_TABLET | Freq: Every day | ORAL | Status: DC
  Administered 2022-01-06 – 2022-01-08 (×3): 25 mg via ORAL
  Filled 2022-01-06 (×3): qty 1

## 2022-01-06 MED ORDER — FUROSEMIDE 10 MG/ML IJ SOLN
20.0000 mg | Freq: Two times a day (BID) | INTRAMUSCULAR | Status: DC
  Administered 2022-01-07 – 2022-01-08 (×3): 20 mg via INTRAVENOUS
  Filled 2022-01-06 (×3): qty 2

## 2022-01-06 MED ORDER — PERFLUTREN LIPID MICROSPHERE
1.0000 mL | INTRAVENOUS | Status: AC | PRN
  Administered 2022-01-06: 4 mL via INTRAVENOUS
  Filled 2022-01-06: qty 10

## 2022-01-06 MED ORDER — HEPARIN (PORCINE) 25000 UT/250ML-% IV SOLN
1650.0000 [IU]/h | INTRAVENOUS | Status: DC
  Administered 2022-01-06: 1350 [IU]/h via INTRAVENOUS
  Administered 2022-01-07: 1650 [IU]/h via INTRAVENOUS
  Filled 2022-01-06 (×2): qty 250

## 2022-01-06 MED ORDER — ALBUTEROL SULFATE (2.5 MG/3ML) 0.083% IN NEBU
2.5000 mg | INHALATION_SOLUTION | RESPIRATORY_TRACT | Status: DC | PRN
  Administered 2022-01-06: 11:00:00 2.5 mg via RESPIRATORY_TRACT
  Filled 2022-01-06: qty 3

## 2022-01-06 MED ORDER — HEPARIN BOLUS VIA INFUSION
4000.0000 [IU] | Freq: Once | INTRAVENOUS | Status: AC
  Administered 2022-01-06: 4000 [IU] via INTRAVENOUS
  Filled 2022-01-06: qty 4000

## 2022-01-06 NOTE — Evaluation (Signed)
Physical Therapy Evaluation ?Patient Details ?Name: Raymond [name] ?MRN: [id number] ?DOB: 04-26-1951 ?Today's Date: 01/06/2022 ? ?History of Present Illness ? The pt is a 71 yo male presenting 3/4 with cough x2 days. upon work up, concern for new onset CHF. PMH includes: CVA with R-sided deficits, HTN, HLD, DM II, OSA on CPAP, and depression. ?  ?Clinical Impression ? Pt in bed upon arrival of PT, agreeable to evaluation at this time. Prior to admission the pt was ambulating without use of AD, driving, and independent with ADLs and IADLs. The pt now presents with limitations in functional mobility, power, endurance, and dynamic stability due to above dx, and will continue to benefit from skilled PT to address these deficits. The pt was able to complete sit-stand transfers with minG for safety but no assist to rise or steady, and completed x2 bouts of 50 ft ambulation in the hall without need for AD. The pt does use slowed gait with slightly increased lateral sway, but had no overt LOB at this time and VSS on RA. Will continue to benefit from skilled PT acutely to progress endurance and challenge dynamic stability. Will be safe to return home with family support when medically stable.  ?  ?   ? ?Recommendations for follow up therapy are one component of a multi-disciplinary discharge planning process, led by the attending physician.  Recommendations may be updated based on patient status, additional functional criteria and insurance authorization. ? ?Follow Up Recommendations Home health PT ? ?  ?Assistance Recommended at Discharge Intermittent Supervision/Assistance  ?Patient can return home with the following ? A little help with walking and/or transfers;A little help with bathing/dressing/bathroom;Assistance with cooking/housework;Help with stairs or ramp for entrance ? ?  ?Equipment Recommendations BSC/3in1  ?Recommendations for Other Services ?    ?  ?Functional Status Assessment Patient has had a recent decline in  their functional status and demonstrates the ability to make significant improvements in function in a reasonable and predictable amount of time.  ? ?  ?Precautions / Restrictions Precautions ?Precautions: Fall ?Restrictions ?Weight Bearing Restrictions: No  ? ?  ? ?Mobility ? Bed Mobility ?Overal bed mobility: Needs Assistance ?Bed Mobility: Supine to Sit, Sit to Supine ?  ?  ?Supine to sit: Min guard ?Sit to supine: Min assist ?  ?General bed mobility comments: increased time and effort, HOB elevated ?  ? ?Transfers ?Overall transfer level: Needs assistance ?Equipment used: None ?Transfers: Sit to/from Stand ?Sit to Stand: Min guard ?  ?  ?  ?  ?  ?General transfer comment: minG for safety, no overt LOB in standing ?  ? ?Ambulation/Gait ?Ambulation/Gait assistance: Min guard ?Gait Distance (Feet): 50 Feet (+ 50 ft) ?Assistive device: None ?Gait Pattern/deviations: Step-through pattern, Decreased stride length ?Gait velocity: decreased ?Gait velocity interpretation: <1.31 ft/sec, indicative of household ambulator ?  ?General Gait Details: slow but steady, no overt LOB and VSS on RA. pt incontinent of urine while walking and did not alert staff or therapist that he was urinating ? ?  ? ?Balance Overall balance assessment: Needs assistance ?Sitting-balance support: No upper extremity supported ?Sitting balance-Leahy Scale: Fair ?Sitting balance - Comments: limited ability to lean outside BOS ?  ?Standing balance support: No upper extremity supported, During functional activity ?Standing balance-Leahy Scale: Fair ?Standing balance comment: unable to tolerate challenge ?  ?  ?  ?  ?  ?  ?  ?  ?  ?  ?  ?   ? ? ? ?Pertinent Vitals/Pain  Pain Assessment ?Pain Assessment: No/denies pain  ? ? ?Home Living Family/patient expects to be discharged to:: Private residence ?Living Arrangements: Spouse/significant other ?Available Help at Discharge: Family;Available PRN/intermittently ?Type of Home: House ?Home Access: Stairs to  enter ?Entrance Stairs-Rails: Left;Right ?Entrance Stairs-Number of Steps: 3 ?  ?Home Layout: One level ?Home Equipment: Gilmer Mor - single point ?Additional Comments: pt family has made him walking sticks, states no limitations in mobility other than endurance  ?  ?Prior Function Prior Level of Function : Driving;Independent/Modified Independent ?  ?  ?  ?  ?  ?  ?Mobility Comments: walking without AD, endurance difficulties. no falls ?ADLs Comments: unable to don socks/shoes ?  ? ? ?Hand Dominance  ?   ? ?  ?Extremity/Trunk Assessment  ? Upper Extremity Assessment ?Upper Extremity Assessment: Overall WFL for tasks assessed ?  ? ?Lower Extremity Assessment ?Lower Extremity Assessment: Generalized weakness ?  ? ?Cervical / Trunk Assessment ?Cervical / Trunk Assessment: Other exceptions ?Cervical / Trunk Exceptions: large body habitus  ?Communication  ? Communication: No difficulties  ?Cognition Arousal/Alertness: Awake/alert ?Behavior During Therapy: [facility] for tasks assessed/performed ?Overall Cognitive Status: Impaired/Different from baseline ?Area of Impairment: Awareness, Problem solving, Safety/judgement ?  ?  ?  ?  ?  ?  ?  ?  ?  ?  ?  ?  ?Safety/Judgement: Decreased awareness of deficits ?Awareness: Intellectual ?Problem Solving: Decreased initiation, Requires verbal cues ?General Comments: pt with deficits such as urinary incontinence that he did not mention until after incontinence episode in hallway. did not verbalize need to use bathroom despite multiple offers, poor medical compliance ?  ?  ? ?  ?General Comments General comments (skin integrity, edema, etc.): VSS on RA ? ?  ?Exercises    ? ?Assessment/Plan  ?  ?PT Assessment Patient needs continued PT services  ?PT Problem List Decreased strength;Decreased activity tolerance;Decreased balance;Decreased mobility ? ?   ?  ?PT Treatment Interventions DME instruction;Gait training;Stair training;Functional mobility training;Therapeutic activities;Therapeutic  exercise;Balance training;Patient/family education   ? ?PT Goals (Current goals can be found in the Care Plan section)  ?Acute Rehab PT Goals ?Patient Stated Goal: return home ?PT Goal Formulation: With patient/family ?Time For Goal Achievement: 01/20/22 ?Potential to Achieve Goals: Good ? ?  ?Frequency Min 3X/week ?  ? ? ?   ?AM-PAC PT "6 Clicks" Mobility  ?Outcome Measure Help needed turning from your back to your side while in a flat bed without using bedrails?: A Little ?Help needed moving from lying on your back to sitting on the side of a flat bed without using bedrails?: A Lot ?Help needed moving to and from a bed to a chair (including a wheelchair)?: A Little ?Help needed standing up from a chair using your arms (e.g., wheelchair or bedside chair)?: A Little ?Help needed to walk in hospital room?: A Little ?Help needed climbing 3-5 steps with a railing? : A Little ?6 Click Score: 17 ? ?  ?End of Session Equipment Utilized During Treatment: Gait belt ?Activity Tolerance: Patient tolerated treatment well ?Patient left: in bed;with call bell/phone within reach;with bed alarm set ?Nurse Communication: Mobility status ?PT Visit Diagnosis: Unsteadiness on feet (R26.81) ?  ? ?Time: 1696-7893 ?PT Time Calculation (min) (ACUTE ONLY): 30 min ? ? ?Charges:   PT Evaluation ?$PT Eval Low Complexity: 1 Low ?PT Treatments ?$Therapeutic Exercise: 8-22 mins ?  ?   ? ? ?Vickki Muff, PT, DPT  ? ?Acute Rehabilitation Department ?Pager #: [phone] ? ?Ronnie Derby ?  01/06/2022, 5:14 PM ? ?

## 2022-01-06 NOTE — TOC Progression Note (Signed)
Transition of Care (TOC) - Initial/Assessment Note  ? ? ?Patient Details  ?Name: Raymond [name] ?MRN: [id number] ?Date of Birth: Sep 29, 1951 ? ?Transition of Care (TOC) CM/SW Contact:    ?Paulene Floor Kourtnei Rauber, LCSWA ?Phone Number: ?01/06/2022, 10:15 AM ? ?Clinical Narrative:                 ?CSW received and acknowledged consult for possible SNF placement.  ? ?Pending:  PT/OT assessments and recommendations. ? ?TOC will continue to follow.  ? ?  ?  ? ? ?Patient Goals and CMS Choice ?  ?  ?  ? ?Expected Discharge Plan and Services ?  ?  ?  ?  ?  ?                ?  ?  ?  ?  ?  ?  ?  ?  ?  ?  ? ?Prior Living Arrangements/Services ?  ?  ?  ?       ?  ?  ?  ?  ? ?Activities of Daily Living ?Home Assistive Devices/Equipment: CPAP, CBG Meter, Cane (specify quad or straight) ?ADL Screening (condition at time of admission) ?Patient's cognitive ability adequate to safely complete daily activities?: Yes ?Is the patient deaf or have difficulty hearing?: Yes ?Does the patient have difficulty seeing, even when wearing glasses/contacts?: No ?Does the patient have difficulty concentrating, remembering, or making decisions?: No ?Patient able to express need for assistance with ADLs?: Yes ?Does the patient have difficulty dressing or bathing?: No ?Independently performs ADLs?: Yes (appropriate for developmental age) ?Does the patient have difficulty walking or climbing stairs?: Yes ?Weakness of Legs: Right ?Weakness of Arms/Hands: Right ? ?Permission Sought/Granted ?  ?  ?   ?   ?   ?   ? ?Emotional Assessment ?  ?  ?  ?  ?  ?  ? ?Admission diagnosis:  Congestive heart failure (CHF) ([place]) [I50.9] ?Congestive heart failure, unspecified HF chronicity, unspecified heart failure type ([place]) [I50.9] ?Patient Active Problem List  ? Diagnosis Date Noted  ? Congestive heart failure (CHF) ([place]) 01/05/2022  ? Sensorineural hearing loss (SNHL) of both ears 07/31/2017  ? Episodic cluster headache, not intractable 07/31/2017  ? Right hemiparesis ([place])  07/10/2017  ? History of stroke 07/10/2017  ? Depression 06/22/2017  ? Diabetes mellitus type 2 in obese ([place]) 06/22/2017  ? Inner ear dysfunction 06/22/2017  ? Essential hypertension 06/22/2017  ? OSA on CPAP 06/22/2017  ? Expressive aphasia 06/22/2017  ? Stroke ([place]) 06/21/2017  ? Cryptogenic stroke ([place]) 06/21/2017  ? Major depressive disorder, recurrent ([place]) 10/21/2012  ? ?PCP:  Charleston Poot, MD ?Pharmacy:   ?[place], [place] - [address] ?[place] ?[place] [address] ?Phone: [phone] Fax: [phone] ? ? ? ? ?Social Determinants of Health (SDOH) Interventions ?  ? ?Readmission Risk Interventions ?No flowsheet data found. ? ? ?

## 2022-01-06 NOTE — Consult Note (Signed)
Cardiology Consultation:   Patient ID: Raymond [name] MRN: [id number]; DOB: [birth year]  Admit date: 01/05/2022 Date of Consult: 01/06/2022  PCP:  Forrest Moron, MD   [facility] Providers Cardiologist:  None        Patient Profile:   Raymond [name] is a 71 y.o. male with a hx of multiple coronary risk factors who is being seen 01/06/2022 for the evaluation of CHF at the request of Dr. Leveda Anna.  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  His daughter is a Physicist, medical.  Roughly 6 days before this admission he experienced a bad case of "indigestion" and jaw pain.  2 days later he began experiencing exertional dyspnea with minimal activity and eventually had orthopnea.  He had a couple more episodes of jaw discomfort off and on.  He last had some jaw discomfort last night, but is currently pain-free.  His breathing has improved with diuretics.  He was able to walk to the nursing station and back although this made him feel exhausted.  He still has some orthopnea.  Admission chest x-ray was consistent with pulmonary edema and his BNP was greater than 1000.  High-sensitivity troponin was borderline abnormal at about 35 on admission and crept up to the mid 90s the next day  Preliminary review of his echocardiogram performed earlier today shows moderately depressed left ventricular systolic function with an ejection fraction probably around 35% and with a severe wall motion abnormality involving the distribution of the LAD artery, with akinesis/mild dyskinesis at the level of the anterior apex.  In some images there is concern for a small mural intraventricular thrombus and we are going to repeat the evaluation with Definity contrast.  Doppler velocities suggest moderate diastolic dysfunction with elevated left atrial pressure.  Past medical history significant for a stroke in 2018 that reportedly has affected mostly his hearing and is  causing some tremor in his right arm.  Interestingly, a TEE performed at that time did not show a cause of stroke but did "LVEF of 45-50% although without wall motion abnormalities the transthoracic echo reported EF 60-65%).  He received a loop recorder that did not show evidence of atrial fibrillation during 3 years of follow-up (the device has reached end of service).  He has hypertension (not controlled at the time of admission), hyperlipidemia on statin excellent LDL at 67, but also with low HDL at 35), type 2 diabetes mellitus with excellent glycemic control (hemoglobin A1c 5.2%), obesity and obstructive sleep apnea.  He does not smoke cigarettes but does chew on a cigar and occasionally lights it.   Past Medical History:  Diagnosis Date   Arthritis    Depression    Diabetes mellitus type 2 in obese (HCC)    Hypertension    Migraines    Sleep apnea    Stroke [facility])     Past Surgical History:  Procedure Laterality Date   CHOLECYSTECTOMY     KNEE ARTHROSCOPY     LOOP RECORDER INSERTION N/A 06/24/2017   Procedure: LOOP RECORDER INSERTION;  Surgeon: Hillis Range, MD;  Location: [facility] INVASIVE CV LAB;  Service: Cardiovascular;  Laterality: N/A;   ROTATOR CUFF REPAIR     TEE WITHOUT CARDIOVERSION N/A 06/24/2017   Procedure: TRANSESOPHAGEAL ECHOCARDIOGRAM (TEE) WITH LOOP;  Surgeon: Elease Hashimoto Deloris Ping, MD;  Location: [facility] ENDOSCOPY;  Service: Cardiovascular;  Laterality: N/A;     Home Medications:  Prior to Admission medications  Medication Sig Start Date End Date Taking? Authorizing Provider  acetaminophen (TYLENOL) 500 MG tablet Take 2 tablets (1,000 mg total) by mouth every 6 (six) hours as needed. Patient taking differently: Take 1,000 mg by mouth every 6 (six) hours as needed for mild pain. 04/23/16  Yes Arby Barrette, MD  aspirin 325 MG tablet Take 325 mg by mouth daily.   Yes [provider]  atorvastatin (LIPITOR) 20 MG tablet Take 1 tablet (20 mg total) by mouth daily at 6  PM. 06/24/17  Yes Sheikh, Omair Latif, DO  Dextromethorphan-guaiFENesin (MUCUS RELIEF DM MAX PO) Take 20 mLs by mouth every 4 (four) hours as needed (cough).   Yes [provider]  doxazosin (CARDURA) 8 MG tablet Take 12 mg by mouth at bedtime.   Yes [provider]  doxycycline (VIBRAMYCIN) 100 MG capsule Take 100 mg by mouth See admin instructions. Bid x 7 days   Yes [provider]  ibuprofen (ADVIL) 200 MG tablet Take 400 mg by mouth every 6 (six) hours as needed for headache or moderate pain.   Yes [provider]  levocetirizine (XYZAL) 5 MG tablet Take 5 mg by mouth daily as needed for allergies. 06/02/17  Yes [provider]  losartan (COZAAR) 100 MG tablet Take 100 mg by mouth daily. 05/28/19  Yes [provider]  metFORMIN (GLUCOPHAGE) 500 MG tablet Take 1 tablet (500 mg total) by mouth at bedtime. Patient taking differently: Take 500 mg by mouth 2 (two) times daily with a meal. 07/13/17  Yes Mikhail, Nita Sells, DO  sertraline (ZOLOFT) 100 MG tablet Take 100 mg by mouth daily.  07/17/17  Yes [provider]    Inpatient Medications: Scheduled Meds:  aspirin  325 mg Oral Daily   atorvastatin  20 mg Oral q1800   doxazosin  12 mg Oral QHS   enoxaparin (LOVENOX) injection  40 mg Subcutaneous Q24H   losartan  100 mg Oral Daily   potassium chloride  40 mEq Oral BID   sertraline  100 mg Oral Daily   spironolactone  25 mg Oral Daily   Continuous Infusions:  PRN Meds: acetaminophen **OR** acetaminophen, albuterol, perflutren lipid microspheres (DEFINITY) IV suspension, polyethylene glycol  Allergies:    Allergies  Allergen Reactions   Flomax [Tamsulosin Hcl] Other (See Comments)    HYPOtension and syncope    Social History:   Social History   Socioeconomic History   Marital status: Married    Spouse name: Not on file   Number of children: Not on file   Years of education: Not on file   Highest education level: Not on  file  Occupational History   Not on file  Tobacco Use   Smoking status: Light Smoker    Types: Cigars   Smokeless tobacco: Never   Tobacco comments:    chew on cigars its not lit  Substance and Sexual Activity   Alcohol use: No    Alcohol/week: 0.0 standard drinks   Drug use: No   Sexual activity: Not Currently  Other Topics Concern   Not on file  Social History Narrative   Not on file   Social Determinants of Health   Financial Resource Strain: Not on file  Food Insecurity: Not on file  Transportation Needs: Not on file  Physical Activity: Not on file  Stress: Not on file  Social Connections: Not on file  Intimate Partner Violence: Not on file    Family History:    Family History  Problem Relation Age of Onset   Other Mother        reports in good health   Other Father        was estranged from family, unknown medical history     ROS:  Please see the history of present illness.   All other ROS reviewed and negative.     Physical Exam/Data:   Vitals:   01/06/22 0600 01/06/22 0747 01/06/22 1051 01/06/22 1112  BP: (!) 157/75 (!) 172/71 (!) 154/81   Pulse: 62 66 69   Resp: 18 20 18    Temp: 98.4 F (36.9 C) 98.4 F (36.9 C) 97.9 F (36.6 C)   TempSrc: Oral Oral Oral   SpO2: 97% 97% 98% 100%  Weight:      Height:        Intake/Output Summary (Last 24 hours) at 01/06/2022 1722 Last data filed at 01/06/2022 1320 Gross per 24 hour  Intake 760 ml  Output 1900 ml  Net -1140 ml   Last 3 Weights 01/06/2022 01/05/2022 01/05/2022  Weight (lbs) 232 lb 9.4 oz 231 lb 11.2 oz 230 lb  Weight (kg) 105.5 kg 105.098 kg 104.327 kg  Some encounter information is confidential and restricted. Go to Review Flowsheets activity to see all data.     Body mass index is 38.7 kg/m.  General:  Well nourished, well developed, in no acute distress; severely obese HEENT: normal Neck: no JVD Vascular: No carotid bruits; Distal pulses 2+ bilaterally in both upper and lower  extremities Cardiac:  normal S1, S2; RRR; no murmur  Lungs:  clear to auscultation bilaterally, no wheezing, rhonchi or rales  Abd: soft, nontender, no hepatomegaly  Ext: no edema Musculoskeletal:  No deformities, BUE and BLE strength normal and equal Skin: warm and dry  Neuro:  CNs 2-12 intact, no focal abnormalities noted Psych:  Normal affect   EKG:  The EKG was personally reviewed and demonstrates: Sinus rhythm with diminished R wave voltage in V1-V4, but without acute ST segment changes Telemetry:  Telemetry was personally reviewed and demonstrates: Sinus rhythm, rare PVCs  Relevant CV Studies: Preliminary review of echocardiogram shows moderately depressed left ventricular systolic function with ejection fraction around 35% and regional wall motion abnormalities in the LAD artery distribution, concern for possible LV apical thrombus  Laboratory Data:  High Sensitivity Troponin:   Recent Labs  Lab 01/05/22 1627 01/05/22 1758 01/06/22 0146 01/06/22 0437  TROPONINIHS 34* 38* 95* 97*     Chemistry Recent Labs  Lab 01/05/22 1627 01/06/22 0146 01/06/22 0505  NA 140 140 141  K 3.7 3.2* 3.2*  CL 107 105 106  CO2 25 27 26   GLUCOSE 146* 181* 115*  BUN 13 12 12   CREATININE 1.24 1.28* 1.25*  CALCIUM 8.7* 8.6* 8.6*  GFRNONAA >60 59* >60  ANIONGAP 8 8 9     No results for input(s): PROT, ALBUMIN, AST, ALT, ALKPHOS, BILITOT in the last 168 hours. Lipids  Recent Labs  Lab 01/06/22 0146  CHOL 124  TRIG 111  HDL 35*  LDLCALC 67  CHOLHDL 3.5    Hematology Recent Labs  Lab 01/05/22 1627  WBC 5.7  RBC 4.71  HGB 14.3  HCT 41.1  MCV 87.3  MCH 30.4  MCHC 34.8  RDW 13.5  PLT 166   Thyroid No results for input(s): TSH, FREET4 in the last 168 hours.  BNP Recent Labs  Lab 01/05/22 1748  BNP 1,096.9*    DDimer No results for input(s): DDIMER in the  last 168 hours.   Radiology/Studies:  DG Chest 2 View  Result Date: 01/05/2022 CLINICAL DATA:  chest pain EXAM:  CHEST - 2 VIEW COMPARISON:  October 18, 2020 FINDINGS: The cardiomediastinal silhouette is unchanged and enlarged in contour.Cardiac loop recorder. Trace bilateral pleural effusions. No pneumothorax. Perihilar vascular congestion with diffuse interstitial markings. Visualized abdomen is unremarkable. Multilevel degenerative changes of the thoracic spine. IMPRESSION: Constellation of findings are favored to reflect pulmonary edema with trace bilateral pleural effusions. Differential considerations include atypical infection. Electronically Signed   By: Meda Klinefelter M.D.   On: 01/05/2022 16:56   CT Angio Chest PE W and/or Wo Contrast  Result Date: 01/05/2022 CLINICAL DATA:  Nonproductive cough for 2 days. Cough worse today with shortness of breath. EXAM: CT ANGIOGRAPHY CHEST WITH CONTRAST TECHNIQUE: Multidetector CT imaging of the chest was performed using the standard protocol during bolus administration of intravenous contrast. Multiplanar CT image reconstructions and MIPs were obtained to evaluate the vascular anatomy. RADIATION DOSE REDUCTION: This exam was performed according to the departmental dose-optimization program which includes automated exposure control, adjustment of the mA and/or kV according to patient size and/or use of iterative reconstruction technique. CONTRAST:  OMNIPAQUE IOHEXOL 350 MG/ML SOLN COMPARISON:  Current and prior chest radiographs. FINDINGS: Cardiovascular: Pulmonary arteries are well opacified. There is no evidence of a pulmonary embolism. Heart is mildly enlarged. Trace pericardial effusion. Mild left and circumflex coronary artery calcifications. Aorta is not opacified. Is normal in caliber. No atherosclerotic calcifications. Mediastinum/Nodes: No neck base, mediastinal or hilar masses or enlarged lymph nodes. Trachea and esophagus are unremarkable. Lungs/Pleura: Small pleural effusions. Mild interstitial thickening. Mild dependent atelectasis in the lower lobes at  the posterior lung bases. Small calcified granuloma in the left upper lobe near the apex. No convincing pneumonia. No pneumothorax. Upper Abdomen: No acute abnormality. Musculoskeletal: No fracture or bone lesion.  No chest wall masses. Review of the MIP images confirms the above findings. IMPRESSION: 1. No evidence of a pulmonary embolism. 2. No convincing pneumonia. 3. Cardiomegaly, small effusions and mild interstitial thickening supports the chest radiographic impression of mild congestive heart failure. Electronically Signed   By: Amie Portland M.D.   On: 01/05/2022 18:30     Assessment and Plan:   NSTEMI: Suspect that Mr. Lawrence has actually suffered an ST segment elevation myocardial infarction with extensive injury in the distribution of the LAD artery as seen on the echocardiogram.  This probably occurred almost a week ago.  He still has postinfarction angina and will benefit from coronary angiography and possible revascularization.  Cardiac catheterization once he is able to lie fully flat for this procedure.  Gust the possibility that he will receive revascularization with angioplasty-stent, but also the possibility that he has widespread CAD that we will need to an indication for bypass surgery.  I have tentatively added him to the list for Monday afternoon, but he may not be ready for the procedure by then; this will have to be reevaluated in the morning.   CHF: Secondary to ischemic cardiomyopathy.  Still has symptoms of hypervolemia.  Discussed that after revascularization is completed, treatment will consist of a complicated regimen that includes beta-blockers/Entresto/SGLT2 inhibitor/aldosterone antagonist in addition to loop diuretics as needed for symptom relief.  Very high dose of alpha-blocker (doxazosin) that could have deleterious effects and heart failure.  We will have to gradually get him off this. HLP: On current statin dose he has an excellent LDL, but his HDL is chronically low.  This is unlikely to improve without weight loss and physical exercise. DM: Excellent control on metformin monotherapy Obesity: Severe (BMI almost 39) with multiple comorbid conditions. OSA: Has excellent compliance with CPAP and denies daytime hypersomnolence. Prostatism: He is on a very high dose of doxazosin which will be counterproductive.  It may prevent us from using more important medications such as carvedilol or Entresto and can worsen HF outcomes.  Reportedly has a history of orthostatic hypotension and syncope with Flomax.  Would recommend transitioning to one of the newer generations alpha blockers such as silodosin or alfuzosin.   Risk Assessment/Risk Scores:     TIMI Risk Score for Unstable Angina or Non-ST Elevation MI:   The patient's TIMI risk score is 5, which indicates a 26% risk of all cause mortality, new or recurrent myocardial infarction or need for urgent revascularization in the next 14 days.  New York Heart Association (NYHA) Functional Class NYHA Class IV        For questions or updates, please contact [facility] Please consult [email or web address] for contact info under    Signed, Thurmon FairMihai Kenadee Gates, MD  01/06/2022 5:22 PM

## 2022-01-06 NOTE — Progress Notes (Signed)
Pt's Troponin went up to 95, no s/s, MD ordered another Troponin stat.  Will continue to monitor, Thanks, Raymond [name]  ?

## 2022-01-06 NOTE — Progress Notes (Signed)
Echocardiogram ?2D Echocardiogram has been performed. ? ?Eduard Roux ?01/06/2022, 2:54 PM ?

## 2022-01-06 NOTE — Progress Notes (Addendum)
Family Medicine Teaching Service ?Daily Progress Note ?Intern Pager: [phone] ? ?Patient name: Raymond [name] Medical record number: [id number] ?Date of birth: 1951-09-10 Age: 71 y.o. Gender: male ? ?Primary Care Provider: Forrest Moron, MD ?Consultants: None ?Code Status: FULL CODE  ? ?Pt Overview and Major Events to Date:  ?Admitted: 3/4 ? ?Assessment and Plan: ?DANN GALICIA is a 71 y.o. male presenting with cough, shortness of breath, orthopnea found to be in acute HF exacerbation. PMH is significant for HTN, T2DM, HLD, OSA, depression. ? ?Acute HF Exacerbation ?Received a total of 20 mg IV Lasix x2 with good UOP. Weight today 232 lbs, up 1 lb since yesterday. I/O net negative 310 mL since arriving to floor. Creatinine 1.24>1.28, potassium 3.2. Reports breathing is improved.  ?-F/u Echo  ?-Consult cardiology pending echo  ?-Redose IV Lasix 20 mg ?-Continue Losartan  ?-Start spironolactone 25 mg daily ?-Hold initiation of B-blocker in acute HF until adequately diuresed ?-Continue strict I/O, daily weights ? ?Hypokalemia ?K 3.2. Likely from diuresis.  ?-40 mEq K-Dur x2 doses  ?-Monitor on BMP daily  ? ?Elevated Troponin ?34>38>95>97. Likely demand ischemia in the setting of HF exacerbation. No chest pain. Trended flat ?-Monitor symptoms ?-Continue cardiac monitoring  ? ?Hypertension: Chronic ?BP ranged 168-191/75-109 overnight. Most recently 167/85 . Asymtomatic. Continue diuresis.  ?-Vitals per floor protocol ?-Continue cardiac monitoring ?-Continue Losartan 100 mg ?-Start spironolactone 25 mg daily ? ?Hyperlipidemia  Hx CVA 2018 ?Lipid panel notable for LDL 67, Trig 111, total cholesterol 124.  Has history of CVA, thus goal LDL <70. ?-Continue Atorvastatin 20 mg ?-Consider switch to high-intensity statin outpatient given hx CVA and T2DM ? ?Type 2 diabetes mellitus ?Hemoglobin A1c 5.2.  Holding metformin and CBG checks as patient is at goal. ?-Monitor glucose on BMP ?-Carb modified/heart healthy diet ?-Consider  starting SGLT-2 for cardiac and renal protection  ? ?OSA: Chronic, stable ?-CPAP nightly  ? ?Depression: Chronic, Stable ?-Continue Zoloft 100 mg daily  ? ? ?FEN/GI: Carb modified/Heart Healthy diet  ?PPx: Lovenox  ?Dispo:Home pending clinical improvement . Barriers include IV diuresis.  ? ?Subjective:  ?Patient is accompanied by his wife at the bedside this morning. He reports that his breathing is starting to improve, but he is still coughing intermittently. He denies any chest pain, abdominal pain. Last BM yesterday.  ? ?Objective: ?Temp:  [97.9 ?F (36.6 ?C)-99.2 ?F (37.3 ?C)] 99.2 ?F (37.3 ?C) (03/05 0000) ?Pulse Rate:  [64-95] 66 (03/05 0000) ?Resp:  [19-24] 19 (03/05 0000) ?BP: (168-197)/(75-109) 168/75 (03/05 0000) ?SpO2:  [94 %-100 %] 94 % (03/05 0000) ?Weight:  [104.3 kg-105.5 kg] 105.5 kg (03/05 0015) ?Physical Exam: ?General: Awake, alert, in NAD, pleasant, on CPAP ?Cardiovascular: RRR without murmur ?Respiratory: Was on CPAP (removed during encounter), crackles at b/l bases ?Abdomen: Soft, obese, non-tender in all quadrants, no R/G, normoactive bowel sounds ?Extremities: No edema, 2+ radial, PT and DP pulses b/l ? ?Laboratory: ?Recent Labs  ?Lab 01/05/22 ?1627  ?WBC 5.7  ?HGB 14.3  ?HCT 41.1  ?PLT 166  ? ?Recent Labs  ?Lab 01/05/22 ?1627 01/06/22 ?0146 01/06/22 ?0505  ?NA 140 140 141  ?K 3.7 3.2* 3.2*  ?CL 107 105 106  ?CO2 25 27 26   ?BUN 13 12 12   ?CREATININE 1.24 1.28* 1.25*  ?CALCIUM 8.7* 8.6* 8.6*  ?GLUCOSE 146* 181* 115*  ? ? ?Imaging/Diagnostic Tests: ?DG Chest 2 View ? ?Result Date: 01/05/2022 ?CLINICAL DATA:  chest pain EXAM: CHEST - 2 VIEW COMPARISON:  October 18, 2020 FINDINGS: The  cardiomediastinal silhouette is unchanged and enlarged in contour.Cardiac loop recorder. Trace bilateral pleural effusions. No pneumothorax. Perihilar vascular congestion with diffuse interstitial markings. Visualized abdomen is unremarkable. Multilevel degenerative changes of the thoracic spine. IMPRESSION:  Constellation of findings are favored to reflect pulmonary edema with trace bilateral pleural effusions. Differential considerations include atypical infection. Electronically Signed   By: Meda Klinefelter M.D.   On: 01/05/2022 16:56  ? ?CT Angio Chest PE W and/or Wo Contrast ? ?Result Date: 01/05/2022 ?CLINICAL DATA:  Nonproductive cough for 2 days. Cough worse today with shortness of breath. EXAM: CT ANGIOGRAPHY CHEST WITH CONTRAST TECHNIQUE: Multidetector CT imaging of the chest was performed using the standard protocol during bolus administration of intravenous contrast. Multiplanar CT image reconstructions and MIPs were obtained to evaluate the vascular anatomy. RADIATION DOSE REDUCTION: This exam was performed according to the departmental dose-optimization program which includes automated exposure control, adjustment of the mA and/or kV according to patient size and/or use of iterative reconstruction technique. CONTRAST:  OMNIPAQUE IOHEXOL 350 MG/ML SOLN COMPARISON:  Current and prior chest radiographs. FINDINGS: Cardiovascular: Pulmonary arteries are well opacified. There is no evidence of a pulmonary embolism. Heart is mildly enlarged. Trace pericardial effusion. Mild left and circumflex coronary artery calcifications. Aorta is not opacified. Is normal in caliber. No atherosclerotic calcifications. Mediastinum/Nodes: No neck base, mediastinal or hilar masses or enlarged lymph nodes. Trachea and esophagus are unremarkable. Lungs/Pleura: Small pleural effusions. Mild interstitial thickening. Mild dependent atelectasis in the lower lobes at the posterior lung bases. Small calcified granuloma in the left upper lobe near the apex. No convincing pneumonia. No pneumothorax. Upper Abdomen: No acute abnormality. Musculoskeletal: No fracture or bone lesion.  No chest wall masses. Review of the MIP images confirms the above findings. IMPRESSION: 1. No evidence of a pulmonary embolism. 2. No convincing pneumonia.  3. Cardiomegaly, small effusions and mild interstitial thickening supports the chest radiographic impression of mild congestive heart failure. Electronically Signed   By: Amie Portland M.D.   On: 01/05/2022 18:30   ? ? ?Sabino Dick, DO ?01/06/2022, 6:12 AM ?PGY-2, [facility] ?FPTS Intern pager: [phone], text pages welcome ? ?

## 2022-01-06 NOTE — Progress Notes (Signed)
ANTICOAGULATION CONSULT NOTE - Initial Consult ? ?Pharmacy Consult for heparin ?Indication:  ventricular thrombus ? ?Allergies  ?Allergen Reactions  ? Flomax [Tamsulosin Hcl] Other (See Comments)  ?  HYPOtension and syncope  ? ? ?Patient Measurements: ?Height: 5\' 5"  (165.1 cm) ?Weight: 105.5 kg (232 lb 9.4 oz) ?IBW/kg (Calculated) : 61.5 ?Heparin Dosing Weight: 85kg ? ?Vital Signs: ?Temp: 97.9 ?F (36.6 ?C) (03/05 1051) ?Temp Source: Oral (03/05 1051) ?BP: 154/81 (03/05 1051) ?Pulse Rate: 69 (03/05 1051) ? ?Labs: ?Recent Labs  ?  01/05/22 ?1627 01/05/22 ?1758 01/06/22 ?0146 01/06/22 ?[id number] 01/06/22 ?0505  ?HGB 14.3  --   --   --   --   ?HCT 41.1  --   --   --   --   ?PLT 166  --   --   --   --   ?CREATININE 1.24  --  1.28*  --  1.25*  ?TROPONINIHS 34* 38* 95* 97*  --   ? ? ?Estimated Creatinine Clearance: 59.8 mL/min (A) (by C-G formula based on SCr of 1.25 mg/dL (H)). ? ? ?Medical History: ?Past Medical History:  ?Diagnosis Date  ? Arthritis   ? Depression   ? Diabetes mellitus type 2 in obese [facility])   ? Hypertension   ? Migraines   ? Sleep apnea   ? Stroke [facility])   ? ? ?Medications:  ?Medications Prior to Admission  ?Medication Sig Dispense Refill Last Dose  ? acetaminophen (TYLENOL) 500 MG tablet Take 2 tablets (1,000 mg total) by mouth every 6 (six) hours as needed. (Patient taking differently: Take 1,000 mg by mouth every 6 (six) hours as needed for mild pain.) 30 tablet 0 Past Month  ? aspirin 325 MG tablet Take 325 mg by mouth daily.   01/05/2022  ? atorvastatin (LIPITOR) 20 MG tablet Take 1 tablet (20 mg total) by mouth daily at 6 PM. 30 tablet 0 01/05/2022  ? Dextromethorphan-guaiFENesin (MUCUS RELIEF DM MAX PO) Take 20 mLs by mouth every 4 (four) hours as needed (cough).   01/04/2022  ? doxazosin (CARDURA) 8 MG tablet Take 12 mg by mouth at bedtime.   01/04/2022  ? doxycycline (VIBRAMYCIN) 100 MG capsule Take 100 mg by mouth See admin instructions. Bid x 7 days   01/05/2022  ? ibuprofen (ADVIL) 200 MG tablet Take 400 mg  by mouth every 6 (six) hours as needed for headache or moderate pain.   Past Month  ? levocetirizine (XYZAL) 5 MG tablet Take 5 mg by mouth daily as needed for allergies.   Past Month  ? losartan (COZAAR) 100 MG tablet Take 100 mg by mouth daily.   01/05/2022  ? metFORMIN (GLUCOPHAGE) 500 MG tablet Take 1 tablet (500 mg total) by mouth at bedtime. (Patient taking differently: Take 500 mg by mouth 2 (two) times daily with a meal.)   01/05/2022  ? sertraline (ZOLOFT) 100 MG tablet Take 100 mg by mouth daily.    01/05/2022  ? ?Scheduled:  ? aspirin  325 mg Oral Daily  ? atorvastatin  20 mg Oral q1800  ? doxazosin  12 mg Oral QHS  ? heparin  4,000 Units Intravenous Once  ? losartan  100 mg Oral Daily  ? potassium chloride  40 mEq Oral BID  ? sertraline  100 mg Oral Daily  ? spironolactone  25 mg Oral Daily  ? ?Infusions:  ? heparin    ? ? ?Assessment: ?Pt was admitted for short of breath with concern for CHF exacerbation. ECHO showed  left ventricular thrombus. IV heparin ordered. May need cath. ? ?Scr 1.25 ?Hgb wnl ? ?Goal of Therapy:  ?Heparin level 0.3-0.7 units/ml ?Monitor platelets by anticoagulation protocol: Yes ?  ?Plan:  ?Dc Lovenox ?Heparin bolus 4000 units x1 then 1350 units/hr ?HL in AM then daily ? ?Onnie Boer, PharmD, BCIDP, AAHIVP, CPP ?Infectious Disease Pharmacist ?01/06/2022 6:03 PM ? ? ? ? ?

## 2022-01-07 ENCOUNTER — Other Ambulatory Visit (HOSPITAL_COMMUNITY): Payer: Self-pay

## 2022-01-07 ENCOUNTER — Encounter (HOSPITAL_COMMUNITY)
Admission: EM | Disposition: A | Discharge status: Home Health | Payer: Self-pay | Source: Home / Self Care | Attending: Thoracic Surgery (Cardiothoracic Vascular Surgery)

## 2022-01-07 ENCOUNTER — Encounter (HOSPITAL_COMMUNITY): Payer: Self-pay | Admitting: Family Medicine

## 2022-01-07 DIAGNOSIS — I214 Non-ST elevation (NSTEMI) myocardial infarction: Secondary | ICD-10-CM

## 2022-01-07 DIAGNOSIS — I25110 Atherosclerotic heart disease of native coronary artery with unstable angina pectoris: Secondary | ICD-10-CM

## 2022-01-07 DIAGNOSIS — J449 Chronic obstructive pulmonary disease, unspecified: Secondary | ICD-10-CM

## 2022-01-07 DIAGNOSIS — I2510 Atherosclerotic heart disease of native coronary artery without angina pectoris: Secondary | ICD-10-CM

## 2022-01-07 DIAGNOSIS — I5042 Chronic combined systolic (congestive) and diastolic (congestive) heart failure: Secondary | ICD-10-CM

## 2022-01-07 DIAGNOSIS — I5041 Acute combined systolic (congestive) and diastolic (congestive) heart failure: Secondary | ICD-10-CM

## 2022-01-07 HISTORY — PX: CORONARY ANGIOGRAPHY: CATH118303

## 2022-01-07 LAB — URINALYSIS, ROUTINE W REFLEX MICROSCOPIC
Bilirubin Urine: NEGATIVE
Glucose, UA: NEGATIVE mg/dL
Hgb urine dipstick: NEGATIVE
Ketones, ur: NEGATIVE mg/dL
Leukocytes,Ua: NEGATIVE
Nitrite: NEGATIVE
Protein, ur: NEGATIVE mg/dL
Specific Gravity, Urine: 1.017 (ref 1.005–1.030)
pH: 6 (ref 5.0–8.0)

## 2022-01-07 LAB — BASIC METABOLIC PANEL
Anion gap: 9 (ref 5–15)
BUN: 10 mg/dL (ref 8–23)
CO2: 25 mmol/L (ref 22–32)
Calcium: 8.5 mg/dL — ABNORMAL LOW (ref 8.9–10.3)
Chloride: 104 mmol/L (ref 98–111)
Creatinine, Ser: 1.34 mg/dL — ABNORMAL HIGH (ref 0.61–1.24)
GFR, Estimated: 56 mL/min — ABNORMAL LOW (ref >60)
Glucose, Bld: 117 mg/dL — ABNORMAL HIGH (ref 70–99)
Potassium: 3.5 mmol/L (ref 3.5–5.1)
Sodium: 138 mmol/L (ref 135–145)

## 2022-01-07 LAB — CBC
HCT: 39.3 % (ref 39.0–52.0)
Hemoglobin: 14 g/dL (ref 13.0–17.0)
MCH: 30.1 pg (ref 26.0–34.0)
MCHC: 35.6 g/dL (ref 30.0–36.0)
MCV: 84.5 fL (ref 80.0–100.0)
Platelets: 163 10*3/uL (ref 150–400)
RBC: 4.65 MIL/uL (ref 4.22–5.81)
RDW: 13.3 % (ref 11.5–15.5)
WBC: 6.5 10*3/uL (ref 4.0–10.5)
nRBC: 0 % (ref 0.0–0.2)

## 2022-01-07 LAB — HEMOGLOBIN A1C
Hgb A1c MFr Bld: 5.2 % (ref 4.8–5.6)
Mean Plasma Glucose: 102.54 mg/dL

## 2022-01-07 LAB — GLUCOSE, CAPILLARY
Glucose-Capillary: 137 mg/dL — ABNORMAL HIGH (ref 70–99)
Glucose-Capillary: 153 mg/dL — ABNORMAL HIGH (ref 70–99)
Glucose-Capillary: 209 mg/dL — ABNORMAL HIGH (ref 70–99)

## 2022-01-07 LAB — SURGICAL PCR SCREEN
MRSA, PCR: NEGATIVE
Staphylococcus aureus: NEGATIVE

## 2022-01-07 LAB — ABO/RH: ABO/RH(D): O POS

## 2022-01-07 LAB — PREPARE RBC (CROSSMATCH)

## 2022-01-07 LAB — HEPARIN LEVEL (UNFRACTIONATED)
Heparin Unfractionated: 0.13 IU/mL — ABNORMAL LOW (ref 0.30–0.70)
Heparin Unfractionated: 0.37 IU/mL (ref 0.30–0.70)

## 2022-01-07 SURGERY — CORONARY ANGIOGRAPHY (CATH LAB)
Anesthesia: LOCAL

## 2022-01-07 MED ORDER — LABETALOL HCL 5 MG/ML IV SOLN: 10.0000 mg | INTRAVENOUS | Status: AC | PRN

## 2022-01-07 MED ORDER — NITROGLYCERIN IN D5W 200-5 MCG/ML-% IV SOLN
2.0000 ug/min | INTRAVENOUS | Status: DC
  Filled 2022-01-07: qty 250

## 2022-01-07 MED ORDER — NITROGLYCERIN 1 MG/10 ML FOR IR/CATH LAB
INTRA_ARTERIAL | Status: AC
  Filled 2022-01-07: qty 10

## 2022-01-07 MED ORDER — LIDOCAINE HCL (PF) 1 % IJ SOLN
INTRAMUSCULAR | Status: DC | PRN
  Administered 2022-01-07: 2 mL

## 2022-01-07 MED ORDER — INSULIN REGULAR(HUMAN) IN NACL 100-0.9 UT/100ML-% IV SOLN
INTRAVENOUS | Status: AC
  Administered 2022-01-08: 1 [IU]/h via INTRAVENOUS
  Filled 2022-01-07: qty 100

## 2022-01-07 MED ORDER — NOREPINEPHRINE 4 MG/250ML-% IV SOLN
0.0000 ug/min | INTRAVENOUS | Status: DC
  Filled 2022-01-07: qty 250

## 2022-01-07 MED ORDER — VERAPAMIL HCL 2.5 MG/ML IV SOLN
INTRA_ARTERIAL | Status: DC | PRN
  Administered 2022-01-07: 10 mL via INTRA_ARTERIAL

## 2022-01-07 MED ORDER — NITROGLYCERIN 0.4 MG SL SUBL
0.4000 mg | SUBLINGUAL_TABLET | SUBLINGUAL | Status: DC | PRN
Start: 2022-01-07 — End: 2022-01-08
  Administered 2022-01-07: 0.4 mg via SUBLINGUAL
  Filled 2022-01-07: qty 1

## 2022-01-07 MED ORDER — IOHEXOL 350 MG/ML SOLN
INTRAVENOUS | Status: DC | PRN
  Administered 2022-01-07: 45 mL

## 2022-01-07 MED ORDER — HEPARIN SODIUM (PORCINE) 1000 UNIT/ML IJ SOLN
INTRAMUSCULAR | Status: DC | PRN
  Administered 2022-01-07: 5000 [IU] via INTRAVENOUS

## 2022-01-07 MED ORDER — CEFAZOLIN SODIUM-DEXTROSE 2-4 GM/100ML-% IV SOLN
2.0000 g | INTRAVENOUS | Status: AC
  Administered 2022-01-08 (×2): 2 g via INTRAVENOUS
  Filled 2022-01-07: qty 100

## 2022-01-07 MED ORDER — MANNITOL 20 % IV SOLN
INTRAVENOUS | Status: DC
  Filled 2022-01-07: qty 13

## 2022-01-07 MED ORDER — SODIUM CHLORIDE 0.9% FLUSH
3.0000 mL | Freq: Two times a day (BID) | INTRAVENOUS | Status: DC
  Administered 2022-01-08: 3 mL via INTRAVENOUS

## 2022-01-07 MED ORDER — CHLORHEXIDINE GLUCONATE CLOTH 2 % EX PADS
6.0000 | MEDICATED_PAD | Freq: Once | CUTANEOUS | Status: AC
  Administered 2022-01-07: 6 via TOPICAL

## 2022-01-07 MED ORDER — ASPIRIN EC 81 MG PO TBEC: 81.0000 mg | DELAYED_RELEASE_TABLET | Freq: Every day | ORAL | Status: DC

## 2022-01-07 MED ORDER — TEMAZEPAM 15 MG PO CAPS: 15.0000 mg | ORAL_CAPSULE | Freq: Once | ORAL | Status: DC | PRN

## 2022-01-07 MED ORDER — VERAPAMIL HCL 2.5 MG/ML IV SOLN
INTRAVENOUS | Status: AC
  Filled 2022-01-07: qty 2

## 2022-01-07 MED ORDER — HEPARIN (PORCINE) IN NACL 1000-0.9 UT/500ML-% IV SOLN
INTRAVENOUS | Status: AC
  Filled 2022-01-07: qty 1000

## 2022-01-07 MED ORDER — HEPARIN SODIUM (PORCINE) 1000 UNIT/ML IJ SOLN
INTRAMUSCULAR | Status: AC
  Filled 2022-01-07: qty 10

## 2022-01-07 MED ORDER — ONDANSETRON HCL 4 MG/2ML IJ SOLN: 4.0000 mg | Freq: Four times a day (QID) | INTRAMUSCULAR | Status: DC | PRN

## 2022-01-07 MED ORDER — SODIUM CHLORIDE 0.9 % IV SOLN: 250.0000 mL | INTRAVENOUS | Status: DC | PRN

## 2022-01-07 MED ORDER — SODIUM CHLORIDE 0.9% FLUSH: 3.0000 mL | INTRAVENOUS | Status: DC | PRN

## 2022-01-07 MED ORDER — HEPARIN (PORCINE) 25000 UT/250ML-% IV SOLN
1900.0000 [IU]/h | INTRAVENOUS | Status: DC
  Administered 2022-01-07: 1650 [IU]/h via INTRAVENOUS
  Filled 2022-01-07 (×2): qty 250

## 2022-01-07 MED ORDER — ASPIRIN 81 MG PO CHEW
81.0000 mg | CHEWABLE_TABLET | Freq: Every day | ORAL | Status: DC
  Administered 2022-01-08: 81 mg via ORAL
  Filled 2022-01-07: qty 1

## 2022-01-07 MED ORDER — CHLORHEXIDINE GLUCONATE 0.12 % MT SOLN
15.0000 mL | Freq: Once | OROMUCOSAL | Status: AC
  Administered 2022-01-08: 15 mL via OROMUCOSAL
  Filled 2022-01-07: qty 15

## 2022-01-07 MED ORDER — LIDOCAINE HCL (PF) 1 % IJ SOLN
INTRAMUSCULAR | Status: AC
  Filled 2022-01-07: qty 30

## 2022-01-07 MED ORDER — HYDRALAZINE HCL 20 MG/ML IJ SOLN: 10.0000 mg | INTRAMUSCULAR | Status: AC | PRN

## 2022-01-07 MED ORDER — CHLORHEXIDINE GLUCONATE CLOTH 2 % EX PADS
6.0000 | MEDICATED_PAD | Freq: Once | CUTANEOUS | Status: AC
Start: 2022-01-07 — End: 2022-01-08
  Administered 2022-01-08: 6 via TOPICAL

## 2022-01-07 MED ORDER — EPINEPHRINE HCL 5 MG/250ML IV SOLN IN NS
0.0000 ug/min | INTRAVENOUS | Status: DC
  Filled 2022-01-07: qty 250

## 2022-01-07 MED ORDER — SACUBITRIL-VALSARTAN 49-51 MG PO TABS
1.0000 | ORAL_TABLET | Freq: Two times a day (BID) | ORAL | Status: DC
  Administered 2022-01-08: 1 via ORAL
  Filled 2022-01-07 (×2): qty 1

## 2022-01-07 MED ORDER — PAPAVERINE HCL 30 MG/ML IJ SOLN
INTRAMUSCULAR | Status: DC
  Filled 2022-01-07: qty 2.5

## 2022-01-07 MED ORDER — POTASSIUM CHLORIDE 2 MEQ/ML IV SOLN
80.0000 meq | INTRAVENOUS | Status: DC
  Filled 2022-01-07: qty 40

## 2022-01-07 MED ORDER — BISACODYL 5 MG PO TBEC: 5.0000 mg | DELAYED_RELEASE_TABLET | Freq: Once | ORAL | Status: DC

## 2022-01-07 MED ORDER — HEPARIN BOLUS VIA INFUSION
3000.0000 [IU] | Freq: Once | INTRAVENOUS | Status: AC
  Administered 2022-01-07: 3000 [IU] via INTRAVENOUS
  Filled 2022-01-07: qty 3000

## 2022-01-07 MED ORDER — TRANEXAMIC ACID (OHS) PUMP PRIME SOLUTION
2.0000 mg/kg | INTRAVENOUS | Status: DC
Start: 2022-01-08 — End: 2022-01-08
  Filled 2022-01-07: qty 2.1

## 2022-01-07 MED ORDER — DEXMEDETOMIDINE HCL IN NACL 400 MCG/100ML IV SOLN
0.1000 ug/kg/h | INTRAVENOUS | Status: AC
  Administered 2022-01-08: .5 ug/kg/h via INTRAVENOUS
  Filled 2022-01-07: qty 100

## 2022-01-07 MED ORDER — ACETAMINOPHEN 325 MG PO TABS
650.0000 mg | ORAL_TABLET | ORAL | Status: DC | PRN
  Administered 2022-01-08: 650 mg via ORAL
  Filled 2022-01-07: qty 2

## 2022-01-07 MED ORDER — SODIUM CHLORIDE 0.9 % IV SOLN: INTRAVENOUS | Status: DC

## 2022-01-07 MED ORDER — VANCOMYCIN HCL 1500 MG/300ML IV SOLN
1500.0000 mg | INTRAVENOUS | Status: AC
  Administered 2022-01-08: 1500 mg via INTRAVENOUS
  Filled 2022-01-07: qty 300

## 2022-01-07 MED ORDER — CARVEDILOL 3.125 MG PO TABS
3.1250 mg | ORAL_TABLET | Freq: Two times a day (BID) | ORAL | Status: DC
  Administered 2022-01-07 – 2022-01-08 (×2): 3.125 mg via ORAL
  Filled 2022-01-07 (×2): qty 1

## 2022-01-07 MED ORDER — PHENYLEPHRINE HCL-NACL 20-0.9 MG/250ML-% IV SOLN
30.0000 ug/min | INTRAVENOUS | Status: AC
  Administered 2022-01-08: 15 ug/min via INTRAVENOUS
  Filled 2022-01-07: qty 250

## 2022-01-07 MED ORDER — MORPHINE SULFATE (PF) 2 MG/ML IV SOLN
2.0000 mg | INTRAVENOUS | Status: DC | PRN
  Administered 2022-01-07: 2 mg via INTRAVENOUS
  Filled 2022-01-07: qty 1

## 2022-01-07 MED ORDER — SODIUM CHLORIDE 0.9% FLUSH: 3.0000 mL | Freq: Two times a day (BID) | INTRAVENOUS | Status: DC

## 2022-01-07 MED ORDER — SODIUM CHLORIDE 0.9 % IV SOLN
1.5000 mg/kg/h | INTRAVENOUS | Status: AC
Start: 2022-01-08 — End: 2022-01-09
  Administered 2022-01-08: 1.5 mg/kg/h via INTRAVENOUS
  Filled 2022-01-07: qty 25

## 2022-01-07 MED ORDER — MILRINONE LACTATE IN DEXTROSE 20-5 MG/100ML-% IV SOLN
0.3000 ug/kg/min | INTRAVENOUS | Status: DC
  Filled 2022-01-07: qty 100

## 2022-01-07 MED ORDER — HEPARIN 30,000 UNITS/1000 ML (OHS) CELLSAVER SOLUTION
Status: DC
Start: 2022-01-08 — End: 2022-01-08
  Filled 2022-01-07: qty 1000

## 2022-01-07 MED ORDER — SODIUM CHLORIDE 0.9 % IV SOLN: INTRAVENOUS | Status: AC

## 2022-01-07 MED ORDER — HEPARIN (PORCINE) IN NACL 1000-0.9 UT/500ML-% IV SOLN
INTRAVENOUS | Status: DC | PRN
  Administered 2022-01-07 (×2): 500 mL

## 2022-01-07 MED ORDER — TRANEXAMIC ACID (OHS) BOLUS VIA INFUSION
15.0000 mg/kg | INTRAVENOUS | Status: AC
  Administered 2022-01-08: 1575 mg via INTRAVENOUS
  Filled 2022-01-07: qty 1575

## 2022-01-07 MED ORDER — CEFAZOLIN SODIUM-DEXTROSE 2-4 GM/100ML-% IV SOLN
2.0000 g | INTRAVENOUS | Status: DC
  Filled 2022-01-07: qty 100

## 2022-01-07 MED ORDER — METOPROLOL TARTRATE 12.5 MG HALF TABLET
12.5000 mg | ORAL_TABLET | Freq: Once | ORAL | Status: DC
  Filled 2022-01-07: qty 1

## 2022-01-07 SURGICAL SUPPLY — 10 items
CATH OPTITORQUE TIG 4.0 5F (CATHETERS) ×1 IMPLANT
DEVICE RAD COMP TR BAND LRG (VASCULAR PRODUCTS) ×1 IMPLANT
GLIDESHEATH SLEND A-KIT 6F 22G (SHEATH) ×1 IMPLANT
GUIDEWIRE INQWIRE 1.5J.035X260 (WIRE) IMPLANT
INQWIRE 1.5J .035X260CM (WIRE) ×2
KIT HEART LEFT (KITS) ×2 IMPLANT
PACK CARDIAC CATHETERIZATION (CUSTOM PROCEDURE TRAY) ×2 IMPLANT
TRANSDUCER W/STOPCOCK (MISCELLANEOUS) ×2 IMPLANT
TUBING CIL FLEX 10 FLL-RA (TUBING) ×2 IMPLANT
WIRE HI TORQ VERSACORE-J 145CM (WIRE) ×1 IMPLANT

## 2022-01-07 NOTE — Progress Notes (Signed)
Unable to obtain ABG.

## 2022-01-07 NOTE — Progress Notes (Signed)
Received updates for blood pressures by RN. Pt had elevated blood pressures without symptoms of vision changes, headaches or chest pain. Manual blood pressures checked with most recent being L arm 180/70. Continue with CPAP overnight. Opted to monitor at this time. ?

## 2022-01-07 NOTE — Progress Notes (Incomplete)
Heart Failure Stewardship Pharmacist Progress Note   PCP: Forrest Moron, MD PCP-Cardiologist: None    HPI:  ***  Current HF Medications: {HF MEDS CURRENT:26665}  Prior to admission HF Medications: {HF MEDS PTA:26664}  Pertinent Lab Values: Serum creatinine ***, BUN ***, Potassium ***, Sodium ***, BNP ***, Magnesium ***, A1c ***, Digoxin ***   Vital Signs: Weight: *** lbs (admission weight: *** lbs) Blood pressure: ***  Heart rate: ***  I/O: -***L yesterday; net -***L ReDS reading: ***  Medication Assistance / Insurance Benefits Check: Does the patient have prescription insurance?  {Yes/No/Pending:24180} Type of insurance plan: ***  Does the patient qualify for medication assistance through manufacturers or grants?   {Yes/No/Pending:24180} Eligible grants and/or patient assistance programs: *** Medication assistance applications in progress: ***  Medication assistance applications approved: *** Approved medication assistance renewals will be completed by: ***  Outpatient Pharmacy:  Prior to admission outpatient pharmacy: *** Is the patient willing to use [facility] TOC pharmacy at discharge? {Yes/No/Pending:24180} Is the patient willing to transition their outpatient pharmacy to utilize a [facility] outpatient pharmacy?   {Yes/No/Pending:24180}    Assessment: 1. ***Acute on chronic systolic CHF (EF ***), due to ***. NYHA class *** symptoms. -    Plan: 1) Medication changes recommended at this time: -  2) Patient assistance: -  3)  Education  - To be completed prior to discharge  Sharen Hones, PharmD, BCPS Heart Failure Stewardship Pharmacist Phone [phone]

## 2022-01-07 NOTE — Progress Notes (Signed)
Pt's BP's have been high tonight now BP manually 190/90 and 180/70, HR in the 60's.  On call MD notified and aware.  Pt is here with a NSTEMI, no s/s now, but the BP has been high off and on since admission.  Pt could benefit with some PRN meds to help control the BP better.  Will continue to monitor, Thanks Lavonda Jumbo RN.  ?

## 2022-01-07 NOTE — TOC Progression Note (Signed)
Transition of Care (TOC) - Progression Note  ? ? ?Patient Details  ?Name: Raymond [name] ?MRN: [id number] ?Date of Birth: 1951-08-23 ? ?Transition of Care (TOC) CM/SW Contact  ?Leone Haven, RN ?Phone Number: ?01/07/2022, 3:22 PM ? ?Clinical Narrative:    ?Patient is from home with wife, NCM offered choice for HHPT, the daughter at the bedside chose [place].  NCM made referral to [facility] with Frances Furbish, he is able to take referral, soc will begin 24 to 48 hrs post dc.  He will also need a BSC at discharge.  NCM made referral to [facility] with [facility].  Per wife states he was just told he will need a CABG.  TOC wlll continue to follow for dc needs.  ? ? ?Expected Discharge Plan: Home w Home Health Services ?Barriers to Discharge: Continued Medical Work up ? ?Expected Discharge Plan and Services ?Expected Discharge Plan: Home w Home Health Services ?In-house Referral: NA ?Discharge Planning Services: CM Consult ?Post Acute Care Choice: Home Health, Durable Medical Equipment ?Living arrangements for the past 2 months: Single Family Home ?                ?DME Arranged: Bedside commode ?DME Agency: [employer] ?Date DME Agency Contacted: 01/07/22 ?Time DME Agency Contacted: 1521 ?Representative spoke with at DME Agency: Silvio Pate ?HH Arranged: PT ?HH Agency: [employer], [employer] ?Date HH Agency Contacted: 01/07/22 ?Time HH Agency Contacted: 1521 ?Representative spoke with at [facility] Agency: [facility] ? ? ?Social Determinants of Health (SDOH) Interventions ?Food Insecurity Interventions: Intervention Not Indicated ?Financial Strain Interventions: Intervention Not Indicated ?Housing Interventions: Intervention Not Indicated ?Transportation Interventions: Intervention Not Indicated ? ?Readmission Risk Interventions ?No flowsheet data found. ? ?

## 2022-01-07 NOTE — Progress Notes (Addendum)
PT c/o chest pain across this chest about a 3/10 pain relieved on its own. Paged cardiologist awaiting response EKG obtained and place in chart VSS at this point. Plan is for cath lab later today.    Dr. Duke Salvia aware no changes

## 2022-01-07 NOTE — Plan of Care (Signed)
  Problem: Coping: Goal: Level of anxiety will decrease Outcome: Progressing   Problem: Pain Managment: Goal: General experience of comfort will improve Outcome: Progressing   Problem: Safety: Goal: Ability to remain free from injury will improve Outcome: Progressing   

## 2022-01-07 NOTE — Progress Notes (Signed)
FPTS Brief Progress Note ? ?S: Pt reported left posterior shoulder pain and recent shortness of breath. He also admits he is anxious about his procedure and the outcome of tomorrow and believes that anxiety may have also precipitated these recent symptoms. He reports that the shoulder pain improves with massage.  ? ?O: ?BP (!) 150/71   Pulse 60   Temp 98 ?F (36.7 ?C) (Oral)   Resp (!) 21   Ht 5\' 5"  (1.651 m)   Wt 105 kg   SpO2 96%   BMI 38.52 kg/m?   ?Gen: awake, alert, calm and appropriate in conversation, NAD ?CV: RRR, no murmurs auscultated ?Resp: CTAB, no increased work of breathing appreciated, no retractions, wheezing, or crackles ?MSK: left shoulder nontender to palpation ? ?A/P: ?L shoulder pain  shortness of breath  NSTEMI w/ LV thrombus ?RN notified Cardiology team who opted to treat this recent pain as cardiac in nature. Pt hemodynamically stable at this time and vitals reassuring. Will defer to cardiology team given CABG tomorrow.  ?- Orders reviewed. Labs for AM ordered, which was adjusted as needed.  ?- Continue current medications ?- NPO at MN for upcoming CABG ? ? ? , DO ?01/07/2022, 10:23 PM ?PGY-1, [facility] Night Resident  ?Please page [phone] with questions.  ?  ?

## 2022-01-07 NOTE — Progress Notes (Signed)
Patient sleeping and resting comfortably.  Rounded with primary RN.  BP elevated around MN but had gone back down and was asymptomatic.   Appreciated nightly round. ? ?Today's Vitals  ? 01/07/22 0200 01/07/22 0346 01/07/22 0355 01/07/22 0427  ?BP: (!) 175/94 (!) 190/91 (!) 190/90 (!) 180/70  ?Pulse: 63 62 (!) 59   ?Resp:  20    ?Temp:  98.5 ?F (36.9 ?C)    ?TempSrc:  Oral    ?SpO2:  95%    ?Weight:  105 kg    ?Height:      ?PainSc:      ? ?Body mass index is 38.52 kg/m?.  ? ?Elevated BP ?Asymptomatic.  Started Aldactone yesterday.  ?Continue current medications ?Monitor BP q4h ? ? ?Dana Allan, MD ?Family Medicine Residency    ?

## 2022-01-07 NOTE — H&P (View-Only) (Signed)
Progress Note  Patient Name: Raymond [name] Date of Encounter: 01/07/2022  [facility] Cardiologist: None   Subjective   Feeling well.  Breathing much better than on admission.   Inpatient Medications    Scheduled Meds:  aspirin  325 mg Oral Daily   atorvastatin  20 mg Oral q1800   doxazosin  12 mg Oral QHS   furosemide  20 mg Intravenous BID   sertraline  100 mg Oral Daily   spironolactone  25 mg Oral Daily   Continuous Infusions:  heparin 1,650 Units/hr (01/07/22 0724)   PRN Meds: acetaminophen **OR** acetaminophen, albuterol, polyethylene glycol   Vital Signs    Vitals:   01/07/22 0200 01/07/22 0346 01/07/22 0355 01/07/22 0427  BP: (!) 175/94 (!) 190/91 (!) 190/90 (!) 180/70  Pulse: 63 62 (!) 59 61  Resp:  20    Temp:  98.5 F (36.9 C)    TempSrc:  Oral    SpO2:  95%    Weight:  105 kg    Height:        Intake/Output Summary (Last 24 hours) at 01/07/2022 1105 Last data filed at 01/07/2022 0827 Gross per 24 hour  Intake 484.59 ml  Output 1600 ml  Net -1115.41 ml   Last 3 Weights 01/07/2022 01/06/2022 01/05/2022  Weight (lbs) 231 lb 7.7 oz 232 lb 9.4 oz 231 lb 11.2 oz  Weight (kg) 105 kg 105.5 kg 105.098 kg  Some encounter information is confidential and restricted. Go to Review Flowsheets activity to see all data.      Telemetry    Sinus rhythm.  PVCs - Personally Reviewed  ECG    Sinus rhythm.  Rate 63.  Nonspecific T wave abnormalities. - Personally Reviewed  Physical Exam   VS:  BP (!) 166/82    Pulse 66    Temp 98.5 F (36.9 C) (Oral)    Resp 20    Ht 5\' 5"  (1.651 m)    Wt 105 kg    SpO2 96%    BMI 38.52 kg/m  , BMI Body mass index is 38.52 kg/m. GENERAL:  Well appearing HEENT: Pupils equal round and reactive, fundi not visualized, oral mucosa unremarkable NECK:  No jugular venous distention, waveform within normal limits, carotid upstroke brisk and symmetric, no bruits, no thyromegaly LUNGS:  Clear to auscultation bilaterally HEART:  RRR.  PMI  not displaced or sustained,S1 and S2 within normal limits, no S3, no S4, no clicks, no rubs, no murmurs ABD:  Flat, positive bowel sounds normal in frequency in pitch, no bruits, no rebound, no guarding, no midline pulsatile mass, no hepatomegaly, no splenomegaly EXT:  2 plus pulses throughout, no edema, no cyanosis no clubbing SKIN:  No rashes no nodules NEURO:  Cranial nerves II through XII grossly intact, motor grossly intact throughout PSYCH:  Cognitively intact, oriented to person place and time   Labs    High Sensitivity Troponin:   Recent Labs  Lab 01/05/22 1627 01/05/22 1758 01/06/22 0146 01/06/22 0437  TROPONINIHS 34* 38* 95* 97*     Chemistry Recent Labs  Lab 01/06/22 0146 01/06/22 0505 01/07/22 0336  NA 140 141 138  K 3.2* 3.2* 3.5  CL 105 106 104  CO2 27 26 25   GLUCOSE 181* 115* 117*  BUN 12 12 10   CREATININE 1.28* 1.25* 1.34*  CALCIUM 8.6* 8.6* 8.5*  GFRNONAA 59* >60 56*  ANIONGAP 8 9 9     Lipids  Recent Labs  Lab 01/06/22 0146  CHOL 124  TRIG 111  HDL 35*  LDLCALC 67  CHOLHDL 3.5    Hematology Recent Labs  Lab 01/05/22 1627 01/07/22 0336  WBC 5.7 6.5  RBC 4.71 4.65  HGB 14.3 14.0  HCT 41.1 39.3  MCV 87.3 84.5  MCH 30.4 30.1  MCHC 34.8 35.6  RDW 13.5 13.3  PLT 166 163   Thyroid No results for input(s): TSH, FREET4 in the last 168 hours.  BNP Recent Labs  Lab 01/05/22 1748  BNP 1,096.9*    DDimer No results for input(s): DDIMER in the last 168 hours.   Radiology    DG Chest 2 View  Result Date: 01/05/2022 CLINICAL DATA:  chest pain EXAM: CHEST - 2 VIEW COMPARISON:  October 18, 2020 FINDINGS: The cardiomediastinal silhouette is unchanged and enlarged in contour.Cardiac loop recorder. Trace bilateral pleural effusions. No pneumothorax. Perihilar vascular congestion with diffuse interstitial markings. Visualized abdomen is unremarkable. Multilevel degenerative changes of the thoracic spine. IMPRESSION: Constellation of findings are  favored to reflect pulmonary edema with trace bilateral pleural effusions. Differential considerations include atypical infection. Electronically Signed   By: Valentino Saxon M.D.   On: 01/05/2022 16:56   CT Angio Chest PE W and/or Wo Contrast  Result Date: 01/05/2022 CLINICAL DATA:  Nonproductive cough for 2 days. Cough worse today with shortness of breath. EXAM: CT ANGIOGRAPHY CHEST WITH CONTRAST TECHNIQUE: Multidetector CT imaging of the chest was performed using the standard protocol during bolus administration of intravenous contrast. Multiplanar CT image reconstructions and MIPs were obtained to evaluate the vascular anatomy. RADIATION DOSE REDUCTION: This exam was performed according to the departmental dose-optimization program which includes automated exposure control, adjustment of the mA and/or kV according to patient size and/or use of iterative reconstruction technique. CONTRAST:  133mL OMNIPAQUE IOHEXOL 350 MG/ML SOLN COMPARISON:  Current and prior chest radiographs. FINDINGS: Cardiovascular: Pulmonary arteries are well opacified. There is no evidence of a pulmonary embolism. Heart is mildly enlarged. Trace pericardial effusion. Mild left and circumflex coronary artery calcifications. Aorta is not opacified. Is normal in caliber. No atherosclerotic calcifications. Mediastinum/Nodes: No neck base, mediastinal or hilar masses or enlarged lymph nodes. Trachea and esophagus are unremarkable. Lungs/Pleura: Small pleural effusions. Mild interstitial thickening. Mild dependent atelectasis in the lower lobes at the posterior lung bases. Small calcified granuloma in the left upper lobe near the apex. No convincing pneumonia. No pneumothorax. Upper Abdomen: No acute abnormality. Musculoskeletal: No fracture or bone lesion.  No chest wall masses. Review of the MIP images confirms the above findings. IMPRESSION: 1. No evidence of a pulmonary embolism. 2. No convincing pneumonia. 3. Cardiomegaly, small  effusions and mild interstitial thickening supports the chest radiographic impression of mild congestive heart failure. Electronically Signed   By: Lajean Manes M.D.   On: 01/05/2022 18:30   ECHOCARDIOGRAM COMPLETE  Result Date: 01/06/2022    ECHOCARDIOGRAM REPORT   Patient Name:   Raymond [name] Date of Exam: 01/06/2022 Medical Rec #:  [id number]     Height:       65.0 in Accession #:    [id number]    Weight:       232.6 lb Date of Birth:  [birth year]    BSA:          2.109 m Patient Age:    23 years      BP:           154/81 mmHg Patient Gender: M  HR:           61 bpm. Exam Location:  Inpatient Procedure: 2D Echo Indications:    CHF  History:        Patient has prior history of Echocardiogram examinations, most                 recent 06/24/2017. CHF; Risk Factors:Hypertension and Diabetes.  Sonographer:    Jefferey Pica Referring Phys: [id number]  1. Suspicious for 14 x 5 mm thrombus (broad-based and fixed) at the level of the inferior aspect of the apical left ventricular dyskinesia. Left ventricular ejection fraction, by estimation, is 35 to 40%. The left ventricle has mild to moderately decreased function. The left ventricle demonstrates regional wall motion abnormalities (see scoring diagram/findings for description). The left ventricular internal cavity size was mildly dilated. There is moderate concentric left ventricular hypertrophy. Left ventricular diastolic parameters are consistent with Grade II diastolic dysfunction (pseudonormalization). Elevated left atrial pressure. There is moderate dyskinesis of the left ventricular, apical segment. There is severe hypokinesis of the left ventricular, mid-apical anterior wall and anteroseptal wall.  2. Right ventricular systolic function is normal. The right ventricular size is normal.  3. Left atrial size was mild to moderately dilated.  4. The mitral valve is normal in structure. Mild mitral valve regurgitation. No  evidence of mitral stenosis.  5. The aortic valve is tricuspid. Aortic valve regurgitation is not visualized. No aortic stenosis is present.  6. The inferior vena cava is normal in size with greater than 50% respiratory variability, suggesting right atrial pressure of 3 mmHg. Comparison(s): Prior images unable to be directly viewed, comparison made by report only. FINDINGS  Left Ventricle: Suspicious for 14 x 5 mm thrombus (broad-based and fixed) at the level of the inferior aspect of the apical left ventricular dyskinesia. Left ventricular ejection fraction, by estimation, is 35 to 40%. The left ventricle has mild to moderately decreased function. The left ventricle demonstrates regional wall motion abnormalities. Severe hypokinesis of the left ventricular, mid-apical anterior wall and anteroseptal wall. The left ventricular internal cavity size was mildly dilated. There is moderate concentric left ventricular hypertrophy. Left ventricular diastolic parameters are consistent with Grade II diastolic dysfunction (pseudonormalization). Elevated left atrial pressure.  LV Wall Scoring: The apical septal segment, apical inferior segment, and apex are dyskinetic. The mid and distal anterior wall and mid anteroseptal segment are hypokinetic. Right Ventricle: The right ventricular size is normal. No increase in right ventricular wall thickness. Right ventricular systolic function is normal. Left Atrium: Left atrial size was mild to moderately dilated. Right Atrium: Right atrial size was normal in size. Pericardium: Trivial pericardial effusion is present. Mitral Valve: The mitral valve is normal in structure. Mild mitral valve regurgitation. No evidence of mitral valve stenosis. Tricuspid Valve: The tricuspid valve is normal in structure. Tricuspid valve regurgitation is not demonstrated. No evidence of tricuspid stenosis. Aortic Valve: The aortic valve is tricuspid. Aortic valve regurgitation is not visualized. No aortic  stenosis is present. Aortic valve peak gradient measures 4.9 mmHg. Pulmonic Valve: The pulmonic valve was normal in structure. Pulmonic valve regurgitation is not visualized. No evidence of pulmonic stenosis. Aorta: The aortic root is normal in size and structure. Venous: The inferior vena cava is normal in size with greater than 50% respiratory variability, suggesting right atrial pressure of 3 mmHg. IAS/Shunts: No atrial level shunt detected by color flow Doppler.  LEFT VENTRICLE PLAX 2D LVIDd:  5.80 cm   Diastology LVIDs:         4.40 cm   LV e' medial:    4.80 cm/s LV PW:         1.70 cm   LV E/e' medial:  18.8 LV IVS:        1.50 cm   LV e' lateral:   5.33 cm/s LVOT diam:     2.00 cm   LV E/e' lateral: 16.9 LV SV:         51 LV SV Index:   24 LVOT Area:     3.14 cm  RIGHT VENTRICLE             IVC RV S prime:     13.70 cm/s  IVC diam: 2.00 cm TAPSE (M-mode): 2.9 cm LEFT ATRIUM             Index LA diam:        4.40 cm 2.09 cm/m LA Vol (A2C):   79.0 ml 37.43 ml/m LA Vol (A4C):   79.9 ml 37.88 ml/m LA Biplane Vol: 95.6 ml 45.33 ml/m  AORTIC VALVE                 PULMONIC VALVE AV Area (Vmax): 2.46 cm     PV Vmax:       0.95 m/s AV Vmax:        110.50 cm/s  PV Peak grad:  3.6 mmHg AV Peak Grad:   4.9 mmHg LVOT Vmax:      86.50 cm/s LVOT Vmean:     54.500 cm/s LVOT VTI:       0.163 m  AORTA Ao Root diam: 3.80 cm Ao Asc diam:  3.10 cm MITRAL VALVE MV Area (PHT): 4.17 cm    SHUNTS MV Decel Time: 182 msec    Systemic VTI:  0.16 m MV E velocity: 90.30 cm/s  Systemic Diam: 2.00 cm MV A velocity: 82.70 cm/s MV E/A ratio:  1.09 Mihai Croitoru MD Electronically signed by Sanda Klein MD Signature Date/Time: 01/06/2022/5:33:26 PM    Final     Cardiac Studies   Echo 01/07/22:  1. Suspicious for 14 x 5 mm thrombus (broad-based and fixed) at the level  of the inferior aspect of the apical left ventricular dyskinesia. Left  ventricular ejection fraction, by estimation, is 35 to 40%. The left  ventricle has  mild to moderately  decreased function. The left ventricle demonstrates regional wall motion  abnormalities (see scoring diagram/findings for description). The left  ventricular internal cavity size was mildly dilated. There is moderate  concentric left ventricular  hypertrophy. Left ventricular diastolic parameters are consistent with  Grade II diastolic dysfunction (pseudonormalization). Elevated left atrial  pressure. There is moderate dyskinesis of the left ventricular, apical  segment. There is severe hypokinesis  of the left ventricular, mid-apical anterior wall and anteroseptal wall.   2. Right ventricular systolic function is normal. The right ventricular  size is normal.   3. Left atrial size was mild to moderately dilated.   4. The mitral valve is normal in structure. Mild mitral valve  regurgitation. No evidence of mitral stenosis.   5. The aortic valve is tricuspid. Aortic valve regurgitation is not  visualized. No aortic stenosis is present.   6. The inferior vena cava is normal in size with greater than 50%  respiratory variability, suggesting right atrial pressure of 3 mmHg.   Patient Profile     71 y.o. male with diabetes, obesity, hypertension,  stroke, PAF, hyperlipidemia, and OSA on CPAP admitted with acute systolic and diastolic heart failure.  Assessment & Plan    #NSTEMI: # Hyperlipidemia: Patient likely had an out of hospital MI that presented mostly as indigestion.  Echo now with acute systolic and diastolic heart failure.  Switch aspirin to 81mg , especially given that he will need anticoagulation for LV thrombus.  Add low dose carvedilol and continue atorvastatin.  #Acute systolic diastolic heart failure: Echo this admission with LVEF 35-40% down from 45% previously.  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  Apical thrombus on echo.  Planning for [facility] as above.    # LV thrombus:  Continue heparin.  Transition to warfarin post cath presuming he doesn't need  CABG.  # Hypertension:  BP very elevated, though was reportedly normal with PCP a few weeks ago.  Losartan held.  Will start [facility] tomorrow and very low dose carvedilol given bradycardia. Continue dozazosin for now with consideration of a different alpha blocker per Dr. Loletha Grayer.   # Obesity:  Consider Ozempic.  Needs cardiac rehab at discharge.   # DM:  Will need Jardiance/Farxiga given new HF.      For questions or updates, please contact [place] Please consult [email or web address] for contact info under        Signed, Skeet Latch, MD  01/07/2022, 11:05 AM

## 2022-01-07 NOTE — Interval H&P Note (Signed)
Cath Lab Visit (complete for each Cath Lab visit) ? ?Clinical Evaluation Leading to the Procedure:  ? ?ACS: Yes.   ? ?Non-ACS:   ? ?Anginal Classification: CCS II ? ?Anti-ischemic medical therapy: No Therapy ? ?Non-Invasive Test Results: No non-invasive testing performed ? ?Prior CABG: No previous CABG ? ? ? ? ? ?History and Physical Interval Note: ? ?01/07/2022 ?1:21 PM ? ?Raymond [name]  has presented today for surgery, with the diagnosis of NSTEMI.  The various methods of treatment have been discussed with the patient and family. After consideration of risks, benefits and other options for treatment, the patient has consented to  Procedure(s): ?LEFT HEART CATH AND CORONARY ANGIOGRAPHY (N/A) as a surgical intervention.  The patient's history has been reviewed, patient examined, no change in status, stable for surgery.  I have reviewed the patient's chart and labs.  Questions were answered to the patient's satisfaction.   ? ? ?Quay Burow ? ? ?

## 2022-01-07 NOTE — TOC Benefit Eligibility Note (Signed)
Patient Advocate Encounter ? ?Insurance verification completed.   ? ?The patient is currently admitted and upon discharge could be taking [place], [place] or [place]. ? ?The current 30 day co-pay is $47 for each  ? ?Sharlette Dense, CPhT ?Pharmacy Patient Advocate Specialist ?[facility] Patient Advocate Team ?Direct Number: [phone]  Fax: [phone]  ?

## 2022-01-07 NOTE — Progress Notes (Signed)
ANTICOAGULATION CONSULT NOTE - Follow Up Consult ? ?Pharmacy Consult for Heparin ?Indication:  Ventricular Thrombus ? ?Allergies  ?Allergen Reactions  ? Flomax [Tamsulosin Hcl] Other (See Comments)  ?  HYPOtension and syncope  ? ? ?Patient Measurements: ?Height: 5\' 5"  (165.1 cm) ?Weight: 105 kg (231 lb 7.7 oz) ?IBW/kg (Calculated) : 61.5 ?Heparin Dosing Weight: 85kg ? ?Vital Signs: ?Temp: 98.5 ?F (36.9 ?C) (03/06 1106) ?Temp Source: Oral (03/06 1106) ?BP: 166/82 (03/06 1106) ?Pulse Rate: 66 (03/06 1106) ? ?Labs: ?Recent Labs  ?  01/05/22 ?1627 01/05/22 ?1758 01/06/22 ?0146 01/06/22 ?03/08/22 01/06/22 ?0505 01/07/22 ?03/09/22 01/07/22 ?1039  ?HGB 14.3  --   --   --   --  14.0  --   ?HCT 41.1  --   --   --   --  39.3  --   ?PLT 166  --   --   --   --  163  --   ?HEPARINUNFRC  --   --   --   --   --  0.13* 0.37  ?CREATININE 1.24  --  1.28*  --  1.25* 1.34*  --   ?TROPONINIHS 34* 38* 95* 97*  --   --   --   ? ? ?Estimated Creatinine Clearance: 55.6 mL/min (A) (by C-G formula based on SCr of 1.34 mg/dL (H)). ? ? ?Medications:  ?Infusions:  ? sodium chloride    ? sodium chloride    ? heparin 1,650 Units/hr (01/07/22 0724)  ? ? ?Assessment: ?Raymond [name] is a 71 y/o main who presented with cough/shortness of breath. ECHO was performed and large LV thrombus was found. Heparin started for anticoagulation. LHC performed 3/6, plan to restart heparin gtt4 hours after sheath removal (1348). TCTS assessing for CABG this hospitalization.  ? ?Hgb stable, HCT decreased but WNL. PLTs WNL. Heparin level 0.37 therapeutic prior to catheterization with heparin running at 1,650 units/hr.  ? ?Goal of Therapy:  ?Heparin level 0.3-0.7 units/ml ?Monitor platelets by anticoagulation protocol: Yes ?  ?Plan:  ?Resume Heparin gtt at 1,650 units/hr at 1800.  Continue to monitor H&H and platelets. Monitor for s/sx of bleeding/clotting. Monitor CBC and heparin level daily.  ? ?59, PharmD Candidate ?01/07/2022,1:01 PM ? ? ?

## 2022-01-07 NOTE — Progress Notes (Addendum)
Family Medicine Teaching Service Daily Progress Note Intern Pager: [phone]  Patient name: Raymond [name] Medical record number: [id number] Date of birth: 1951-03-04 Age: 71 y.o. Gender: male  Primary Care Provider: Charleston Poot, MD Consultants: None Code Status: FULL  Pt Overview and Major Events to Date:  3/4: Admitted 3/5: LVEF 30-35%, severe wall motion abnormality, concern for small muram interventricular thrombus  Assessment and Plan: Raymond [name] is a 71 year old male who presented with cough, shortness of breath and orhtopnea found to have acute HF exacerbation receiving diuresis, with suubsequent finding of large LV thrombus, started on Heparin with plan for cardiac catheterization. PMH significant for HTN, T2DM,  HLD, OSA, depression.  Acute HFrEF exacerbation, improving Lasix re-dosed yesterday to 20mg  IV Lasix, having good UOP with 1.75L past 24 hours. Weight today is 105kg, down .1kg. I/O net negative 1,025. Feels breathing is good, much better from admission. On room air with SpO2 >90%.  Cr creeping up to 1.34, but stable. Echocardiogram on 3/5 with LVEF 35-40% (in 2018, 45-50%), GIIDD and severe hypokinesis of LV mid apical anterior wall andanteroseptal wall. Recommendation by cardiology to d/c Doxazosin (for prostatism) and transition to newer generation alpha blocker such as silodosin at some point. - Cardiology following - IV Lasix 20mg  BID - Heparin gtt - After revascularization, regimen with B-blocker/Entresto/SGLT2 inhibiter/ aldosterone antagonist and loop diuretic  HTN Elevated to 180/70 overnight, manual. Continued elevation this morning to 180s-190s/80-90s. Asymptmatic.  Will continue to monitor. If elevation [id number] systolic, will consider starting IV scheduled hydralazine. - Monitor VS - Continue Spironolactone - Hold Losartan given AKI with Cr 1.34 from 1.25, received dose this morning  NSTEMI with LV thrombus Cardiology suspects ST segment elevated MI with  extensive injury in distribution of LAD artery as seen on echo, probably 1 week ago. Denies chest pain with laying in bed. Possibly has widespread CAD and will benefit from coronary angiography and possible revascularization. - Plan for cardiac catheterization today tentatively pending re-evaluation by cardiology that he can lay flat for procedure   Other conditions chronic and stable OSA (continue CPAP), T2DM, Hyperlipidemia  FEN/GI: NPO PPx: Heparin gtt Dispo:Home with home health  pending clinical improvement . Barriers include cardiac catheterization and potential further surgical intervention, IV Lasix.   Subjective:  Feels well this morning. No complaints. Says breathing is much better from time of admission  Objective: Temp:  [97.9 F (36.6 C)-98.5 F (36.9 C)] 98.5 F (36.9 C) (03/06 0346) Pulse Rate:  [59-71] 61 (03/06 0427) Resp:  [18-20] 20 (03/06 0346) BP: (154-190)/(70-94) 180/70 (03/06 0427) SpO2:  [94 %-100 %] 95 % (03/06 0346) Weight:  [105 kg] 105 kg (03/06 0346) Physical Exam: General: Elderly gentleman in no distress Cardiovascular: RRR, no murmur Respiratory: Clear with no wheezing, rhonchi, rales Abdomen: Soft,obese, nontender, normal bowel sounds Extremities: No edema  Laboratory: Recent Labs  Lab 01/05/22 1627 01/07/22 0336  WBC 5.7 6.5  HGB 14.3 14.0  HCT 41.1 39.3  PLT 166 163   Recent Labs  Lab 01/06/22 0146 01/06/22 0505 01/07/22 0336  NA 140 141 138  K 3.2* 3.2* 3.5  CL 105 106 104  CO2 27 26 25   BUN 12 12 10   CREATININE 1.28* 1.25* 1.34*  CALCIUM 8.6* 8.6* 8.5*  GLUCOSE 181* 115* 117*    Imaging/Diagnostic Tests: ECHOCARDIOGRAM COMPLETE  Result Date: 01/06/2022    ECHOCARDIOGRAM REPORT   Patient Name:   Raymond [name] Date of Exam: 01/06/2022 Medical Rec #:  [id number]  Height:       65.0 in Accession #:    [id number]    Weight:       232.6 lb Date of Birth:  04-05-1951    BSA:          2.109 m Patient Age:    50 years      BP:            154/81 mmHg Patient Gender: M             HR:           61 bpm. Exam Location:  Inpatient Procedure: 2D Echo Indications:    CHF  History:        Patient has prior history of Echocardiogram examinations, most                 recent 06/24/2017. CHF; Risk Factors:Hypertension and Diabetes.  Sonographer:    Raymond [name] Referring Phys: Raymond [name]  1. Suspicious for 14 x 5 mm thrombus (broad-based and fixed) at the level of the inferior aspect of the apical left ventricular dyskinesia. Left ventricular ejection fraction, by estimation, is 35 to 40%. The left ventricle has mild to moderately decreased function. The left ventricle demonstrates regional wall motion abnormalities (see scoring diagram/findings for description). The left ventricular internal cavity size was mildly dilated. There is moderate concentric left ventricular hypertrophy. Left ventricular diastolic parameters are consistent with Grade II diastolic dysfunction (pseudonormalization). Elevated left atrial pressure. There is moderate dyskinesis of the left ventricular, apical segment. There is severe hypokinesis of the left ventricular, mid-apical anterior wall and anteroseptal wall.  2. Right ventricular systolic function is normal. The right ventricular size is normal.  3. Left atrial size was mild to moderately dilated.  4. The mitral valve is normal in structure. Mild mitral valve regurgitation. No evidence of mitral stenosis.  5. The aortic valve is tricuspid. Aortic valve regurgitation is not visualized. No aortic stenosis is present.  6. The inferior vena cava is normal in size with greater than 50% respiratory variability, suggesting right atrial pressure of 3 mmHg. Comparison(s): Prior images unable to be directly viewed, comparison made by report only. FINDINGS  Left Ventricle: Suspicious for 14 x 5 mm thrombus (broad-based and fixed) at the level of the inferior aspect of the apical left ventricular  dyskinesia. Left ventricular ejection fraction, by estimation, is 35 to 40%. The left ventricle has mild to moderately decreased function. The left ventricle demonstrates regional wall motion abnormalities. Severe hypokinesis of the left ventricular, mid-apical anterior wall and anteroseptal wall. The left ventricular internal cavity size was mildly dilated. There is moderate concentric left ventricular hypertrophy. Left ventricular diastolic parameters are consistent with Grade II diastolic dysfunction (pseudonormalization). Elevated left atrial pressure.  LV Wall Scoring: The apical septal segment, apical inferior segment, and apex are dyskinetic. The mid and distal anterior wall and mid anteroseptal segment are hypokinetic. Right Ventricle: The right ventricular size is normal. No increase in right ventricular wall thickness. Right ventricular systolic function is normal. Left Atrium: Left atrial size was mild to moderately dilated. Right Atrium: Right atrial size was normal in size. Pericardium: Trivial pericardial effusion is present. Mitral Valve: The mitral valve is normal in structure. Mild mitral valve regurgitation. No evidence of mitral valve stenosis. Tricuspid Valve: The tricuspid valve is normal in structure. Tricuspid valve regurgitation is not demonstrated. No evidence of tricuspid stenosis. Aortic Valve: The aortic valve is tricuspid. Aortic valve regurgitation is not visualized.  No aortic stenosis is present. Aortic valve peak gradient measures 4.9 mmHg. Pulmonic Valve: The pulmonic valve was normal in structure. Pulmonic valve regurgitation is not visualized. No evidence of pulmonic stenosis. Aorta: The aortic root is normal in size and structure. Venous: The inferior vena cava is normal in size with greater than 50% respiratory variability, suggesting right atrial pressure of 3 mmHg. IAS/Shunts: No atrial level shunt detected by color flow Doppler.  LEFT VENTRICLE PLAX 2D LVIDd:         5.80 cm    Diastology LVIDs:         4.40 cm   LV e' medial:    4.80 cm/s LV PW:         1.70 cm   LV E/e' medial:  18.8 LV IVS:        1.50 cm   LV e' lateral:   5.33 cm/s LVOT diam:     2.00 cm   LV E/e' lateral: 16.9 LV SV:         51 LV SV Index:   24 LVOT Area:     3.14 cm  RIGHT VENTRICLE             IVC RV S prime:     13.70 cm/s  IVC diam: 2.00 cm TAPSE (M-mode): 2.9 cm LEFT ATRIUM             Index LA diam:        4.40 cm 2.09 cm/m LA Vol (A2C):   79.0 ml 37.43 ml/m LA Vol (A4C):   79.9 ml 37.88 ml/m LA Biplane Vol: 95.6 ml 45.33 ml/m  AORTIC VALVE                 PULMONIC VALVE AV Area (Vmax): 2.46 cm     PV Vmax:       0.95 m/s AV Vmax:        110.50 cm/s  PV Peak grad:  3.6 mmHg AV Peak Grad:   4.9 mmHg LVOT Vmax:      86.50 cm/s LVOT Vmean:     54.500 cm/s LVOT VTI:       0.163 m  AORTA Ao Root diam: 3.80 cm Ao Asc diam:  3.10 cm MITRAL VALVE MV Area (PHT): 4.17 cm    SHUNTS MV Decel Time: 182 msec    Systemic VTI:  0.16 m MV E velocity: 90.30 cm/s  Systemic Diam: 2.00 cm MV A velocity: 82.70 cm/s MV E/A ratio:  1.09 Mihai Croitoru MD Electronically signed by Sanda Klein MD Signature Date/Time: 01/06/2022/5:33:26 PM    Final      Orvis Brill, DO 01/07/2022, 9:34 AM PGY-1, [place] Intern pager: [phone], text pages welcome

## 2022-01-07 NOTE — Progress Notes (Signed)
ANTICOAGULATION CONSULT NOTE - Follow Up Consult ? ?Pharmacy Consult for heparin ?Indication:  ventricular thrombus ? ?Labs: ?Recent Labs  ?  01/05/22 ?1627 01/05/22 ?1758 01/06/22 ?0146 01/06/22 ?3419 01/06/22 ?0505 01/07/22 ?0336  ?HGB 14.3  --   --   --   --  14.0  ?HCT 41.1  --   --   --   --  39.3  ?PLT 166  --   --   --   --  163  ?HEPARINUNFRC  --   --   --   --   --  0.13*  ?CREATININE 1.24  --  1.28*  --  1.25*  --   ?TROPONINIHS 34* 38* 95* 97*  --   --   ? ? ?Assessment: ?70yo male subtherapeutic on heparin with initial dosing for ventricular thrombus; no infusion issues or signs of bleeding per RN. ? ?Goal of Therapy:  ?Heparin level 0.3-0.7 units/ml ?  ?Plan:  ?Will rebolus with 3000 units and increase heparin infusion by 3 units/kgABW/hr to 1600 units/hr and check level in 6 hours.   ? ?Vernard Gambles, PharmD, BCPS  ?01/07/2022,4:39 AM ? ? ?

## 2022-01-07 NOTE — Progress Notes (Signed)
Progress Note  Patient Name: Raymond [name] Date of Encounter: 01/07/2022  [facility] Cardiologist: None   Subjective   Feeling well.  Breathing much better than on admission.   Inpatient Medications    Scheduled Meds:  aspirin  325 mg Oral Daily   atorvastatin  20 mg Oral q1800   doxazosin  12 mg Oral QHS   furosemide  20 mg Intravenous BID   sertraline  100 mg Oral Daily   spironolactone  25 mg Oral Daily   Continuous Infusions:  heparin 1,650 Units/hr (01/07/22 0724)   PRN Meds: acetaminophen **OR** acetaminophen, albuterol, polyethylene glycol   Vital Signs    Vitals:   01/07/22 0200 01/07/22 0346 01/07/22 0355 01/07/22 0427  BP: (!) 175/94 (!) 190/91 (!) 190/90 (!) 180/70  Pulse: 63 62 (!) 59 61  Resp:  20    Temp:  98.5 F (36.9 C)    TempSrc:  Oral    SpO2:  95%    Weight:  105 kg    Height:        Intake/Output Summary (Last 24 hours) at 01/07/2022 1105 Last data filed at 01/07/2022 0827 Gross per 24 hour  Intake 484.59 ml  Output 1600 ml  Net -1115.41 ml   Last 3 Weights 01/07/2022 01/06/2022 01/05/2022  Weight (lbs) 231 lb 7.7 oz 232 lb 9.4 oz 231 lb 11.2 oz  Weight (kg) 105 kg 105.5 kg 105.098 kg  Some encounter information is confidential and restricted. Go to Review Flowsheets activity to see all data.      Telemetry    Sinus rhythm.  PVCs - Personally Reviewed  ECG    Sinus rhythm.  Rate 63.  Nonspecific T wave abnormalities. - Personally Reviewed  Physical Exam   VS:  BP (!) 166/82    Pulse 66    Temp 98.5 F (36.9 C) (Oral)    Resp 20    Ht 5\' 5"  (1.651 m)    Wt 105 kg    SpO2 96%    BMI 38.52 kg/m  , BMI Body mass index is 38.52 kg/m. GENERAL:  Well appearing HEENT: Pupils equal round and reactive, fundi not visualized, oral mucosa unremarkable NECK:  No jugular venous distention, waveform within normal limits, carotid upstroke brisk and symmetric, no bruits, no thyromegaly LUNGS:  Clear to auscultation bilaterally HEART:  RRR.  PMI  not displaced or sustained,S1 and S2 within normal limits, no S3, no S4, no clicks, no rubs, no murmurs ABD:  Flat, positive bowel sounds normal in frequency in pitch, no bruits, no rebound, no guarding, no midline pulsatile mass, no hepatomegaly, no splenomegaly EXT:  2 plus pulses throughout, no edema, no cyanosis no clubbing SKIN:  No rashes no nodules NEURO:  Cranial nerves II through XII grossly intact, motor grossly intact throughout PSYCH:  Cognitively intact, oriented to person place and time   Labs    High Sensitivity Troponin:   Recent Labs  Lab 01/05/22 1627 01/05/22 1758 01/06/22 0146 01/06/22 0437  TROPONINIHS 34* 38* 95* 97*     Chemistry Recent Labs  Lab 01/06/22 0146 01/06/22 0505 01/07/22 0336  NA 140 141 138  K 3.2* 3.2* 3.5  CL 105 106 104  CO2 27 26 25   GLUCOSE 181* 115* 117*  BUN 12 12 10   CREATININE 1.28* 1.25* 1.34*  CALCIUM 8.6* 8.6* 8.5*  GFRNONAA 59* >60 56*  ANIONGAP 8 9 9     Lipids  Recent Labs  Lab 01/06/22 0146  CHOL 124  TRIG 111  HDL 35*  LDLCALC 67  CHOLHDL 3.5    Hematology Recent Labs  Lab 01/05/22 1627 01/07/22 0336  WBC 5.7 6.5  RBC 4.71 4.65  HGB 14.3 14.0  HCT 41.1 39.3  MCV 87.3 84.5  MCH 30.4 30.1  MCHC 34.8 35.6  RDW 13.5 13.3  PLT 166 163   Thyroid No results for input(s): TSH, FREET4 in the last 168 hours.  BNP Recent Labs  Lab 01/05/22 1748  BNP 1,096.9*    DDimer No results for input(s): DDIMER in the last 168 hours.   Radiology    DG Chest 2 View  Result Date: 01/05/2022 CLINICAL DATA:  chest pain EXAM: CHEST - 2 VIEW COMPARISON:  October 18, 2020 FINDINGS: The cardiomediastinal silhouette is unchanged and enlarged in contour.Cardiac loop recorder. Trace bilateral pleural effusions. No pneumothorax. Perihilar vascular congestion with diffuse interstitial markings. Visualized abdomen is unremarkable. Multilevel degenerative changes of the thoracic spine. IMPRESSION: Constellation of findings are  favored to reflect pulmonary edema with trace bilateral pleural effusions. Differential considerations include atypical infection. Electronically Signed   By: Valentino Saxon M.D.   On: 01/05/2022 16:56   CT Angio Chest PE W and/or Wo Contrast  Result Date: 01/05/2022 CLINICAL DATA:  Nonproductive cough for 2 days. Cough worse today with shortness of breath. EXAM: CT ANGIOGRAPHY CHEST WITH CONTRAST TECHNIQUE: Multidetector CT imaging of the chest was performed using the standard protocol during bolus administration of intravenous contrast. Multiplanar CT image reconstructions and MIPs were obtained to evaluate the vascular anatomy. RADIATION DOSE REDUCTION: This exam was performed according to the departmental dose-optimization program which includes automated exposure control, adjustment of the mA and/or kV according to patient size and/or use of iterative reconstruction technique. CONTRAST:  161mL OMNIPAQUE IOHEXOL 350 MG/ML SOLN COMPARISON:  Current and prior chest radiographs. FINDINGS: Cardiovascular: Pulmonary arteries are well opacified. There is no evidence of a pulmonary embolism. Heart is mildly enlarged. Trace pericardial effusion. Mild left and circumflex coronary artery calcifications. Aorta is not opacified. Is normal in caliber. No atherosclerotic calcifications. Mediastinum/Nodes: No neck base, mediastinal or hilar masses or enlarged lymph nodes. Trachea and esophagus are unremarkable. Lungs/Pleura: Small pleural effusions. Mild interstitial thickening. Mild dependent atelectasis in the lower lobes at the posterior lung bases. Small calcified granuloma in the left upper lobe near the apex. No convincing pneumonia. No pneumothorax. Upper Abdomen: No acute abnormality. Musculoskeletal: No fracture or bone lesion.  No chest wall masses. Review of the MIP images confirms the above findings. IMPRESSION: 1. No evidence of a pulmonary embolism. 2. No convincing pneumonia. 3. Cardiomegaly, small  effusions and mild interstitial thickening supports the chest radiographic impression of mild congestive heart failure. Electronically Signed   By: Lajean Manes M.D.   On: 01/05/2022 18:30   ECHOCARDIOGRAM COMPLETE  Result Date: 01/06/2022    ECHOCARDIOGRAM REPORT   Patient Name:   Raymond [name] Date of Exam: 01/06/2022 Medical Rec #:  [id number]     Height:       65.0 in Accession #:    [id number]    Weight:       232.6 lb Date of Birth:  1951-02-26    BSA:          2.109 m Patient Age:    71 years      BP:           154/81 mmHg Patient Gender: M  HR:           61 bpm. Exam Location:  Inpatient Procedure: 2D Echo Indications:    CHF  History:        Patient has prior history of Echocardiogram examinations, most                 recent 06/24/2017. CHF; Risk Factors:Hypertension and Diabetes.  Sonographer:    Jefferey Pica Referring Phys: [id number] [place]  1. Suspicious for 14 x 5 mm thrombus (broad-based and fixed) at the level of the inferior aspect of the apical left ventricular dyskinesia. Left ventricular ejection fraction, by estimation, is 35 to 40%. The left ventricle has mild to moderately decreased function. The left ventricle demonstrates regional wall motion abnormalities (see scoring diagram/findings for description). The left ventricular internal cavity size was mildly dilated. There is moderate concentric left ventricular hypertrophy. Left ventricular diastolic parameters are consistent with Grade II diastolic dysfunction (pseudonormalization). Elevated left atrial pressure. There is moderate dyskinesis of the left ventricular, apical segment. There is severe hypokinesis of the left ventricular, mid-apical anterior wall and anteroseptal wall.  2. Right ventricular systolic function is normal. The right ventricular size is normal.  3. Left atrial size was mild to moderately dilated.  4. The mitral valve is normal in structure. Mild mitral valve regurgitation. No  evidence of mitral stenosis.  5. The aortic valve is tricuspid. Aortic valve regurgitation is not visualized. No aortic stenosis is present.  6. The inferior vena cava is normal in size with greater than 50% respiratory variability, suggesting right atrial pressure of 3 mmHg. Comparison(s): Prior images unable to be directly viewed, comparison made by report only. FINDINGS  Left Ventricle: Suspicious for 14 x 5 mm thrombus (broad-based and fixed) at the level of the inferior aspect of the apical left ventricular dyskinesia. Left ventricular ejection fraction, by estimation, is 35 to 40%. The left ventricle has mild to moderately decreased function. The left ventricle demonstrates regional wall motion abnormalities. Severe hypokinesis of the left ventricular, mid-apical anterior wall and anteroseptal wall. The left ventricular internal cavity size was mildly dilated. There is moderate concentric left ventricular hypertrophy. Left ventricular diastolic parameters are consistent with Grade II diastolic dysfunction (pseudonormalization). Elevated left atrial pressure.  LV Wall Scoring: The apical septal segment, apical inferior segment, and apex are dyskinetic. The mid and distal anterior wall and mid anteroseptal segment are hypokinetic. Right Ventricle: The right ventricular size is normal. No increase in right ventricular wall thickness. Right ventricular systolic function is normal. Left Atrium: Left atrial size was mild to moderately dilated. Right Atrium: Right atrial size was normal in size. Pericardium: Trivial pericardial effusion is present. Mitral Valve: The mitral valve is normal in structure. Mild mitral valve regurgitation. No evidence of mitral valve stenosis. Tricuspid Valve: The tricuspid valve is normal in structure. Tricuspid valve regurgitation is not demonstrated. No evidence of tricuspid stenosis. Aortic Valve: The aortic valve is tricuspid. Aortic valve regurgitation is not visualized. No aortic  stenosis is present. Aortic valve peak gradient measures 4.9 mmHg. Pulmonic Valve: The pulmonic valve was normal in structure. Pulmonic valve regurgitation is not visualized. No evidence of pulmonic stenosis. Aorta: The aortic root is normal in size and structure. Venous: The inferior vena cava is normal in size with greater than 50% respiratory variability, suggesting right atrial pressure of 3 mmHg. IAS/Shunts: No atrial level shunt detected by color flow Doppler.  LEFT VENTRICLE PLAX 2D LVIDd:  5.80 cm   Diastology LVIDs:         4.40 cm   LV e' medial:    4.80 cm/s LV PW:         1.70 cm   LV E/e' medial:  18.8 LV IVS:        1.50 cm   LV e' lateral:   5.33 cm/s LVOT diam:     2.00 cm   LV E/e' lateral: 16.9 LV SV:         51 LV SV Index:   24 LVOT Area:     3.14 cm  RIGHT VENTRICLE             IVC RV S prime:     13.70 cm/s  IVC diam: 2.00 cm TAPSE (M-mode): 2.9 cm LEFT ATRIUM             Index LA diam:        4.40 cm 2.09 cm/m LA Vol (A2C):   79.0 ml 37.43 ml/m LA Vol (A4C):   79.9 ml 37.88 ml/m LA Biplane Vol: 95.6 ml 45.33 ml/m  AORTIC VALVE                 PULMONIC VALVE AV Area (Vmax): 2.46 cm     PV Vmax:       0.95 m/s AV Vmax:        110.50 cm/s  PV Peak grad:  3.6 mmHg AV Peak Grad:   4.9 mmHg LVOT Vmax:      86.50 cm/s LVOT Vmean:     54.500 cm/s LVOT VTI:       0.163 m  AORTA Ao Root diam: 3.80 cm Ao Asc diam:  3.10 cm MITRAL VALVE MV Area (PHT): 4.17 cm    SHUNTS MV Decel Time: 182 msec    Systemic VTI:  0.16 m MV E velocity: 90.30 cm/s  Systemic Diam: 2.00 cm MV A velocity: 82.70 cm/s MV E/A ratio:  1.09 Mihai Croitoru MD Electronically signed by Sanda Klein MD Signature Date/Time: 01/06/2022/5:33:26 PM    Final     Cardiac Studies   Echo 01/07/22:  1. Suspicious for 14 x 5 mm thrombus (broad-based and fixed) at the level  of the inferior aspect of the apical left ventricular dyskinesia. Left  ventricular ejection fraction, by estimation, is 35 to 40%. The left  ventricle has  mild to moderately  decreased function. The left ventricle demonstrates regional wall motion  abnormalities (see scoring diagram/findings for description). The left  ventricular internal cavity size was mildly dilated. There is moderate  concentric left ventricular  hypertrophy. Left ventricular diastolic parameters are consistent with  Grade II diastolic dysfunction (pseudonormalization). Elevated left atrial  pressure. There is moderate dyskinesis of the left ventricular, apical  segment. There is severe hypokinesis  of the left ventricular, mid-apical anterior wall and anteroseptal wall.   2. Right ventricular systolic function is normal. The right ventricular  size is normal.   3. Left atrial size was mild to moderately dilated.   4. The mitral valve is normal in structure. Mild mitral valve  regurgitation. No evidence of mitral stenosis.   5. The aortic valve is tricuspid. Aortic valve regurgitation is not  visualized. No aortic stenosis is present.   6. The inferior vena cava is normal in size with greater than 50%  respiratory variability, suggesting right atrial pressure of 3 mmHg.   Patient Profile     71 y.o. male with diabetes, obesity, hypertension,  stroke, PAF, hyperlipidemia, and OSA on CPAP admitted with acute systolic and diastolic heart failure.  Assessment & Plan    #NSTEMI: # Hyperlipidemia: Patient likely had an out of hospital MI that presented mostly as indigestion.  Echo now with acute systolic and diastolic heart failure.  Switch aspirin to 81mg , especially given that he will need anticoagulation for LV thrombus.  Add low dose carvedilol and continue atorvastatin.  #Acute systolic diastolic heart failure: Echo this admission with LVEF 35-40% down from 45% previously.  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  Apical thrombus on echo.  Planning for [facility] as above.    # LV thrombus:  Continue heparin.  Transition to warfarin post cath presuming he doesn't need  CABG.  # Hypertension:  BP very elevated, though was reportedly normal with PCP a few weeks ago.  Losartan held.  Will start [facility] tomorrow and very low dose carvedilol given bradycardia. Continue dozazosin for now with consideration of a different alpha blocker per Dr. Loletha Grayer.   # Obesity:  Consider Ozempic.  Needs cardiac rehab at discharge.   # DM:  Will need Jardiance/Farxiga given new HF.      For questions or updates, please contact [place] Please consult [email or web address] for contact info under        Signed, Skeet Latch, MD  01/07/2022, 11:05 AM

## 2022-01-07 NOTE — Consult Note (Addendum)
[address]       [place] [address]             [phone]        [name] Medical Record [id number] Date of Birth: 12-Aug-1951  Referring: Lorretta Harp, MD Primary Care: Charleston Poot, MD Primary Cardiologist:None  Reason for consult:   Critical left main and single-vessel coronary artery disease presenting with acute non-ST elevation myocardial infarction and combined systolic/diastolic heart failure.   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  He presented to the emergency room this past 2 days ago with a primary complaint of shortness of breath that had been ongoing and intensifying over the previous week.  Been seen earlier by his primary care physician and started on an antibiotic orally for suspected pneumonia.  In the emergency room, high-sensitivity troponin was initially measured at 34 and steadily rose to 97 over the next several hours.  CBC and CMP were unrevealing.  The BNP was elevated at 1070.  Chest x-ray showed trace bilateral pleural effusions but was otherwise unremarkable.  EKG showed normal sinus rhythm with no acute changes.  He had a CTA of the chest that showed no evidence of pulmonary embolus but also confirmed the presence of small bilateral pleural effusions.  There was no evidence of pneumonia on CT scan.  He was admitted to the hospital with suspicion of congestive heart failure.  He was diuresed with Lasix and also was started on spironolactone and losartan.  After admission, he had an echocardiogram that showed left ventricular function was decreased to 35 to 40% compared to previous study a few years earlier where his ejection fraction was 45 to 50%.  Study also revealed grade 2 diastolic dysfunction, severe  anteroapical and anteroseptal hypokinesis.  There was mild mitral insufficiency but no aortic insufficiency or aortic stenosis.  There was a 14 mm x 5 mm LV thrombus that appeared to be broad-based and fixed.  Given these findings, the patient was started on IV heparin.  Cardiology was consulted.  The patient was seen by Dr. Skeet Latch.  Raymond [name] symptoms improved with the medical management.   Further work-up with left heart catheterization was recommended and was accomplished earlier today.  This study demonstrates a 95% proximal left main stenosis.  The LAD is occluded at the middle segment just after the second diagonal.  There are right to left collaterals.  The second diagonal coronary artery had a 95% stenosis.  The right coronary artery was large and dominant and free of any obstructive disease. CT surgery has been asked to evaluate Raymond [name] for consideration of coronary bypass grafting.  Raymond [name] is currently resting quietly in his bed on 3 E.  His wife and daughter are at the bedside.  He denies having any chest pain or shortness of breath presently.  He denies having any dental issues.  Last dental visit was a few years ago.  He is right-hand dominant. 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  In regards to the stroke, Raymond [name] reports he had right-sided hemiparesis and loss of hearing of sudden onset in 2018.  Work-up was essentially unremarkable and  included an event recorder which he still has inserted in his left anterior chest wall.  After a course of physical therapy, the only residual neurologic deficits were the continued hearing loss and  a tremor in his right hand.  Current Activity/ Functional Status: Patient is independent with mobility/ambulation, transfers, ADL's, IADL's.   Zubrod Score: At the time of surgery this patients most appropriate activity status/level should be  described as: []     0    Normal activity, no symptoms []     1    Restricted in physical strenuous activity but ambulatory, able to do out light work []     2    Ambulatory and capable of self care, unable to do work activities, up and about                 more than 50%  Of the time                            [x]     3    Only limited self care, in bed greater than 50% of waking hours []     4    Completely disabled, no self care, confined to bed or chair []     5    Moribund  Past Medical History:  Diagnosis Date   Arthritis    Depression    Diabetes mellitus type 2 in obese ([place])    Hypertension    Migraines    Sleep apnea    Stroke [facility])     Past Surgical History:  Procedure Laterality Date   CHOLECYSTECTOMY     KNEE ARTHROSCOPY     LOOP RECORDER INSERTION N/A 06/24/2017   Procedure: LOOP RECORDER INSERTION;  Surgeon: Thompson Grayer, MD;  Location: [facility];  Service: Cardiovascular;  Laterality: N/A;   ROTATOR CUFF REPAIR     TEE WITHOUT CARDIOVERSION N/A 06/24/2017   Procedure: TRANSESOPHAGEAL ECHOCARDIOGRAM (TEE) WITH LOOP;  Surgeon: Thayer Headings, MD;  Location: [facility];  Service: Cardiovascular;  Laterality: N/A;    Social History   Tobacco Use  Smoking Status Light Smoker   Types: Cigars  Smokeless Tobacco Never  Tobacco Comments   chew on cigars its not lit    Social History   Substance and Sexual Activity  Alcohol Use No   Alcohol/week: 0.0 standard drinks     Allergies  Allergen Reactions   Flomax [Tamsulosin Hcl] Other (See Comments)    HYPOtension and syncope    Current Facility-Administered Medications  Medication Dose Route Frequency Provider Last Rate Last Admin   0.9 %  sodium chloride infusion   Intravenous Continuous Lorretta Harp, MD 75 mL/hr at 01/07/22 1416 New Bag at 01/07/22 1416   0.9 %  sodium chloride infusion  250 mL Intravenous PRN Lorretta Harp, MD       acetaminophen (TYLENOL) tablet 650 mg  650 mg Oral Q4H  PRN Lorretta Harp, MD       albuterol (PROVENTIL) (2.5 MG/3ML) 0.083% nebulizer solution 2.5 mg  2.5 mg Nebulization Q4H PRN Lorretta Harp, MD   2.5 mg at 01/06/22 1112   [START ON 01/08/2022] aspirin chewable tablet 81 mg  81 mg Oral Daily Lorretta Harp, MD       atorvastatin (LIPITOR) tablet 20 mg  20 mg Oral q1800 Lorretta Harp, MD   20 mg at 01/06/22 1744   carvedilol (COREG) tablet 3.125 mg  3.125 mg Oral BID WC Lorretta Harp, MD       doxazosin (CARDURA) tablet 12 mg  12 mg Oral QHS Lorretta Harp, MD   12 mg at 01/06/22 2123   furosemide (LASIX) injection 20 mg  20 mg Intravenous BID Lorretta Harp, MD   20 mg at 01/07/22 0553   heparin ADULT infusion 100 units/mL (25000 units/274mL)  1,650 Units/hr Intravenous Continuous Caren Macadam, Student-PharmD       hydrALAZINE (APRESOLINE) injection 10 mg  10 mg Intravenous Q20 Min PRN Lorretta Harp, MD       labetalol (NORMODYNE) injection 10 mg  10 mg Intravenous Q10 min PRN Lorretta Harp, MD       morphine (PF) 2 MG/ML injection 2 mg  2 mg Intravenous Q1H PRN Lorretta Harp, MD       ondansetron [facility]) injection 4 mg  4 mg Intravenous Q6H PRN Lorretta Harp, MD       polyethylene glycol (MIRALAX / GLYCOLAX) packet 17 g  17 g Oral Daily PRN Lorretta Harp, MD       [START ON 01/08/2022] sacubitril-valsartan (ENTRESTO) 49-51 mg per tablet  1 tablet Oral BID Lorretta Harp, MD       sertraline (ZOLOFT) tablet 100 mg  100 mg Oral Daily Lorretta Harp, MD   100 mg at 01/07/22 1002   sodium chloride flush (NS) 0.9 % injection 3 mL  3 mL Intravenous Q12H Lorretta Harp, MD       sodium chloride flush (NS) 0.9 % injection 3 mL  3 mL Intravenous PRN Lorretta Harp, MD       spironolactone (ALDACTONE) tablet 25 mg  25 mg Oral Daily Lorretta Harp, MD   25 mg at 01/07/22 1002    Medications Prior to Admission  Medication Sig Dispense Refill Last Dose   acetaminophen (TYLENOL) 500 MG tablet Take  2 tablets (1,000 mg total) by mouth every 6 (six) hours as needed. (Patient taking differently: Take 1,000 mg by mouth every 6 (six) hours as needed for mild pain.) 30 tablet 0 Past Month   aspirin 325 MG tablet Take 325 mg by mouth daily.   01/05/2022   atorvastatin (LIPITOR) 20 MG tablet Take 1 tablet (20 mg total) by mouth daily at 6 PM. 30 tablet 0 01/05/2022   Dextromethorphan-guaiFENesin (MUCUS RELIEF DM MAX PO) Take 20 mLs by mouth every 4 (four) hours as needed (cough).   01/04/2022   doxazosin (CARDURA) 8 MG tablet Take 12 mg by mouth at bedtime.   01/04/2022   doxycycline (VIBRAMYCIN) 100 MG capsule Take 100 mg by mouth See admin instructions. Bid x 7 days   01/05/2022   ibuprofen (ADVIL) 200 MG tablet Take 400 mg by mouth every 6 (six) hours as needed for headache or moderate pain.   Past Month   levocetirizine (XYZAL) 5 MG tablet Take 5 mg by mouth daily as needed for allergies.   Past Month   losartan (COZAAR) 100 MG tablet Take 100 mg by mouth daily.   01/05/2022   metFORMIN (GLUCOPHAGE) 500 MG tablet Take 1 tablet (500 mg total) by mouth at bedtime. (Patient taking differently: Take 500 mg by mouth 2 (two) times daily with a meal.)   01/05/2022   sertraline (ZOLOFT) 100 MG tablet Take 100 mg by mouth daily.    01/05/2022    Family History  Problem Relation Age of Onset   Other Mother  reports in good health   Other Father        was estranged from family, unknown medical history     Review of Systems:       Cardiac Review of Systems: Y or  [    ]= no  Chest Pain [    ]  Resting SOB [ x  ] Exertional SOB  [ x ]  Orthopnea [  xx]   Pedal Edema [   ]    Palpitations [  ] Syncope  [  ]   Presyncope [   ]  General Review of Systems: [Y] = yes [  ]=no Constitional: recent weight change [  ]; anorexia [  ]; fatigue [  ]; nausea [  ]; night sweats [  ]; fever [  ]; or chills [  ]                                                               Dental: Last Dentist visit: "A few years  ago"  Eye : blurred vision [  ]; diplopia [   ]; vision changes [  ];  Amaurosis fugax[  ]; Resp: cough [ x ];  wheezing[  ];  hemoptysis[  ]; shortness of breath[ x ]; paroxysmal nocturnal dyspnea[  ]; dyspnea on exertion[ x ]; or orthopnea[ x];  GI:  gallstones[  ], vomiting[  ];  dysphagia[  ]; melena[  ];  hematochezia [  ]; heartburn[  ];   Hx of  Colonoscopy[  ]; GU: kidney stones [  ]; hematuria[  ];   dysuria [  ];  nocturia[  ];  history of     obstruction [  ]; urinary frequency [  ]             Skin: rash, swelling[  ];, hair loss[  ];  peripheral edema[  ];  or itching[  ]; Musculosketetal: myalgias[  ];  joint swelling[  ];  joint erythema[  ];  joint pain[  ];  back pain[  ];  Heme/Lymph: bruising[  ];  bleeding[  ];  anemia[  ];  Neuro: TIA[  ];  headaches[  ];  stroke[  ];  vertigo[  ];  seizures[  ];   paresthesias[  ];  difficulty walking[  ];  Psych:depression[  ]; anxiety[  ];  Endocrine: diabetes[ x ];  thyroid dysfunction[  ];                  Physical Exam: BP (!) 166/82    Pulse 66    Temp 98.5 F (36.9 C) (Oral)    Resp 20    Ht 5\' 5"  (1.651 m)    Wt 105 kg    SpO2 100%    BMI 38.52 kg/m    General appearance: alert, cooperative, and no distress Head: Normocephalic, without obvious abnormality, atraumatic Neck: no adenopathy, no carotid bruit, no JVD, and supple, symmetrical, trachea midline Lymph nodes: No cervical or clavicular adenopathy Resp: Breath sounds are clear, full, Cardio: Regular rate and rhythm, monitor shows normal sinus rhythm.  No murmurs. GI: Obese but soft, active bowel sounds.  No tenderness, no organomegaly. Extremities: No deformities.  A TR band in place over the right radial artery.  There is a strong palpable pulse distal to this.  All peripheral pulses are present and easily palpable.  He has no lower extremity edema at this time.  No evidence of varicosities. Neurologic: Grossly normal except for the hearing loss noted during the  interview as well as a tremor in the right upper extremity.  Diagnostic Studies & Laboratory data:    CORONARY ANGIOGRAPHY   Conclusion      Ost LM to Mid LM lesion is 95% stenosed.   Mid LAD lesion is 100% stenosed.   2nd Diag lesion is 95% stenosed.   Raymond [name] is a 71 y.o. male      [id number] LOCATION:  FACILITY: [facility]  PHYSICIAN: Quay Burow, M.D. [birth year]     DATE OF PROCEDURE:  01/07/2022   DATE OF DISCHARGE:       CARDIAC CATHETERIZATION        History obtained from chart review.71 y.o. male with diabetes, obesity, hypertension, stroke, PAF, hyperlipidemia, and OSA on CPAP admitted with acute systolic and diastolic heart failure.  By history, it appears he had an out of hospital anterior MI proxy a week ago.  His EF is in the 25 to 30% range.  He was diuresed and medically optimized.  He is now able to lie flat.  He said no further chest pain.  He does have segmental wall motion abnormalities consistent with myocardial infarction in the LAD territory.  He presents now for diagnostic coronary angiography.  Of note, he did have an apical mural thrombus by [facility] echocardiography.     IMPRESSION: Raymond [name] has high-grade distal left main disease with occluded mid LAD, grade 2-3 right to left collaterals and moderately severe LV dysfunction for apical mural thrombus.  It is possible that he is still has viability in the LAD territory given his significant collaterals.  He will need coronary artery bypass grafting.  I will restart heparin 4 hours after sheath removal.  The radial sheath was removed and a TR band was placed on the right wrist to achieve patent hemostasis.  The patient left lab in stable condition.  T CTS has been notified.   Quay Burow. MD, [facility] 01/07/2022 [id number] PM     Complications documented before study signed (01/07/2022  [id number] PM)   No complications were associated with this study.  Documented by Emeline General A - 01/07/2022  1:43 PM      Coronary Findings  Diagnostic Dominance: Right Left Main  Ost LM to Mid LM lesion is 95% stenosed.    Left Anterior Descending  Collaterals  Dist LAD filled by collaterals from RPDA.    Mid LAD lesion is 100% stenosed.    Second Diagonal Branch  2nd Diag lesion is 95% stenosed.    Intervention   No interventions have been documented.   Coronary Diagrams   Diagnostic Dominance: Right      ECHOCARDIOGRAM REPORT         Patient Name:   Raymond [name] Date of Exam: 01/06/2022  Medical Rec #:  [id number]     Height:       65.0 in  Accession #:    [id number]    Weight:       232.6 lb  Date of Birth:  February 06, 1951    BSA:          2.109 m  Patient Age:    30 years      BP:  154/81 mmHg  Patient Gender: M             HR:           61 bpm.  Exam Location:  Inpatient   Procedure: 2D Echo   Indications:    CHF     History:        Patient has prior history of Echocardiogram examinations,  most                  recent 06/24/2017. CHF; Risk Factors:Hypertension and  Diabetes.     Sonographer:    Jefferey Pica  Referring Phys: [id number] [place]     1. Suspicious for 14 x 5 mm thrombus (broad-based and fixed) at the level  of the inferior aspect of the apical left ventricular dyskinesia. Left  ventricular ejection fraction, by estimation, is 35 to 40%. The left  ventricle has mild to moderately  decreased function. The left ventricle demonstrates regional wall motion  abnormalities (see scoring diagram/findings for description). The left  ventricular internal cavity size was mildly dilated. There is moderate  concentric left ventricular  hypertrophy. Left ventricular diastolic parameters are consistent with  Grade II diastolic dysfunction (pseudonormalization). Elevated left atrial  pressure. There is moderate dyskinesis of the left ventricular, apical  segment. There is severe hypokinesis  of the left ventricular, mid-apical  anterior wall and anteroseptal wall.   2. Right ventricular systolic function is normal. The right ventricular  size is normal.   3. Left atrial size was mild to moderately dilated.   4. The mitral valve is normal in structure. Mild mitral valve  regurgitation. No evidence of mitral stenosis.   5. The aortic valve is tricuspid. Aortic valve regurgitation is not  visualized. No aortic stenosis is present.   6. The inferior vena cava is normal in size with greater than 50%  respiratory variability, suggesting right atrial pressure of 3 mmHg.   Comparison(s): Prior images unable to be directly viewed, comparison made  by report only.   FINDINGS   Left Ventricle: Suspicious for 14 x 5 mm thrombus (broad-based and fixed)  at the level of the inferior aspect of the apical left ventricular  dyskinesia. Left ventricular ejection fraction, by estimation, is 35 to  40%. The left ventricle has mild to  moderately decreased function. The left ventricle demonstrates regional  wall motion abnormalities. Severe hypokinesis of the left ventricular,  mid-apical anterior wall and anteroseptal wall. The left ventricular  internal cavity size was mildly dilated.  There is moderate concentric left ventricular hypertrophy. Left  ventricular diastolic parameters are consistent with Grade II diastolic  dysfunction (pseudonormalization). Elevated left atrial pressure.      LV Wall Scoring:  The apical septal segment, apical inferior segment, and apex are  dyskinetic.  The mid and distal anterior wall and mid anteroseptal segment are  hypokinetic.   Right Ventricle: The right ventricular size is normal. No increase in  right ventricular wall thickness. Right ventricular systolic function is  normal.   Left Atrium: Left atrial size was mild to moderately dilated.   Right Atrium: Right atrial size was normal in size.   Pericardium: Trivial pericardial effusion is present.   Mitral Valve: The mitral  valve is normal in structure. Mild mitral valve  regurgitation. No evidence of mitral valve stenosis.   Tricuspid Valve: The tricuspid valve is normal in structure. Tricuspid  valve regurgitation is not demonstrated. No evidence of tricuspid  stenosis.  Aortic Valve: The aortic valve is tricuspid. Aortic valve regurgitation is  not visualized. No aortic stenosis is present. Aortic valve peak gradient  measures 4.9 mmHg.   Pulmonic Valve: The pulmonic valve was normal in structure. Pulmonic valve  regurgitation is not visualized. No evidence of pulmonic stenosis.   Aorta: The aortic root is normal in size and structure.   Venous: The inferior vena cava is normal in size with greater than 50%  respiratory variability, suggesting right atrial pressure of 3 mmHg.   IAS/Shunts: No atrial level shunt detected by color flow Doppler.      LEFT VENTRICLE  PLAX 2D  LVIDd:         5.80 cm   Diastology  LVIDs:         4.40 cm   LV e' medial:    4.80 cm/s  LV PW:         1.70 cm   LV E/e' medial:  18.8  LV IVS:        1.50 cm   LV e' lateral:   5.33 cm/s  LVOT diam:     2.00 cm   LV E/e' lateral: 16.9  LV SV:         51  LV SV Index:   24  LVOT Area:     3.14 cm      RIGHT VENTRICLE             IVC  RV S prime:     13.70 cm/s  IVC diam: 2.00 cm  TAPSE (M-mode): 2.9 cm   LEFT ATRIUM             Index  LA diam:        4.40 cm 2.09 cm/m  LA Vol (A2C):   79.0 ml 37.43 ml/m  LA Vol (A4C):   79.9 ml 37.88 ml/m  LA Biplane Vol: 95.6 ml 45.33 ml/m   AORTIC VALVE                 PULMONIC VALVE  AV Area (Vmax): 2.46 cm     PV Vmax:       0.95 m/s  AV Vmax:        110.50 cm/s  PV Peak grad:  3.6 mmHg  AV Peak Grad:   4.9 mmHg  LVOT Vmax:      86.50 cm/s  LVOT Vmean:     54.500 cm/s  LVOT VTI:       0.163 m     AORTA  Ao Root diam: 3.80 cm  Ao Asc diam:  3.10 cm   MITRAL VALVE  MV Area (PHT): 4.17 cm    SHUNTS  MV Decel Time: 182 msec    Systemic VTI:  0.16 m  MV E velocity:  90.30 cm/s  Systemic Diam: 2.00 cm  MV A velocity: 82.70 cm/s  MV E/A ratio:  1.09   Mihai Croitoru MD  Electronically signed by Sanda Klein MD  Signature Date/Time: 01/06/2022/5:33:26 PM    Recent Radiology Findings:   DG Chest 2 View  Result Date: 01/05/2022 CLINICAL DATA:  chest pain EXAM: CHEST - 2 VIEW COMPARISON:  October 18, 2020 FINDINGS: The cardiomediastinal silhouette is unchanged and enlarged in contour.Cardiac loop recorder. Trace bilateral pleural effusions. No pneumothorax. Perihilar vascular congestion with diffuse interstitial markings. Visualized abdomen is unremarkable. Multilevel degenerative changes of the thoracic spine. IMPRESSION: Constellation of findings are favored to reflect pulmonary edema with trace bilateral pleural effusions. Differential considerations include  atypical infection. Electronically Signed   By: Valentino Saxon M.D.   On: 01/05/2022 16:56   CLINICAL DATA:  Nonproductive cough for 2 days. Cough worse today with shortness of breath.   EXAM: CT ANGIOGRAPHY CHEST WITH CONTRAST   TECHNIQUE: Multidetector CT imaging of the chest was performed using the standard protocol during bolus administration of intravenous contrast. Multiplanar CT image reconstructions and MIPs were obtained to evaluate the vascular anatomy.   RADIATION DOSE REDUCTION: This exam was performed according to the departmental dose-optimization program which includes automated exposure control, adjustment of the mA and/or kV according to patient size and/or use of iterative reconstruction technique.   CONTRAST:  167mL OMNIPAQUE IOHEXOL 350 MG/ML SOLN   COMPARISON:  Current and prior chest radiographs.   FINDINGS: Cardiovascular: Pulmonary arteries are well opacified. There is no evidence of a pulmonary embolism.   Heart is mildly enlarged. Trace pericardial effusion. Mild left and circumflex coronary artery calcifications. Aorta is not opacified. Is normal in  caliber. No atherosclerotic calcifications.   Mediastinum/Nodes: No neck base, mediastinal or hilar masses or enlarged lymph nodes. Trachea and esophagus are unremarkable.   Lungs/Pleura: Small pleural effusions.   Mild interstitial thickening. Mild dependent atelectasis in the lower lobes at the posterior lung bases. Small calcified granuloma in the left upper lobe near the apex. No convincing pneumonia. No pneumothorax.   Upper Abdomen: No acute abnormality.   Musculoskeletal: No fracture or bone lesion.  No chest wall masses.   Review of the MIP images confirms the above findings.   IMPRESSION: 1. No evidence of a pulmonary embolism. 2. No convincing pneumonia. 3. Cardiomegaly, small effusions and mild interstitial thickening supports the chest radiographic impression of mild congestive heart failure.     Electronically Signed   By: Lajean Manes M.D.   On: 01/05/2022 18:30         I have independently reviewed the above radiologic studies and discussed with the patient   Recent Lab Findings: Lab Results  Component Value Date   WBC 6.5 01/07/2022   HGB 14.0 01/07/2022   HCT 39.3 01/07/2022   PLT 163 01/07/2022   GLUCOSE 117 (H) 01/07/2022   CHOL 124 01/06/2022   TRIG 111 01/06/2022   HDL 35 (L) 01/06/2022   LDLCALC 67 01/06/2022   ALT 22 07/10/2017   AST 20 07/10/2017   NA 138 01/07/2022   K 3.5 01/07/2022   CL 104 01/07/2022   CREATININE 1.34 (H) 01/07/2022   BUN 10 01/07/2022   CO2 25 01/07/2022   TSH 0.643 07/11/2017   INR 0.98 07/10/2017   HGBA1C 5.2 01/06/2022      Assessment / Plan:      -71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  Ejection fraction is 35 to 40%.  Symptomatically, he is much improved since admission following diuresis and medical therapy for his heart failure.  We agree that  coronary bypass grafting is his best option for revascularization.  The LAD appears to be chronically totally occluded.  Reconstitutes by collaterals from the right.  The vessel appears quite small.  The circumflex coronary artery appears to be of reasonable size.  The RCA has no obstructive disease.  I described the process of coronary bypass grafting and the expected perioperative course to Raymond [name] and his family.  They would like for [place] to proceed with the work-up and planning in order to  have surgery as soon as possible.  Dr. Kipp Brood will review the angiography and see Raymond [name].  Further details regarding timing of surgery will follow.  -Type 2 diabetes mellitus-hemoglobin A1c was 5.2 at the time of admission.  He was taking metformin prior to admission.  -Hypertension-controlled much better since admission.  He is currently on carvedilol, Entresto, and spironolactone.  -History of CVA-the source of the stroke was never identified.  We will obtain carotid Dopplers prior to surgery.  He has an event recorder in place but apparently the battery has been depleted.  According to his family, he did not have any arrhythmias that would have precipitated an embolic event.   I  spent 30 minutes counseling the patient face to face.  Antony Odea, PA-C  01/07/2022 3:24 PM  Agree with above.  This is a 71 year old gentleman this admitted following an NSTEMI along with congestive heart failure.  His echocardiogram also shows a possible LV thrombus.  The risks and benefits of surgical revascularization were discussed and he is agreeable to proceed.  He is scheduled for 01/08/2022.  Lainie Daubert Bary Leriche

## 2022-01-07 NOTE — Progress Notes (Signed)
Heart Failure Nurse Navigator Progress Note ? ?Visited patient/family to answer any questions regarding HF education and plan of care. Emphasized TCTS consulted today. Pt aware of consult and plan for potential CABG. Concern regarding if he will go home prior to surgery--explained TCTS team will guide with the plan. Other questions answered. Pt states he was driving prior to admission, walking independently. Will be assessed by Cardiac Rehab team--spouse asking about increasing exercise.  ? ?Will plan to assess patient s/p CABG to set up for [facility] clinic for HF related issues. Will continue education closer to DC date to limit overwhelming patient at this time.  ? ?ECHO: 35-40% ? ?Cath: 25-30% by visual estimate.  ? ?Ozella Rocks, MSN, RN ?Heart Failure Nurse Navigator ?[phone] ? ?

## 2022-01-08 ENCOUNTER — Inpatient Hospital Stay (HOSPITAL_COMMUNITY): Payer: Medicare Other

## 2022-01-08 ENCOUNTER — Inpatient Hospital Stay (HOSPITAL_COMMUNITY): Payer: Medicare Other | Admitting: Certified Registered"

## 2022-01-08 ENCOUNTER — Inpatient Hospital Stay (HOSPITAL_COMMUNITY)
Admission: EM | Disposition: A | Discharge status: Home Health | Payer: Self-pay | Source: Home / Self Care | Attending: Thoracic Surgery (Cardiothoracic Vascular Surgery)

## 2022-01-08 ENCOUNTER — Encounter (HOSPITAL_COMMUNITY): Payer: Self-pay | Admitting: Cardiovascular Disease

## 2022-01-08 DIAGNOSIS — I5041 Acute combined systolic (congestive) and diastolic (congestive) heart failure: Secondary | ICD-10-CM | POA: Diagnosis not present

## 2022-01-08 DIAGNOSIS — I2510 Atherosclerotic heart disease of native coronary artery without angina pectoris: Secondary | ICD-10-CM | POA: Diagnosis present

## 2022-01-08 DIAGNOSIS — Z01810 Encounter for preprocedural cardiovascular examination: Secondary | ICD-10-CM | POA: Diagnosis not present

## 2022-01-08 DIAGNOSIS — E7800 Pure hypercholesterolemia, unspecified: Secondary | ICD-10-CM

## 2022-01-08 DIAGNOSIS — Z951 Presence of aortocoronary bypass graft: Secondary | ICD-10-CM

## 2022-01-08 HISTORY — PX: TEE WITHOUT CARDIOVERSION: SHX5443

## 2022-01-08 HISTORY — PX: CORONARY ARTERY BYPASS GRAFT: SHX141

## 2022-01-08 HISTORY — PX: ENDOVEIN HARVEST OF GREATER SAPHENOUS VEIN: SHX5059

## 2022-01-08 LAB — POCT I-STAT 7, (LYTES, BLD GAS, ICA,H+H)
Acid-Base Excess: 3 mmol/L — ABNORMAL HIGH (ref 0.0–2.0)
Acid-Base Excess: 1 mmol/L (ref 0.0–2.0)
Acid-base deficit: 2 mmol/L (ref 0.0–2.0)
Acid-base deficit: 2 mmol/L (ref 0.0–2.0)
Bicarbonate: 25.6 mmol/L (ref 20.0–28.0)
Bicarbonate: 27.8 mmol/L (ref 20.0–28.0)
Bicarbonate: 22.5 mmol/L (ref 20.0–28.0)
Bicarbonate: 25.3 mmol/L (ref 20.0–28.0)
Calcium, Ion: 1.24 mmol/L (ref 1.15–1.40)
Calcium, Ion: 1.01 mmol/L — ABNORMAL LOW (ref 1.15–1.40)
Calcium, Ion: 1.06 mmol/L — ABNORMAL LOW (ref 1.15–1.40)
Calcium, Ion: 1.26 mmol/L (ref 1.15–1.40)
HCT: 35 % — ABNORMAL LOW (ref 39.0–52.0)
HCT: 26 % — ABNORMAL LOW (ref 39.0–52.0)
HCT: 26 % — ABNORMAL LOW (ref 39.0–52.0)
HCT: 26 % — ABNORMAL LOW (ref 39.0–52.0)
Hemoglobin: 11.9 g/dL — ABNORMAL LOW (ref 13.0–17.0)
Hemoglobin: 8.8 g/dL — ABNORMAL LOW (ref 13.0–17.0)
Hemoglobin: 8.8 g/dL — ABNORMAL LOW (ref 13.0–17.0)
Hemoglobin: 8.8 g/dL — ABNORMAL LOW (ref 13.0–17.0)
O2 Saturation: 98 %
O2 Saturation: 100 %
O2 Saturation: 100 %
O2 Saturation: 95 %
Potassium: 3.4 mmol/L — ABNORMAL LOW (ref 3.5–5.1)
Potassium: 3.7 mmol/L (ref 3.5–5.1)
Potassium: 3.9 mmol/L (ref 3.5–5.1)
Potassium: 4.5 mmol/L (ref 3.5–5.1)
Sodium: 139 mmol/L (ref 135–145)
Sodium: 140 mmol/L (ref 135–145)
Sodium: 139 mmol/L (ref 135–145)
Sodium: 140 mmol/L (ref 135–145)
TCO2: 27 mmol/L (ref 22–32)
TCO2: 29 mmol/L (ref 22–32)
TCO2: 24 mmol/L (ref 22–32)
TCO2: 26 mmol/L (ref 22–32)
pCO2 arterial: 53.6 mmHg — ABNORMAL HIGH (ref 32–48)
pCO2 arterial: 42.4 mmHg (ref 32–48)
pCO2 arterial: 37 mmHg (ref 32–48)
pCO2 arterial: 37.1 mmHg (ref 32–48)
pH, Arterial: 7.287 — ABNORMAL LOW (ref 7.35–7.45)
pH, Arterial: 7.424 (ref 7.35–7.45)
pH, Arterial: 7.391 (ref 7.35–7.45)
pH, Arterial: 7.442 (ref 7.35–7.45)
pO2, Arterial: 120 mmHg — ABNORMAL HIGH (ref 83–108)
pO2, Arterial: 368 mmHg — ABNORMAL HIGH (ref 83–108)
pO2, Arterial: 262 mmHg — ABNORMAL HIGH (ref 83–108)
pO2, Arterial: 74 mmHg — ABNORMAL LOW (ref 83–108)

## 2022-01-08 LAB — CBC
HCT: 38.6 % — ABNORMAL LOW (ref 39.0–52.0)
HCT: 20.5 % — ABNORMAL LOW (ref 39.0–52.0)
HCT: 33.3 % — ABNORMAL LOW (ref 39.0–52.0)
Hemoglobin: 13.7 g/dL (ref 13.0–17.0)
Hemoglobin: 6.9 g/dL — CL (ref 13.0–17.0)
Hemoglobin: 11.6 g/dL — ABNORMAL LOW (ref 13.0–17.0)
MCH: 29.9 pg (ref 26.0–34.0)
MCH: 30.4 pg (ref 26.0–34.0)
MCH: 30.8 pg (ref 26.0–34.0)
MCHC: 35.5 g/dL (ref 30.0–36.0)
MCHC: 33.7 g/dL (ref 30.0–36.0)
MCHC: 34.8 g/dL (ref 30.0–36.0)
MCV: 84.3 fL (ref 80.0–100.0)
MCV: 90.3 fL (ref 80.0–100.0)
MCV: 88.3 fL (ref 80.0–100.0)
Platelets: 159 10*3/uL (ref 150–400)
Platelets: 62 10*3/uL — ABNORMAL LOW (ref 150–400)
Platelets: 108 10*3/uL — ABNORMAL LOW (ref 150–400)
RBC: 4.58 MIL/uL (ref 4.22–5.81)
RBC: 2.27 MIL/uL — ABNORMAL LOW (ref 4.22–5.81)
RBC: 3.77 MIL/uL — ABNORMAL LOW (ref 4.22–5.81)
RDW: 13.5 % (ref 11.5–15.5)
RDW: 13.5 % (ref 11.5–15.5)
RDW: 13.7 % (ref 11.5–15.5)
WBC: 6.2 10*3/uL (ref 4.0–10.5)
WBC: 3.9 10*3/uL — ABNORMAL LOW (ref 4.0–10.5)
WBC: 9.9 10*3/uL (ref 4.0–10.5)
nRBC: 0 % (ref 0.0–0.2)
nRBC: 0 % (ref 0.0–0.2)
nRBC: 0 % (ref 0.0–0.2)

## 2022-01-08 LAB — PROTIME-INR
INR: 1.1 (ref 0.8–1.2)
INR: 1.4 — ABNORMAL HIGH (ref 0.8–1.2)
Prothrombin Time: 14.5 seconds (ref 11.4–15.2)
Prothrombin Time: 17 seconds — ABNORMAL HIGH (ref 11.4–15.2)

## 2022-01-08 LAB — POCT I-STAT EG7
Acid-Base Excess: 0 mmol/L (ref 0.0–2.0)
Bicarbonate: 25.3 mmol/L (ref 20.0–28.0)
Calcium, Ion: 1.08 mmol/L — ABNORMAL LOW (ref 1.15–1.40)
HCT: 26 % — ABNORMAL LOW (ref 39.0–52.0)
Hemoglobin: 8.8 g/dL — ABNORMAL LOW (ref 13.0–17.0)
O2 Saturation: 83 %
Potassium: 4 mmol/L (ref 3.5–5.1)
Sodium: 139 mmol/L (ref 135–145)
TCO2: 27 mmol/L (ref 22–32)
pCO2, Ven: 43.3 mmHg — ABNORMAL LOW (ref 44–60)
pH, Ven: 7.374 (ref 7.25–7.43)
pO2, Ven: 50 mmHg — ABNORMAL HIGH (ref 32–45)

## 2022-01-08 LAB — BLOOD GAS, ARTERIAL
Acid-Base Excess: 1 mmol/L (ref 0.0–2.0)
Bicarbonate: 26 mmol/L (ref 20.0–28.0)
FIO2: 21 %
O2 Saturation: 95.8 %
Patient temperature: 36.7
pCO2 arterial: 41 mmHg (ref 32–48)
pH, Arterial: 7.4 (ref 7.35–7.45)
pO2, Arterial: 73 mmHg — ABNORMAL LOW (ref 83–108)

## 2022-01-08 LAB — POCT I-STAT, CHEM 8
BUN: 14 mg/dL (ref 8–23)
BUN: 16 mg/dL (ref 8–23)
BUN: 13 mg/dL (ref 8–23)
BUN: 12 mg/dL (ref 8–23)
BUN: 13 mg/dL (ref 8–23)
Calcium, Ion: 1.24 mmol/L (ref 1.15–1.40)
Calcium, Ion: 1.19 mmol/L (ref 1.15–1.40)
Calcium, Ion: 1.07 mmol/L — ABNORMAL LOW (ref 1.15–1.40)
Calcium, Ion: 1.24 mmol/L (ref 1.15–1.40)
Calcium, Ion: 1.27 mmol/L (ref 1.15–1.40)
Chloride: 100 mmol/L (ref 98–111)
Chloride: 101 mmol/L (ref 98–111)
Chloride: 102 mmol/L (ref 98–111)
Chloride: 101 mmol/L (ref 98–111)
Chloride: 103 mmol/L (ref 98–111)
Creatinine, Ser: 1.3 mg/dL — ABNORMAL HIGH (ref 0.61–1.24)
Creatinine, Ser: 1.2 mg/dL (ref 0.61–1.24)
Creatinine, Ser: 1.1 mg/dL (ref 0.61–1.24)
Creatinine, Ser: 1 mg/dL (ref 0.61–1.24)
Creatinine, Ser: 1.1 mg/dL (ref 0.61–1.24)
Glucose, Bld: 168 mg/dL — ABNORMAL HIGH (ref 70–99)
Glucose, Bld: 145 mg/dL — ABNORMAL HIGH (ref 70–99)
Glucose, Bld: 123 mg/dL — ABNORMAL HIGH (ref 70–99)
Glucose, Bld: 126 mg/dL — ABNORMAL HIGH (ref 70–99)
Glucose, Bld: 137 mg/dL — ABNORMAL HIGH (ref 70–99)
HCT: 38 % — ABNORMAL LOW (ref 39.0–52.0)
HCT: 34 % — ABNORMAL LOW (ref 39.0–52.0)
HCT: 25 % — ABNORMAL LOW (ref 39.0–52.0)
HCT: 26 % — ABNORMAL LOW (ref 39.0–52.0)
HCT: 27 % — ABNORMAL LOW (ref 39.0–52.0)
Hemoglobin: 12.9 g/dL — ABNORMAL LOW (ref 13.0–17.0)
Hemoglobin: 11.6 g/dL — ABNORMAL LOW (ref 13.0–17.0)
Hemoglobin: 8.5 g/dL — ABNORMAL LOW (ref 13.0–17.0)
Hemoglobin: 8.8 g/dL — ABNORMAL LOW (ref 13.0–17.0)
Hemoglobin: 9.2 g/dL — ABNORMAL LOW (ref 13.0–17.0)
Potassium: 3.4 mmol/L — ABNORMAL LOW (ref 3.5–5.1)
Potassium: 3.7 mmol/L (ref 3.5–5.1)
Potassium: 4.1 mmol/L (ref 3.5–5.1)
Potassium: 3.9 mmol/L (ref 3.5–5.1)
Potassium: 4.4 mmol/L (ref 3.5–5.1)
Sodium: 139 mmol/L (ref 135–145)
Sodium: 138 mmol/L (ref 135–145)
Sodium: 139 mmol/L (ref 135–145)
Sodium: 139 mmol/L (ref 135–145)
Sodium: 139 mmol/L (ref 135–145)
TCO2: 26 mmol/L (ref 22–32)
TCO2: 27 mmol/L (ref 22–32)
TCO2: 29 mmol/L (ref 22–32)
TCO2: 23 mmol/L (ref 22–32)
TCO2: 28 mmol/L (ref 22–32)

## 2022-01-08 LAB — HEPARIN LEVEL (UNFRACTIONATED): Heparin Unfractionated: 0.19 IU/mL — ABNORMAL LOW (ref 0.30–0.70)

## 2022-01-08 LAB — ECHO INTRAOPERATIVE TEE
Height: 65 in
Weight: 3703.73 oz

## 2022-01-08 LAB — BASIC METABOLIC PANEL
Anion gap: 8 (ref 5–15)
Anion gap: 7 (ref 5–15)
BUN: 16 mg/dL (ref 8–23)
BUN: 13 mg/dL (ref 8–23)
CO2: 26 mmol/L (ref 22–32)
CO2: 22 mmol/L (ref 22–32)
Calcium: 8.6 mg/dL — ABNORMAL LOW (ref 8.9–10.3)
Calcium: 7.6 mg/dL — ABNORMAL LOW (ref 8.9–10.3)
Chloride: 103 mmol/L (ref 98–111)
Chloride: 107 mmol/L (ref 98–111)
Creatinine, Ser: 1.34 mg/dL — ABNORMAL HIGH (ref 0.61–1.24)
Creatinine, Ser: 1.23 mg/dL (ref 0.61–1.24)
GFR, Estimated: 56 mL/min — ABNORMAL LOW (ref >60)
GFR, Estimated: >60 mL/min (ref >60)
Glucose, Bld: 125 mg/dL — ABNORMAL HIGH (ref 70–99)
Glucose, Bld: 192 mg/dL — ABNORMAL HIGH (ref 70–99)
Potassium: 3.7 mmol/L (ref 3.5–5.1)
Potassium: 5.2 mmol/L — ABNORMAL HIGH (ref 3.5–5.1)
Sodium: 137 mmol/L (ref 135–145)
Sodium: 136 mmol/L (ref 135–145)

## 2022-01-08 LAB — HEMOGLOBIN AND HEMATOCRIT, BLOOD
HCT: 27 % — ABNORMAL LOW (ref 39.0–52.0)
Hemoglobin: 9.4 g/dL — ABNORMAL LOW (ref 13.0–17.0)

## 2022-01-08 LAB — APTT
aPTT: 60 seconds — ABNORMAL HIGH (ref 24–36)
aPTT: 32 seconds (ref 24–36)

## 2022-01-08 LAB — GLUCOSE, CAPILLARY
Glucose-Capillary: 148 mg/dL — ABNORMAL HIGH (ref 70–99)
Glucose-Capillary: 138 mg/dL — ABNORMAL HIGH (ref 70–99)

## 2022-01-08 LAB — PREPARE RBC (CROSSMATCH)

## 2022-01-08 LAB — PLATELET COUNT: Platelets: 109 10*3/uL — ABNORMAL LOW (ref 150–400)

## 2022-01-08 LAB — MAGNESIUM: Magnesium: 2.9 mg/dL — ABNORMAL HIGH (ref 1.7–2.4)

## 2022-01-08 SURGERY — CORONARY ARTERY BYPASS GRAFTING (CABG)
Anesthesia: General | Site: Leg Lower | Laterality: Right

## 2022-01-08 MED ORDER — OXYCODONE HCL 5 MG PO TABS
5.0000 mg | ORAL_TABLET | ORAL | Status: DC | PRN
  Administered 2022-01-09: 10 mg via ORAL
  Administered 2022-01-09: 5 mg via ORAL
  Administered 2022-01-09 – 2022-01-15 (×9): 10 mg via ORAL
  Filled 2022-01-08 (×10): qty 2
  Filled 2022-01-08: qty 1

## 2022-01-08 MED ORDER — MAGNESIUM SULFATE 4 GM/100ML IV SOLN
4.0000 g | Freq: Once | INTRAVENOUS | Status: AC
  Administered 2022-01-08: 4 g via INTRAVENOUS
  Filled 2022-01-08: qty 100

## 2022-01-08 MED ORDER — PLASMA-LYTE A IV SOLN
INTRAVENOUS | Status: DC | PRN
  Administered 2022-01-08: 500 mL via INTRAVASCULAR

## 2022-01-08 MED ORDER — CHLORHEXIDINE GLUCONATE CLOTH 2 % EX PADS
6.0000 | MEDICATED_PAD | Freq: Every day | CUTANEOUS | Status: DC
  Administered 2022-01-08 – 2022-01-13 (×5): 6 via TOPICAL

## 2022-01-08 MED ORDER — FENTANYL CITRATE (PF) 250 MCG/5ML IJ SOLN
INTRAMUSCULAR | Status: AC
  Filled 2022-01-08: qty 5

## 2022-01-08 MED ORDER — PROTAMINE SULFATE 10 MG/ML IV SOLN
INTRAVENOUS | Status: DC | PRN
  Administered 2022-01-08: 350 mg via INTRAVENOUS
  Administered 2022-01-08: 20 mg via INTRAVENOUS

## 2022-01-08 MED ORDER — LACTATED RINGERS IV SOLN: INTRAVENOUS | Status: DC | PRN

## 2022-01-08 MED ORDER — CHLORHEXIDINE GLUCONATE 0.12 % MT SOLN
15.0000 mL | OROMUCOSAL | Status: DC
  Filled 2022-01-08: qty 15

## 2022-01-08 MED ORDER — PROPOFOL 10 MG/ML IV BOLUS
INTRAVENOUS | Status: DC | PRN
Start: 2022-01-08 — End: 2022-01-08
  Administered 2022-01-08: 100 mg via INTRAVENOUS

## 2022-01-08 MED ORDER — CHLORHEXIDINE GLUCONATE 0.12% ORAL RINSE (MEDLINE KIT)
15.0000 mL | Freq: Two times a day (BID) | OROMUCOSAL | Status: DC
  Administered 2022-01-08: 15 mL via OROMUCOSAL

## 2022-01-08 MED ORDER — NOREPINEPHRINE 4 MG/250ML-% IV SOLN: 0.0000 ug/min | INTRAVENOUS | Status: DC

## 2022-01-08 MED ORDER — MIDAZOLAM HCL 2 MG/2ML IJ SOLN: 2.0000 mg | INTRAMUSCULAR | Status: DC | PRN

## 2022-01-08 MED ORDER — BISACODYL 10 MG RE SUPP
10.0000 mg | Freq: Every day | RECTAL | Status: DC
  Filled 2022-01-08: qty 1

## 2022-01-08 MED ORDER — MIDAZOLAM HCL (PF) 5 MG/ML IJ SOLN
INTRAMUSCULAR | Status: DC | PRN
  Administered 2022-01-08: 2 mg via INTRAVENOUS
  Administered 2022-01-08: 1 mg via INTRAVENOUS
  Administered 2022-01-08: 2 mg via INTRAVENOUS
  Administered 2022-01-08: 1 mg via INTRAVENOUS

## 2022-01-08 MED ORDER — DEXMEDETOMIDINE HCL IN NACL 400 MCG/100ML IV SOLN
INTRAVENOUS | Status: AC
  Filled 2022-01-08: qty 100

## 2022-01-08 MED ORDER — LACTATED RINGERS IV SOLN: INTRAVENOUS | Status: DC

## 2022-01-08 MED ORDER — METOPROLOL TARTRATE 12.5 MG HALF TABLET: 12.5000 mg | ORAL_TABLET | Freq: Two times a day (BID) | ORAL | Status: DC

## 2022-01-08 MED ORDER — ROCURONIUM BROMIDE 10 MG/ML (PF) SYRINGE
PREFILLED_SYRINGE | INTRAVENOUS | Status: DC | PRN
  Administered 2022-01-08: 50 mg via INTRAVENOUS
  Administered 2022-01-08: 30 mg via INTRAVENOUS
  Administered 2022-01-08 (×2): 50 mg via INTRAVENOUS
  Administered 2022-01-08: 100 mg via INTRAVENOUS

## 2022-01-08 MED ORDER — PHENYLEPHRINE 40 MCG/ML (10ML) SYRINGE FOR IV PUSH (FOR BLOOD PRESSURE SUPPORT)
PREFILLED_SYRINGE | INTRAVENOUS | Status: DC | PRN
  Administered 2022-01-08 (×2): 80 ug via INTRAVENOUS

## 2022-01-08 MED ORDER — ACETAMINOPHEN 650 MG RE SUPP
650.0000 mg | Freq: Once | RECTAL | Status: AC
  Administered 2022-01-08: 650 mg via RECTAL

## 2022-01-08 MED ORDER — MIDAZOLAM HCL (PF) 10 MG/2ML IJ SOLN
INTRAMUSCULAR | Status: AC
  Filled 2022-01-08: qty 2

## 2022-01-08 MED ORDER — ACETAMINOPHEN 160 MG/5ML PO SOLN
1000.0000 mg | Freq: Four times a day (QID) | ORAL | Status: AC
  Administered 2022-01-08 – 2022-01-09 (×2): 1000 mg
  Filled 2022-01-08 (×2): qty 40.6

## 2022-01-08 MED ORDER — VANCOMYCIN HCL IN DEXTROSE 1-5 GM/200ML-% IV SOLN
1000.0000 mg | Freq: Once | INTRAVENOUS | Status: AC
  Administered 2022-01-08: 1000 mg via INTRAVENOUS
  Filled 2022-01-08: qty 200

## 2022-01-08 MED ORDER — TRAMADOL HCL 50 MG PO TABS
50.0000 mg | ORAL_TABLET | ORAL | Status: DC | PRN
  Administered 2022-01-10 – 2022-01-11 (×2): 100 mg via ORAL
  Filled 2022-01-08 (×2): qty 2

## 2022-01-08 MED ORDER — SODIUM CHLORIDE 0.9% FLUSH
3.0000 mL | Freq: Two times a day (BID) | INTRAVENOUS | Status: DC
  Administered 2022-01-09 – 2022-01-11 (×4): 3 mL via INTRAVENOUS

## 2022-01-08 MED ORDER — NITROGLYCERIN IN D5W 200-5 MCG/ML-% IV SOLN
0.0000 ug/min | INTRAVENOUS | Status: DC
  Administered 2022-01-08: 5 ug/min via INTRAVENOUS

## 2022-01-08 MED ORDER — INSULIN REGULAR(HUMAN) IN NACL 100-0.9 UT/100ML-% IV SOLN
INTRAVENOUS | Status: DC
  Administered 2022-01-08: 2 [IU]/h via INTRAVENOUS

## 2022-01-08 MED ORDER — NICARDIPINE HCL IN NACL 20-0.86 MG/200ML-% IV SOLN: 0.0000 mg/h | INTRAVENOUS | Status: DC

## 2022-01-08 MED ORDER — ONDANSETRON HCL 4 MG/2ML IJ SOLN
4.0000 mg | Freq: Four times a day (QID) | INTRAMUSCULAR | Status: DC | PRN
  Administered 2022-01-09 – 2022-01-15 (×4): 4 mg via INTRAVENOUS
  Filled 2022-01-08 (×4): qty 2

## 2022-01-08 MED ORDER — FAMOTIDINE IN NACL 20-0.9 MG/50ML-% IV SOLN
20.0000 mg | Freq: Two times a day (BID) | INTRAVENOUS | Status: DC
  Administered 2022-01-08: 20 mg via INTRAVENOUS

## 2022-01-08 MED ORDER — ASPIRIN EC 325 MG PO TBEC
325.0000 mg | DELAYED_RELEASE_TABLET | Freq: Every day | ORAL | Status: DC
  Administered 2022-01-09 – 2022-01-12 (×4): 325 mg via ORAL
  Filled 2022-01-08 (×4): qty 1

## 2022-01-08 MED ORDER — CEFAZOLIN SODIUM-DEXTROSE 2-4 GM/100ML-% IV SOLN
2.0000 g | Freq: Three times a day (TID) | INTRAVENOUS | Status: AC
  Administered 2022-01-08 – 2022-01-10 (×6): 2 g via INTRAVENOUS
  Filled 2022-01-08 (×6): qty 100

## 2022-01-08 MED ORDER — DOBUTAMINE IN D5W 4-5 MG/ML-% IV SOLN
2.5000 ug/kg/min | INTRAVENOUS | Status: DC
  Administered 2022-01-08 (×2): 2.5 ug/kg/min via INTRAVENOUS

## 2022-01-08 MED ORDER — DOCUSATE SODIUM 100 MG PO CAPS
200.0000 mg | ORAL_CAPSULE | Freq: Every day | ORAL | Status: DC
  Administered 2022-01-10 – 2022-01-15 (×6): 200 mg via ORAL
  Filled 2022-01-08 (×7): qty 2

## 2022-01-08 MED ORDER — ASPIRIN 81 MG PO CHEW: 324.0000 mg | CHEWABLE_TABLET | Freq: Every day | ORAL | Status: DC

## 2022-01-08 MED ORDER — DEXMEDETOMIDINE HCL IN NACL 400 MCG/100ML IV SOLN
0.0000 ug/kg/h | INTRAVENOUS | Status: DC
  Administered 2022-01-08: 0.7 ug/kg/h via INTRAVENOUS

## 2022-01-08 MED ORDER — HEPARIN SODIUM (PORCINE) 1000 UNIT/ML IJ SOLN
INTRAMUSCULAR | Status: DC | PRN
  Administered 2022-01-08: 37000 [IU] via INTRAVENOUS

## 2022-01-08 MED ORDER — DEXTROSE 50 % IV SOLN: 0.0000 mL | INTRAVENOUS | Status: DC | PRN

## 2022-01-08 MED ORDER — METOPROLOL TARTRATE 25 MG/10 ML ORAL SUSPENSION: 12.5000 mg | Freq: Two times a day (BID) | ORAL | Status: DC

## 2022-01-08 MED ORDER — ACETAMINOPHEN 160 MG/5ML PO SOLN: 650.0000 mg | Freq: Once | ORAL | Status: AC

## 2022-01-08 MED ORDER — SODIUM CHLORIDE 0.9 % IV SOLN: INTRAVENOUS | Status: DC | PRN

## 2022-01-08 MED ORDER — ALBUMIN HUMAN 5 % IV SOLN
250.0000 mL | INTRAVENOUS | Status: AC | PRN
  Administered 2022-01-08 (×3): 12.5 g via INTRAVENOUS
  Filled 2022-01-08 (×2): qty 250

## 2022-01-08 MED ORDER — SODIUM CHLORIDE 0.9 % IV SOLN: 250.0000 mL | INTRAVENOUS | Status: DC

## 2022-01-08 MED ORDER — SODIUM CHLORIDE 0.9% FLUSH: 3.0000 mL | INTRAVENOUS | Status: DC | PRN

## 2022-01-08 MED ORDER — BISACODYL 5 MG PO TBEC
10.0000 mg | DELAYED_RELEASE_TABLET | Freq: Every day | ORAL | Status: DC
  Administered 2022-01-10 – 2022-01-15 (×6): 10 mg via ORAL
  Filled 2022-01-08 (×7): qty 2

## 2022-01-08 MED ORDER — PANTOPRAZOLE SODIUM 40 MG PO TBEC
40.0000 mg | DELAYED_RELEASE_TABLET | Freq: Every day | ORAL | Status: DC
  Administered 2022-01-10 – 2022-01-15 (×6): 40 mg via ORAL
  Filled 2022-01-08 (×6): qty 1

## 2022-01-08 MED ORDER — ATORVASTATIN CALCIUM 40 MG PO TABS
40.0000 mg | ORAL_TABLET | Freq: Every day | ORAL | Status: DC
  Administered 2022-01-09 – 2022-01-14 (×5): 40 mg via ORAL
  Filled 2022-01-08 (×6): qty 1

## 2022-01-08 MED ORDER — ALBUMIN HUMAN 5 % IV SOLN: INTRAVENOUS | Status: DC | PRN

## 2022-01-08 MED ORDER — POTASSIUM CHLORIDE 10 MEQ/50ML IV SOLN: 10.0000 meq | INTRAVENOUS | Status: AC

## 2022-01-08 MED ORDER — SODIUM CHLORIDE 0.9 % IV SOLN: INTRAVENOUS | Status: DC

## 2022-01-08 MED ORDER — EPHEDRINE SULFATE-NACL 50-0.9 MG/10ML-% IV SOSY
PREFILLED_SYRINGE | INTRAVENOUS | Status: DC | PRN
  Administered 2022-01-08 (×2): 5 mg via INTRAVENOUS
  Administered 2022-01-08: 10 mg via INTRAVENOUS
  Administered 2022-01-08: 5 mg via INTRAVENOUS

## 2022-01-08 MED ORDER — 0.9 % SODIUM CHLORIDE (POUR BTL) OPTIME
TOPICAL | Status: DC | PRN
  Administered 2022-01-08: 5000 mL

## 2022-01-08 MED ORDER — METOPROLOL TARTRATE 5 MG/5ML IV SOLN
2.5000 mg | INTRAVENOUS | Status: DC | PRN
  Administered 2022-01-14 (×4): 5 mg via INTRAVENOUS
  Filled 2022-01-08 (×5): qty 5

## 2022-01-08 MED ORDER — LACTATED RINGERS IV SOLN: 500.0000 mL | Freq: Once | INTRAVENOUS | Status: DC | PRN

## 2022-01-08 MED ORDER — FAMOTIDINE IN NACL 20-0.9 MG/50ML-% IV SOLN
20.0000 mg | Freq: Two times a day (BID) | INTRAVENOUS | Status: DC
  Filled 2022-01-08: qty 50

## 2022-01-08 MED ORDER — ORAL CARE MOUTH RINSE
15.0000 mL | OROMUCOSAL | Status: DC
  Administered 2022-01-08 – 2022-01-09 (×5): 15 mL via OROMUCOSAL

## 2022-01-08 MED ORDER — PROPOFOL 10 MG/ML IV BOLUS
INTRAVENOUS | Status: AC
  Filled 2022-01-08: qty 20

## 2022-01-08 MED ORDER — CALCIUM CHLORIDE 10 % IV SOLN
INTRAVENOUS | Status: DC | PRN
  Administered 2022-01-08: 100 mg via INTRAVENOUS

## 2022-01-08 MED ORDER — FENTANYL CITRATE (PF) 250 MCG/5ML IJ SOLN
INTRAMUSCULAR | Status: DC | PRN
  Administered 2022-01-08 (×4): 50 ug via INTRAVENOUS
  Administered 2022-01-08: 200 ug via INTRAVENOUS
  Administered 2022-01-08: 50 ug via INTRAVENOUS
  Administered 2022-01-08: 100 ug via INTRAVENOUS

## 2022-01-08 MED ORDER — THROMBIN 5000 UNITS EX SOLR
OROMUCOSAL | Status: DC | PRN
  Administered 2022-01-08 (×2): 4 mL via TOPICAL

## 2022-01-08 MED ORDER — SODIUM CHLORIDE 0.45 % IV SOLN: INTRAVENOUS | Status: DC | PRN

## 2022-01-08 MED ORDER — PHENYLEPHRINE 40 MCG/ML (10ML) SYRINGE FOR IV PUSH (FOR BLOOD PRESSURE SUPPORT): PREFILLED_SYRINGE | INTRAVENOUS | Status: DC | PRN

## 2022-01-08 MED ORDER — LACTATED RINGERS IV SOLN
INTRAVENOUS | Status: DC
Start: 2022-01-08 — End: 2022-01-08

## 2022-01-08 MED ORDER — SODIUM CHLORIDE 0.9% IV SOLUTION: Freq: Once | INTRAVENOUS | Status: DC

## 2022-01-08 MED ORDER — ACETAMINOPHEN 500 MG PO TABS
1000.0000 mg | ORAL_TABLET | Freq: Four times a day (QID) | ORAL | Status: AC
  Administered 2022-01-09 – 2022-01-13 (×14): 1000 mg via ORAL
  Filled 2022-01-08 (×15): qty 2

## 2022-01-08 MED ORDER — CHLORHEXIDINE GLUCONATE 0.12 % MT SOLN
15.0000 mL | OROMUCOSAL | Status: AC
  Administered 2022-01-08: 15 mL via OROMUCOSAL

## 2022-01-08 MED ORDER — MORPHINE SULFATE (PF) 2 MG/ML IV SOLN
1.0000 mg | INTRAVENOUS | Status: DC | PRN
  Administered 2022-01-09 (×6): 2 mg via INTRAVENOUS
  Administered 2022-01-10: 16:00:00 4 mg via INTRAVENOUS
  Administered 2022-01-10: 18:00:00 2 mg via INTRAVENOUS
  Filled 2022-01-08: qty 1
  Filled 2022-01-08: qty 2
  Filled 2022-01-08 (×6): qty 1

## 2022-01-08 SURGICAL SUPPLY — 74 items
BAG DECANTER FOR FLEXI CONT (MISCELLANEOUS) ×5 IMPLANT
BLADE CLIPPER SURG (BLADE) ×5 IMPLANT
BLADE STERNUM SYSTEM 6 (BLADE) ×5 IMPLANT
BLADE SURG 11 STRL SS (BLADE) ×2 IMPLANT
BNDG ELASTIC 4X5.8 VLCR STR LF (GAUZE/BANDAGES/DRESSINGS) ×7 IMPLANT
BNDG ELASTIC 6X5.8 VLCR STR LF (GAUZE/BANDAGES/DRESSINGS) ×7 IMPLANT
BNDG GAUZE ELAST 4 BULKY (GAUZE/BANDAGES/DRESSINGS) ×7 IMPLANT
CABLE SURGICAL S-101-97-12 (CABLE) ×5 IMPLANT
CANISTER SUCT 3000ML PPV (MISCELLANEOUS) ×5 IMPLANT
CANNULA MC2 2 STG 29/37 NON-V (CANNULA) ×3 IMPLANT
CANNULA MC2 TWO STAGE (CANNULA) ×5
CANNULA NON VENT 22FR 12 (CANNULA) ×2 IMPLANT
CATH ROBINSON RED A/P 18FR (CATHETERS) ×10 IMPLANT
CONN ST 1/2X1/2  BEN (MISCELLANEOUS) ×5
CONN ST 1/2X1/2 BEN (MISCELLANEOUS) ×3 IMPLANT
CONNECTOR BLAKE 2:1 CARIO BLK (MISCELLANEOUS) ×5 IMPLANT
CONTAINER PROTECT SURGISLUSH (MISCELLANEOUS) ×10 IMPLANT
DRAIN CHANNEL 19F RND (DRAIN) ×15 IMPLANT
DRAPE CARDIOVASCULAR INCISE (DRAPES) ×5
DRAPE SRG 135X102X78XABS (DRAPES) ×3 IMPLANT
DRAPE WARM FLUID 44X44 (DRAPES) ×5 IMPLANT
DRSG AQUACEL AG ADV 3.5X10 (GAUZE/BANDAGES/DRESSINGS) ×5 IMPLANT
ELECT BLADE 4.0 EZ CLEAN MEGAD (MISCELLANEOUS) ×5
ELECT REM PT RETURN 9FT ADLT (ELECTROSURGICAL) ×10
ELECTRODE BLDE 4.0 EZ CLN MEGD (MISCELLANEOUS) ×3 IMPLANT
ELECTRODE REM PT RTRN 9FT ADLT (ELECTROSURGICAL) ×6 IMPLANT
FELT TEFLON 1X6 (MISCELLANEOUS) ×8 IMPLANT
GAUZE 4X4 16PLY ~~LOC~~+RFID DBL (SPONGE) ×5 IMPLANT
GAUZE SPONGE 4X4 12PLY STRL (GAUZE/BANDAGES/DRESSINGS) ×12 IMPLANT
GLOVE SURG ENC MOIS LTX SZ7 (GLOVE) ×10 IMPLANT
GLOVE SURG ENC TEXT LTX SZ7.5 (GLOVE) ×10 IMPLANT
GLOVE SURG UNDER POLY LF SZ6 (GLOVE) ×2 IMPLANT
GOWN STRL REUS W/ TWL LRG LVL3 (GOWN DISPOSABLE) ×12 IMPLANT
GOWN STRL REUS W/ TWL XL LVL3 (GOWN DISPOSABLE) ×6 IMPLANT
GOWN STRL REUS W/TWL LRG LVL3 (GOWN DISPOSABLE) ×25
GOWN STRL REUS W/TWL XL LVL3 (GOWN DISPOSABLE) ×10
HEMOSTAT POWDER SURGIFOAM 1G (HEMOSTASIS) ×13 IMPLANT
INSERT SUTURE HOLDER (MISCELLANEOUS) ×5 IMPLANT
KIT BASIN OR (CUSTOM PROCEDURE TRAY) ×5 IMPLANT
KIT SUCTION CATH 14FR (SUCTIONS) ×5 IMPLANT
KIT TURNOVER KIT B (KITS) ×5 IMPLANT
KIT VASOVIEW HEMOPRO 2 VH 4000 (KITS) ×5 IMPLANT
LEAD PACING MYOCARDI (MISCELLANEOUS) ×2 IMPLANT
MARKER GRAFT CORONARY BYPASS (MISCELLANEOUS) ×15 IMPLANT
NS IRRIG 1000ML POUR BTL (IV SOLUTION) ×25 IMPLANT
PACK ACCESSORY CANNULA KIT (KITS) ×5 IMPLANT
PACK E OPEN HEART (SUTURE) ×5 IMPLANT
PACK OPEN HEART (CUSTOM PROCEDURE TRAY) ×5 IMPLANT
PAD ELECT DEFIB RADIOL ZOLL (MISCELLANEOUS) ×5 IMPLANT
POSITIONER HEAD DONUT 9IN (MISCELLANEOUS) ×5 IMPLANT
PUNCH AORTIC ROTATE 4.0MM (MISCELLANEOUS) ×5 IMPLANT
SET MPS 3-ND DEL (MISCELLANEOUS) ×2 IMPLANT
SPONGE T-LAP 18X18 ~~LOC~~+RFID (SPONGE) ×20 IMPLANT
SUPPORT HEART JANKE-BARRON (MISCELLANEOUS) ×5 IMPLANT
SUT BONE WAX W31G (SUTURE) ×5 IMPLANT
SUT ETHIBOND X763 2 0 SH 1 (SUTURE) ×10 IMPLANT
SUT MNCRL AB 3-0 PS2 18 (SUTURE) ×10 IMPLANT
SUT MNCRL AB 4-0 PS2 18 (SUTURE) ×2 IMPLANT
SUT PDS AB 1 CTX 36 (SUTURE) ×10 IMPLANT
SUT PROLENE 4 0 SH DA (SUTURE) ×5 IMPLANT
SUT PROLENE 5 0 C 1 36 (SUTURE) ×15 IMPLANT
SUT PROLENE 7 0 BV1 MDA (SUTURE) ×7 IMPLANT
SUT STEEL 6MS V (SUTURE) ×10 IMPLANT
SUT VIC AB 2-0 CT1 27 (SUTURE) ×5
SUT VIC AB 2-0 CT1 TAPERPNT 27 (SUTURE) IMPLANT
SYSTEM SAHARA CHEST DRAIN ATS (WOUND CARE) ×5 IMPLANT
TAPE CLOTH SURG 4X10 WHT LF (GAUZE/BANDAGES/DRESSINGS) ×4 IMPLANT
TAPE PAPER 2X10 WHT MICROPORE (GAUZE/BANDAGES/DRESSINGS) ×2 IMPLANT
TOWEL GREEN STERILE (TOWEL DISPOSABLE) ×5 IMPLANT
TOWEL GREEN STERILE FF (TOWEL DISPOSABLE) ×5 IMPLANT
TRAY FOLEY SLVR 16FR TEMP STAT (SET/KITS/TRAYS/PACK) ×5 IMPLANT
TUBING LAP HI FLOW INSUFFLATIO (TUBING) ×5 IMPLANT
UNDERPAD 30X36 HEAVY ABSORB (UNDERPADS AND DIAPERS) ×5 IMPLANT
WATER STERILE IRR 1000ML POUR (IV SOLUTION) ×10 IMPLANT

## 2022-01-08 NOTE — Discharge Summary (Incomplete)
Physician Discharge Summary       [address]       [place] [address]             [phone]    Patient ID: Raymond [name] MRN: [id number] DOB/AGE: 02-02-1951 71 y.o.  Admit date: 01/05/2022 Discharge date: 01/14/2022  Admission Diagnoses:   Non-ST elevation (NSTEMI) myocardial infarction ([place]) 2.  Coronary artery disease involving native coronary artery of native heart 3. Acute combined systolic and diastolic heart failure ([place]) Discharge Diagnoses:  S/p CABG x 3 2. Post op ileus 3. Expected post op blood loss anemia 4. History of the following: Acute bronchitis with COPD ([place]) DM type 2, controlled, with complication ([place]) Late effects of CVA (cerebrovascular accident) Pure hypercholesterolemia Obstructive sleep apnea Depression Hypertension Migraines   Consults: pulmonary/intensive care  Procedure (s):  CABG X 3.  LIMA to LAD, reverse saphenous vein graft to OM1, and first diagonal. Endoscopic greater saphenous vein harvest on the right and left by Raymond [name] on 01/08/2022.  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  He presented to the emergency room this past 2 days ago with a primary complaint of shortness of breath that had been ongoing and intensifying over the previous week.  Been seen earlier by his primary care physician and started on an antibiotic orally for suspected pneumonia.  In the emergency room, high-sensitivity troponin was initially measured at 34 and steadily rose to 97 over the next several hours.  CBC and CMP were unrevealing.  The BNP was elevated at 1070.  Chest x-ray showed trace bilateral pleural effusions but was otherwise unremarkable.  EKG showed normal sinus rhythm with no acute changes.  He had a CTA of the chest that showed no evidence of pulmonary embolus but  also confirmed the presence of small bilateral pleural effusions.  There was no evidence of pneumonia on CT scan.  He was admitted to the hospital with suspicion of congestive heart failure.  He was diuresed with Lasix and also was started on spironolactone and losartan.  After admission, he had an echocardiogram that showed left ventricular function was decreased to 35 to 40% compared to previous study a few years earlier where his ejection fraction was 45 to 50%.  Study also revealed grade 2 diastolic dysfunction, severe anteroapical and anteroseptal hypokinesis.  There was mild mitral insufficiency but no aortic insufficiency or aortic stenosis.  There was a 14 mm x 5 mm LV thrombus that appeared to be broad-based and fixed.  Given these findings, the patient was started on IV heparin.  Cardiology was consulted.  The patient was seen by Raymond [name].  Raymond [name] symptoms improved with the medical management.   Further work-up with left heart catheterization was recommended and was accomplished earlier today.  This study demonstrates a 95% proximal left main stenosis.  The LAD is occluded at the middle segment just after the second diagonal.  There are right to left collaterals.  The second diagonal coronary artery had a 95% stenosis.  The right coronary artery was large and dominant and free of any obstructive disease. CT surgery has been asked to evaluate Raymond [name] for consideration of coronary bypass grafting.   Raymond [name] is currently resting quietly in his bed on 3 E.  His wife and daughter are at the bedside.  He denies having any  chest pain or shortness of breath presently.  He denies having any dental issues.  Last dental visit was a few years ago.  He is right-hand dominant. 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   In regards to the stroke, Raymond [name] reports he had right-sided hemiparesis and loss of  hearing of sudden onset in 2018.  Work-up was essentially unremarkable and included an event recorder which he still has inserted in his left anterior chest wall.  After a course of physical therapy, the only residual neurologic deficits were the continued hearing loss and  a tremor in his right hand.  Raymond [name] discussed the need for coronary artery bypass grafting surgery. Potential risks, benefits, and complications of the surgery were discussed with the patient and he agreed to proceed with surgery. Pre operative carotid duplex US showed no significant internal carotid artery stenosis bilaterally.    Hospital Course: Patient underwent a CABG x 3. He was transferred from the OR to [facility] ICU in stable condition on 03/07. He was extubated the morning of post op day one. He remained afebrile and hemodynamically stable. Raymond [name], a line were removed on post op day one. Chest tubes and foley were removed the following day. He was transitioned off the Insulin drip. His pre op HGA1C is 5.2. Patient had symptomatic hypotension 03/09. As discussed with Raymond [name], he was given Albumin with improvement. He had some confusion also. He had no focal deficits on exam (he has a history of stroke). Pulmonary/CCM evaluated. Patient likely has encephalopathy/? ICU delirium. He developed abdominal distention. KUB showed probable ileus. He was passing flatus. His diet was slowly advanced as tolerated. He was started on Apixaban (he had previous LV thrombus). He had AKI post op. His creatinine went up to 2.06 but gradually decreased. His last creatinine was down to 1.3. He was felt surgically stable for transfer from the ICU to 4 E on 03/10.  The patient was hypertensive and he was restarted on his Cozaar, which was titrated as needed.  He had issues with constipation and was placed on a liquid diet.  He was treated with laxatives with relief of symptoms.  He was volume overloaded and started on Lasix and potassium  supplements.  He is ambulating without difficulty.  He was hypertensive so Losartan was increased to 100 mg daily (as taken prior to surgery). His surgical incisions are healing without evidence of infection.  He is medically stable for discharge home today.  Latest Vital Signs: Blood pressure (!) 183/82, pulse (!) 59, temperature 98.3 F (36.8 C), temperature source Oral, resp. rate 19, height 5\' 5"  (1.651 m), weight 110.6 kg, SpO2 97 %.  Physical Exam: Cardiovascular: Slightly bradycardic Pulmonary: Clear to auscultation bilaterally Abdomen: Soft, non tender, bowel sounds present. Extremities: Mild bilateral lower extremity edema. Wounds: Clean and dry.  No erythema or signs of infection  Discharge Condition:Stable and discharged to home.  Recent laboratory studies:  Lab Results  Component Value Date   WBC 6.1 01/12/2022   HGB 10.0 (L) 01/12/2022   HCT 30.5 (L) 01/12/2022   MCV 90.8 01/12/2022   PLT 128 (L) 01/12/2022   Lab Results  Component Value Date   NA 138 01/13/2022   K 3.9 01/13/2022   CL 103 01/13/2022   CO2 26 01/13/2022   CREATININE 1.35 (H) 01/13/2022   GLUCOSE 127 (H) 01/13/2022  Diagnostic Studies: DG Chest 2 View  Result Date: 01/05/2022 CLINICAL DATA:  chest pain EXAM: CHEST - 2 VIEW COMPARISON:  October 18, 2020 FINDINGS: The cardiomediastinal silhouette is unchanged and enlarged in contour.Cardiac loop recorder. Trace bilateral pleural effusions. No pneumothorax. Perihilar vascular congestion with diffuse interstitial markings. Visualized abdomen is unremarkable. Multilevel degenerative changes of the thoracic spine. IMPRESSION: Constellation of findings are favored to reflect pulmonary edema with trace bilateral pleural effusions. Differential considerations include atypical infection. Electronically Signed   By: Valentino Saxon M.D.   On: 01/05/2022 16:56   CT Angio Chest PE W and/or Wo Contrast  Result Date: 01/05/2022 CLINICAL DATA:   Nonproductive cough for 2 days. Cough worse today with shortness of breath. EXAM: CT ANGIOGRAPHY CHEST WITH CONTRAST TECHNIQUE: Multidetector CT imaging of the chest was performed using the standard protocol during bolus administration of intravenous contrast. Multiplanar CT image reconstructions and MIPs were obtained to evaluate the vascular anatomy. RADIATION DOSE REDUCTION: This exam was performed according to the departmental dose-optimization program which includes automated exposure control, adjustment of the mA and/or kV according to patient size and/or use of iterative reconstruction technique. CONTRAST:  127mL OMNIPAQUE IOHEXOL 350 MG/ML SOLN COMPARISON:  Current and prior chest radiographs. FINDINGS: Cardiovascular: Pulmonary arteries are well opacified. There is no evidence of a pulmonary embolism. Heart is mildly enlarged. Trace pericardial effusion. Mild left and circumflex coronary artery calcifications. Aorta is not opacified. Is normal in caliber. No atherosclerotic calcifications. Mediastinum/Nodes: No neck base, mediastinal or hilar masses or enlarged lymph nodes. Trachea and esophagus are unremarkable. Lungs/Pleura: Small pleural effusions. Mild interstitial thickening. Mild dependent atelectasis in the lower lobes at the posterior lung bases. Small calcified granuloma in the left upper lobe near the apex. No convincing pneumonia. No pneumothorax. Upper Abdomen: No acute abnormality. Musculoskeletal: No fracture or bone lesion.  No chest wall masses. Review of the MIP images confirms the above findings. IMPRESSION: 1. No evidence of a pulmonary embolism. 2. No convincing pneumonia. 3. Cardiomegaly, small effusions and mild interstitial thickening supports the chest radiographic impression of mild congestive heart failure. Electronically Signed   By: Lajean Manes M.D.   On: 01/05/2022 18:30   CARDIAC CATHETERIZATION  Result Date: 01/07/2022 Images from the original result were not included.    Ost LM to Mid LM lesion is 95% stenosed.   Mid LAD lesion is 100% stenosed.   2nd Diag lesion is 95% stenosed. DEVAUN LOUDY is a 71 y.o. male  [id number] LOCATION:  FACILITY: [facility] PHYSICIAN: Quay Burow, M.D. 04/07/1951 DATE OF PROCEDURE:  01/07/2022 DATE OF DISCHARGE: CARDIAC CATHETERIZATION History obtained from chart review.71 y.o. male with diabetes, obesity, hypertension, stroke, PAF, hyperlipidemia, and OSA on CPAP admitted with acute systolic and diastolic heart failure.  By history, it appears he had an out of hospital anterior MI proxy a week ago.  His EF is in the 25 to 30% range.  He was diuresed and medically optimized.  He is now able to lie flat.  He said no further chest pain.  He does have segmental wall motion abnormalities consistent with myocardial infarction in the LAD territory.  He presents now for diagnostic coronary angiography.  Of note, he did have an apical mural thrombus by [facility] echocardiography.   Mr. Mccullar has high-grade distal left main disease with occluded mid LAD, grade 2-3 right to left collaterals and moderately severe LV dysfunction for apical mural thrombus.  It is possible that he is still has viability in the LAD  territory given his significant collaterals.  He will need coronary artery bypass grafting.  I will restart heparin 4 hours after sheath removal.  The radial sheath was removed and a TR band was placed on the right wrist to achieve patent hemostasis.  The patient left lab in stable condition.  T CTS has been notified. Nanetta Batty. MD, [facility] 01/07/2022 2:03 PM    DG Chest Port 1 View  Result Date: 01/10/2022 CLINICAL DATA:  Shortness of breath EXAM: PORTABLE CHEST 1 VIEW COMPARISON:  Two days ago FINDINGS: Right IJ line with tip at the upper SVC. Tracheal and esophageal extubation. Low volume chest with hazy density at the bases, greater on the left, likely atelectasis with possible pleural fluid. Postoperative heart with cardiopericardial enlargement that is  similar to prior. IMPRESSION: Extubation with similar degree of low lung volumes and atelectasis. Electronically Signed   By: Tiburcio Pea M.D.   On: 01/10/2022 07:01   DG Chest Port 1 View  Result Date: 01/09/2022 CLINICAL DATA:  Recent CABG. EXAM: PORTABLE CHEST 1 VIEW COMPARISON:  PA Lat 01/05/2022. FINDINGS: Interval CABG with intact median sternotomy sutures. Stable cardiomegaly and mild central vascular prominence. There is no overt evidence of pulmonary edema. There are bilateral perihilar atelectatic bands, low lung volumes and increased opacity in the base of the lungs which could be atelectasis or consolidation. The mid and upper lungs are clear except for the parahilar linear atelectasis. A left chest loop recorder device is again noted. ETT tip is 4.5 cm from the carina, NGT enters the stomach with the intragastric course not fully included. There is a least 1 mediastinal surgical drain. There is a right IJ central line through an introducer sheath with tip at the brachiocephalic/SVC junction. No pneumothorax is seen. There are small pleural effusions. IMPRESSION: 1. CABG changes with mild central vascular distention without edema. 2. Small pleural effusions with opacity in the lung bases consistent with atelectasis or consolidation. 3. Support devices as above, with at least 1 mediastinal surgical drain in place. Electronically Signed   By: Almira Bar M.D.   On: 01/09/2022 07:01   DG Chest Port 1 View  Result Date: 01/08/2022 CLINICAL DATA:  Follow-up CABG EXAM: PORTABLE CHEST 1 VIEW COMPARISON:  01/05/2022 FINDINGS: Interval median sternotomy and CABG. Endotracheal tube tip 4 cm above the carina. Orogastric or nasogastric tube has its tip in the body of the stomach. Lead artifacts overlie the chest. Right internal jugular venous access sheath with central line tip in the SVC at the as ago is level. Question Swan-Ganz catheter introduced from a femoral approach, looping in the right atrium  with its tip possibly in the right ventricle. This could possibly be some other sort of catheter or wire. There is mild pulmonary edema and atelectasis. No pneumothorax. IMPRESSION: Status post CABG. No visible pneumothorax. See above for details of line and tube positions. Mild edema and atelectasis. Electronically Signed   By: Paulina Fusi M.D.   On: 01/08/2022 16:45   DG Abd Portable 1V  Result Date: 01/10/2022 CLINICAL DATA:  Abdominal distension EXAM: PORTABLE ABDOMEN - 1 VIEW COMPARISON:  06/09/2017 FINDINGS: Gaseous distension of large bowel throughout the abdomen. No pathologically dilated loops of small bowel are seen. No gross free intraperitoneal air on AP portable supine view that does not include the entirety of the abdomen. No radio-opaque calculi or other significant radiographic abnormality are seen. IMPRESSION: Gaseous distension of large bowel throughout the abdomen suggesting ileus. Electronically Signed  By: Davina Poke D.O.   On: 01/10/2022 14:40   ECHOCARDIOGRAM COMPLETE  Result Date: 01/06/2022    ECHOCARDIOGRAM REPORT   Patient Name:   LOU SANDBORN Date of Exam: 01/06/2022 Medical Rec #:  [id number]     Height:       65.0 in Accession #:    [id number]    Weight:       232.6 lb Date of Birth:  30-Jun-1951    BSA:          2.109 m Patient Age:    63 years      BP:           154/81 mmHg Patient Gender: M             HR:           61 bpm. Exam Location:  Inpatient Procedure: 2D Echo Indications:    CHF  History:        Patient has prior history of Echocardiogram examinations, most                 recent 06/24/2017. CHF; Risk Factors:Hypertension and Diabetes.  Sonographer:    Jefferey Pica Referring Phys: [id number] Noel  1. Suspicious for 14 x 5 mm thrombus (broad-based and fixed) at the level of the inferior aspect of the apical left ventricular dyskinesia. Left ventricular ejection fraction, by estimation, is 35 to 40%. The left ventricle has mild to moderately  decreased function. The left ventricle demonstrates regional wall motion abnormalities (see scoring diagram/findings for description). The left ventricular internal cavity size was mildly dilated. There is moderate concentric left ventricular hypertrophy. Left ventricular diastolic parameters are consistent with Grade II diastolic dysfunction (pseudonormalization). Elevated left atrial pressure. There is moderate dyskinesis of the left ventricular, apical segment. There is severe hypokinesis of the left ventricular, mid-apical anterior wall and anteroseptal wall.  2. Right ventricular systolic function is normal. The right ventricular size is normal.  3. Left atrial size was mild to moderately dilated.  4. The mitral valve is normal in structure. Mild mitral valve regurgitation. No evidence of mitral stenosis.  5. The aortic valve is tricuspid. Aortic valve regurgitation is not visualized. No aortic stenosis is present.  6. The inferior vena cava is normal in size with greater than 50% respiratory variability, suggesting right atrial pressure of 3 mmHg. Comparison(s): Prior images unable to be directly viewed, comparison made by report only. FINDINGS  Left Ventricle: Suspicious for 14 x 5 mm thrombus (broad-based and fixed) at the level of the inferior aspect of the apical left ventricular dyskinesia. Left ventricular ejection fraction, by estimation, is 35 to 40%. The left ventricle has mild to moderately decreased function. The left ventricle demonstrates regional wall motion abnormalities. Severe hypokinesis of the left ventricular, mid-apical anterior wall and anteroseptal wall. The left ventricular internal cavity size was mildly dilated. There is moderate concentric left ventricular hypertrophy. Left ventricular diastolic parameters are consistent with Grade II diastolic dysfunction (pseudonormalization). Elevated left atrial pressure.  LV Wall Scoring: The apical septal segment, apical inferior segment, and  apex are dyskinetic. The mid and distal anterior wall and mid anteroseptal segment are hypokinetic. Right Ventricle: The right ventricular size is normal. No increase in right ventricular wall thickness. Right ventricular systolic function is normal. Left Atrium: Left atrial size was mild to moderately dilated. Right Atrium: Right atrial size was normal in size. Pericardium: Trivial pericardial effusion is present. Mitral Valve: The mitral valve is normal  in structure. Mild mitral valve regurgitation. No evidence of mitral valve stenosis. Tricuspid Valve: The tricuspid valve is normal in structure. Tricuspid valve regurgitation is not demonstrated. No evidence of tricuspid stenosis. Aortic Valve: The aortic valve is tricuspid. Aortic valve regurgitation is not visualized. No aortic stenosis is present. Aortic valve peak gradient measures 4.9 mmHg. Pulmonic Valve: The pulmonic valve was normal in structure. Pulmonic valve regurgitation is not visualized. No evidence of pulmonic stenosis. Aorta: The aortic root is normal in size and structure. Venous: The inferior vena cava is normal in size with greater than 50% respiratory variability, suggesting right atrial pressure of 3 mmHg. IAS/Shunts: No atrial level shunt detected by color flow Doppler.  LEFT VENTRICLE PLAX 2D LVIDd:         5.80 cm   Diastology LVIDs:         4.40 cm   LV e' medial:    4.80 cm/s LV PW:         1.70 cm   LV E/e' medial:  18.8 LV IVS:        1.50 cm   LV e' lateral:   5.33 cm/s LVOT diam:     2.00 cm   LV E/e' lateral: 16.9 LV SV:         51 LV SV Index:   24 LVOT Area:     3.14 cm  RIGHT VENTRICLE             IVC RV S prime:     13.70 cm/s  IVC diam: 2.00 cm TAPSE (M-mode): 2.9 cm LEFT ATRIUM             Index LA diam:        4.40 cm 2.09 cm/m LA Vol (A2C):   79.0 ml 37.43 ml/m LA Vol (A4C):   79.9 ml 37.88 ml/m LA Biplane Vol: 95.6 ml 45.33 ml/m  AORTIC VALVE                 PULMONIC VALVE AV Area (Vmax): 2.46 cm     PV Vmax:        0.95 m/s AV Vmax:        110.50 cm/s  PV Peak grad:  3.6 mmHg AV Peak Grad:   4.9 mmHg LVOT Vmax:      86.50 cm/s LVOT Vmean:     54.500 cm/s LVOT VTI:       0.163 m  AORTA Ao Root diam: 3.80 cm Ao Asc diam:  3.10 cm MITRAL VALVE MV Area (PHT): 4.17 cm    SHUNTS MV Decel Time: 182 msec    Systemic VTI:  0.16 m MV E velocity: 90.30 cm/s  Systemic Diam: 2.00 cm MV A velocity: 82.70 cm/s MV E/A ratio:  1.09 Mihai Croitoru MD Electronically signed by Sanda Klein MD Signature Date/Time: 01/06/2022/5:33:26 PM    Final    ECHO INTRAOPERATIVE TEE  Result Date: 01/08/2022  *INTRAOPERATIVE TRANSESOPHAGEAL REPORT *  Patient Name:   JACCOB MONDO Date of Exam: 01/08/2022 Medical Rec #:  [id number]     Height:       65.0 in Accession #:    [id number]    Weight:       231.5 lb Date of Birth:  01/23/1951    BSA:          2.10 m Patient Age:    70 years      BP:           117/50 mmHg Patient  Gender: M             HR:           54 bpm. Exam Location:  Inpatient Transesophogeal exam was perform intraoperatively during surgical procedure. Patient was closely monitored under general anesthesia during the entirety of examination. Indications:     I25.110 Atherosclerotic heart disease of native coronary artery                  with unstable angina pectoris Performing Phys: Suella Broad MD Diagnosing Phys: Suella Broad MD Complications: No known complications during this procedure. POST-OP IMPRESSIONS     s/p CABG x3 _ Left Ventricle: LVEF slightly improved from pre-bypass function, EF > 45%, RWMA's slightly improved. CO normal. _ Right Ventricle: The right ventricle appears unchanged from pre-bypass. _ Aorta: Aorta unchanged, no dissection noted after cannula removed. _ Left Atrium: The left atrium appears unchanged from pre-bypass. _ Left Atrial Appendage: The left atrial appendage appears unchanged from pre-bypass. _ Aortic Valve: The aortic valve appears unchanged from pre-bypass. _ Mitral Valve: The mitral valve appears unchanged  from pre-bypass. _ Tricuspid Valve: The tricuspid valve appears unchanged from pre-bypass. _ Pulmonic Valve: The pulmonic valve appears unchanged from pre-bypass. _ Interatrial Septum: The interatrial septum appears unchanged from pre-bypass. _ Interventricular Septum: The interventricular septum appears unchanged from pre-bypass. _ Pericardium: No pericardial effusion present, chest tubes in place. PRE-OP FINDINGS  Left Ventricle: The left ventricle has moderately reduced systolic function, with an ejection fraction of 35-40%. The cavity size was mildly dilated. Left ventricular diffuse hypokinesis. No intracardiac thrombi or masses were visualized. There is mild concentric left ventricular hypertrophy.  LV Wall Scoring: The moderately depressed function of the anteroseptal, inferior and inferoseptal wall. Antero-lateral wall, basal inferolateral segment are mildly hypokinetic.  Right Ventricle: The right ventricle has normal systolic function. The cavity was normal. There is no increase in right ventricular wall thickness. Left Atrium: Left atrial size was dilated. No left atrial/left atrial appendage thrombus was detected. The left atrial appendage is well visualized and there is no evidence of thrombus present. Left atrial appendage velocity is normal at greater than 40 cm/s. Right Atrium: Right atrial size was normal in size. Interatrial Septum: No atrial level shunt detected by color flow Doppler. There is no evidence of a patent foramen ovale. Pericardium: Trivial pericardial effusion is present. The pericardial effusion is circumferential. There is no pleural effusion. Mitral Valve: The mitral valve is normal in structure. Mitral valve regurgitation is not visualized by color flow Doppler. There is no evidence of mitral valve vegetation. There is No evidence of mitral stenosis. Tricuspid Valve: The tricuspid valve was normal in structure. Tricuspid valve regurgitation was not visualized by color flow  Doppler. No evidence of tricuspid stenosis is present. There is no evidence of tricuspid valve vegetation. Aortic Valve: The aortic valve is tricuspid Aortic valve regurgitation was not visualized by color flow Doppler. There is no stenosis of the aortic valve. There is no evidence of aortic valve vegetation. Pulmonic Valve: The pulmonic valve was normal in structure. Pulmonic valve regurgitation is not visualized by color flow Doppler. Aorta: The aortic root, ascending aorta and aortic arch are normal in size and structure. There is evidence of plaque in the descending aorta; Grade I, measuring 1-81mm in size. Pulmonary Artery: The pulmonary artery is of normal size. Venous: The inferior vena cava was not well visualized. Shunts: There is no evidence of an atrial septal defect.  Suella Broad MD Electronically  signed by Suella Broad MD Signature Date/Time: 01/08/2022/4:59:04 PM    Final    VAS US DOPPLER PRE CABG  Result Date: 01/08/2022 PREOPERATIVE VASCULAR EVALUATION Patient Name:  DENZIL PALKO  Date of Exam:   01/08/2022 Medical Rec #: [id number]      Accession #:    [id number] Date of Birth: October 12, 1951     Patient Gender: M Patient Age:   59 years Exam Location:  [facility] Procedure:      VAS US DOPPLER PRE CABG Referring Phys: HARRELL LIGHTFOOT --------------------------------------------------------------------------------  Indications:      Pre-CABG. Risk Factors:     Hypertension, Diabetes, prior CVA. Comparison Study: no prior Performing Technologist: Archie Patten RVS  Examination Guidelines: A complete evaluation includes B-mode imaging, spectral Doppler, color Doppler, and power Doppler as needed of all accessible portions of each vessel. Bilateral testing is considered an integral part of a complete examination. Limited examinations for reoccurring indications may be performed as noted.  Right Carotid Findings: +----------+--------+--------+--------+------------+--------+             PSV  cm/s EDV cm/s Stenosis Describe     Comments  +----------+--------+--------+--------+------------+--------+  CCA Prox   90       14                heterogenous           +----------+--------+--------+--------+------------+--------+  CCA Distal 76       14                heterogenous           +----------+--------+--------+--------+------------+--------+  ICA Prox   65       16       1-39%    heterogenous           +----------+--------+--------+--------+------------+--------+  ICA Distal 89       26                                       +----------+--------+--------+--------+------------+--------+  ECA        148      10                                       +----------+--------+--------+--------+------------+--------+ +----------+--------+-------+--------+------------+             PSV cm/s EDV cms Describe Arm Pressure  +----------+--------+-------+--------+------------+  Subclavian 86                                      +----------+--------+-------+--------+------------+ +---------+--------+--+--------+-+---------+  Vertebral PSV cm/s 42 EDV cm/s 8 Antegrade  +---------+--------+--+--------+-+---------+ Left Carotid Findings: +----------+--------+--------+--------+------------+--------+             PSV cm/s EDV cm/s Stenosis Describe     Comments  +----------+--------+--------+--------+------------+--------+  CCA Prox   89       9                 heterogenous           +----------+--------+--------+--------+------------+--------+  CCA Distal 79       7                 heterogenous           +----------+--------+--------+--------+------------+--------+  ICA Prox   72       19       1-39%    heterogenous           +----------+--------+--------+--------+------------+--------+  ICA Distal 65       23                                       +----------+--------+--------+--------+------------+--------+  ECA        116      10                                        +----------+--------+--------+--------+------------+--------+ +----------+--------+--------+--------+------------+  Subclavian PSV cm/s EDV cm/s Describe Arm Pressure  +----------+--------+--------+--------+------------+             117                                      +----------+--------+--------+--------+------------+ +---------+--------+--+--------+--+---------+  Vertebral PSV cm/s 63 EDV cm/s 21 Antegrade  +---------+--------+--+--------+--+---------+  ABI Findings: +--------+------------------+-----+---------+--------+  Right    Rt Pressure (mmHg) Index Waveform  Comment   +--------+------------------+-----+---------+--------+  Brachial                          triphasic           +--------+------------------+-----+---------+--------+  ATA                               triphasic           +--------+------------------+-----+---------+--------+  PTA                               triphasic           +--------+------------------+-----+---------+--------+ +--------+------------------+-----+---------+-------+  Left     Lt Pressure (mmHg) Index Waveform  Comment  +--------+------------------+-----+---------+-------+  Brachial                          triphasic          +--------+------------------+-----+---------+-------+  ATA                               triphasic          +--------+------------------+-----+---------+-------+  PTA                               triphasic          +--------+------------------+-----+---------+-------+  Right Doppler Findings: +--------+--------+-----+---------+--------+  Site     Pressure Index Doppler   Comments  +--------+--------+-----+---------+--------+  Brachial                triphasic           +--------+--------+-----+---------+--------+  Radial                  triphasic           +--------+--------+-----+---------+--------+  Ulnar  triphasic           +--------+--------+-----+---------+--------+  Left Doppler Findings:  +--------+--------+-----+---------+--------+  Site     Pressure Index Doppler   Comments  +--------+--------+-----+---------+--------+  Brachial                triphasic           +--------+--------+-----+---------+--------+  Radial                  triphasic           +--------+--------+-----+---------+--------+  Ulnar                   triphasic           +--------+--------+-----+---------+--------+  Summary: Right Carotid: Velocities in the right ICA are consistent with a 1-39% stenosis. Left Carotid: Velocities in the left ICA are consistent with a 1-39% stenosis. Vertebrals: Bilateral vertebral arteries demonstrate antegrade flow. Right Upper Extremity: Doppler waveforms remain within normal limits with right radial compression. Doppler waveforms remain within normal limits with right ulnar compression. Left Upper Extremity: Doppler waveforms remain within normal limits with left radial compression. Doppler waveform obliterate with left ulnar compression.  Electronically signed by Deitra Mayo MD on 01/08/2022 at 1:58:15 PM.    Final     Discharge Instructions     Amb Referral to Cardiac Rehabilitation   Complete by: As directed    Diagnosis:  CABG NSTEMI     CABG X ___: 3   After initial evaluation and assessments completed: Virtual Based Care may be provided alone or in conjunction with Phase 2 Cardiac Rehab based on patient barriers.: Yes     The patient has been discharged on:   1.Beta Blocker:  Yes [ x  ]                              No   [   ]                              If No, reason:  2.Ace Inhibitor/ARB: Yes [ x  ]                                     No  [    ]                                     If No, reason:  3.Statin:   Yes [ x  ]                  No  [   ]                  If No, reason:  4.Ecasa:  Yes  [x   ]                  No   [   ]                  If No, reason:  Patient had ACS upon admission:Yes  Plavix/P2Y12 inhibitor: Yes [   ]  No  [  x ] He is on Apixaban for LV thrombus  Discharge Medications: Allergies as of 01/14/2022       Reactions   Flomax [tamsulosin Hcl] Other (See Comments)   HYPOtension and syncope        Medication List     STOP taking these medications    aspirin 325 MG tablet Replaced by: aspirin 81 MG EC tablet   doxazosin 8 MG tablet Commonly known as: CARDURA   doxycycline 100 MG capsule Commonly known as: VIBRAMYCIN   ibuprofen 200 MG tablet Commonly known as: ADVIL       TAKE these medications    acetaminophen 500 MG tablet Commonly known as: TYLENOL Take 2 tablets (1,000 mg total) by mouth every 6 (six) hours as needed. What changed: reasons to take this   apixaban 5 MG Tabs tablet Commonly known as: ELIQUIS Take 1 tablet (5 mg total) by mouth 2 (two) times daily.   aspirin 81 MG EC tablet Take 1 tablet (81 mg total) by mouth daily. Swallow whole. Replaces: aspirin 325 MG tablet   atorvastatin 40 MG tablet Commonly known as: LIPITOR Take 1 tablet (40 mg total) by mouth daily at 6 PM. What changed:  medication strength how much to take   carvedilol 6.25 MG tablet Commonly known as: COREG Take 1 tablet (6.25 mg total) by mouth 2 (two) times daily with a meal.   furosemide 40 MG tablet Commonly known as: LASIX Take 1 tablet (40 mg total) by mouth daily.   levocetirizine 5 MG tablet Commonly known as: XYZAL Take 5 mg by mouth daily as needed for allergies.   losartan 100 MG tablet Commonly known as: COZAAR Take 100 mg by mouth daily.   metFORMIN 500 MG tablet Commonly known as: GLUCOPHAGE Take 1 tablet (500 mg total) by mouth at bedtime. What changed: when to take this   MUCUS RELIEF DM MAX PO Take 20 mLs by mouth every 4 (four) hours as needed (cough).   oxyCODONE 5 MG immediate release tablet Commonly known as: Oxy IR/ROXICODONE Take 1 tablet (5 mg total) by mouth every 6 (six) hours as needed for severe pain.   potassium  chloride SA 20 MEQ tablet Commonly known as: KLOR-CON M Take 1 tablet (20 mEq total) by mouth daily.   sertraline 100 MG tablet Commonly known as: ZOLOFT Take 100 mg by mouth daily.               Durable Medical Equipment  (From admission, onward)           Start     Ordered   01/12/22 0803  For home use only DME Walker rolling  Once       Question Answer Comment  Walker: With [employer] Wheels   Patient needs a walker to treat with the following condition Physical deconditioning      01/12/22 0802   01/12/22 0803  For home use only DME 3 n 1  Once        01/12/22 0802   01/07/22 1508  For home use only DME Bedside commode  Once       Question:  Patient needs a bedside commode to treat with the following condition  Answer:  Weakness   01/07/22 1507            Follow Up Appointments:  Follow-up Information     [facility] Follow up.   Specialty: Home Health Services Why: Home health  physical therapy- Agency will call you with apt times. Contact information: [address] [place]  [address] [phone]         [facility] Follow up.   Why: [facility] Contact information: [place] [address] [phone]         Lajuana Matte, MD Follow up.   Specialty: Cardiothoracic Surgery Why: Appointment is VIRTUAL (via telephone);please do NOT go to the office. Raymond [name] will call you on 03/24 at Contact information: [address] [place] [address] [phone]         Lenna Sciara, NP Follow up on 01/24/2022.   Specialties: Nurse Practitioner, Family Medicine Why: Cardiology Hospital Follow-up on 01/24/2022 at 10:15 AM. Contact information: [address] [place] [address] [phone]                 Signed: Sharalyn Ink [facility] 01/14/2022, 8:54 AM

## 2022-01-08 NOTE — Progress Notes (Signed)
FPTS Interim Night Progress Note ? ?S:Patient sleeping comfortably.  Rounded with primary night RN.  No concerns voiced.  No orders required.   ? ?O: ?Today's Vitals  ? 01/08/22 0045 01/08/22 0140 01/08/22 0500 01/08/22 0515  ?BP:  130/77 (!) 117/50   ?Pulse: (!) 56  60 (!) 53  ?Resp: 18 20 16 17   ?Temp:   97.7 ?F (36.5 ?C)   ?TempSrc:   Oral   ?SpO2: 96% 97% 95% 95%  ?Weight:   105 kg   ?Height:      ?PainSc:   0-No pain   ? ? ? ? ?A/P: ?Confusion likely opioid induced given. ?Continue to monitor and if worsening consider imaging. ? ? MD ?PGY-3, [facility] ?Service pager [phone]   ?

## 2022-01-08 NOTE — Anesthesia Preprocedure Evaluation (Addendum)
Anesthesia Evaluation  ?Patient identified by MRN, date of birth, ID band ?Patient awake ? ? ? ?Reviewed: ?Allergy & Precautions, NPO status , Patient's Chart, lab work & pertinent test results ? ?Airway ?Mallampati: III ? ?TM Distance: >3 FB ?Neck ROM: Full ? ? ? Dental ? ?(+) Teeth Intact, Dental Advisory Given ?  ?Pulmonary ?sleep apnea , COPD, Current Smoker and Patient abstained from smoking.,  ?  ?breath sounds clear to auscultation ? ? ? ? ? ? Cardiovascular ?hypertension, Pt. on medications ?+ CAD, + Past MI and +CHF  ? ?Rhythm:Regular Rate:Normal ? ?Echo: ?1. Suspicious for 14 x 5 mm thrombus (broad-based and fixed) at the level  ?of the inferior aspect of the apical left ventricular dyskinesia. Left  ?ventricular ejection fraction, by estimation, is 35 to 40%. The left  ?ventricle has mild to moderately  ?decreased function. The left ventricle demonstrates regional wall motion  ?abnormalities (see scoring diagram/findings for description). The left  ?ventricular internal cavity size was mildly dilated. There is moderate  ?concentric left ventricular  ?hypertrophy. Left ventricular diastolic parameters are consistent with  ?Grade II diastolic dysfunction (pseudonormalization). Elevated left atrial  ?pressure. There is moderate dyskinesis of the left ventricular, apical  ?segment. There is severe hypokinesis  ?of the left ventricular, mid-apical anterior wall and anteroseptal wall.  ??2. Right ventricular systolic function is normal. The right ventricular  ?size is normal.  ??3. Left atrial size was mild to moderately dilated.  ??4. The mitral valve is normal in structure. Mild mitral valve  ?regurgitation. No evidence of mitral stenosis.  ??5. The aortic valve is tricuspid. Aortic valve regurgitation is not  ?visualized. No aortic stenosis is present.  ??6. The inferior vena cava is normal in size with greater than 50%  ?respiratory variability, suggesting right atrial  pressure of 3 mmHg.  ?  ?Neuro/Psych ? Headaches, PSYCHIATRIC DISORDERS Depression CVA   ? GI/Hepatic ?negative GI ROS, Neg liver ROS,   ?Endo/Other  ?diabetes, Type 2, Oral Hypoglycemic Agents ? Renal/GU ?negative Renal ROS  ? ?  ?Musculoskeletal ? ?(+) Arthritis ,  ? Abdominal ?Normal abdominal exam  (+)   ?Peds ? Hematology ?negative hematology ROS ?(+)   ?Anesthesia Other Findings ? ? Reproductive/Obstetrics ? ?  ? ? ? ? ? ? ? ? ? ? ? ? ? ?  ?  ? ? ? ? ? ? ? ?Anesthesia Physical ?Anesthesia Plan ? ?ASA: 4 ? ?Anesthesia Plan: General  ? ?Post-op Pain Management:   ? ?Induction: Intravenous ? ?PONV Risk Score and Plan: 0 and Ondansetron ? ?Airway Management Planned: Oral ETT ? ?Additional Equipment: Arterial line, CVP, TEE and Ultrasound Guidance Line Placement ? ?Intra-op Plan:  ? ?Post-operative Plan: Post-operative intubation/ventilation ? ?Informed Consent: I have reviewed the patients History and Physical, chart, labs and discussed the procedure including the risks, benefits and alternatives for the proposed anesthesia with the patient or authorized representative who has indicated his/her understanding and acceptance.  ? ? ? ?Dental advisory given ? ?Plan Discussed with: CRNA ? ?Anesthesia Plan Comments:   ? ? ? ? ? ? ?Anesthesia Quick Evaluation ? ?

## 2022-01-08 NOTE — Anesthesia Procedure Notes (Deleted)
Arterial Line Insertion ?Start/End3/05/2022 2:50 PM, 01/08/2022 2:55 PM ?Performed by: De Nurse, CRNA ? Patient location: Pre-op. ?Preanesthetic checklist: patient identified, risks and benefits discussed and surgical consent ?Lidocaine 1% used for infiltration ?Left, radial was placed ?Catheter size: 20 G ?Hand hygiene performed  and maximum sterile barriers used  ?Cullinane's test indicative of satisfactory collateral circulation ?Attempts: 1 ?Procedure performed without using ultrasound guided technique. ?Following insertion, dressing applied and Biopatch. ?Post procedure assessment: normal ? ?Patient tolerated the procedure well with no immediate complications. ? ? ?

## 2022-01-08 NOTE — Anesthesia Procedure Notes (Signed)
Central Venous Catheter Insertion ?Performed by: Shelton Silvas, MD, anesthesiologist ?Start/End3/05/2022 10:30 AM, 01/08/2022 10:40 AM ?Patient location: Pre-op. ?Preanesthetic checklist: patient identified, IV checked, site marked, risks and benefits discussed, surgical consent, monitors and equipment checked, pre-op evaluation, timeout performed and anesthesia consent ?Position: Trendelenburg ?Lidocaine 1% used for infiltration and patient sedated ?Hand hygiene performed , maximum sterile barriers used  and Seldinger technique used ?Catheter size: 8.5 Fr ?Total catheter length 10. ?Central line was placed.Sheath introducer ?Swan type:thermodilution ?PA Cath depth:50 ?Procedure performed using ultrasound guided technique. ?Ultrasound Notes:anatomy identified, needle tip was noted to be adjacent to the nerve/plexus identified, no ultrasound evidence of intravascular and/or intraneural injection and image(s) printed for medical record ?Attempts: 1 ?Following insertion, line sutured, dressing applied and Biopatch. ?Post procedure assessment: blood return through all ports, free fluid flow and no air ? ?Patient tolerated the procedure well with no immediate complications. ? ? ? ?

## 2022-01-08 NOTE — Progress Notes (Addendum)
Family Medicine Teaching Service ?Daily Progress Note ?Intern Pager: [phone] ? ?Patient name: Raymond [name] Medical record number: [id number] ?Date of birth: [birth year] Age: 71 y.o. Gender: male ? ?Primary Care Provider: Charleston Poot, MD ?Consultants: Cardiology, CVTS, Heart Failure ?Code Status: Full ? ?Pt Overview and Major Events to Date:  ?3/4: Admitted ?3/5: LVEF 30-35%, severe wall motion abnormality, concern for small interventricular thrombus ?3/6: Critical left main coronary artery stenosis on coronary angiography ?3/7: Plan for CABG ? ?Assessment and Plan: ?Raymond [name] is a 71 year-old male who presented with acute HF exacerbation, found to have NSTEMI and critical left main and single-vessel coronary artery disease. ? ?S/p NSTEMI with critical 95% proximal left main artery stenosis ?Cardiac catheterization showed high grade distal left main disease with occluded mid LAD, 2-3 right to left collaterals and moderately severe LV dysfunction and apical mural thrombosis. Chest pain overnight (in left shoulder). Cardiology paged and patient given morphine and sublingual nitro. Stable this AM. A1c 5.6%. No further chest pain. VSS.Plan for CABG this morning with CVTS. ICU to resume care after surgery. ?- Open-heart CABG with CVTS scheduled for this morning ?- Cefazolin and Vancomycin surgical ppx ?- Cardiology following ?- Heparin gtt ?- Nitro PRN chest pain 0.4mg  sublingual ? ?Acute HFrEF exacerbation; LVEF 35-40% with GIIDD ?Breathing well, on room air. S/p IV Lasix diuresis. UOP 1.45L past 24 hours. I/O net negative 1250 since admission. ?- Monitor VS ?- IV Lasix 20mg  BID ?- Cards following ? ?Hypertension ?BP stable. Most recent 130s-140s/60s-70s. No additional antihypertensive needed overnight to control pressure. ?- Monitor VS ?- If systolic > [id number], schedule IV hydralazine ?- Continue spironolactone ?- Holding losartan given AKI ? ? ?Other conditions chronic and stable ?OSA (continue CPAP), T2DM,  Hyperlipidemia ? ?FEN/GI: NPO ?PPx: Heparin gtt ?Dispo:Home with home health  in 3 or more days. Barriers include CABG, post-surgery care.  ? ?Subjective:  ?Feeling well. Wife and daughter at bedside. No chest pain. Was sleeping comfortably with CPAP before my exam. ? ?Objective: ?Temp:  [97.7 ?F (36.5 ?C)-98.5 ?F (36.9 ?C)] 97.7 ?F (36.5 ?C) (03/07 0500) ?Pulse Rate:  [53-66] 53 (03/07 0515) ?Resp:  [16-21] 17 (03/07 0515) ?BP: (117-166)/(50-82) 117/50 (03/07 0500) ?SpO2:  [95 %-100 %] 95 % (03/07 0515) ?Weight:  [105 kg] 105 kg (03/07 0500) ?Physical Exam: ?General: Resting comfortably in bed ?Cardiovascular: RRR, HR in 60s ?Respiratory: Clear, no wheezing or crackles ?Abdomen: Soft, obese, non-tender ?Extremities: No edema ? ?Laboratory: ?Recent Labs  ?Lab 01/05/22 ?1627 01/07/22 ?0336 01/08/22 ?0615  ?WBC 5.7 6.5 6.2  ?HGB 14.3 14.0 13.7  ?HCT 41.1 39.3 38.6*  ?PLT 166 163 159  ? ?Recent Labs  ?Lab 01/06/22 ?0146 01/06/22 ?0505 01/07/22 ?0336  ?NA 140 141 138  ?K 3.2* 3.2* 3.5  ?CL 105 106 104  ?CO2 27 26 25   ?BUN 12 12 10   ?CREATININE 1.28* 1.25* 1.34*  ?CALCIUM 8.6* 8.6* 8.5*  ?GLUCOSE 181* 115* 117*  ? ? ? ?Imaging/Diagnostic Tests: ?CARDIAC CATHETERIZATION ? ?Result Date: 01/07/2022 ?Images from the original result were not included.   Ost LM to Mid LM lesion is 95% stenosed.   Mid LAD lesion is 100% stenosed.   2nd Diag lesion is 95% stenosed. Raymond [name] is a 71 y.o. male  [id number] LOCATION:  FACILITY: [facility] PHYSICIAN: Quay Burow, M.D. Oct 30, 1951 DATE OF PROCEDURE:  01/07/2022 DATE OF DISCHARGE: CARDIAC CATHETERIZATION History obtained from chart review.71 y.o. male with diabetes, obesity, hypertension, stroke, PAF, hyperlipidemia, and OSA on CPAP admitted with  acute systolic and diastolic heart failure.  By history, it appears he had an out of hospital anterior MI proxy a week ago.  His EF is in the 25 to 30% range.  He was diuresed and medically optimized.  He is now able to lie flat.  He said no  further chest pain.  He does have segmental wall motion abnormalities consistent with myocardial infarction in the LAD territory.  He presents now for diagnostic coronary angiography.  Of note, he did have an apical mural thrombus by [facility] echocardiography.  ? ?[name] has high-grade distal left main disease with occluded mid LAD, grade 2-3 right to left collaterals and moderately severe LV dysfunction for apical mural thrombus.  It is possible that he is still has viability in the LAD territory given his significant collaterals.  He will need coronary artery bypass grafting.  I will restart heparin 4 hours after sheath removal.  The radial sheath was removed and a TR band was placed on the right wrist to achieve patent hemostasis.  The patient left lab in stable condition.  T CTS has been notified. Quay Burow. MD, [facility] 01/07/2022 2:03 PM    ? ? ?Orvis Brill, DO ?01/08/2022, 6:59 AM ?PGY-1, [place] ?[place] Intern pager: [phone], text pages welcome ? ?

## 2022-01-08 NOTE — Care Management Important Message (Signed)
Important Message ? ?Patient Details  ?Name: Raymond [name] ?MRN: [id number] ?Date of Birth: 1951/08/11 ? ? ?Medicare Important Message Given:  Yes ? ? ? ? ?[name] ?01/08/2022, 8:06 AM ?

## 2022-01-08 NOTE — Progress Notes (Signed)
Patient ID: Raymond [name], male   DOB: 27-Jul-1951, 71 y.o.   MRN: [id number] ? ?TCTS Evening Rounds:  ? ?Hemodynamically stable  ?CI = 2.2 ? ?Has started to wake up on vent.  ? ?Urine output good  ?CT output low ? ?CBC ?   ?Component Value Date/Time  ? WBC 3.9 (L) 01/08/2022 1618  ? RBC 2.27 (L) 01/08/2022 1618  ? HGB 6.9 (LL) 01/08/2022 1618  ? HGB 15.5 10/09/2015 0000  ? HCT 20.5 (L) 01/08/2022 1618  ? HCT 46.3 10/09/2015 0000  ? PLT 62 (L) 01/08/2022 1618  ? PLT 178 10/09/2015 0000  ? MCV 90.3 01/08/2022 1618  ? MCV 90 10/09/2015 0000  ? MCH 30.4 01/08/2022 1618  ? MCHC 33.7 01/08/2022 1618  ? RDW 13.5 01/08/2022 1618  ? RDW 13.7 10/09/2015 0000  ? LYMPHSABS 1.9 07/10/2017 1246  ? LYMPHSABS 2.1 10/09/2015 0000  ? MONOABS 0.6 07/10/2017 1246  ? EOSABS 0.1 07/10/2017 1246  ? EOSABS 0.2 10/09/2015 0000  ? BASOSABS 0.0 07/10/2017 1246  ? BASOSABS 0.0 10/09/2015 0000  ? ? ? ?BMET ?   ?Component Value Date/Time  ? NA 139 01/08/2022 1504  ? NA 136 10/09/2015 0000  ? NA 137 10/09/2015 0000  ? K 3.9 01/08/2022 1504  ? CL 103 01/08/2022 1504  ? CO2 26 01/08/2022 0615  ? GLUCOSE 137 (H) 01/08/2022 1504  ? BUN 12 01/08/2022 1504  ? BUN 10 10/09/2015 0000  ? BUN 11 10/09/2015 0000  ? CREATININE 1.00 01/08/2022 1504  ? CALCIUM 8.6 (L) 01/08/2022 0615  ? GFRNONAA 56 (L) 01/08/2022 0615  ? ? ? ?A/P:  Stable postop course. Continue current plans. Transfusing 1 unit PRBC's for Hgb 6.9. Plts 62K but not bleeding so will repeat in am. ? ?

## 2022-01-08 NOTE — Progress Notes (Signed)
Pre cabg has been completed.  ? ?Preliminary results in CV Proc.  ? ?Aine Strycharz Reshad Saab ?01/08/2022 9:25 AM    ?

## 2022-01-08 NOTE — Progress Notes (Signed)
ANTICOAGULATION CONSULT NOTE - Follow Up Consult ? ?Pharmacy Consult for Heparin ?Indication:  Ventricular Thrombus ? ?Allergies  ?Allergen Reactions  ? Flomax [Tamsulosin Hcl] Other (See Comments)  ?  HYPOtension and syncope  ? ? ?Patient Measurements: ?Height: 5\' 5"  (165.1 cm) ?Weight: 105 kg (231 lb 7.7 oz) ?IBW/kg (Calculated) : 61.5 ?Heparin Dosing Weight: 85kg ? ?Vital Signs: ?Temp: 97.7 ?F (36.5 ?C) (03/07 0500) ?Temp Source: Oral (03/07 0500) ?BP: 117/50 (03/07 0500) ?Pulse Rate: 53 (03/07 0515) ? ?Labs: ?Recent Labs  ?  01/05/22 ?1627 01/05/22 ?1758 01/06/22 ?0146 01/06/22 ?03/08/22 01/06/22 ?0505 01/07/22 ?03/09/22 01/07/22 ?1039 01/08/22 ?0615  ?HGB 14.3  --   --   --   --  14.0  --  13.7  ?HCT 41.1  --   --   --   --  39.3  --  38.6*  ?PLT 166  --   --   --   --  163  --  159  ?APTT  --   --   --   --   --   --   --  60*  ?LABPROT  --   --   --   --   --   --   --  14.5  ?INR  --   --   --   --   --   --   --  1.1  ?HEPARINUNFRC  --   --   --   --   --  0.13* 0.37 0.19*  ?CREATININE 1.24  --  1.28*  --  1.25* 1.34*  --  1.34*  ?TROPONINIHS 34* 38* 95* 97*  --   --   --   --   ? ? ? ?Estimated Creatinine Clearance: 55.6 mL/min (A) (by C-G formula based on SCr of 1.34 mg/dL (H)). ? ? ?Medications:  ?Infusions:  ? sodium chloride    ?  ceFAZolin (ANCEF) IV    ?  ceFAZolin (ANCEF) IV    ? dexmedetomidine    ? heparin 30,000 units/NS 1000 mL solution for CELLSAVER    ? heparin 1,650 Units/hr (01/07/22 1810)  ? milrinone    ? nitroGLYCERIN    ? norepinephrine    ? tranexamic acid (CYKLOKAPRON) infusion ([facility])    ? vancomycin    ? ? ?Assessment: ?Raymond [name] is a 71 y/o main who presented with cough/shortness of breath. ECHO was performed and large LV thrombus was found. Heparin started for anticoagulation. LHC performed 3/6, restarted heparin gtt4 hours after sheath removal (1348). TCTS assessing for CABG this hospitalization, plan for today.  ? ?Hgb stable, HCT low, trending down. PLTs WNL. Heparin level 0.19  subtherapeutic with heparin running at 1,650 units/hr. No bleeding noted.  ? ?Goal of Therapy:  ?Heparin level 0.3-0.7 units/ml ?Monitor platelets by anticoagulation protocol: Yes ?  ?Plan:  ?Increase heparin rate to 1,900 units/hr. ?Continue to monitor H&H and platelets. Monitor for s/sx of bleeding/clotting. Monitor CBC and heparin level daily.  ? ?59, PharmD Candidate ?01/08/2022,7:04 AM ? ? ?

## 2022-01-08 NOTE — Progress Notes (Signed)
?  Echocardiogram ?Echocardiogram Transesophageal has been performed. ? ?Janalyn Harder ?01/08/2022, 11:56 AM ?

## 2022-01-08 NOTE — Progress Notes (Signed)
Progress Note  Patient Name: LINUS COMITO Date of Encounter: 01/08/2022  [facility] Cardiologist: None   Subjective   Denies chest pain.  Ready for CABG.  Inpatient Medications    Scheduled Meds:  aspirin  81 mg Oral Daily   atorvastatin  20 mg Oral q1800   bisacodyl  5 mg Oral Once   carvedilol  3.125 mg Oral BID WC   chlorhexidine  15 mL Mouth/Throat Once   Chlorhexidine Gluconate Cloth  6 each Topical Once   doxazosin  12 mg Oral QHS   epinephrine  0-10 mcg/min Intravenous To OR   furosemide  20 mg Intravenous BID   heparin-papaverine-plasmalyte irrigation   Irrigation To OR   insulin   Intravenous To OR   [facility] Blood Cardioplegia vial (lidocaine/magnesium/mannitol 0.26g-4g-6.4g)   Intracoronary To OR   metoprolol tartrate  12.5 mg Oral Once   phenylephrine  30-200 mcg/min Intravenous To OR   potassium chloride  80 mEq Other To OR   sacubitril-valsartan  1 tablet Oral BID   sertraline  100 mg Oral Daily   sodium chloride flush  3 mL Intravenous Q12H   spironolactone  25 mg Oral Daily   tranexamic acid  15 mg/kg Intravenous To OR   tranexamic acid  2 mg/kg Intracatheter To OR   Continuous Infusions:  sodium chloride      ceFAZolin (ANCEF) IV      ceFAZolin (ANCEF) IV     dexmedetomidine     heparin 30,000 units/NS 1000 mL solution for CELLSAVER     heparin 1,900 Units/hr (01/08/22 0904)   milrinone     nitroGLYCERIN     norepinephrine     tranexamic acid (CYKLOKAPRON) infusion ([facility])     vancomycin     PRN Meds: sodium chloride, acetaminophen, albuterol, morphine injection, nitroGLYCERIN, ondansetron (ZOFRAN) IV, polyethylene glycol, sodium chloride flush, temazepam   Vital Signs    Vitals:   01/08/22 0045 01/08/22 0140 01/08/22 0500 01/08/22 0515  BP:  130/77 (!) 117/50   Pulse: (!) 56  60 (!) 53  Resp: 18 20 16 17   Temp:   97.7 F (36.5 C)   TempSrc:   Oral   SpO2: 96% 97% 95% 95%  Weight:   105 kg   Height:        Intake/Output  Summary (Last 24 hours) at 01/08/2022 1006 Last data filed at 01/08/2022 0600 Gross per 24 hour  Intake 1985.12 ml  Output 800 ml  Net 1185.12 ml   Last 3 Weights 01/08/2022 01/07/2022 01/06/2022  Weight (lbs) 231 lb 7.7 oz 231 lb 7.7 oz 232 lb 9.4 oz  Weight (kg) 105 kg 105 kg 105.5 kg  Some encounter information is confidential and restricted. Go to Review Flowsheets activity to see all data.      Telemetry    Sinus rhythm.  PVCs - Personally Reviewed  ECG    Sinus rhythm.  Rate 63.  Nonspecific T wave abnormalities. - Personally Reviewed  Physical Exam   VS:  BP (!) 117/50    Pulse (!) 53    Temp 97.7 F (36.5 C) (Oral)    Resp 17    Ht 5\' 5"  (1.651 m)    Wt 105 kg    SpO2 95%    BMI 38.52 kg/m  , BMI Body mass index is 38.52 kg/m. GENERAL:  Well appearing HEENT: Pupils equal round and reactive, fundi not visualized, oral mucosa unremarkable NECK:  No jugular venous distention, waveform  within normal limits, carotid upstroke brisk and symmetric, no bruits, no thyromegaly LUNGS:  Clear to auscultation bilaterally HEART:  RRR.  PMI not displaced or sustained,S1 and S2 within normal limits, no S3, no S4, no clicks, no rubs, no murmurs ABD:  Flat, positive bowel sounds normal in frequency in pitch, no bruits, no rebound, no guarding, no midline pulsatile mass, no hepatomegaly, no splenomegaly EXT:  2 plus pulses throughout, no edema, no cyanosis no clubbing SKIN:  No rashes no nodules NEURO:  Cranial nerves II through XII grossly intact, motor grossly intact throughout PSYCH:  Cognitively intact, oriented to person place and time   Labs    High Sensitivity Troponin:   Recent Labs  Lab 01/05/22 1627 01/05/22 1758 01/06/22 0146 01/06/22 0437  TROPONINIHS 34* 38* 95* 97*     Chemistry Recent Labs  Lab 01/06/22 0505 01/07/22 0336 01/08/22 0615  NA 141 138 137  K 3.2* 3.5 3.7  CL 106 104 103  CO2 26 25 26   GLUCOSE 115* 117* 125*  BUN 12 10 16   CREATININE 1.25* 1.34*  1.34*  CALCIUM 8.6* 8.5* 8.6*  GFRNONAA >60 56* 56*  ANIONGAP 9 9 8     Lipids  Recent Labs  Lab 01/06/22 0146  CHOL 124  TRIG 111  HDL 35*  LDLCALC 67  CHOLHDL 3.5    Hematology Recent Labs  Lab 01/05/22 1627 01/07/22 0336 01/08/22 0615  WBC 5.7 6.5 6.2  RBC 4.71 4.65 4.58  HGB 14.3 14.0 13.7  HCT 41.1 39.3 38.6*  MCV 87.3 84.5 84.3  MCH 30.4 30.1 29.9  MCHC 34.8 35.6 35.5  RDW 13.5 13.3 13.5  PLT 166 163 159   Thyroid No results for input(s): TSH, FREET4 in the last 168 hours.  BNP Recent Labs  Lab 01/05/22 1748  BNP 1,096.9*    DDimer No results for input(s): DDIMER in the last 168 hours.   Radiology    CARDIAC CATHETERIZATION  Result Date: 01/07/2022 Images from the original result were not included.   Ost LM to Mid LM lesion is 95% stenosed.   Mid LAD lesion is 100% stenosed.   2nd Diag lesion is 95% stenosed. KENTRELL LANGFORD is a 71 y.o. male  [id number] LOCATION:  FACILITY: [facility] PHYSICIAN: Quay Burow, M.D. 07/26/1951 DATE OF PROCEDURE:  01/07/2022 DATE OF DISCHARGE: CARDIAC CATHETERIZATION History obtained from chart review.71 y.o. male with diabetes, obesity, hypertension, stroke, PAF, hyperlipidemia, and OSA on CPAP admitted with acute systolic and diastolic heart failure.  By history, it appears he had an out of hospital anterior MI proxy a week ago.  His EF is in the 25 to 30% range.  He was diuresed and medically optimized.  He is now able to lie flat.  He said no further chest pain.  He does have segmental wall motion abnormalities consistent with myocardial infarction in the LAD territory.  He presents now for diagnostic coronary angiography.  Of note, he did have an apical mural thrombus by [facility] echocardiography.   Mr. 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  It is possible that he is still has viability in the LAD territory  given his significant collaterals.  He will need coronary artery bypass grafting.  I will restart heparin 4 hours after sheath removal.  The radial sheath was removed and a TR band was placed on the right wrist to achieve patent  hemostasis.  The patient left lab in stable condition.  T CTS has been notified. Quay Burow. MD, [facility] 01/07/2022 2:03 PM    ECHOCARDIOGRAM COMPLETE  Result Date: 01/06/2022    ECHOCARDIOGRAM REPORT   Patient Name:   OLUWADEMILADE RICCITELLI Date of Exam: 01/06/2022 Medical Rec #:  [id number]     Height:       65.0 in Accession #:    [id number]    Weight:       232.6 lb Date of Birth:  1951-04-29    BSA:          2.109 m Patient Age:    75 years      BP:           154/81 mmHg Patient Gender: M             HR:           61 bpm. Exam Location:  Inpatient Procedure: 2D Echo Indications:    CHF  History:        Patient has prior history of Echocardiogram examinations, most                 recent 06/24/2017. CHF; Risk Factors:Hypertension and Diabetes.  Sonographer:    Jefferey Pica Referring Phys: [id number] [place]  1. Suspicious for 14 x 5 mm thrombus (broad-based and fixed) at the level of the inferior aspect of the apical left ventricular dyskinesia. Left ventricular ejection fraction, by estimation, is 35 to 40%. The left ventricle has mild to moderately decreased function. The left ventricle demonstrates regional wall motion abnormalities (see scoring diagram/findings for description). The left ventricular internal cavity size was mildly dilated. There is moderate concentric left ventricular hypertrophy. Left ventricular diastolic parameters are consistent with Grade II diastolic dysfunction (pseudonormalization). Elevated left atrial pressure. There is moderate dyskinesis of the left ventricular, apical segment. There is severe hypokinesis of the left ventricular, mid-apical anterior wall and anteroseptal wall.  2. Right ventricular systolic function is normal. The right  ventricular size is normal.  3. Left atrial size was mild to moderately dilated.  4. The mitral valve is normal in structure. Mild mitral valve regurgitation. No evidence of mitral stenosis.  5. The aortic valve is tricuspid. Aortic valve regurgitation is not visualized. No aortic stenosis is present.  6. The inferior vena cava is normal in size with greater than 50% respiratory variability, suggesting right atrial pressure of 3 mmHg. Comparison(s): Prior images unable to be directly viewed, comparison made by report only. FINDINGS  Left Ventricle: Suspicious for 14 x 5 mm thrombus (broad-based and fixed) at the level of the inferior aspect of the apical left ventricular dyskinesia. Left ventricular ejection fraction, by estimation, is 35 to 40%. The left ventricle has mild to moderately decreased function. The left ventricle demonstrates regional wall motion abnormalities. Severe hypokinesis of the left ventricular, mid-apical anterior wall and anteroseptal wall. The left ventricular internal cavity size was mildly dilated. There is moderate concentric left ventricular hypertrophy. Left ventricular diastolic parameters are consistent with Grade II diastolic dysfunction (pseudonormalization). Elevated left atrial pressure.  LV Wall Scoring: The apical septal segment, apical inferior segment, and apex are dyskinetic. The mid and distal anterior wall and mid anteroseptal segment are hypokinetic. Right Ventricle: The right ventricular size is normal. No increase in right ventricular wall thickness. Right ventricular systolic function is normal. Left Atrium: Left atrial size was mild to moderately dilated. Right Atrium: Right atrial size was normal in size. Pericardium:  Trivial pericardial effusion is present. Mitral Valve: The mitral valve is normal in structure. Mild mitral valve regurgitation. No evidence of mitral valve stenosis. Tricuspid Valve: The tricuspid valve is normal in structure. Tricuspid valve  regurgitation is not demonstrated. No evidence of tricuspid stenosis. Aortic Valve: The aortic valve is tricuspid. Aortic valve regurgitation is not visualized. No aortic stenosis is present. Aortic valve peak gradient measures 4.9 mmHg. Pulmonic Valve: The pulmonic valve was normal in structure. Pulmonic valve regurgitation is not visualized. No evidence of pulmonic stenosis. Aorta: The aortic root is normal in size and structure. Venous: The inferior vena cava is normal in size with greater than 50% respiratory variability, suggesting right atrial pressure of 3 mmHg. IAS/Shunts: No atrial level shunt detected by color flow Doppler.  LEFT VENTRICLE PLAX 2D LVIDd:         5.80 cm   Diastology LVIDs:         4.40 cm   LV e' medial:    4.80 cm/s LV PW:         1.70 cm   LV E/e' medial:  18.8 LV IVS:        1.50 cm   LV e' lateral:   5.33 cm/s LVOT diam:     2.00 cm   LV E/e' lateral: 16.9 LV SV:         51 LV SV Index:   24 LVOT Area:     3.14 cm  RIGHT VENTRICLE             IVC RV S prime:     13.70 cm/s  IVC diam: 2.00 cm TAPSE (M-mode): 2.9 cm LEFT ATRIUM             Index LA diam:        4.40 cm 2.09 cm/m LA Vol (A2C):   79.0 ml 37.43 ml/m LA Vol (A4C):   79.9 ml 37.88 ml/m LA Biplane Vol: 95.6 ml 45.33 ml/m  AORTIC VALVE                 PULMONIC VALVE AV Area (Vmax): 2.46 cm     PV Vmax:       0.95 m/s AV Vmax:        110.50 cm/s  PV Peak grad:  3.6 mmHg AV Peak Grad:   4.9 mmHg LVOT Vmax:      86.50 cm/s LVOT Vmean:     54.500 cm/s LVOT VTI:       0.163 m  AORTA Ao Root diam: 3.80 cm Ao Asc diam:  3.10 cm MITRAL VALVE MV Area (PHT): 4.17 cm    SHUNTS MV Decel Time: 182 msec    Systemic VTI:  0.16 m MV E velocity: 90.30 cm/s  Systemic Diam: 2.00 cm MV A velocity: 82.70 cm/s MV E/A ratio:  1.09 Mihai Croitoru MD Electronically signed by Sanda Klein MD Signature Date/Time: 01/06/2022/5:33:26 PM    Final    VAS US DOPPLER PRE CABG  Result Date: 01/08/2022 PREOPERATIVE VASCULAR EVALUATION Patient Name:   CAMRY FALER  Date of Exam:   01/08/2022 Medical Rec #: [id number]      Accession #:    [id number] Date of Birth: 03-Oct-1951     Patient Gender: M Patient Age:   14 years Exam Location:  [facility] Procedure:      VAS US DOPPLER PRE CABG Referring Phys: HARRELL LIGHTFOOT --------------------------------------------------------------------------------  Indications:      Pre-CABG. Risk Factors:  Hypertension, Diabetes, prior CVA. Comparison Study: no prior Performing Technologist: Archie Patten RVS  Examination Guidelines: A complete evaluation includes B-mode imaging, spectral Doppler, color Doppler, and power Doppler as needed of all accessible portions of each vessel. Bilateral testing is considered an integral part of a complete examination. Limited examinations for reoccurring indications may be performed as noted.  Right Carotid Findings: +----------+--------+--------+--------+------------+--------+             PSV cm/s EDV cm/s Stenosis Describe     Comments  +----------+--------+--------+--------+------------+--------+  CCA Prox   90       14                heterogenous           +----------+--------+--------+--------+------------+--------+  CCA Distal 76       14                heterogenous           +----------+--------+--------+--------+------------+--------+  ICA Prox   65       16       1-39%    heterogenous           +----------+--------+--------+--------+------------+--------+  ICA Distal 89       26                                       +----------+--------+--------+--------+------------+--------+  ECA        148      10                                       +----------+--------+--------+--------+------------+--------+ +----------+--------+-------+--------+------------+             PSV cm/s EDV cms Describe Arm Pressure  +----------+--------+-------+--------+------------+  Subclavian 86                                      +----------+--------+-------+--------+------------+  +---------+--------+--+--------+-+---------+  Vertebral PSV cm/s 42 EDV cm/s 8 Antegrade  +---------+--------+--+--------+-+---------+ Left Carotid Findings: +----------+--------+--------+--------+------------+--------+             PSV cm/s EDV cm/s Stenosis Describe     Comments  +----------+--------+--------+--------+------------+--------+  CCA Prox   89       9                 heterogenous           +----------+--------+--------+--------+------------+--------+  CCA Distal 79       7                 heterogenous           +----------+--------+--------+--------+------------+--------+  ICA Prox   72       19       1-39%    heterogenous           +----------+--------+--------+--------+------------+--------+  ICA Distal 65       23                                       +----------+--------+--------+--------+------------+--------+  ECA        116      10                                       +----------+--------+--------+--------+------------+--------+ +----------+--------+--------+--------+------------+  Subclavian PSV cm/s EDV cm/s Describe Arm Pressure  +----------+--------+--------+--------+------------+             117                                      +----------+--------+--------+--------+------------+ +---------+--------+--+--------+--+---------+  Vertebral PSV cm/s 63 EDV cm/s 21 Antegrade  +---------+--------+--+--------+--+---------+  ABI Findings: +--------+------------------+-----+---------+--------+  Right    Rt Pressure (mmHg) Index Waveform  Comment   +--------+------------------+-----+---------+--------+  Brachial                          triphasic           +--------+------------------+-----+---------+--------+  ATA                               triphasic           +--------+------------------+-----+---------+--------+  PTA                               triphasic           +--------+------------------+-----+---------+--------+ +--------+------------------+-----+---------+-------+  Left     Lt  Pressure (mmHg) Index Waveform  Comment  +--------+------------------+-----+---------+-------+  Brachial                          triphasic          +--------+------------------+-----+---------+-------+  ATA                               triphasic          +--------+------------------+-----+---------+-------+  PTA                               triphasic          +--------+------------------+-----+---------+-------+  Right Doppler Findings: +--------+--------+-----+---------+--------+  Site     Pressure Index Doppler   Comments  +--------+--------+-----+---------+--------+  Brachial                triphasic           +--------+--------+-----+---------+--------+  Radial                  triphasic           +--------+--------+-----+---------+--------+  Ulnar                   triphasic           +--------+--------+-----+---------+--------+  Left Doppler Findings: +--------+--------+-----+---------+--------+  Site     Pressure Index Doppler   Comments  +--------+--------+-----+---------+--------+  Brachial                triphasic           +--------+--------+-----+---------+--------+  Radial                  triphasic           +--------+--------+-----+---------+--------+  Ulnar                   triphasic           +--------+--------+-----+---------+--------+  Summary: Right Carotid: Velocities in the right ICA are consistent with a 1-39% stenosis. Left Carotid: Velocities  in the left ICA are consistent with a 1-39% stenosis. Vertebrals: Bilateral vertebral arteries demonstrate antegrade flow. Right Upper Extremity: Doppler waveforms remain within normal limits with right radial compression. Doppler waveforms remain within normal limits with right ulnar compression. Left Upper Extremity: Doppler waveforms remain within normal limits with left radial compression. Doppler waveform obliterate with left ulnar compression.     Preliminary     Cardiac Studies   Echo 01/07/22:  1. Suspicious for 14 x 5 mm thrombus  (broad-based and fixed) at the level  of the inferior aspect of the apical left ventricular dyskinesia. Left  ventricular ejection fraction, by estimation, is 35 to 40%. The left  ventricle has mild to moderately  decreased function. The left ventricle demonstrates regional wall motion  abnormalities (see scoring diagram/findings for description). The left  ventricular internal cavity size was mildly dilated. There is moderate  concentric left ventricular  hypertrophy. Left ventricular diastolic parameters are consistent with  Grade II diastolic dysfunction (pseudonormalization). Elevated left atrial  pressure. There is moderate dyskinesis of the left ventricular, apical  segment. There is severe hypokinesis  of the left ventricular, mid-apical anterior wall and anteroseptal wall.   2. Right ventricular systolic function is normal. The right ventricular  size is normal.   3. Left atrial size was mild to moderately dilated.   4. The mitral valve is normal in structure. Mild mitral valve  regurgitation. No evidence of mitral stenosis.   5. The aortic valve is tricuspid. Aortic valve regurgitation is not  visualized. No aortic stenosis is present.   6. The inferior vena cava is normal in size with greater than 50%  respiratory variability, suggesting right atrial pressure of 3 mmHg.   LHC 01/07/22:   Ost LM to Mid LM lesion is 95% stenosed.   Mid LAD lesion is 100% stenosed.   2nd Diag lesion is 95% stenosed.   Diagnostic Dominance: Right   Patient Profile     71 y.o. male with diabetes, obesity, hypertension, stroke, PAF, hyperlipidemia, and OSA on CPAP admitted with NSTEMI and acute systolic and diastolic heart failure.  Found to have critical LM disease on cath.   Assessment & Plan    #NSTEMI: # Hyperlipidemia: Patient likely had an out of hospital MI that presented mostly as indigestion.  Echo now with acute systolic and diastolic heart failure.  Switched aspirin to 81mg ,  especially given that he will need anticoagulation for LV thrombus.  He was found to have critical left main disease.  Going for CABG today with Dr. Kipp Brood.  Carvedilol was started this admission.  Continue aspirin, carvedilol, and atorvastatin.  Add low dose carvedilol and continue atorvastatin.  LDL was 67 on admission.  Given his progressive coronary disease despite adequate lipid control, will lower his goal to 55.  Increase atorvastatin to 40 mg.  He will need repeat lipids and a CMP in 2 to 3 months.  #Acute systolic diastolic heart failure: Echo this admission with LVEF 35-40% down from 45% previously.  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  Going for CABG today as above.  Continue carvedilol, spironolactone, and Entresto once hemodynamically stable after CABG.  # LV thrombus:  Continue heparin.  Transition to warfarin when able post CABG.  # Hypertension:  BP better controlled.  His losartan was which to [facility] and carvedilol was added.  We will need to monitor and likely adjust meds postoperatively.   # Obesity:  Consider Ozempic.  Needs cardiac rehab at discharge.   #  DM:  Will need Jardiance/Farxiga given new HF.      For questions or updates, please contact [place] Please consult [email or web address] for contact info under        Signed, Skeet Latch, MD  01/08/2022, 10:06 AM

## 2022-01-08 NOTE — Brief Op Note (Signed)
01/05/2022 - 01/08/2022 ? ?2:28 PM ? ?PATIENT:  Raymond [name]  71 y.o. male ? ?PRE-OPERATIVE DIAGNOSIS:  1. S/p NSTEMI 2.Coronary artery disease ? ?POST-OPERATIVE DIAGNOSIS:  1. S/p NSTEMI 2.Coronary artery disease ? ?PROCEDURE: TRANSESOPHAGEAL ECHOCARDIOGRAM (TEE), CORONARY ARTERY BYPASS GRAFTING (CABG) TIMES THREE (LIMA to LAD, SVG to DIAGONAL, SVG to OM1) USING LEFT INTERNAL MAMMARY ARTERY AND ENDOSCOPICALLY HARVESTED BILATERAL GREATER SAPHENOUS VEINS SAPHENOUS VEINS ?RIGHT EVH HARVEST TIME: 20 minutes;RIGHT EVH PREP TIME: 12 minutes ?LEFT EVH HARVEST TIME: 14 minutes;LEFT EVH PREP TIME: 8 minutes ? ?SURGEON:  Surgeon(s) and Role: ?   Lightfoot, Eliezer Lofts, MD - Primary ? ?PHYSICIAN ASSISTANT: Doree Fudge PA-C ? ?ASSISTANTS: Mardi Mainland RNFA  ? ?ANESTHESIA:   general ? ?DRAINS:  Chest tubes placed in the mediastinal and pleural spaces   ? ?COUNTS CORRECT:  YES ? ?DICTATION: .Dragon Dictation ? ?PLAN OF CARE: Admit to inpatient  ? ?PATIENT DISPOSITION:  ICU - intubated and hemodynamically stable. ?  ?Delay start of Pharmacological VTE agent (>24hrs) due to surgical blood loss or risk of bleeding: no ? ?BASELINE WEIGHT: 105 kg ? ?

## 2022-01-08 NOTE — Progress Notes (Addendum)
Patient's daughter notified of patient waking up confused.  Patient unable to answer questions appropriately or follow commands well.  VSS.  CBG 148.  Rapid RN and MD notified. No new orders at this time. ? ?0500-pt now alert and oriented x4. ?

## 2022-01-08 NOTE — Op Note (Signed)
? ?   ?[address] ?      [place] [address] ?            [phone]      ?                                 ?  ? ?01/08/2022 ?Patient:  Raymond [name] ?Pre-Op Dx: Left main coronary artery disease ?  NSTEMI  ?history of peripheral vascular disease ?  Hypertension ?  Hyperlipidemia ?Post-op Dx: Same ?Procedure: ?CABG X 3.  LIMA to LAD, reverse saphenous vein graft to OM1, and first diagonal. ?Endoscopic greater saphenous vein harvest on the right and left ? ? ?Surgeon and Role:   ?   * Corliss Skains, MD - Primary ?   *D. Janeann Forehand- assisting ?An experienced assistant was required given the complexity of this surgery and the standard of surgical care. The assistant was needed for exposure, dissection, suctioning, retraction of delicate tissues and sutures, instrument exchange and for overall help during this procedure.   ? ?Anesthesia  general ?EBL: 500 ml ?Blood Administration: None ?Xclamp Time: 45 min ?Pump Time: 84 min ? ?Drains: 26 F blake drain: L, mediastinal  ?Wires: Ventricular ?Counts: correct ? ? ?Indications: ? This is a 71 year old gentleman this admitted following an NSTEMI along with congestive heart failure.  His echocardiogram also shows a possible LV thrombus.  The risks and benefits of surgical revascularization were discussed and he is agreeable to proceed.  He is scheduled for 01/08/2022. ?Findings: ?Good LAD target.  Moderate size diagonal target.  The obtuse marginal was intramyocardial.  Good vein, good LIMA. ? ?Operative Technique: ?All invasive lines were placed in pre-op holding.  After the risks, benefits and alternatives were thoroughly discussed, the patient was brought to the operative theatre.  Anesthesia was induced, and the patient was prepped and draped in normal sterile fashion.  An appropriate surgical pause was performed, and pre-operative antibiotics were dosed accordingly. ? ?We began with simultaneous incisions along the right leg for harvesting of  the greater saphenous vein and the chest for the sternotomy.  In regards to the sternotomy, this was carried down with bovie cautery, and the sternum was divided with a reciprocating saw.  Meticulous hemostasis was obtained.  The left internal thoracic artery was exposed and harvested in in pedicled fashion.  The patient was systemically heparinized, and the artery was divided distally, and placed in a papaverine sponge.   ? ?The sternal elevator was removed, and a retractor was placed.  The pericardium was divided in the midline and fashioned into a cradle with pericardial stitches.   After we confirmed an appropriate ACT, the ascending aorta was cannulated in standard fashion.  The right atrial appendage was used for venous cannulation site.  Cardiopulmonary bypass was initiated, and the heart retractor was placed. The cross clamp was applied, and a dose of anterograde cardioplegia was given with good arrest of the heart.  Next we exposed the lateral wall, and found a good target on the obtuse marginal.  An end to side anastomosis with the vein graft was then created.  Next, we exposed the anterior wall of the heart and identified a good target on diagonal.   An arteriotomy was created.  The vein was anastomosed in an end to side fashion.  Finally, we exposed a good target on the  LAD, and fashioned an end  to side anastomosis between it and the LITA.  We began to re-warm, and a re-animation dose of cardioplegia was given.  The heart was de-aired, and the cross clamp was removed.  Meticulous hemostasis was obtained.   ? ?A partial occludding clamp was then placed on the ascending aorta, and we created an end to side anastomosis between it and the proximal vein grafts.  Rings were placed on the proximal anastomosis.  Hemostasis was obtained, and we separated from cardiopulmonary bypass without event.  The heparin was reversed with protamine.  Chest tubes and wires were placed, and the sternum was re-approximated  with sternal wires.  The soft tissue and skin were re-approximated wth absorbable suture.   ? ?The patient tolerated the procedure without any immediate complications, and was transferred to the ICU in guarded condition. ? ?Corliss Skains ? ?

## 2022-01-08 NOTE — Transfer of Care (Signed)
Immediate Anesthesia Transfer of Care Note ? ?Patient: Raymond [name] ? ?Procedure(s) Performed: CORONARY ARTERY BYPASS GRAFTING (CABG) TIMES THREE USING LEFT INTERNAL MAMMARY ARTERY AND ENDOSCOPICALLY HARVESTED BILATERAL GREATER SAPHENOUS VEINS (Chest) ?TRANSESOPHAGEAL ECHOCARDIOGRAM (TEE) ?ENDOVEIN HARVEST OF GREATER SAPHENOUS VEIN (Right: Leg Lower) ? ?Patient Location: SICU ? ?Anesthesia Type:General ? ?Level of Consciousness: Patient remains intubated per anesthesia plan ? ?Airway & Oxygen Therapy: Patient remains intubated per anesthesia plan and Patient placed on Ventilator (see vital sign flow sheet for setting) ? ?Post-op Assessment: Report given to RN and Post -op Vital signs reviewed and stable ? ?Post vital signs: Reviewed and stable ? ?Last Vitals:  ?Vitals Value Taken Time  ?BP 99/52 01/08/22 1555  ?Temp 36.9 ?C 01/08/22 1607  ?Pulse 72 01/08/22 1607  ?Resp 12 01/08/22 1607  ?SpO2 95 % 01/08/22 1607  ?Vitals shown include unvalidated device data. ? ?Last Pain:  ?Vitals:  ? 01/08/22 0900  ?TempSrc:   ?PainSc: 6   ?   ? ?  ? ?Complications: No notable events documented. ?

## 2022-01-08 NOTE — Progress Notes (Signed)
Patient with 8/10 sudden pain below left scapula.  The pain is described as sharp, non radiating, and constant with shortness of breath.  On call cardiology notified.  MD suggested prn morphine and nitroglycerin.   ?

## 2022-01-08 NOTE — Anesthesia Procedure Notes (Signed)
Procedure Name: Intubation ?Date/Time: 01/08/2022 11:24 AM ?Performed by: Effie Berkshire, MD ?Pre-anesthesia Checklist: Patient identified, Emergency Drugs available, Suction available and Patient being monitored ?Patient Re-evaluated:Patient Re-evaluated prior to induction ?Oxygen Delivery Method: Circle system utilized ?Preoxygenation: Pre-oxygenation with 100% oxygen ?Induction Type: IV induction ?Ventilation: Oral airway inserted - appropriate to patient size and Two handed mask ventilation required ?Laryngoscope Size: Mac and 4 ?Grade View: Grade II ?Tube type: Oral ?Tube size: 8.0 mm ?Number of attempts: 1 ?Airway Equipment and Method: Stylet and Oral airway ?Placement Confirmation: ETT inserted through vocal cords under direct vision, positive ETCO2 and breath sounds checked- equal and bilateral ?Secured at: 25 cm ?Tube secured with: Tape ?Dental Injury: Teeth and Oropharynx as per pre-operative assessment  ? ? ? ? ?

## 2022-01-08 NOTE — Progress Notes (Signed)
CARDIAC REHAB PHASE I  ? ?Preop education completed with pt and family. Pt educated on importance of sternal precautions, IS use, and ambulation post-op. Family arranging help for after d/c. Pt denies further questions or concerns at this time. Will continue to follow pt throughout his hospital stay. ? ?[phone] ?Reynold Bowen, RN BSN ?01/08/2022 ?9:49 AM ? ?

## 2022-01-08 NOTE — Progress Notes (Signed)
PT Cancellation Note ? ?Patient Details ?Name: Raymond [name] ?MRN: [id number] ?DOB: April 10, 1951 ? ? ?Cancelled Treatment:    Reason Eval/Treat Not Completed: (P) Patient at procedure or test/unavailable (Pt at OR for CABG.) Will continue efforts next date per PT plan of care as schedule permits. Pt will need re-eval post-op. ? ? ?[name] ?01/08/2022, 10:26 AM ? ? ?

## 2022-01-08 NOTE — Consult Note (Signed)
NAME:  Raymond [name], MRN:  [id number], DOB:  [birth year], LOS: 3 ADMISSION DATE:  01/05/2022, CONSULTATION DATE:  01/08/22 REFERRING MD:  Cliffton AstersLightfoot, CHIEF COMPLAINT:  SOB   History of Present Illness:  71 year old man with hx of DM, COPD, OSA who presented with progressive SOB found to be in combined systolic/diastolic heart failure with an NSTEMI.  Subsequent workup showing LV thrombus and critical left main disease.  He was urgently evaluated by TCTS.  Today he went for CABG x 3: LIMA to LAD, SVG to DIAGONAL, [facility] Cliffton AstersLightfoot.  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 Suspicious for 14 x 5 mm thrombus (broad-based and fixed) at the level  of the inferior aspect of the apical left ventricular dyskinesia. Left  ventricular ejection fraction, by estimation, is 35 to 40%. The left  ventricle has mild to moderately  decreased function. The left ventricle demonstrates regional wall motion  abnormalities (see scoring diagram/findings for description). The left  ventricular internal cavity size was mildly dilated. There is moderate  concentric left ventricular  hypertrophy. Left ventricular diastolic parameters are consistent with  Grade II diastolic dysfunction (pseudonormalization). Elevated left atrial  pressure. There is moderate dyskinesis of the left ventricular, apical  segment. There is severe hypokinesis  of the left ventricular, mid-apical anterior wall and anteroseptal wall.   2. Right ventricular systolic function is normal. The right ventricular  size is normal.   3. Left atrial size was mild to moderately dilated.   4. The mitral valve is normal in structure. Mild mitral valve  regurgitation. No evidence of  mitral stenosis.   5. The aortic valve is tricuspid. Aortic valve regurgitation is not  visualized. No aortic stenosis is present.   6. The inferior vena cava is normal in size with greater than 50%  respiratory variability, suggesting right atrial pressure of 3 mmHg.  3/6 LCH  Conclusion   Ost LM to Mid LM lesion is 95% stenosed.   Mid LAD lesion is 100% stenosed.   2nd Diag lesion is 95% stenosed.  Interim History / Subjective:  Consulted.  Objective   Blood pressure (!) 117/50, pulse (!) 53, temperature 97.7 F (36.5 C), temperature source Oral, resp. rate 17, height 5\' 5"  (1.651 m), weight 105 kg, SpO2 95 %.        Intake/Output Summary (Last 24 hours) at 01/08/2022 1557 Last data filed at 01/08/2022 1522 Gross per 24 hour  Intake 4537.37 ml  Output 1900 ml  Net 2637.37 ml   Filed Weights   01/07/22 0346 01/08/22 0500 01/08/22 1023  Weight: 105 kg 105 kg 105 kg    Examination: General: elderly man in NAD HENT: ETT in place w/ minimal secretions Lungs: clear, passive on vent Cardiovascular: RRR, ext warm Abdomen: protuberant, soft, hypoactive bS Extremities: bilateral legs wrapped Neuro: Sedated/paralyzed GU: foley in place clear urine Skin: mediastinal drains  BMP stable POC H/H looks okay CXR pending Preop CT reviewed: not much evidence of COPD  Resolved Hospital Problem list   N/A  Assessment & Plan:  Postop vent management Critical left main and LAD disease POD 0 CABGx3 LIMA to LAD, SVG to DIAGONAL, SVG to Om1 Hx moderate OSA Questionable COPD diagnosis  Cerebrovascular disease DM2 well controlled  Drain management per TCTS Usual fast-track extubation pathway, vent mechanics looks okay Insulin gtt ABG/CXR pending Once extubated encourage IS, flutter, and CPAP qHS Will follow while in [place] (right click and "Reselect all SmartList Selections" daily)   Diet/type: NPO DVT prophylaxis: not indicated GI prophylaxis: N/A Lines: Central  line Foley:  Yes, and it is still needed Code Status:  full code Last date of multidisciplinary goals of care discussion [Pending]  Labs   CBC: Recent Labs  Lab 01/05/22 1627 01/07/22 0336 01/08/22 0615 01/08/22 1131 01/08/22 1402 01/08/22 1430 01/08/22 1445 01/08/22 1501 01/08/22 1504  WBC 5.7 6.5 6.2  --   --   --   --   --   --   HGB 14.3 14.0 13.7   < > 9.4* 8.8* 8.8* 8.8* 9.2*  HCT 41.1 39.3 38.6*   < > 27.0* 26.0* 26.0* 26.0* 27.0*  MCV 87.3 84.5 84.3  --   --   --   --   --   --   PLT 166 163 159  --  109*  --   --   --   --    < > = values in this interval not displayed.    Basic Metabolic Panel: Recent Labs  Lab 01/05/22 1627 01/06/22 0146 01/06/22 0505 01/07/22 0336 01/08/22 0615 01/08/22 1131 01/08/22 1135 01/08/22 1257 01/08/22 1331 01/08/22 1357 01/08/22 1430 01/08/22 1445 01/08/22 1501 01/08/22 1504  NA 140 140 141 138 137   < > 139 138   < > 139 139 139 140 139  K 3.7 3.2* 3.2* 3.5 3.7   < > 3.4* 3.7   < > 4.1 4.5 4.4 3.9 3.9  CL 107 105 106 104 103  --  100 101  --  102  --  101  --  103  CO2 25 27 26 25 26   --   --   --   --   --   --   --   --   --   GLUCOSE 146* 181* 115* 117* 125*  --  168* 145*  --  123*  --  126*  --  137*  BUN 13 12 12 10 16   --  14 16  --  13  --  13  --  12  CREATININE 1.24 1.28* 1.25* 1.34* 1.34*  --  1.30* 1.20  --  1.10  --  1.10  --  1.00  CALCIUM 8.7* 8.6* 8.6* 8.5* 8.6*  --   --   --   --   --   --   --   --   --    < > = values in this interval not displayed.   GFR: Estimated Creatinine Clearance: 74.5 mL/min (by C-G formula based on SCr of 1 mg/dL). Recent Labs  Lab 01/05/22 1627 01/07/22 0336 01/08/22 0615  WBC 5.7 6.5 6.2    Liver Function Tests: No results for input(s): AST, ALT, ALKPHOS, BILITOT, PROT, ALBUMIN in the last 168 hours. No results for input(s): LIPASE, AMYLASE in the last 168 hours. No results for input(s): AMMONIA in the last 168 hours.  ABG    Component Value Date/Time   PHART  7.391 01/08/2022 1501   PCO2ART 37.0 01/08/2022 1501   PO2ART 74 (L) 01/08/2022 1501   HCO3 22.5 01/08/2022 1501   TCO2 23 01/08/2022 1504   ACIDBASEDEF 2.0 01/08/2022 1501   O2SAT 95 01/08/2022 1501  Coagulation Profile: Recent Labs  Lab 01/08/22 0615  INR 1.1    Cardiac Enzymes: No results for input(s): CKTOTAL, CKMB, CKMBINDEX, TROPONINI in the last 168 hours.  HbA1C: Hgb A1c MFr Bld  Date/Time Value Ref Range Status  01/07/2022 07:40 PM 5.2 4.8 - 5.6 % Final    Comment:    (NOTE) Pre diabetes:          5.7%-6.4%  Diabetes:              >6.4%  Glycemic control for   <7.0% adults with diabetes   01/06/2022 01:46 AM 5.2 4.8 - 5.6 % Final    Comment:    (NOTE) Pre diabetes:          5.7%-6.4%  Diabetes:              >6.4%  Glycemic control for   <7.0% adults with diabetes     CBG: Recent Labs  Lab 01/07/22 0607 01/07/22 1103 01/07/22 1636 01/08/22 0142 01/08/22 1000  GLUCAP 137* 153* 209* 148* 138*    Review of Systems:   Intubated/sedated/paralyzed  Past Medical History:  He,  has a past medical history of Arthritis, Depression, Diabetes mellitus type 2 in obese ([place]), Hypertension, Migraines, Sleep apnea, and Stroke ([place]).   Surgical History:   Past Surgical History:  Procedure Laterality Date   CHOLECYSTECTOMY     CORONARY ANGIOGRAPHY N/A 01/07/2022   Procedure: CORONARY ANGIOGRAPHY;  Surgeon: Lorretta Harp, MD;  Location: [facility];  Service: Cardiovascular;  Laterality: N/A;   KNEE ARTHROSCOPY     LOOP RECORDER INSERTION N/A 06/24/2017   Procedure: LOOP RECORDER INSERTION;  Surgeon: Thompson Grayer, MD;  Location: [facility];  Service: Cardiovascular;  Laterality: N/A;   ROTATOR CUFF REPAIR     TEE WITHOUT CARDIOVERSION N/A 06/24/2017   Procedure: TRANSESOPHAGEAL ECHOCARDIOGRAM (TEE) WITH LOOP;  Surgeon: Acie Fredrickson Wonda Cheng, MD;  Location: [facility];  Service: Cardiovascular;  Laterality: N/A;     Social History:    reports that he has been smoking cigars. He has never used smokeless tobacco. He reports that he does not drink alcohol and does not use drugs.   Family History:  His family history includes Other in his father and mother.   Allergies Allergies  Allergen Reactions   Flomax [Tamsulosin Hcl] Other (See Comments)    HYPOtension and syncope     Home Medications  Prior to Admission medications   Medication Sig Start Date End Date Taking? Authorizing Provider  acetaminophen (TYLENOL) 500 MG tablet Take 2 tablets (1,000 mg total) by mouth every 6 (six) hours as needed. Patient taking differently: Take 1,000 mg by mouth every 6 (six) hours as needed for mild pain. 04/23/16  Yes Charlesetta Shanks, MD  aspirin 325 MG tablet Take 325 mg by mouth daily.   Yes [provider]  atorvastatin (LIPITOR) 20 MG tablet Take 1 tablet (20 mg total) by mouth daily at 6 PM. 06/24/17  Yes Sheikh, Omair Latif, DO  Dextromethorphan-guaiFENesin (MUCUS RELIEF DM MAX PO) Take 20 mLs by mouth every 4 (four) hours as needed (cough).   Yes [provider]  doxazosin (CARDURA) 8 MG tablet Take 12 mg by mouth at bedtime.   Yes [provider]  doxycycline (VIBRAMYCIN) 100 MG capsule Take 100 mg by mouth See admin instructions. Bid x 7 days   Yes [provider]  ibuprofen (ADVIL) 200 MG tablet Take 400 mg by mouth every 6 (six) hours  as needed for headache or moderate pain.   Yes [provider]  levocetirizine (XYZAL) 5 MG tablet Take 5 mg by mouth daily as needed for allergies. 06/02/17  Yes [provider]  losartan (COZAAR) 100 MG tablet Take 100 mg by mouth daily. 05/28/19  Yes [provider]  metFORMIN (GLUCOPHAGE) 500 MG tablet Take 1 tablet (500 mg total) by mouth at bedtime. Patient taking differently: Take 500 mg by mouth 2 (two) times daily with a meal. 07/13/17  Yes Mikhail, Velta Addison, DO  sertraline (ZOLOFT) 100 MG tablet Take 100 mg by mouth daily.  07/17/17   Yes [provider]     Critical care time: N/A

## 2022-01-08 NOTE — Anesthesia Postprocedure Evaluation (Signed)
Anesthesia Post Note ? ?Patient: Raymond [name] ? ?Procedure(s) Performed: CORONARY ARTERY BYPASS GRAFTING (CABG) TIMES THREE USING LEFT INTERNAL MAMMARY ARTERY AND ENDOSCOPICALLY HARVESTED BILATERAL GREATER SAPHENOUS VEINS (Chest) ?TRANSESOPHAGEAL ECHOCARDIOGRAM (TEE) ?ENDOVEIN HARVEST OF GREATER SAPHENOUS VEIN (Right: Leg Lower) ? ?  ? ?Patient location during evaluation: SICU ?Anesthesia Type: General ?Level of consciousness: sedated ?Pain management: pain level controlled ?Vital Signs Assessment: post-procedure vital signs reviewed and stable ?Respiratory status: patient remains intubated per anesthesia plan ?Cardiovascular status: stable ?Postop Assessment: no apparent nausea or vomiting ?Anesthetic complications: no ? ? ?No notable events documented. ? ?Last Vitals:  ?Vitals:  ? 01/08/22 0515 01/08/22 1555  ?BP:  (!) 99/52  ?Pulse: (!) 53 62  ?Resp: 17 12  ?Temp:    ?SpO2: 95% 95%  ?  ?Last Pain:  ?Vitals:  ? 01/08/22 0900  ?TempSrc:   ?PainSc: 6   ? ? ?  ?  ?  ?  ?  ?  ? ?Shelton Silvas ? ? ? ? ?

## 2022-01-08 NOTE — Progress Notes (Signed)
?   ?  [address] ?      [place] [address] ?            [phone]      ? ?No events ?Vitals:  ? 01/08/22 0500 01/08/22 0515  ?BP: (!) 117/50   ?Pulse: 60 (!) 53  ?Resp: 16 17  ?Temp: 97.7 ?F (36.5 ?C)   ?SpO2: 95% 95%  ? ?Alert NAD ?Sinus ?EWOB ? ?OR today for CABG ? ?Corliss Skains ? ?

## 2022-01-09 ENCOUNTER — Encounter (HOSPITAL_COMMUNITY): Payer: Self-pay | Admitting: Thoracic Surgery (Cardiothoracic Vascular Surgery)

## 2022-01-09 ENCOUNTER — Inpatient Hospital Stay (HOSPITAL_COMMUNITY): Payer: Medicare Other

## 2022-01-09 DIAGNOSIS — I5041 Acute combined systolic (congestive) and diastolic (congestive) heart failure: Secondary | ICD-10-CM | POA: Diagnosis not present

## 2022-01-09 LAB — CBC
HCT: 34.5 % — ABNORMAL LOW (ref 39.0–52.0)
HCT: 33.2 % — ABNORMAL LOW (ref 39.0–52.0)
Hemoglobin: 11.8 g/dL — ABNORMAL LOW (ref 13.0–17.0)
Hemoglobin: 11.5 g/dL — ABNORMAL LOW (ref 13.0–17.0)
MCH: 29.8 pg (ref 26.0–34.0)
MCH: 30.6 pg (ref 26.0–34.0)
MCHC: 34.2 g/dL (ref 30.0–36.0)
MCHC: 34.6 g/dL (ref 30.0–36.0)
MCV: 87.1 fL (ref 80.0–100.0)
MCV: 88.3 fL (ref 80.0–100.0)
Platelets: 120 10*3/uL — ABNORMAL LOW (ref 150–400)
Platelets: 125 10*3/uL — ABNORMAL LOW (ref 150–400)
RBC: 3.96 MIL/uL — ABNORMAL LOW (ref 4.22–5.81)
RBC: 3.76 MIL/uL — ABNORMAL LOW (ref 4.22–5.81)
RDW: 13.5 % (ref 11.5–15.5)
RDW: 13.7 % (ref 11.5–15.5)
WBC: 8.8 10*3/uL (ref 4.0–10.5)
WBC: 8.8 10*3/uL (ref 4.0–10.5)
nRBC: 0 % (ref 0.0–0.2)
nRBC: 0 % (ref 0.0–0.2)

## 2022-01-09 LAB — POCT I-STAT 7, (LYTES, BLD GAS, ICA,H+H)
Acid-base deficit: 2 mmol/L (ref 0.0–2.0)
Acid-base deficit: 11 mmol/L — ABNORMAL HIGH (ref 0.0–2.0)
Acid-base deficit: 3 mmol/L — ABNORMAL HIGH (ref 0.0–2.0)
Acid-base deficit: 3 mmol/L — ABNORMAL HIGH (ref 0.0–2.0)
Acid-base deficit: 4 mmol/L — ABNORMAL HIGH (ref 0.0–2.0)
Bicarbonate: 25.9 mmol/L (ref 20.0–28.0)
Bicarbonate: 14.8 mmol/L — ABNORMAL LOW (ref 20.0–28.0)
Bicarbonate: 22.9 mmol/L (ref 20.0–28.0)
Bicarbonate: 22.8 mmol/L (ref 20.0–28.0)
Bicarbonate: 20.9 mmol/L (ref 20.0–28.0)
Calcium, Ion: 1.25 mmol/L (ref 1.15–1.40)
Calcium, Ion: 0.92 mmol/L — ABNORMAL LOW (ref 1.15–1.40)
Calcium, Ion: 1.22 mmol/L (ref 1.15–1.40)
Calcium, Ion: 1.2 mmol/L (ref 1.15–1.40)
Calcium, Ion: 1.23 mmol/L (ref 1.15–1.40)
HCT: 32 % — ABNORMAL LOW (ref 39.0–52.0)
HCT: 25 % — ABNORMAL LOW (ref 39.0–52.0)
HCT: 33 % — ABNORMAL LOW (ref 39.0–52.0)
HCT: 33 % — ABNORMAL LOW (ref 39.0–52.0)
HCT: 30 % — ABNORMAL LOW (ref 39.0–52.0)
Hemoglobin: 10.9 g/dL — ABNORMAL LOW (ref 13.0–17.0)
Hemoglobin: 11.2 g/dL — ABNORMAL LOW (ref 13.0–17.0)
Hemoglobin: 8.5 g/dL — ABNORMAL LOW (ref 13.0–17.0)
Hemoglobin: 11.2 g/dL — ABNORMAL LOW (ref 13.0–17.0)
Hemoglobin: 10.2 g/dL — ABNORMAL LOW (ref 13.0–17.0)
O2 Saturation: 93 %
O2 Saturation: 92 %
O2 Saturation: 93 %
O2 Saturation: 91 %
O2 Saturation: 93 %
Patient temperature: 36.9
Patient temperature: 37.3
Patient temperature: 37.3
Patient temperature: 37.6
Patient temperature: 37.5
Potassium: 4.1 mmol/L (ref 3.5–5.1)
Potassium: 2.9 mmol/L — ABNORMAL LOW (ref 3.5–5.1)
Potassium: 4.4 mmol/L (ref 3.5–5.1)
Potassium: 4.3 mmol/L (ref 3.5–5.1)
Potassium: 3.8 mmol/L (ref 3.5–5.1)
Sodium: 139 mmol/L (ref 135–145)
Sodium: 137 mmol/L (ref 135–145)
Sodium: 146 mmol/L — ABNORMAL HIGH (ref 135–145)
Sodium: 138 mmol/L (ref 135–145)
Sodium: 137 mmol/L (ref 135–145)
TCO2: 28 mmol/L (ref 22–32)
TCO2: 16 mmol/L — ABNORMAL LOW (ref 22–32)
TCO2: 24 mmol/L (ref 22–32)
TCO2: 24 mmol/L (ref 22–32)
TCO2: 22 mmol/L (ref 22–32)
pCO2 arterial: 58.5 mmHg — ABNORMAL HIGH (ref 32–48)
pCO2 arterial: 30.3 mmHg — ABNORMAL LOW (ref 32–48)
pCO2 arterial: 46 mmHg (ref 32–48)
pCO2 arterial: 45.9 mmHg (ref 32–48)
pCO2 arterial: 38 mmHg (ref 32–48)
pH, Arterial: 7.254 — ABNORMAL LOW (ref 7.35–7.45)
pH, Arterial: 7.298 — ABNORMAL LOW (ref 7.35–7.45)
pH, Arterial: 7.307 — ABNORMAL LOW (ref 7.35–7.45)
pH, Arterial: 7.307 — ABNORMAL LOW (ref 7.35–7.45)
pH, Arterial: 7.35 (ref 7.35–7.45)
pO2, Arterial: 80 mmHg — ABNORMAL LOW (ref 83–108)
pO2, Arterial: 71 mmHg — ABNORMAL LOW (ref 83–108)
pO2, Arterial: 74 mmHg — ABNORMAL LOW (ref 83–108)
pO2, Arterial: 68 mmHg — ABNORMAL LOW (ref 83–108)
pO2, Arterial: 70 mmHg — ABNORMAL LOW (ref 83–108)

## 2022-01-09 LAB — GLUCOSE, CAPILLARY
Glucose-Capillary: 108 mg/dL — ABNORMAL HIGH (ref 70–99)
Glucose-Capillary: 113 mg/dL — ABNORMAL HIGH (ref 70–99)
Glucose-Capillary: 115 mg/dL — ABNORMAL HIGH (ref 70–99)
Glucose-Capillary: 158 mg/dL — ABNORMAL HIGH (ref 70–99)
Glucose-Capillary: 158 mg/dL — ABNORMAL HIGH (ref 70–99)
Glucose-Capillary: 165 mg/dL — ABNORMAL HIGH (ref 70–99)
Glucose-Capillary: 173 mg/dL — ABNORMAL HIGH (ref 70–99)
Glucose-Capillary: 176 mg/dL — ABNORMAL HIGH (ref 70–99)
Glucose-Capillary: 186 mg/dL — ABNORMAL HIGH (ref 70–99)

## 2022-01-09 LAB — BASIC METABOLIC PANEL
Anion gap: 6 (ref 5–15)
Anion gap: 7 (ref 5–15)
BUN: 15 mg/dL (ref 8–23)
BUN: 19 mg/dL (ref 8–23)
CO2: 23 mmol/L (ref 22–32)
CO2: 25 mmol/L (ref 22–32)
Calcium: 7.8 mg/dL — ABNORMAL LOW (ref 8.9–10.3)
Calcium: 7.9 mg/dL — ABNORMAL LOW (ref 8.9–10.3)
Chloride: 106 mmol/L (ref 98–111)
Chloride: 103 mmol/L (ref 98–111)
Creatinine, Ser: 1.4 mg/dL — ABNORMAL HIGH (ref 0.61–1.24)
Creatinine, Ser: 1.63 mg/dL — ABNORMAL HIGH (ref 0.61–1.24)
GFR, Estimated: 53 mL/min — ABNORMAL LOW (ref >60)
GFR, Estimated: 44 mL/min — ABNORMAL LOW (ref >60)
Glucose, Bld: 160 mg/dL — ABNORMAL HIGH (ref 70–99)
Glucose, Bld: 192 mg/dL — ABNORMAL HIGH (ref 70–99)
Potassium: 4.2 mmol/L (ref 3.5–5.1)
Potassium: 3.9 mmol/L (ref 3.5–5.1)
Sodium: 135 mmol/L (ref 135–145)
Sodium: 135 mmol/L (ref 135–145)

## 2022-01-09 LAB — MAGNESIUM
Magnesium: 2.9 mg/dL — ABNORMAL HIGH (ref 1.7–2.4)
Magnesium: 2.6 mg/dL — ABNORMAL HIGH (ref 1.7–2.4)

## 2022-01-09 MED ORDER — ENOXAPARIN SODIUM 30 MG/0.3ML IJ SOSY
30.0000 mg | PREFILLED_SYRINGE | Freq: Every day | INTRAMUSCULAR | Status: DC
  Administered 2022-01-09: 30 mg via SUBCUTANEOUS
  Filled 2022-01-09: qty 0.3

## 2022-01-09 MED ORDER — SODIUM CHLORIDE 0.9% FLUSH: 10.0000 mL | INTRAVENOUS | Status: DC | PRN

## 2022-01-09 MED ORDER — CARVEDILOL 3.125 MG PO TABS
3.1250 mg | ORAL_TABLET | Freq: Two times a day (BID) | ORAL | Status: DC
  Administered 2022-01-09 – 2022-01-11 (×4): 3.125 mg via ORAL
  Filled 2022-01-09 (×5): qty 1

## 2022-01-09 MED ORDER — SODIUM CHLORIDE 0.9% FLUSH
10.0000 mL | Freq: Two times a day (BID) | INTRAVENOUS | Status: DC
  Administered 2022-01-09 (×2): 10 mL
  Administered 2022-01-10: 10:00:00 20 mL
  Administered 2022-01-10 – 2022-01-15 (×10): 10 mL

## 2022-01-09 MED ORDER — INSULIN ASPART 100 UNIT/ML IJ SOLN: 0.0000 [IU] | INTRAMUSCULAR | Status: DC

## 2022-01-09 MED ORDER — ORAL CARE MOUTH RINSE
15.0000 mL | Freq: Two times a day (BID) | OROMUCOSAL | Status: DC
  Administered 2022-01-09 – 2022-01-15 (×12): 15 mL via OROMUCOSAL

## 2022-01-09 MED ORDER — INSULIN ASPART 100 UNIT/ML IJ SOLN
0.0000 [IU] | INTRAMUSCULAR | Status: DC
  Administered 2022-01-09 – 2022-01-10 (×7): 4 [IU] via SUBCUTANEOUS
  Administered 2022-01-10: 16:00:00 2 [IU] via SUBCUTANEOUS
  Administered 2022-01-10: 20:00:00 4 [IU] via SUBCUTANEOUS
  Administered 2022-01-11 (×3): 2 [IU] via SUBCUTANEOUS

## 2022-01-09 MED FILL — Heparin Sodium (Porcine) Inj 1000 Unit/ML: Qty: 1000 | Status: AC

## 2022-01-09 MED FILL — Lidocaine HCl Local Preservative Free (PF) Inj 2%: INTRAMUSCULAR | Qty: 15 | Status: AC

## 2022-01-09 MED FILL — Potassium Chloride Inj 2 mEq/ML: INTRAVENOUS | Qty: 40 | Status: AC

## 2022-01-09 NOTE — Progress Notes (Signed)
? ?   ?  [address] ?      [place] [address] ?            [phone]   ? ?  ?POD # 1 CABG x 3 ? ?Sleeping currently ? ?BP 111/60 (BP Location: Left Arm)   Pulse 61   Temp 98.5 ?F (36.9 ?C) (Oral)   Resp 20   Ht 5\' 5"  (1.651 m)   Wt 111.4 kg   SpO2 94%   BMI 40.87 kg/m?  ?1 L [place] 96% sat ? ?Intake/Output Summary (Last 24 hours) at 01/09/2022 1733 ?Last data filed at 01/09/2022 1600 ?Gross per 24 hour  ?Intake 1988.22 ml  ?Output 1676 ml  ?Net 312.22 ml  ? ?Creatinine= 1.63 (baseline 1.24) c/w acute kidney injury ?K= 3.9 ?Hct= 33 ? ?Monitor UO, no nephrotoxic meds ? ?03/11/2022 Salvatore Decent, MD ?[facility] ?[phone] ? ?

## 2022-01-09 NOTE — Progress Notes (Signed)
Progress Note  Patient Name: Raymond [name] Date of Encounter: 01/09/2022  [facility] Cardiologist: None   Subjective   Feeling rough.  Very tired after walking.    Inpatient Medications    Scheduled Meds:  acetaminophen  1,000 mg Oral Q6H   Or   acetaminophen (TYLENOL) oral liquid 160 mg/5 mL  1,000 mg Per Tube Q6H   aspirin EC  325 mg Oral Daily   Or   aspirin  324 mg Per Tube Daily   atorvastatin  40 mg Oral q1800   bisacodyl  10 mg Oral Daily   Or   bisacodyl  10 mg Rectal Daily   carvedilol  3.125 mg Oral BID WC   Chlorhexidine Gluconate Cloth  6 each Topical Daily   docusate sodium  200 mg Oral Daily   enoxaparin (LOVENOX) injection  30 mg Subcutaneous QHS   insulin aspart  0-24 Units Subcutaneous Q4H   mouth rinse  15 mL Mouth Rinse BID   [START ON 01/10/2022] pantoprazole  40 mg Oral Daily   sertraline  100 mg Oral Daily   sodium chloride flush  10-40 mL Intracatheter Q12H   sodium chloride flush  3 mL Intravenous Q12H   Continuous Infusions:  sodium chloride Stopped (01/08/22 1630)   sodium chloride     sodium chloride     albumin human Stopped (01/09/22 0826)    ceFAZolin (ANCEF) IV Stopped (01/09/22 0618)   dexmedetomidine (PRECEDEX) IV infusion Stopped (01/08/22 2203)   DOBUTamine Stopped (01/08/22 1833)   insulin Stopped (01/09/22 1003)   lactated ringers     lactated ringers Stopped (01/08/22 2215)   lactated ringers 20 mL/hr at 01/09/22 0800   niCARDipine     nitroGLYCERIN Stopped (01/08/22 1703)   norepinephrine (LEVOPHED) Adult infusion     PRN Meds: sodium chloride, albumin human, albuterol, dextrose, lactated ringers, metoprolol tartrate, morphine injection, ondansetron (ZOFRAN) IV, oxyCODONE, sodium chloride flush, sodium chloride flush, traMADol   Vital Signs    Vitals:   01/09/22 0800 01/09/22 0828 01/09/22 0900 01/09/22 1000  BP: 109/64  (!) 112/54 116/60  Pulse: (!) 58 60 60 63  Resp: 20 20 (!) 21 17  Temp: 99.7 F (37.6 C) 99.7  F (37.6 C) 99.5 F (37.5 C) 99.5 F (37.5 C)  TempSrc: Bladder     SpO2: 97% 95% 95% 95%  Weight:      Height:        Intake/Output Summary (Last 24 hours) at 01/09/2022 1016 Last data filed at 01/09/2022 0900 Gross per 24 hour  Intake 4667.26 ml  Output 2733 ml  Net 1934.26 ml   Last 3 Weights 01/09/2022 01/08/2022 01/08/2022  Weight (lbs) 245 lb 9.5 oz 231 lb 7.7 oz 231 lb 7.7 oz  Weight (kg) 111.4 kg 105 kg 105 kg  Some encounter information is confidential and restricted. Go to Review Flowsheets activity to see all data.      Telemetry    Sinus rhythm.  PVCs - Personally Reviewed  ECG    Sinus bradycardia.  Rate 54 bpm.  Lateral TWI. - Personally Reviewed  Physical Exam   VS:  BP 116/60    Pulse 63    Temp 99.5 F (37.5 C)    Resp 17    Ht 5\' 5"  (1.651 m)    Wt 111.4 kg    SpO2 95%    BMI 40.87 kg/m  , BMI Body mass index is 40.87 kg/m. GENERAL:  No acute distress HEENT:  Pupils equal round and reactive, fundi not visualized, oral mucosa unremarkable NECK:  R IJ triple lumen catheter.  No jugular venous distention, waveform within normal limits, carotid upstroke brisk and symmetric, no bruits, no thyromegaly LUNGS:  Clear to auscultation bilaterally CHEST: Midline sternotomy dressing C/D/I HEART:  RRR.  PMI not displaced or sustained,S1 and S2 within normal limits, no S3, no S4, no clicks, no rubs, no murmurs ABD:  Flat, positive bowel sounds normal in frequency in pitch, no bruits, no rebound, no guarding, no midline pulsatile mass, no hepatomegaly, no splenomegaly EXT:  2 plus pulses throughout, R UE edema, no cyanosis no clubbing SKIN:  No rashes no nodules NEURO:  Cranial nerves II through XII grossly intact, motor grossly intact throughout [facility]:  Cognitively intact, oriented to person place and time   Labs    High Sensitivity Troponin:   Recent Labs  Lab 01/05/22 1627 01/05/22 1758 01/06/22 0146 01/06/22 0437  TROPONINIHS 34* 38* 95* 97*      Chemistry Recent Labs  Lab 01/08/22 0615 01/08/22 1131 01/08/22 1504 01/08/22 1618 01/08/22 2157 01/09/22 0154 01/09/22 0320 01/09/22 0429 01/09/22 0930  NA 137   < > 139   < > 136   < > 135 138 137  K 3.7   < > 3.9   < > 5.2*   < > 4.2 4.3 3.8  CL 103   < > 103  --  107  --  106  --   --   CO2 26  --   --   --  22  --  23  --   --   GLUCOSE 125*   < > 137*  --  192*  --  160*  --   --   BUN 16   < > 12  --  13  --  15  --   --   CREATININE 1.34*   < > 1.00  --  1.23  --  1.40*  --   --   CALCIUM 8.6*  --   --   --  7.6*  --  7.8*  --   --   MG  --   --   --   --  2.9*  --  2.9*  --   --   GFRNONAA 56*  --   --   --  >60  --  53*  --   --   ANIONGAP 8  --   --   --  7  --  6  --   --    < > = values in this interval not displayed.    Lipids  Recent Labs  Lab 01/06/22 0146  CHOL 124  TRIG 111  HDL 35*  LDLCALC 67  CHOLHDL 3.5    Hematology Recent Labs  Lab 01/08/22 1618 01/08/22 2157 01/09/22 0154 01/09/22 0320 01/09/22 0429 01/09/22 0930  WBC 3.9* 9.9  --  8.8  --   --   RBC 2.27* 3.77*  --  3.96*  --   --   HGB 10.9*   6.9* 11.6*   < > 11.8* 11.2* 10.2*  HCT 32.0*   20.5* 33.3*   < > 34.5* 33.0* 30.0*  MCV 90.3 88.3  --  87.1  --   --   MCH 30.4 30.8  --  29.8  --   --   MCHC 33.7 34.8  --  34.2  --   --   RDW 13.5 13.7  --  13.5  --   --   PLT 62* 108*  --  120*  --   --    < > = values in this interval not displayed.   Thyroid No results for input(s): TSH, FREET4 in the last 168 hours.  BNP Recent Labs  Lab 01/05/22 1748  BNP 1,096.9*    DDimer No results for input(s): DDIMER in the last 168 hours.   Radiology    CARDIAC CATHETERIZATION  Result Date: 01/07/2022 Images from the original result were not included.   Ost LM to Mid LM lesion is 95% stenosed.   Mid LAD lesion is 100% stenosed.   2nd Diag lesion is 95% stenosed. Raymond [name] is a 71 y.o. male  [id number] LOCATION:  FACILITY: [facility] PHYSICIAN: Quay Burow, M.D. 25-Nov-1950 DATE OF  PROCEDURE:  01/07/2022 DATE OF DISCHARGE: CARDIAC CATHETERIZATION History obtained from chart review.71 y.o. male with diabetes, obesity, hypertension, stroke, PAF, hyperlipidemia, and OSA on CPAP admitted with acute systolic and diastolic heart failure.  By history, it appears he had an out of hospital anterior MI proxy a week ago.  His EF is in the 25 to 30% range.  He was diuresed and medically optimized.  He is now able to lie flat.  He said no further chest pain.  He does have segmental wall motion abnormalities consistent with myocardial infarction in the LAD territory.  He presents now for diagnostic coronary angiography.  Of note, he did have an apical mural thrombus by [facility] echocardiography.   Mr. 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  It is possible that he is still has viability in the LAD territory given his significant collaterals.  He will need coronary artery bypass grafting.  I will restart heparin 4 hours after sheath removal.  The radial sheath was removed and a TR band was placed on the right wrist to achieve patent hemostasis.  The patient left lab in stable condition.  T CTS has been notified. Quay Burow. MD, [facility] 01/07/2022 2:03 PM    DG Chest Port 1 View  Result Date: 01/09/2022 CLINICAL DATA:  Recent CABG. EXAM: PORTABLE CHEST 1 VIEW COMPARISON:  PA Lat 01/05/2022. FINDINGS: Interval CABG with intact median sternotomy sutures. Stable cardiomegaly and mild central vascular prominence. There is no overt evidence of pulmonary edema. There are bilateral perihilar atelectatic bands, low lung volumes and increased opacity in the base of the lungs which could be atelectasis or consolidation. The mid and upper lungs are clear except for the parahilar linear atelectasis. A left chest loop recorder device is again noted. ETT tip is 4.5 cm from the carina, NGT  enters the stomach with the intragastric course not fully included. There is a least 1 mediastinal surgical drain. There is a right IJ central line through an introducer sheath with tip at the brachiocephalic/SVC junction. No pneumothorax is seen. There are small pleural effusions. IMPRESSION: 1. CABG changes with mild central vascular distention without edema. 2. Small pleural effusions with opacity in the lung bases consistent with atelectasis or consolidation. 3. Support devices as above, with at least 1 mediastinal surgical drain in place. Electronically Signed   By: Telford Nab M.D.   On: 01/09/2022 07:01   DG Chest Port 1 View  Result Date: 01/08/2022 CLINICAL DATA:  Follow-up CABG EXAM: PORTABLE CHEST 1 VIEW COMPARISON:  01/05/2022 FINDINGS: Interval median sternotomy and CABG.  Endotracheal tube tip 4 cm above the carina. Orogastric or nasogastric tube has its tip in the body of the stomach. Lead artifacts overlie the chest. Right internal jugular venous access sheath with central line tip in the SVC at the as ago is level. Question Swan-Ganz catheter introduced from a femoral approach, looping in the right atrium with its tip possibly in the right ventricle. This could possibly be some other sort of catheter or wire. There is mild pulmonary edema and atelectasis. No pneumothorax. IMPRESSION: Status post CABG. No visible pneumothorax. See above for details of line and tube positions. Mild edema and atelectasis. Electronically Signed   By: Nelson Chimes M.D.   On: 01/08/2022 16:45   ECHO INTRAOPERATIVE TEE  Result Date: 01/08/2022  *INTRAOPERATIVE TRANSESOPHAGEAL REPORT *  Patient Name:   Raymond [name] Date of Exam: 01/08/2022 Medical Rec #:  [id number]     Height:       65.0 in Accession #:    [id number]    Weight:       231.5 lb Date of Birth:  Oct 02, 1951    BSA:          2.10 m Patient Age:    69 years      BP:           117/50 mmHg Patient Gender: M             HR:           54 bpm. Exam Location:   Inpatient Transesophogeal exam was perform intraoperatively during surgical procedure. Patient was closely monitored under general anesthesia during the entirety of examination. Indications:     I25.110 Atherosclerotic heart disease of native coronary artery                  with unstable angina pectoris Performing Phys: Suella Broad MD Diagnosing Phys: Suella Broad MD Complications: No known complications during this procedure. POST-OP IMPRESSIONS     s/p CABG x3 _ Left Ventricle: LVEF slightly improved from pre-bypass function, EF > 45%, RWMA's slightly improved. CO normal. _ Right Ventricle: The right ventricle appears unchanged from pre-bypass. _ Aorta: Aorta unchanged, no dissection noted after cannula removed. _ Left Atrium: The left atrium appears unchanged from pre-bypass. _ Left Atrial Appendage: The left atrial appendage appears unchanged from pre-bypass. _ Aortic Valve: The aortic valve appears unchanged from pre-bypass. _ Mitral Valve: The mitral valve appears unchanged from pre-bypass. _ Tricuspid Valve: The tricuspid valve appears unchanged from pre-bypass. _ Pulmonic Valve: The pulmonic valve appears unchanged from pre-bypass. _ Interatrial Septum: The interatrial septum appears unchanged from pre-bypass. _ Interventricular Septum: The interventricular septum appears unchanged from pre-bypass. _ Pericardium: No pericardial effusion present, chest tubes in place. PRE-OP FINDINGS  Left Ventricle: The left ventricle has moderately reduced systolic function, with an ejection fraction of 35-40%. The cavity size was mildly dilated. Left ventricular diffuse hypokinesis. No intracardiac thrombi or masses were visualized. There is mild concentric left ventricular hypertrophy.  LV Wall Scoring: The moderately depressed function of the anteroseptal, inferior and inferoseptal wall. Antero-lateral wall, basal inferolateral segment are mildly hypokinetic.  Right Ventricle: The right ventricle has normal systolic  function. The cavity was normal. There is no increase in right ventricular wall thickness. Left Atrium: Left atrial size was dilated. No left atrial/left atrial appendage thrombus was detected. The left atrial appendage is well visualized and there is no evidence of thrombus present. Left atrial appendage velocity is normal at greater than 40 cm/s.  Right Atrium: Right atrial size was normal in size. Interatrial Septum: No atrial level shunt detected by color flow Doppler. There is no evidence of a patent foramen ovale. Pericardium: Trivial pericardial effusion is present. The pericardial effusion is circumferential. There is no pleural effusion. Mitral Valve: The mitral valve is normal in structure. Mitral valve regurgitation is not visualized by color flow Doppler. There is no evidence of mitral valve vegetation. There is No evidence of mitral stenosis. Tricuspid Valve: The tricuspid valve was normal in structure. Tricuspid valve regurgitation was not visualized by color flow Doppler. No evidence of tricuspid stenosis is present. There is no evidence of tricuspid valve vegetation. Aortic Valve: The aortic valve is tricuspid Aortic valve regurgitation was not visualized by color flow Doppler. There is no stenosis of the aortic valve. There is no evidence of aortic valve vegetation. Pulmonic Valve: The pulmonic valve was normal in structure. Pulmonic valve regurgitation is not visualized by color flow Doppler. Aorta: The aortic root, ascending aorta and aortic arch are normal in size and structure. There is evidence of plaque in the descending aorta; Grade I, measuring 1-14mm in size. Pulmonary Artery: The pulmonary artery is of normal size. Venous: The inferior vena cava was not well visualized. Shunts: There is no evidence of an atrial septal defect.  Suella Broad MD Electronically signed by Suella Broad MD Signature Date/Time: 01/08/2022/4:59:04 PM    Final    VAS US DOPPLER PRE CABG  Result Date:  01/08/2022 PREOPERATIVE VASCULAR EVALUATION Patient Name:  Raymond [name]  Date of Exam:   01/08/2022 Medical Rec #: [id number]      Accession #:    [id number] Date of Birth: Jun 25, 1951     Patient Gender: M Patient Age:   81 years Exam Location:  [facility] Procedure:      VAS US DOPPLER PRE CABG Referring Phys: HARRELL LIGHTFOOT --------------------------------------------------------------------------------  Indications:      Pre-CABG. Risk Factors:     Hypertension, Diabetes, prior CVA. Comparison Study: no prior Performing Technologist: Archie Patten RVS  Examination Guidelines: A complete evaluation includes B-mode imaging, spectral Doppler, color Doppler, and power Doppler as needed of all accessible portions of each vessel. Bilateral testing is considered an integral part of a complete examination. Limited examinations for reoccurring indications may be performed as noted.  Right Carotid Findings: +----------+--------+--------+--------+------------+--------+             PSV cm/s EDV cm/s Stenosis Describe     Comments  +----------+--------+--------+--------+------------+--------+  CCA Prox   90       14                heterogenous           +----------+--------+--------+--------+------------+--------+  CCA Distal 76       14                heterogenous           +----------+--------+--------+--------+------------+--------+  ICA Prox   65       16       1-39%    heterogenous           +----------+--------+--------+--------+------------+--------+  ICA Distal 89       26                                       +----------+--------+--------+--------+------------+--------+  ECA        148  10                                       +----------+--------+--------+--------+------------+--------+ +----------+--------+-------+--------+------------+             PSV cm/s EDV cms Describe Arm Pressure  +----------+--------+-------+--------+------------+  Subclavian 86                                       +----------+--------+-------+--------+------------+ +---------+--------+--+--------+-+---------+  Vertebral PSV cm/s 42 EDV cm/s 8 Antegrade  +---------+--------+--+--------+-+---------+ Left Carotid Findings: +----------+--------+--------+--------+------------+--------+             PSV cm/s EDV cm/s Stenosis Describe     Comments  +----------+--------+--------+--------+------------+--------+  CCA Prox   89       9                 heterogenous           +----------+--------+--------+--------+------------+--------+  CCA Distal 79       7                 heterogenous           +----------+--------+--------+--------+------------+--------+  ICA Prox   72       19       1-39%    heterogenous           +----------+--------+--------+--------+------------+--------+  ICA Distal 65       23                                       +----------+--------+--------+--------+------------+--------+  ECA        116      10                                       +----------+--------+--------+--------+------------+--------+ +----------+--------+--------+--------+------------+  Subclavian PSV cm/s EDV cm/s Describe Arm Pressure  +----------+--------+--------+--------+------------+             117                                      +----------+--------+--------+--------+------------+ +---------+--------+--+--------+--+---------+  Vertebral PSV cm/s 63 EDV cm/s 21 Antegrade  +---------+--------+--+--------+--+---------+  ABI Findings: +--------+------------------+-----+---------+--------+  Right    Rt Pressure (mmHg) Index Waveform  Comment   +--------+------------------+-----+---------+--------+  Brachial                          triphasic           +--------+------------------+-----+---------+--------+  ATA                               triphasic           +--------+------------------+-----+---------+--------+  PTA                               triphasic           +--------+------------------+-----+---------+--------+  +--------+------------------+-----+---------+-------+  Left     Lt Pressure (mmHg) Index Waveform  Comment  +--------+------------------+-----+---------+-------+  Brachial                          triphasic          +--------+------------------+-----+---------+-------+  ATA                               triphasic          +--------+------------------+-----+---------+-------+  PTA                               triphasic          +--------+------------------+-----+---------+-------+  Right Doppler Findings: +--------+--------+-----+---------+--------+  Site     Pressure Index Doppler   Comments  +--------+--------+-----+---------+--------+  Brachial                triphasic           +--------+--------+-----+---------+--------+  Radial                  triphasic           +--------+--------+-----+---------+--------+  Ulnar                   triphasic           +--------+--------+-----+---------+--------+  Left Doppler Findings: +--------+--------+-----+---------+--------+  Site     Pressure Index Doppler   Comments  +--------+--------+-----+---------+--------+  Brachial                triphasic           +--------+--------+-----+---------+--------+  Radial                  triphasic           +--------+--------+-----+---------+--------+  Ulnar                   triphasic           +--------+--------+-----+---------+--------+  Summary: Right Carotid: Velocities in the right ICA are consistent with a 1-39% stenosis. Left Carotid: Velocities in the left ICA are consistent with a 1-39% stenosis. Vertebrals: Bilateral vertebral arteries demonstrate antegrade flow. Right Upper Extremity: Doppler waveforms remain within normal limits with right radial compression. Doppler waveforms remain within normal limits with right ulnar compression. Left Upper Extremity: Doppler waveforms remain within normal limits with left radial compression. Doppler waveform obliterate with left ulnar compression.  Electronically signed by  Deitra Mayo MD on 01/08/2022 at 1:58:15 PM.    Final     Cardiac Studies   Echo 01/07/22:  1. Suspicious for 14 x 5 mm thrombus (broad-based and fixed) at the level  of the inferior aspect of the apical left ventricular dyskinesia. Left  ventricular ejection fraction, by estimation, is 35 to 40%. The left  ventricle has mild to moderately  decreased function. The left ventricle demonstrates regional wall motion  abnormalities (see scoring diagram/findings for description). The left  ventricular internal cavity size was mildly dilated. There is moderate  concentric left ventricular  hypertrophy. Left ventricular diastolic parameters are consistent with  Grade II diastolic dysfunction (pseudonormalization). Elevated left atrial  pressure. There is moderate dyskinesis of the left ventricular, apical  segment. There is severe hypokinesis  of the left ventricular, mid-apical anterior wall and anteroseptal wall.   2. Right ventricular systolic function is normal. The right ventricular  size is normal.   3. Left atrial  size was mild to moderately dilated.   4. The mitral valve is normal in structure. Mild mitral valve  regurgitation. No evidence of mitral stenosis.   5. The aortic valve is tricuspid. Aortic valve regurgitation is not  visualized. No aortic stenosis is present.   6. The inferior vena cava is normal in size with greater than 50%  respiratory variability, suggesting right atrial pressure of 3 mmHg.   LHC 01/07/22:   Ost LM to Mid LM lesion is 95% stenosed.   Mid LAD lesion is 100% stenosed.   2nd Diag lesion is 95% stenosed.   Diagnostic Dominance: Right   Patient Profile     71 y.o. male with diabetes, obesity, hypertension, stroke, PAF, hyperlipidemia, and OSA on CPAP admitted with NSTEMI and acute systolic and diastolic heart failure.  Found to have critical LM disease on cath.   Assessment & Plan    # NSTEMI: # Hyperlipidemia: # s/p CABG: Patient likely had  an out of hospital MI that presented mostly as indigestion.  Echo now with acute systolic and diastolic heart failure.  Switched aspirin to 81mg , especially given that he will need anticoagulation for LV thrombus.  He was found to have critical left main disease.  CABG on 3/7 (LIMA-->LAD, SVG-->OM1, SVG-->OM1).   Given his progressive coronary disease despite adequate lipid control, will lower his goal to 55.  Increased atorvastatin to 40 mg.  He will need repeat lipids and a CMP in 2 to 3 months.  Continue carvedilol.  #Acute systolic diastolic heart failure: Echo this admission with LVEF 35-40% down from 45% previously.  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  Now s/p CABG.  Entresto and spironolactone on hold 2/2 AKI. GDMT as able.  # AKI:  Creatinine up to 2.06 from 1.6 yesterday.  Holding Entresto and spironolactone.  Will check CVP to see if he needs gentle IVF.  # LV thrombus:  Continue heparin.  Transition to warfarin when able post CABG.  # Hypertension:  BP better controlled. Holding Entresto and spironolactone as above.  # Obesity:  Consider Ozempic.  Needs cardiac rehab at discharge.   # DM:  Will need Jardiance/Farxiga given new HF.      For questions or updates, please contact [place] Please consult [email or web address] for contact info under        Signed, Skeet Latch, MD  01/09/2022, 10:16 AM

## 2022-01-09 NOTE — Progress Notes (Signed)
CCM paged regarding extubation of patient. Patient's VC did not meet protocol parameters after multiple attempts. MD advised to keep patient intubated and reassess with primary team.  ?

## 2022-01-09 NOTE — Progress Notes (Signed)
RT mote: Attempted to get a second NIF and VC post CABG wean protocol. Patients initial NIF was -10, second was -40. Initial VC was  2.0 the second was .5.Patients effort is questionable. ?

## 2022-01-09 NOTE — Progress Notes (Signed)
? ?   ?  [address] ?      [place] [address] ?            [phone]   ?              ?1 Day Post-Op ?Procedure(s) (LRB): ?CORONARY ARTERY BYPASS GRAFTING (CABG) TIMES THREE USING LEFT INTERNAL MAMMARY ARTERY AND ENDOSCOPICALLY HARVESTED BILATERAL GREATER SAPHENOUS VEINS (N/A) ?TRANSESOPHAGEAL ECHOCARDIOGRAM (TEE) (N/A) ?ENDOVEIN HARVEST OF GREATER SAPHENOUS VEIN (Right) ? ? ?Events: ?Extubated this am ?_______________________________________________________________ ?Vitals: ?BP (!) 101/56   Pulse 60   Temp 99.7 ?F (37.6 ?C)   Resp 20   Ht 5\' 5"  (1.651 m)   Wt 111.4 kg   SpO2 95%   BMI 40.87 kg/m?  ?Filed Weights  ? 01/08/22 0500 01/08/22 1023 01/09/22 0530  ?Weight: 105 kg 105 kg 111.4 kg  ? ? ? ?- Neuro: alert NAD ? ?- Cardiovascular: Sinus ? Drips: none.   ?CVP:  [2 mmHg-7 mmHg] 4 mmHg ? ?- Pulm: EWOB ? ? ?ABG ?   ?Component Value Date/Time  ? PHART 7.307 (L) 01/09/2022 0429  ? PCO2ART 45.9 01/09/2022 0429  ? PO2ART 68 (L) 01/09/2022 0429  ? HCO3 22.8 01/09/2022 0429  ? TCO2 24 01/09/2022 0429  ? ACIDBASEDEF 3.0 (H) 01/09/2022 0429  ? O2SAT 91 01/09/2022 0429  ? ? ?- Abd: ND ?- Extremity: warm ? ?05/08/2023Intake/Output   ?   03/07 0701 ?03/08 0700 03/08 0701 ?03/09 0700  ? P.O.    ? I.V. (mL/kg) 2755.1 (24.7)   ? Blood 585   ? NG/GT 120   ? IV Piggyback 1184.3   ? Total Intake(mL/kg) 4644.5 (41.7)   ? Urine (mL/kg/hr) 1785 (0.7)   ? Emesis/NG output 150   ? Stool    ? Blood 500   ? Chest Tube 238   ? Total Output 2673   ? Net +1971.5   ?     ? Emesis Occurrence 1 x   ?  ? ? ?_______________________________________________________________ ?Labs: ?CBC Latest Ref Rng & Units 01/09/2022 01/09/2022 01/09/2022  ?WBC 4.0 - 10.5 K/uL - 8.8 -  ?Hemoglobin 13.0 - 17.0 g/dL 11.2(L) 11.8(L) 11.2(L)  ?Hematocrit 39.0 - 52.0 % 33.0(L) 34.5(L) 33.0(L)  ?Platelets 150 - 400 K/uL - 120(L) -  ? ?CMP Latest Ref Rng & Units 01/09/2022 01/09/2022 01/09/2022  ?Glucose 70 - 99 mg/dL - 03/11/2022) -  ?BUN 8 - 23 mg/dL - 15 -   ?Creatinine 0.61 - 1.24 mg/dL - 496(P) -  ?Sodium 135 - 145 mmol/L 138 135 137  ?Potassium 3.5 - 5.1 mmol/L 4.3 4.2 4.4  ?Chloride 98 - 111 mmol/L - 106 -  ?CO2 22 - 32 mmol/L - 23 -  ?Calcium 8.9 - 10.3 mg/dL - 7.8(L) -  ?Total Protein 6.5 - 8.1 g/dL - - -  ?Total Bilirubin 0.3 - 1.2 mg/dL - - -  ?Alkaline Phos 38 - 126 U/L - - -  ?AST 15 - 41 U/L - - -  ?ALT 17 - 63 U/L - - -  ? ? ?CXR: ?PV congestion ? ?_______________________________________________________________ ? ?Assessment and Plan: ?POD 1 s/p CABG.  Hx of LV thrombus, and CHF ? ?Neuro: pain controlled ?CV: switching to carvedilol.  Will keep wires.  Will D/C A line ?Pulm: pulm hyg ?Renal: creat up.  Made good uring ?GI: diet ?Heme: stable ?ID: afebrile ?Endo: SSI ?Dispo: continue ICU care ? ? ?5.91(M Chasten Blaze ?01/09/2022 8:47 AM ? ? ?

## 2022-01-09 NOTE — Evaluation (Addendum)
Physical Therapy Re-Evaluation Patient Details Name: Raymond [name] MRN: [id number] DOB: [birth year] Today's Date: 01/09/2022  History of Present Illness  The pt is a 71 yo male presenting 3/4 with cough x2 days. upon work up, concern for new onset CHF. S/p L heart cath and coronary angiography 3/6. 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 ETT 3/7 - 3/8. PMH includes: CVA with R-sided deficits, HTN, HLD, DM II, OSA on CPAP, and depression.   Clinical Impression  Pt presents with condition above, now s/p CABG, and deficits mentioned below, see PT Problem List. PTA, he was IND without DME, living with his wife in a 1-level house with 3 STE. Reviewed sternal precautions with pt today, but pt needing reminders at times to remain compliant. Pt performing all functional mobility at a min guard-minA level this date using the [facility] walker for stability. Pt displays deficits in gross strength, balance, and activity tolerance. He would benefit from further acute PT services and follow-up with HHPT to maximize his independence and safety with all functional mobility.      Recommendations for follow up therapy are one component of a multi-disciplinary discharge planning process, led by the attending physician.  Recommendations may be updated based on patient status, additional functional criteria and insurance authorization.  Follow Up Recommendations Home health PT    Assistance Recommended at Discharge Intermittent Supervision/Assistance  Patient can return home with the following  A little help with walking and/or transfers;A little help with bathing/dressing/bathroom;Assistance with cooking/housework;Help with stairs or ramp for entrance;Assist for transportation    Equipment Recommendations BSC/3in1;Rolling walker (2 wheels) (pending progress)  Recommendations for Other Services  OT consult    Functional Status Assessment Patient has had a recent decline in their functional status and demonstrates the ability to make  significant improvements in function in a reasonable and predictable amount of time.     Precautions / Restrictions Precautions Precautions: Fall;Sternal Precaution Booklet Issued: No Precaution Comments: reviewed sternal precautions; chest tube; temp pacer Restrictions Weight Bearing Restrictions: No      Mobility  Bed Mobility Overal bed mobility: Needs Assistance Bed Mobility: Supine to Sit     Supine to sit: Min assist, HOB elevated     General bed mobility comments: increased time and effort, HOB elevated, cues to hug pillow to maintain sternal precautions. MinA to boost trunk up to sit.    Transfers Overall transfer level: Needs assistance Equipment used:  (eva walker) Transfers: Sit to/from Stand Sit to Stand: Min assist           General transfer comment: MinA to power up to stand and steady at [facility] walker, cuing pt to hug pillow during transfer.    Ambulation/Gait Ambulation/Gait assistance: Min guard Gait Distance (Feet): 120 Feet Assistive device: Fara Boros Gait Pattern/deviations: Step-through pattern, Decreased stride length Gait velocity: decreased Gait velocity interpretation: <1.31 ft/sec, indicative of household ambulator   General Gait Details: Pt with slow, but mostly steady gait using Eva walker. Min guard for safety, no LOB.  Stairs            Wheelchair Mobility    Modified Rankin (Stroke Patients Only)       Balance Overall balance assessment: Needs assistance Sitting-balance support: No upper extremity supported Sitting balance-Leahy Scale: Fair Sitting balance - Comments: Static sitting EOB, no LOB, supervision for safety   Standing balance support: During functional activity, Bilateral upper extremity supported, No upper extremity supported Standing balance-Leahy Scale: Fair Standing balance comment: Able to stand statically without UE  support, benefits from UE support to mobilize                              Pertinent Vitals/Pain Pain Assessment Pain Assessment: Faces Faces Pain Scale: Hurts little more Pain Location: sternum Pain Descriptors / Indicators: Discomfort, Grimacing, Operative site guarding Pain Intervention(s): Limited activity within patient's tolerance, Monitored during session, Repositioned    Home Living Family/patient expects to be discharged to:: Private residence Living Arrangements: Spouse/significant other Available Help at Discharge: Family;Available PRN/intermittently Type of Home: House Home Access: Stairs to enter Entrance Stairs-Rails: LawyerLeft;Right Entrance Stairs-Number of Steps: 3   Home Layout: One level Home Equipment: Cane - single point Additional Comments: pt family has made him walking sticks, states no limitations in mobility other than endurance    Prior Function Prior Level of Function : Driving;Independent/Modified Independent             Mobility Comments: walking without AD, endurance difficulties. no falls ADLs Comments: unable to don socks/shoes     Hand Dominance        Extremity/Trunk Assessment   Upper Extremity Assessment Upper Extremity Assessment: Defer to OT evaluation    Lower Extremity Assessment Lower Extremity Assessment: Generalized weakness    Cervical / Trunk Assessment Cervical / Trunk Assessment: Other exceptions Cervical / Trunk Exceptions: large body habitus  Communication   Communication: No difficulties  Cognition Arousal/Alertness: Awake/alert Behavior During Therapy: WFL for tasks assessed/performed Overall Cognitive Status: Impaired/Different from baseline Area of Impairment: Awareness, Problem solving, Safety/judgement, Memory                     Memory: Decreased short-term memory, Decreased recall of precautions   Safety/Judgement: Decreased awareness of deficits Awareness: Emergent Problem Solving: Decreased initiation, Requires verbal cues General Comments: Pt repeatedly  starting to bring legs off EOB as he thought he was getting up immediately upon PT arrival. PT repeatedly cuing pt to remain in bed until lines arranged and ready.        General Comments General comments (skin integrity, edema, etc.): VSS on 2L    Exercises     Assessment/Plan    PT Assessment Patient needs continued PT services  PT Problem List Decreased strength;Decreased activity tolerance;Decreased balance;Decreased mobility;Decreased cognition;Decreased knowledge of precautions;Cardiopulmonary status limiting activity       PT Treatment Interventions DME instruction;Gait training;Stair training;Functional mobility training;Therapeutic activities;Therapeutic exercise;Balance training;Patient/family education;Neuromuscular re-education;Cognitive remediation    PT Goals (Current goals can be found in the Care Plan section)  Acute Rehab PT Goals Patient Stated Goal: return home PT Goal Formulation: With patient/family Time For Goal Achievement: 01/23/22 Potential to Achieve Goals: Good    Frequency Min 3X/week     Co-evaluation               AM-PAC PT "6 Clicks" Mobility  Outcome Measure Help needed turning from your back to your side while in a flat bed without using bedrails?: A Little Help needed moving from lying on your back to sitting on the side of a flat bed without using bedrails?: A Little Help needed moving to and from a bed to a chair (including a wheelchair)?: A Little Help needed standing up from a chair using your arms (e.g., wheelchair or bedside chair)?: A Little Help needed to walk in hospital room?: A Little Help needed climbing 3-5 steps with a railing? : A Little 6 Click Score: 18    End of  Session Equipment Utilized During Treatment: Oxygen Activity Tolerance: Patient tolerated treatment well Patient left: with call bell/phone within reach;in chair;with family/visitor present Nurse Communication: Mobility status PT Visit Diagnosis:  Unsteadiness on feet (R26.81);Other abnormalities of gait and mobility (R26.89);Difficulty in walking, not elsewhere classified (R26.2);Muscle weakness (generalized) (M62.81)    Time: 3154-0086 PT Time Calculation (min) (ACUTE ONLY): 23 min   Charges:   PT Evaluation $PT Re-evaluation: 1 Re-eval PT Treatments $Gait Training: 8-22 mins        Raymond Gurney, PT, DPT Acute Rehabilitation Services  Pager: [phone] Office: [phone]   Jewel Baize 01/09/2022, 4:55 PM

## 2022-01-09 NOTE — Progress Notes (Signed)
? ?NAME:  Raymond [name], MRN:  [id number], DOB:  Apr 03, 1951, LOS: 4 ?ADMISSION DATE:  01/05/2022, CONSULTATION DATE:  01/08/22 ?REFERRING MD:  Kipp Brood, CHIEF COMPLAINT:  SOB  ? ?History of Present Illness:  ?71 year old man with hx of DM, COPD, OSA who presented with progressive SOB found to be in combined systolic/diastolic heart failure with an NSTEMI.  Subsequent workup showing LV thrombus and critical left main disease.  He was urgently evaluated by TCTS. ? ?Went for CABG x 3: LIMA to LAD, SVG to DIAGONAL, SVG to [facility] Dr. Kipp Brood on 01/08/22. ? 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 ?Significant Hospital Events: ?Including procedures, antibiotic start and stop dates in addition to other pertinent events   ?3/5 admit ?3/5 echo  1. Suspicious for 14 x 5 mm thrombus (broad-based and fixed) at the level  ?of the inferior aspect of the apical left ventricular dyskinesia. Left  ?ventricular ejection fraction, by estimation, is 35 to 40%. The left  ?ventricle has mild to moderately  ?decreased function. The left ventricle demonstrates regional wall motion  ?abnormalities (see scoring diagram/findings for description). The left  ?ventricular internal cavity size was mildly dilated. There is moderate  ?concentric left ventricular  ?hypertrophy. Left ventricular diastolic parameters are consistent with  ?Grade II diastolic dysfunction (pseudonormalization). Elevated left atrial  ?pressure. There is moderate dyskinesis of the left ventricular, apical  ?segment. There is severe hypokinesis  ?of the left ventricular, mid-apical anterior wall and anteroseptal wall.  ? 2. Right ventricular systolic function is normal. The right ventricular  ?size is normal.  ? 3. Left atrial size was mild to moderately dilated.  ? 4. The mitral valve is normal in structure. Mild mitral valve  ?regurgitation. No evidence of  mitral stenosis.  ? 5. The aortic valve is tricuspid. Aortic valve regurgitation is not  ?visualized. No aortic stenosis is present.  ? 6. The inferior vena cava is normal in size with greater than 50%  ?respiratory variability, suggesting right atrial pressure of 3 mmHg.  ?3/6 [place]  Conclusion   Ost LM to Mid LM lesion is 95% stenosed.   Mid LAD lesion is 100% stenosed.   2nd Diag lesion is 95% stenosed. ? ?Interim History / Subjective:  ?Extubated this morning  ?Pain controlled, just sore ? ?Objective   ?Blood pressure (!) 112/54, pulse 60, temperature 99.5 ?F (37.5 ?C), resp. rate (!) 21, height 5\' 5"  (1.651 m), weight 111.4 kg, SpO2 95 %. ?CVP:  [2 mmHg-7 mmHg] 4 mmHg  ?Vent Mode: CPAP;PSV ?FiO2 (%):  [40 %-70 %] 40 % ?Set Rate:  [4 bmp-16 bmp] 12 bmp ?Vt Set:  [490 mL] 490 mL ?PEEP:  [5 cmH20] 5 cmH20 ?Pressure Support:  [10 cmH20] 10 cmH20 ?Plateau Pressure:  [13 cmH20-19 cmH20] 19 cmH20  ? ?Intake/Output Summary (Last 24 hours) at 01/09/2022 [id number] ?Last data filed at 01/09/2022 0900 ?Gross per 24 hour  ?Intake 4667.26 ml  ?Output 2733 ml  ?Net 1934.26 ml  ? ?Filed Weights  ? 01/08/22 0500 01/08/22 1023 01/09/22 0530  ?Weight: 105 kg 105 kg 111.4 kg  ? ? ?Examination: ?General:  Elderly male sitting upright in bed in NAD ?HEENT: MM pink/moist, pupils 3/reactive ?Neuro:  oriented x 3, MAE ?CV: NSR, rr, no murmur, pacing wires and mediastinal/ pleural tubes in place  ?PULM:  non labored, clear anteriorly, somewhat shallow breaths  at times ?GI:  obese, protuberant, hypo BS, NT, foley ?Extremities: warm/dry, no LE edema  ?Skin: no rashes  ? ?BMP stable except for slight bump in sCr 1.23> 1.4, Mag 2.9, H/H stable, plts 108> 120 ?UOP 1.7L/ 24 hrs  ?Net +752ml ? ?Resolved Hospital Problem list   ?N/A ? ?Assessment & Plan:  ?Postop vent management ?Critical left main and LAD disease POD 0 CABGx3 LIMA to LAD, SVG to DIAGONAL, SVG to Om1 ?Hx moderate OSA ?Questionable COPD diagnosis ?Cerebrovascular disease ?DM2 well  controlled ? ?Drain management per TCTS ?Extubated this morning, cont aggressive pulmonary hygiene, IS/ flutter, CPAP qHS, mobilize per protocol ?Insulin gtt per TCTS protocol  ?Will continue to follow while in ICU ?Off pressors, starting BB today ?To be started on Eliquis once pacing wires removed ?Multimodal pain management ? ?Best Practice (right click and "Reselect all SmartList Selections" daily)  ? ?Diet/type: NPO, advance per protocol ?DVT prophylaxis: not indicated ?GI prophylaxis: H2B ?Lines: Central line ?Foley:  Yes, and it is still needed ?Code Status:  full code ?Last date of multidisciplinary goals of care discussion [Per TCTS] ? ?Labs   ?CBC: ?Recent Labs  ?Lab 01/07/22 ?[id number] 01/08/22 ?0615 01/08/22 ?1131 01/08/22 ?1402 01/08/22 ?1430 01/08/22 ?1618 01/08/22 ?2157 01/09/22 ?0154 01/09/22 ?[id number] 01/09/22 ?0320 01/09/22 ?[id number]  ?WBC 6.5 6.2  --   --   --  3.9* 9.9  --   --  8.8  --   ?HGB 14.0 13.7   < > 9.4*   < > 10.9*  6.9* 11.6* 8.5* 11.2* 11.8* 11.2*  ?HCT 39.3 38.6*   < > 27.0*   < > 32.0*  20.5* 33.3* 25.0* 33.0* 34.5* 33.0*  ?MCV 84.5 84.3  --   --   --  90.3 88.3  --   --  87.1  --   ?PLT 163 159  --  109*  --  62* 108*  --   --  120*  --   ? < > = values in this interval not displayed.  ? ? ?Basic Metabolic Panel: ?Recent Labs  ?Lab 01/06/22 ?0505 01/07/22 ?0336 01/08/22 ?0615 01/08/22 ?1131 01/08/22 ?1357 01/08/22 ?1430 01/08/22 ?1445 01/08/22 ?1501 01/08/22 ?1504 01/08/22 ?1618 01/08/22 ?2157 01/09/22 ?0154 01/09/22 ?[id number] 01/09/22 ?0320 01/09/22 ?[id number]  ?NA 141 138 137   < > 139   < > 139   < > 139   < > 136 146* 137 135 138  ?K 3.2* 3.5 3.7   < > 4.1   < > 4.4   < > 3.9   < > 5.2* 2.9* 4.4 4.2 4.3  ?CL 106 104 103   < > 102  --  101  --  103  --  107  --   --  106  --   ?CO2 26 25 26   --   --   --   --   --   --   --  22  --   --  23  --   ?GLUCOSE 115* 117* 125*   < > 123*  --  126*  --  137*  --  192*  --   --  160*  --   ?BUN 12 10 16    < > 13  --  13  --  12  --  13  --   --  15  --    ?CREATININE 1.25* 1.34* 1.34*   < > 1.10  --  1.10  --  1.00  --  1.23  --   --  1.40*  --   ?CALCIUM 8.6* 8.5* 8.6*  --   --   --   --   --   --   --  7.6*  --   --  7.8*  --   ?MG  --   --   --   --   --   --   --   --   --   --  2.9*  --   --  2.9*  --   ? < > = values in this interval not displayed.  ? ?GFR: ?Estimated Creatinine Clearance: 55 mL/min (A) (by C-G formula based on SCr of 1.4 mg/dL (H)). ?Recent Labs  ?Lab 01/08/22 ?0615 01/08/22 ?1618 01/08/22 ?2157 01/09/22 ?0320  ?WBC 6.2 3.9* 9.9 8.8  ? ? ?Liver Function Tests: ?No results for input(s): AST, ALT, ALKPHOS, BILITOT, PROT, ALBUMIN in the last 168 hours. ?No results for input(s): LIPASE, AMYLASE in the last 168 hours. ?No results for input(s): AMMONIA in the last 168 hours. ? ?ABG ?   ?Component Value Date/Time  ? PHART 7.307 (L) 01/09/2022 0429  ? PCO2ART 45.9 01/09/2022 0429  ? PO2ART 68 (L) 01/09/2022 0429  ? HCO3 22.8 01/09/2022 0429  ? TCO2 24 01/09/2022 0429  ? ACIDBASEDEF 3.0 (H) 01/09/2022 0429  ? O2SAT 91 01/09/2022 0429  ?  ? ?Coagulation Profile: ?Recent Labs  ?Lab 01/08/22 ?0615 01/08/22 ?1618  ?INR 1.1 1.4*  ? ? ?Cardiac Enzymes: ?No results for input(s): CKTOTAL, CKMB, CKMBINDEX, TROPONINI in the last 168 hours. ? ?HbA1C: ?Hgb A1c MFr Bld  ?Date/Time Value Ref Range Status  ?01/07/2022 07:40 PM 5.2 4.8 - 5.6 % Final  ?  Comment:  ?  (NOTE) ?Pre diabetes:          5.7%-6.4% ? ?Diabetes:              >6.4% ? ?Glycemic control for   <7.0% ?adults with diabetes ?  ?01/06/2022 01:46 AM 5.2 4.8 - 5.6 % Final  ?  Comment:  ?  (NOTE) ?Pre diabetes:          5.7%-6.4% ? ?Diabetes:              >6.4% ? ?Glycemic control for   <7.0% ?adults with diabetes ?  ? ? ?CBG: ?Recent Labs  ?Lab 01/07/22 ?1636 01/08/22 ?0142 01/08/22 ?1000 01/09/22 ?[id number] 01/09/22 ?0701  ?GLUCAP 209* 148* 138* 113* 108*  ? ? ?Critical care time: 30 mins ?  ? ? ?Kennieth Rad, ACNP ?[facility] ?01/09/2022, 9:22 AM ? ?See Amion for pager ?If no  response to pager, please call PCCM consult pager ?After 7:00 pm call Elink   ? ? ? ?

## 2022-01-09 NOTE — Progress Notes (Signed)
0700 - Report received from [place], Charity fundraiser.  All questions answered.  Safety checks performed.  All lines and drips verified. ?Hand hygiene performed before/after each pt contact. ?0800 - SBT passed. ?0820 - Pt extubated without incident.  Pt A&O x 4. ?0830 - Pt's wife called for an update. ?1015 - Dr. Duke Salvia rounded.  Dr. Cliffton Asters rounded. ?1030 - Pt's wife arrived to visit. ?1100 - Pt dangled on edge of bed.  A-line D/C'd.  Pt requesting to keep Foley until male Purewick can be placed. ?1130 - Pt's lunch arrived.  Pt's wife helping pt with lunch. ?1300 - [place], PT worked with pt.  They walked into the hallway, then pt placed in chair.  Pt bathed with soap and CHG.  Foley D/C'd. Purewick placed, per pt request d/t urgency and incontinence. ?1400 - Pt repositioned in chair. ?1500 - Pt repositioned in chair. ?1630 - Pt assisted to bed from chair without difficulty or distress.  Pt's dinner arrived. ?1900 - Report given to PM RN.  All questions answered. ? ? ? ?

## 2022-01-09 NOTE — Procedures (Signed)
Extubation Procedure Note ? ?Patient Details:   ?Name: Raymond [name] ?DOB: 09-Jul-1951 ?MRN: [id number] ?  ?Airway Documentation:  ?  ?Vent end date: 01/09/22 Vent end time: 0820  ? ?Evaluation ? O2 sats: stable throughout ?Complications: No apparent complications ?Patient did tolerate procedure well. ?Bilateral Breath Sounds: Clear ?  ?Patient extubated per MD order & placed on 4L [place]. Patient met weaning parameters prior with NIF -20, VC-950 & positive cuff leak. Patient able to speak & cough post extubation. ? ?Kathie Dike ?01/09/2022, 8:27 AM ? ?

## 2022-01-10 ENCOUNTER — Inpatient Hospital Stay (HOSPITAL_COMMUNITY): Payer: Medicare Other

## 2022-01-10 DIAGNOSIS — N179 Acute kidney failure, unspecified: Secondary | ICD-10-CM

## 2022-01-10 LAB — TYPE AND SCREEN
ABO/RH(D): O POS
Antibody Screen: NEGATIVE
Unit division: 0
Unit division: 0

## 2022-01-10 LAB — POCT I-STAT 7, (LYTES, BLD GAS, ICA,H+H)
Acid-base deficit: 2 mmol/L (ref 0.0–2.0)
Bicarbonate: 24.5 mmol/L (ref 20.0–28.0)
Calcium, Ion: 1.23 mmol/L (ref 1.15–1.40)
HCT: 28 % — ABNORMAL LOW (ref 39.0–52.0)
Hemoglobin: 9.5 g/dL — ABNORMAL LOW (ref 13.0–17.0)
O2 Saturation: 98 %
Patient temperature: 98.3
Potassium: 4.3 mmol/L (ref 3.5–5.1)
Sodium: 137 mmol/L (ref 135–145)
TCO2: 26 mmol/L (ref 22–32)
pCO2 arterial: 46.9 mmHg (ref 32–48)
pH, Arterial: 7.326 — ABNORMAL LOW (ref 7.35–7.45)
pO2, Arterial: 107 mmHg (ref 83–108)

## 2022-01-10 LAB — CBC
HCT: 31.9 % — ABNORMAL LOW (ref 39.0–52.0)
Hemoglobin: 10.9 g/dL — ABNORMAL LOW (ref 13.0–17.0)
MCH: 30.5 pg (ref 26.0–34.0)
MCHC: 34.2 g/dL (ref 30.0–36.0)
MCV: 89.4 fL (ref 80.0–100.0)
Platelets: 114 10*3/uL — ABNORMAL LOW (ref 150–400)
RBC: 3.57 MIL/uL — ABNORMAL LOW (ref 4.22–5.81)
RDW: 13.9 % (ref 11.5–15.5)
WBC: 8.3 10*3/uL (ref 4.0–10.5)
nRBC: 0 % (ref 0.0–0.2)

## 2022-01-10 LAB — COMPREHENSIVE METABOLIC PANEL
ALT: 6 U/L (ref 0–44)
AST: 17 U/L (ref 15–41)
Albumin: 3.4 g/dL — ABNORMAL LOW (ref 3.5–5.0)
Alkaline Phosphatase: 23 U/L — ABNORMAL LOW (ref 38–126)
Anion gap: 9 (ref 5–15)
BUN: 25 mg/dL — ABNORMAL HIGH (ref 8–23)
CO2: 25 mmol/L (ref 22–32)
Calcium: 8.2 mg/dL — ABNORMAL LOW (ref 8.9–10.3)
Chloride: 104 mmol/L (ref 98–111)
Creatinine, Ser: 1.73 mg/dL — ABNORMAL HIGH (ref 0.61–1.24)
GFR, Estimated: 41 mL/min — ABNORMAL LOW (ref >60)
Glucose, Bld: 139 mg/dL — ABNORMAL HIGH (ref 70–99)
Potassium: 4.4 mmol/L (ref 3.5–5.1)
Sodium: 138 mmol/L (ref 135–145)
Total Bilirubin: 0.6 mg/dL (ref 0.3–1.2)
Total Protein: 5.5 g/dL — ABNORMAL LOW (ref 6.5–8.1)

## 2022-01-10 LAB — CBC WITH DIFFERENTIAL/PLATELET
Abs Immature Granulocytes: 0.03 10*3/uL (ref 0.00–0.07)
Basophils Absolute: 0 10*3/uL (ref 0.0–0.1)
Basophils Relative: 0 %
Eosinophils Absolute: 0.2 10*3/uL (ref 0.0–0.5)
Eosinophils Relative: 3 %
HCT: 31.1 % — ABNORMAL LOW (ref 39.0–52.0)
Hemoglobin: 10.1 g/dL — ABNORMAL LOW (ref 13.0–17.0)
Immature Granulocytes: 0 %
Lymphocytes Relative: 12 %
Lymphs Abs: 0.8 10*3/uL (ref 0.7–4.0)
MCH: 29.7 pg (ref 26.0–34.0)
MCHC: 32.5 g/dL (ref 30.0–36.0)
MCV: 91.5 fL (ref 80.0–100.0)
Monocytes Absolute: 0.8 10*3/uL (ref 0.1–1.0)
Monocytes Relative: 12 %
Neutro Abs: 4.8 10*3/uL (ref 1.7–7.7)
Neutrophils Relative %: 73 %
Platelets: 102 10*3/uL — ABNORMAL LOW (ref 150–400)
RBC: 3.4 MIL/uL — ABNORMAL LOW (ref 4.22–5.81)
RDW: 13.9 % (ref 11.5–15.5)
WBC: 6.7 10*3/uL (ref 4.0–10.5)
nRBC: 0 % (ref 0.0–0.2)

## 2022-01-10 LAB — BPAM RBC
Blood Product Expiration Date: 202304052359
Blood Product Expiration Date: 202304052359
ISSUE DATE / TIME: 202303061402
ISSUE DATE / TIME: 202303071746
Unit Type and Rh: 5100
Unit Type and Rh: 5100

## 2022-01-10 LAB — GLUCOSE, CAPILLARY
Glucose-Capillary: 106 mg/dL — ABNORMAL HIGH (ref 70–99)
Glucose-Capillary: 109 mg/dL — ABNORMAL HIGH (ref 70–99)
Glucose-Capillary: 120 mg/dL — ABNORMAL HIGH (ref 70–99)
Glucose-Capillary: 122 mg/dL — ABNORMAL HIGH (ref 70–99)
Glucose-Capillary: 127 mg/dL — ABNORMAL HIGH (ref 70–99)
Glucose-Capillary: 129 mg/dL — ABNORMAL HIGH (ref 70–99)
Glucose-Capillary: 133 mg/dL — ABNORMAL HIGH (ref 70–99)
Glucose-Capillary: 136 mg/dL — ABNORMAL HIGH (ref 70–99)
Glucose-Capillary: 138 mg/dL — ABNORMAL HIGH (ref 70–99)
Glucose-Capillary: 143 mg/dL — ABNORMAL HIGH (ref 70–99)
Glucose-Capillary: 154 mg/dL — ABNORMAL HIGH (ref 70–99)
Glucose-Capillary: 161 mg/dL — ABNORMAL HIGH (ref 70–99)
Glucose-Capillary: 166 mg/dL — ABNORMAL HIGH (ref 70–99)
Glucose-Capillary: 175 mg/dL — ABNORMAL HIGH (ref 70–99)
Glucose-Capillary: 183 mg/dL — ABNORMAL HIGH (ref 70–99)
Glucose-Capillary: 183 mg/dL — ABNORMAL HIGH (ref 70–99)
Glucose-Capillary: 188 mg/dL — ABNORMAL HIGH (ref 70–99)
Glucose-Capillary: 192 mg/dL — ABNORMAL HIGH (ref 70–99)
Glucose-Capillary: 75 mg/dL (ref 70–99)
Glucose-Capillary: 83 mg/dL (ref 70–99)
Glucose-Capillary: 93 mg/dL (ref 70–99)

## 2022-01-10 LAB — BLOOD GAS, VENOUS
Acid-Base Excess: 0 mmol/L (ref 0.0–2.0)
Bicarbonate: 26.4 mmol/L (ref 20.0–28.0)
O2 Saturation: 76.5 %
Patient temperature: 37.7
pCO2, Ven: 51 mmHg (ref 44–60)
pH, Ven: 7.33 (ref 7.25–7.43)
pO2, Ven: 44 mmHg (ref 32–45)

## 2022-01-10 LAB — BASIC METABOLIC PANEL
Anion gap: 7 (ref 5–15)
BUN: 23 mg/dL (ref 8–23)
CO2: 25 mmol/L (ref 22–32)
Calcium: 8 mg/dL — ABNORMAL LOW (ref 8.9–10.3)
Chloride: 104 mmol/L (ref 98–111)
Creatinine, Ser: 2.06 mg/dL — ABNORMAL HIGH (ref 0.61–1.24)
GFR, Estimated: 34 mL/min — ABNORMAL LOW (ref >60)
Glucose, Bld: 166 mg/dL — ABNORMAL HIGH (ref 70–99)
Potassium: 3.6 mmol/L (ref 3.5–5.1)
Sodium: 136 mmol/L (ref 135–145)

## 2022-01-10 LAB — PHOSPHORUS: Phosphorus: 2.6 mg/dL (ref 2.5–4.6)

## 2022-01-10 LAB — MAGNESIUM: Magnesium: 2.4 mg/dL (ref 1.7–2.4)

## 2022-01-10 MED ORDER — ALBUMIN HUMAN 5 % IV SOLN
INTRAVENOUS | Status: AC
  Administered 2022-01-10: 11:00:00 25 g via INTRAVENOUS
  Filled 2022-01-10: qty 500

## 2022-01-10 MED ORDER — ALBUMIN HUMAN 5 % IV SOLN: 25.0000 g | Freq: Once | INTRAVENOUS | Status: AC

## 2022-01-10 MED ORDER — APIXABAN 5 MG PO TABS
5.0000 mg | ORAL_TABLET | Freq: Two times a day (BID) | ORAL | Status: DC
  Administered 2022-01-10 – 2022-01-15 (×11): 5 mg via ORAL
  Filled 2022-01-10 (×11): qty 1

## 2022-01-10 MED ORDER — POTASSIUM CHLORIDE CRYS ER 20 MEQ PO TBCR
20.0000 meq | EXTENDED_RELEASE_TABLET | ORAL | Status: AC
  Administered 2022-01-10 (×3): 20 meq via ORAL
  Filled 2022-01-10 (×2): qty 1

## 2022-01-10 MED ORDER — METOCLOPRAMIDE HCL 5 MG/ML IJ SOLN
5.0000 mg | Freq: Four times a day (QID) | INTRAMUSCULAR | Status: AC
  Administered 2022-01-10 – 2022-01-12 (×8): 5 mg via INTRAVENOUS
  Filled 2022-01-10 (×8): qty 2

## 2022-01-10 MED ORDER — POLYETHYLENE GLYCOL 3350 17 G PO PACK
17.0000 g | PACK | Freq: Every day | ORAL | Status: DC
  Administered 2022-01-10 – 2022-01-15 (×6): 17 g via ORAL
  Filled 2022-01-10 (×6): qty 1

## 2022-01-10 NOTE — Progress Notes (Signed)
Patient ID: Raymond [name], male   DOB: 08-24-1951, 71 y.o.   MRN: [id number] ?TCTS Evening Rounds: ? ?Hemodynamically stable in sinus rhythm. ? ?Developed confusion today as well as ileus on KUB with distended abdomen. ? ?Sats 98%  ? ?Urine output ok ?

## 2022-01-10 NOTE — Progress Notes (Signed)
? ?   ?  [address] ?      [place] [address] ?            [phone]   ?              ?2 Days Post-Op ?Procedure(s) (LRB): ?CORONARY ARTERY BYPASS GRAFTING (CABG) TIMES THREE USING LEFT INTERNAL MAMMARY ARTERY AND ENDOSCOPICALLY HARVESTED BILATERAL GREATER SAPHENOUS VEINS (N/A) ?TRANSESOPHAGEAL ECHOCARDIOGRAM (TEE) (N/A) ?ENDOVEIN HARVEST OF GREATER SAPHENOUS VEIN (Right) ? ? ?Events: ?No events ?_______________________________________________________________ ?Vitals: ?BP 99/60   Pulse 62   Temp 98.8 ?F (37.1 ?C) (Axillary)   Resp 18   Ht 5\' 5"  (1.651 m)   Wt 109 kg   SpO2 94%   BMI 39.99 kg/m?  ?Filed Weights  ? 01/08/22 1023 01/09/22 0530 01/10/22 0600  ?Weight: 105 kg 111.4 kg 109 kg  ? ? ? ?- Neuro: alert NAD ? ?- Cardiovascular: Sinus ? Drips: none.   ?CVP:  [4 mmHg-11 mmHg] 8 mmHg ? ?- Pulm: EWOB ? ? ?ABG ?   ?Component Value Date/Time  ? PHART 7.350 01/09/2022 0930  ? PCO2ART 38.0 01/09/2022 0930  ? PO2ART 70 (L) 01/09/2022 0930  ? HCO3 20.9 01/09/2022 0930  ? TCO2 22 01/09/2022 0930  ? ACIDBASEDEF 4.0 (H) 01/09/2022 0930  ? O2SAT 93 01/09/2022 0930  ? ? ?- Abd: ND ?- Extremity: warm ? ?05/08/2023Intake/Output   ?   03/08 0701 ?03/09 0700 03/09 0701 ?03/10 0700  ? P.O. 960   ? I.V. (mL/kg) 86.9 (0.8)   ? Blood    ? NG/GT    ? IV Piggyback 200   ? Total Intake(mL/kg) 1246.9 (11.4)   ? Urine (mL/kg/hr) 430 (0.2)   ? Emesis/NG output    ? Blood    ? Chest Tube 230   ? Total Output 660   ? Net +586.9   ?     ?  ? ? ?_______________________________________________________________ ?Labs: ?CBC Latest Ref Rng & Units 01/10/2022 01/09/2022 01/09/2022  ?WBC 4.0 - 10.5 K/uL 8.3 8.8 -  ?Hemoglobin 13.0 - 17.0 g/dL 10.9(L) 11.5(L) 10.2(L)  ?Hematocrit 39.0 - 52.0 % 31.9(L) 33.2(L) 30.0(L)  ?Platelets 150 - 400 K/uL 114(L) 125(L) -  ? ?CMP Latest Ref Rng & Units 01/10/2022 01/09/2022 01/09/2022  ?Glucose 70 - 99 mg/dL 03/11/2022) 607(P) -  ?BUN 8 - 23 mg/dL 23 19 -  ?Creatinine 0.61 - 1.24 mg/dL 710(G) 2.69(S) -   ?Sodium 135 - 145 mmol/L 136 135 137  ?Potassium 3.5 - 5.1 mmol/L 3.6 3.9 3.8  ?Chloride 98 - 111 mmol/L 104 103 -  ?CO2 22 - 32 mmol/L 25 25 -  ?Calcium 8.9 - 10.3 mg/dL 8.0(L) 7.9(L) -  ?Total Protein 6.5 - 8.1 g/dL - - -  ?Total Bilirubin 0.3 - 1.2 mg/dL - - -  ?Alkaline Phos 38 - 126 U/L - - -  ?AST 15 - 41 U/L - - -  ?ALT 17 - 63 U/L - - -  ? ? ?CXR: ?PV congestion ? ?_______________________________________________________________ ? ?Assessment and Plan: ?POD 2 s/p CABG.  Hx of LV thrombus, and CHF ? ?Neuro: pain controlled ?CV: switching to carvedilol.  Will remove wires ?Pulm: pulm hyg ?Renal: creat up.  Holding on diuresis.  Will continue to monitor uop ?GI: diet ?Heme: stable ?ID: afebrile ?Endo: SSI ?Dispo: continue ICU care given creat. ? ? ?8.54(O Aryel Edelen ?01/10/2022 7:26 AM ? ? ?

## 2022-01-10 NOTE — Progress Notes (Signed)
? ?NAME:  Raymond [name], MRN:  [id number], DOB:  01-08-1951, LOS: 5 ?ADMISSION DATE:  01/05/2022, CONSULTATION DATE:  01/08/22 ?REFERRING MD:  Kipp Brood, CHIEF COMPLAINT:  SOB  ? ?History of Present Illness:  ?71 year old man with hx of DM, COPD, OSA who presented with progressive SOB found to be in combined systolic/diastolic heart failure with an NSTEMI.  Subsequent workup showing LV thrombus and critical left main disease.  He was urgently evaluated by TCTS. ? ?Went for CABG x 3: LIMA to LAD, SVG to DIAGONAL, SVG to [facility] Dr. Kipp Brood on 01/08/22. ? 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 ?Significant Hospital Events: ?Including procedures, antibiotic start and stop dates in addition to other pertinent events   ?3/5 admit ?3/5 echo  1. Suspicious for 14 x 5 mm thrombus (broad-based and fixed) at the level  ?of the inferior aspect of the apical left ventricular dyskinesia. Left  ?ventricular ejection fraction, by estimation, is 35 to 40%. The left  ?ventricle has mild to moderately  ?decreased function. The left ventricle demonstrates regional wall motion  ?abnormalities (see scoring diagram/findings for description). The left  ?ventricular internal cavity size was mildly dilated. There is moderate  ?concentric left ventricular  ?hypertrophy. Left ventricular diastolic parameters are consistent with  ?Grade II diastolic dysfunction (pseudonormalization). Elevated left atrial  ?pressure. There is moderate dyskinesis of the left ventricular, apical  ?segment. There is severe hypokinesis  ?of the left ventricular, mid-apical anterior wall and anteroseptal wall.  ? 2. Right ventricular systolic function is normal. The right ventricular  ?size is normal.  ? 3. Left atrial size was mild to moderately dilated.  ? 4. The mitral valve is normal in structure. Mild mitral valve  ?regurgitation. No evidence of  mitral stenosis.  ? 5. The aortic valve is tricuspid. Aortic valve regurgitation is not  ?visualized. No aortic stenosis is present.  ? 6. The inferior vena cava is normal in size with greater than 50%  ?respiratory variability, suggesting right atrial pressure of 3 mmHg.  ?3/6 [place]  Conclusion   Ost LM to Mid LM lesion is 95% stenosed.   Mid LAD lesion is 100% stenosed.   2nd Diag lesion is 95% stenosed. ? ?Interim History / Subjective:  ?Some transient symptomatic hypotension this AM after pacers/tubes pulled. ? ?Denies pain, breathing stable. ? ?No stool in 48h. ? ?Objective   ?Blood pressure 113/69, pulse (!) 58, temperature 98.3 ?F (36.8 ?C), resp. rate 16, height 5\' 5"  (1.651 m), weight 109 kg, SpO2 99 %. ?CVP:  [1 mmHg-25 mmHg] 21 mmHg  ?   ? ?Intake/Output Summary (Last 24 hours) at 01/10/2022 1205 ?Last data filed at 01/10/2022 0945 ?Gross per 24 hour  ?Intake 683 ml  ?Output 470 ml  ?Net 213 ml  ? ? ?Filed Weights  ? 01/08/22 1023 01/09/22 0530 01/10/22 0600  ?Weight: 105 kg 111.4 kg 109 kg  ? ? ?Examination: ?No distress ?Lungs diminished bases ?Mildly confused ?Abdomen protuberant, hypoactive BS ?No edema ? ?Cr up a bit, oliguric ?CBC stable ?CXR low lung volumes ? ?Resolved Hospital Problem list   ?Postop vent management ? ?Assessment & Plan:  ?Critical left main and LAD disease POD 2 CABGx3 LIMA to LAD, SVG to DIAGONAL, SVG to Om1 ?Hx moderate OSA ?Questionable COPD diagnosis ?Cerebrovascular disease ?DM2 well controlled ?Question ileus ?Encephalopathy question ICU delirium- mildly acidotic on  ABG ? ?Encourage IS, wean O2 for sats >90% ?CPAP qHS ?Check bladder scan, await return of renal function ?Encourage day/night cycles, avoid sedating meds ?Albumin given for transient hypotension, monitor ?Check KUB r/o ileus ?Will follow while in ICU ? ?Best Practice (right click and "Reselect all SmartList Selections" daily)  ? ?Diet/type: NPO, advance per protocol ?DVT prophylaxis: not indicated ?GI prophylaxis:  H2B ?Lines: Central line ?Foley:  Yes, and it is still needed ?Code Status:  full code ?Last date of multidisciplinary goals of care discussion [Per TCTS] ? ?Erskine Emery MD PCCM ? ?

## 2022-01-10 NOTE — Progress Notes (Signed)
Patient's nurse Channing Mutters, paged me at 11:24 am to report BP improved with Albumin;however, patient is more confused but no focal deficits or weakness. Glucose check was 177. He is on 2 liters of oxygen with saturation 99%. I discussed again with Dr. Cliffton Asters who recommended close monitoring for now. Nurse to inform me if any other changes. ?

## 2022-01-10 NOTE — Progress Notes (Addendum)
Patient's nurse, Channing Mutters, paged me to evaluate patient as after having chest tubes and epicardial pacing wires removed at 9:00 am,  patient became hypotensive and patient had slurred speech and became diaphoretic. BP upon my arrival was 87/71, HR 59. Patient in no acute distress lying in bed. He was alert, oriented, and had no focal neuro deficits and speech was NOT slurred. Family in room. His blood pressure has been in the high 90's -low 100's. He was already given Carvedilol 3.125 mg. He has not had diuresis this am as creatinine was up to 2. His H and H this am was 10.9 and 31.9. I discussed with Dr. Cliffton Asters and patient to be given 500 ml Albumin. He will continue to be monitored closely. ?

## 2022-01-10 NOTE — Progress Notes (Addendum)
OT Cancellation Note ? ?Patient Details ?Name: Raymond [name] ?MRN: [id number] ?DOB: 1951-04-18 ? ? ?Cancelled Treatment:    Reason Eval/Treat Not Completed: Medical issues which prohibited therapy. Attempt @ 1034. (Currently with hypotensive episode. Supine in bed with nursing at bedside and assessing for additional medication needs. Will return as schedule allows.) ? ?Second attempt @ 1620: nauseous and RN request hold at this time.  ? ?Theodoro Grist Hilda Rynders ?Georgena Weisheit MSOT, OTR/L ?Acute Rehab ?Pager: [phone] ?Office: [phone] ?01/10/2022, 10:34 AM ?

## 2022-01-10 NOTE — Progress Notes (Addendum)
eLink Physician-Brief Progress Note ?Patient Name: Raymond [name] ?DOB: [birth year] ?MRN: [id number] ? ? ?Date of Service ? 01/10/2022  ?HPI/Events of Note ? RN reports that patient continues to have confusion (not new) and has a firm distended belly. He developed ileus today and has had medications given (reglan, dulcolax, colace, Miralax) . Has not had a BM. Prior KUB showed ileus. No nausea or vomiting per RN - she had checked with patient as well who reports no belly pain except when on deep palpation. Does not look like he is in distress on camera. Currently not on any infusions.   ?eICU Interventions ? Get a VBG and a set of labs- cbc cmp mag and phos. Will see if anything reversible there to treat for his ileus.  ?RN will also notify the surgery team . RN feels belly is firm and distended however not sure if this is changed   ? ? ? ?Intervention Category ?Major Interventions: Change in mental status - evaluation and management ? ?Raymond [name] ?01/10/2022, 8:39 PM ? ?Addendum at 9:55 pm ?I followed up on labs ?Stable counts on CBC ?Sodium, Liver enzymes, K/mag/phos are all ok and creatinine actually improving  ?VBG also not with any significant changes ?Continue as planned  ?

## 2022-01-11 LAB — CBC WITH DIFFERENTIAL/PLATELET
Abs Immature Granulocytes: 0.02 10*3/uL (ref 0.00–0.07)
Basophils Absolute: 0 10*3/uL (ref 0.0–0.1)
Basophils Relative: 0 %
Eosinophils Absolute: 0.2 10*3/uL (ref 0.0–0.5)
Eosinophils Relative: 3 %
HCT: 30.9 % — ABNORMAL LOW (ref 39.0–52.0)
Hemoglobin: 9.9 g/dL — ABNORMAL LOW (ref 13.0–17.0)
Immature Granulocytes: 0 %
Lymphocytes Relative: 15 %
Lymphs Abs: 1.1 10*3/uL (ref 0.7–4.0)
MCH: 29.6 pg (ref 26.0–34.0)
MCHC: 32 g/dL (ref 30.0–36.0)
MCV: 92.5 fL (ref 80.0–100.0)
Monocytes Absolute: 0.9 10*3/uL (ref 0.1–1.0)
Monocytes Relative: 13 %
Neutro Abs: 4.7 10*3/uL (ref 1.7–7.7)
Neutrophils Relative %: 69 %
Platelets: 114 10*3/uL — ABNORMAL LOW (ref 150–400)
RBC: 3.34 MIL/uL — ABNORMAL LOW (ref 4.22–5.81)
RDW: 13.9 % (ref 11.5–15.5)
WBC: 6.9 10*3/uL (ref 4.0–10.5)
nRBC: 0 % (ref 0.0–0.2)

## 2022-01-11 LAB — BASIC METABOLIC PANEL
Anion gap: 6 (ref 5–15)
BUN: 25 mg/dL — ABNORMAL HIGH (ref 8–23)
CO2: 25 mmol/L (ref 22–32)
Calcium: 8 mg/dL — ABNORMAL LOW (ref 8.9–10.3)
Chloride: 105 mmol/L (ref 98–111)
Creatinine, Ser: 1.64 mg/dL — ABNORMAL HIGH (ref 0.61–1.24)
GFR, Estimated: 44 mL/min — ABNORMAL LOW (ref >60)
Glucose, Bld: 118 mg/dL — ABNORMAL HIGH (ref 70–99)
Potassium: 4.2 mmol/L (ref 3.5–5.1)
Sodium: 136 mmol/L (ref 135–145)

## 2022-01-11 LAB — GLUCOSE, CAPILLARY
Glucose-Capillary: 110 mg/dL — ABNORMAL HIGH (ref 70–99)
Glucose-Capillary: 140 mg/dL — ABNORMAL HIGH (ref 70–99)
Glucose-Capillary: 127 mg/dL — ABNORMAL HIGH (ref 70–99)
Glucose-Capillary: 129 mg/dL — ABNORMAL HIGH (ref 70–99)
Glucose-Capillary: 93 mg/dL (ref 70–99)

## 2022-01-11 MED ORDER — SODIUM CHLORIDE 0.9 % IV SOLN: 250.0000 mL | INTRAVENOUS | Status: DC | PRN

## 2022-01-11 MED ORDER — SODIUM CHLORIDE 0.9% FLUSH
3.0000 mL | Freq: Two times a day (BID) | INTRAVENOUS | Status: DC
  Administered 2022-01-11: 3 mL via INTRAVENOUS

## 2022-01-11 MED ORDER — SODIUM CHLORIDE 0.9% FLUSH: 3.0000 mL | INTRAVENOUS | Status: DC | PRN

## 2022-01-11 MED ORDER — CARVEDILOL 6.25 MG PO TABS
6.2500 mg | ORAL_TABLET | Freq: Two times a day (BID) | ORAL | Status: DC
  Administered 2022-01-12 – 2022-01-15 (×7): 6.25 mg via ORAL
  Filled 2022-01-11 (×7): qty 1

## 2022-01-11 MED ORDER — ~~LOC~~ CARDIAC SURGERY, PATIENT & FAMILY EDUCATION: Freq: Once | Status: AC

## 2022-01-11 NOTE — Progress Notes (Signed)
Patient ID: Raymond [name], male   DOB: 12/06/1950, 71 y.o.   MRN: [id number] ?TCTS Evening Rounds: ? ?Hemodynamically stable, NSR.  Sats 99% 2L ?Passing a lot of flatus but no BM yet. ?Waiting for bed on stepdown. ?

## 2022-01-11 NOTE — Progress Notes (Signed)
Pt placed on cpap for the night. °

## 2022-01-11 NOTE — Progress Notes (Signed)
CARDIAC REHAB PHASE I  ? ?Pt and wife happy with pts progress thus far. On RA in room, sats 91-95. Stressed importance of IS use, ambulation, and sitting in chair throughout weekend. Stomach with audible gurgles. PT at bedside to work with pt. Will continue to follow throughout his hospital stay. ? ?[id number] ?Reynold Bowen, RN BSN ?01/11/2022 ?2:20 PM ? ?

## 2022-01-11 NOTE — Progress Notes (Signed)
Patient continues to progress well and will be transferred to progressive care for today ? ?PCCM will sign off. Thank you for the opportunity to participate in this patient's care. Please contact if we can be of further assistance. ? ?Daleigh Pollinger D. Harris, NP-C ?[facility] ?Personal contact information can be found on Amion  ?01/11/2022, 9:13 AM ? ? ?

## 2022-01-11 NOTE — Plan of Care (Signed)
?  Problem: Education: ?Goal: Ability to demonstrate management of disease process will improve ?Outcome: Progressing ?Goal: Ability to verbalize understanding of medication therapies will improve ?Outcome: Progressing ?Goal: Individualized Educational Video(s) ?Outcome: Progressing ?  ?Problem: Activity: ?Goal: Capacity to carry out activities will improve ?Outcome: Progressing ?  ?Problem: Cardiac: ?Goal: Ability to achieve and maintain adequate cardiopulmonary perfusion will improve ?Outcome: Progressing ?  ?Problem: Education: ?Goal: Ability to demonstrate management of disease process will improve ?Outcome: Progressing ?Goal: Ability to verbalize understanding of medication therapies will improve ?Outcome: Progressing ?Goal: Individualized Educational Video(s) ?Outcome: Progressing ?  ?Problem: Activity: ?Goal: Capacity to carry out activities will improve ?Outcome: Progressing ?  ?Problem: Cardiac: ?Goal: Ability to achieve and maintain adequate cardiopulmonary perfusion will improve ?Outcome: Progressing ?  ?Problem: Education: ?Goal: Knowledge of General Education information will improve ?Description: Including pain rating scale, medication(s)/side effects and non-pharmacologic comfort measures ?Outcome: Progressing ?  ?Problem: Health Behavior/Discharge Planning: ?Goal: Ability to manage health-related needs will improve ?Outcome: Progressing ?  ?Problem: Clinical Measurements: ?Goal: Ability to maintain clinical measurements within normal limits will improve ?Outcome: Progressing ?Goal: Will remain free from infection ?Outcome: Progressing ?Goal: Diagnostic test results will improve ?Outcome: Progressing ?Goal: Respiratory complications will improve ?Outcome: Progressing ?Goal: Cardiovascular complication will be avoided ?Outcome: Progressing ?  ?Problem: Activity: ?Goal: Risk for activity intolerance will decrease ?Outcome: Progressing ?  ?Problem: Nutrition: ?Goal: Adequate nutrition will be  maintained ?Outcome: Progressing ?  ?Problem: Coping: ?Goal: Level of anxiety will decrease ?Outcome: Progressing ?  ?Problem: Elimination: ?Goal: Will not experience complications related to bowel motility ?Outcome: Progressing ?Goal: Will not experience complications related to urinary retention ?Outcome: Progressing ?  ?Problem: Pain Managment: ?Goal: General experience of comfort will improve ?Outcome: Progressing ?  ?Problem: Safety: ?Goal: Ability to remain free from injury will improve ?Outcome: Progressing ?  ?Problem: Skin Integrity: ?Goal: Risk for impaired skin integrity will decrease ?Outcome: Progressing ?  ?

## 2022-01-11 NOTE — Progress Notes (Signed)
Progress Note  Patient Name: Raymond [name] Date of Encounter: 01/11/2022  [facility] Cardiologist: None   Subjective   Feeling better today.  Without complaint.  Inpatient Medications    Scheduled Meds:  acetaminophen  1,000 mg Oral Q6H   Or   acetaminophen (TYLENOL) oral liquid 160 mg/5 mL  1,000 mg Per Tube Q6H   apixaban  5 mg Oral BID   aspirin EC  325 mg Oral Daily   Or   aspirin  324 mg Per Tube Daily   atorvastatin  40 mg Oral q1800   bisacodyl  10 mg Oral Daily   Or   bisacodyl  10 mg Rectal Daily   carvedilol  3.125 mg Oral BID WC   Chlorhexidine Gluconate Cloth  6 each Topical Daily   [facility], Patient & Family Education   Does not apply Once   docusate sodium  200 mg Oral Daily   insulin aspart  0-24 Units Subcutaneous Q4H   mouth rinse  15 mL Mouth Rinse BID   metoCLOPramide (REGLAN) injection  5 mg Intravenous Q6H   pantoprazole  40 mg Oral Daily   polyethylene glycol  17 g Oral Daily   sertraline  100 mg Oral Daily   sodium chloride flush  10-40 mL Intracatheter Q12H   sodium chloride flush  3 mL Intravenous Q12H   sodium chloride flush  3 mL Intravenous Q12H   Continuous Infusions:  sodium chloride 20 mL/hr at 01/10/22 1834   sodium chloride     sodium chloride     sodium chloride     dexmedetomidine (PRECEDEX) IV infusion Stopped (01/08/22 2203)   DOBUTamine Stopped (01/08/22 1833)   insulin Stopped (01/09/22 1003)   lactated ringers     lactated ringers Stopped (01/08/22 2215)   lactated ringers Stopped (01/09/22 1108)   niCARDipine     nitroGLYCERIN Stopped (01/08/22 1703)   norepinephrine (LEVOPHED) Adult infusion     PRN Meds: sodium chloride, sodium chloride, albuterol, dextrose, lactated ringers, metoprolol tartrate, morphine injection, ondansetron (ZOFRAN) IV, oxyCODONE, sodium chloride flush, sodium chloride flush, sodium chloride flush, traMADol   Vital Signs    Vitals:   01/11/22 0400 01/11/22 0500  01/11/22 0600 01/11/22 0845  BP: 140/72 (!) 146/80 (!) 150/75   Pulse: 71 69 72   Resp: 18 17 (!) 24   Temp: 98.9 F (37.2 C)   98.1 F (36.7 C)  TempSrc:    Oral  SpO2: 98% 93% 99%   Weight:   109.7 kg   Height:        Intake/Output Summary (Last 24 hours) at 01/11/2022 0919 Last data filed at 01/11/2022 0700 Gross per 24 hour  Intake 203 ml  Output 570 ml  Net -367 ml   Last 3 Weights 01/11/2022 01/10/2022 01/09/2022  Weight (lbs) 241 lb 13.5 oz 240 lb 4.8 oz 245 lb 9.5 oz  Weight (kg) 109.7 kg 109 kg 111.4 kg  Some encounter information is confidential and restricted. Go to Review Flowsheets activity to see all data.      Telemetry    Sinus rhythm. - Personally Reviewed  ECG    Sinus bradycardia.  Rate 54 bpm.  Lateral TWI. - Personally Reviewed  Physical Exam   VS:  BP (!) 150/75    Pulse 72    Temp 98.1 F (36.7 C) (Oral)    Resp (!) 24    Ht 5\' 5"  (1.651 m)    Wt 109.7 kg  SpO2 99%    BMI 40.25 kg/m  , BMI Body mass index is 40.25 kg/m. GENERAL:  No acute distress HEENT: Pupils equal round and reactive, fundi not visualized, oral mucosa unremarkable NECK:  R IJ triple lumen catheter.  No jugular venous distention, waveform within normal limits, carotid upstroke brisk and symmetric, no bruits, no thyromegaly LUNGS:  Clear to auscultation bilaterally CHEST: Midline sternotomy dressing C/D/I HEART:  RRR.  PMI not displaced or sustained,S1 and S2 within normal limits, no S3, no S4, no clicks, no rubs, no murmurs ABD:  Flat, positive bowel sounds normal in frequency in pitch, no bruits, no rebound, no guarding, no midline pulsatile mass, no hepatomegaly, no splenomegaly EXT:  2 plus pulses throughout, R UE edema, no cyanosis no clubbing SKIN:  No rashes no nodules NEURO:  Cranial nerves II through XII grossly intact, motor grossly intact throughout [facility]:  Cognitively intact, oriented to person place and time   Labs    High Sensitivity Troponin:   Recent Labs  Lab  01/05/22 1627 01/05/22 1758 01/06/22 0146 01/06/22 0437  TROPONINIHS 34* 38* 95* 97*     Chemistry Recent Labs  Lab 01/09/22 0320 01/09/22 0429 01/09/22 1604 01/10/22 0328 01/10/22 1244 01/10/22 2101 01/11/22 0452  NA 135   < > 135 136 137 138 136  K 4.2   < > 3.9 3.6 4.3 4.4 4.2  CL 106  --  103 104  --  104 105  CO2 23  --  25 25  --  25 25  GLUCOSE 160*  --  192* 166*  --  139* 118*  BUN 15  --  19 23  --  25* 25*  CREATININE 1.40*  --  1.63* 2.06*  --  1.73* 1.64*  CALCIUM 7.8*  --  7.9* 8.0*  --  8.2* 8.0*  MG 2.9*  --  2.6*  --   --  2.4  --   PROT  --   --   --   --   --  5.5*  --   ALBUMIN  --   --   --   --   --  3.4*  --   AST  --   --   --   --   --  17  --   ALT  --   --   --   --   --  6  --   ALKPHOS  --   --   --   --   --  23*  --   BILITOT  --   --   --   --   --  0.6  --   GFRNONAA 53*  --  44* 34*  --  41* 44*  ANIONGAP 6  --  7 7  --  9 6   < > = values in this interval not displayed.    Lipids  Recent Labs  Lab 01/06/22 0146  CHOL 124  TRIG 111  HDL 35*  LDLCALC 67  CHOLHDL 3.5    Hematology Recent Labs  Lab 01/10/22 0328 01/10/22 1244 01/10/22 2101 01/11/22 0452  WBC 8.3  --  6.7 6.9  RBC 3.57*  --  3.40* 3.34*  HGB 10.9* 9.5* 10.1* 9.9*  HCT 31.9* 28.0* 31.1* 30.9*  MCV 89.4  --  91.5 92.5  MCH 30.5  --  29.7 29.6  MCHC 34.2  --  32.5 32.0  RDW 13.9  --  13.9 13.9  PLT  114*  --  102* 114*   Thyroid No results for input(s): TSH, FREET4 in the last 168 hours.  BNP Recent Labs  Lab 01/05/22 1748  BNP 1,096.9*    DDimer No results for input(s): DDIMER in the last 168 hours.   Radiology    DG Chest Port 1 View  Result Date: 01/10/2022 CLINICAL DATA:  Shortness of breath EXAM: PORTABLE CHEST 1 VIEW COMPARISON:  Two days ago FINDINGS: Right IJ line with tip at the upper SVC. Tracheal and esophageal extubation. Low volume chest with hazy density at the bases, greater on the left, likely atelectasis with possible pleural fluid.  Postoperative heart with cardiopericardial enlargement that is similar to prior. IMPRESSION: Extubation with similar degree of low lung volumes and atelectasis. Electronically Signed   By: Jorje Guild M.D.   On: 01/10/2022 07:01   DG Abd Portable 1V  Result Date: 01/10/2022 CLINICAL DATA:  Abdominal distension EXAM: PORTABLE ABDOMEN - 1 VIEW COMPARISON:  06/09/2017 FINDINGS: Gaseous distension of large bowel throughout the abdomen. No pathologically dilated loops of small bowel are seen. No gross free intraperitoneal air on AP portable supine view that does not include the entirety of the abdomen. No radio-opaque calculi or other significant radiographic abnormality are seen. IMPRESSION: Gaseous distension of large bowel throughout the abdomen suggesting ileus. Electronically Signed   By: Davina Poke D.O.   On: 01/10/2022 14:40    Cardiac Studies   Echo 01/07/22:  1. Suspicious for 14 x 5 mm thrombus (broad-based and fixed) at the level  of the inferior aspect of the apical left ventricular dyskinesia. Left  ventricular ejection fraction, by estimation, is 35 to 40%. The left  ventricle has mild to moderately  decreased function. The left ventricle demonstrates regional wall motion  abnormalities (see scoring diagram/findings for description). The left  ventricular internal cavity size was mildly dilated. There is moderate  concentric left ventricular  hypertrophy. Left ventricular diastolic parameters are consistent with  Grade II diastolic dysfunction (pseudonormalization). Elevated left atrial  pressure. There is moderate dyskinesis of the left ventricular, apical  segment. There is severe hypokinesis  of the left ventricular, mid-apical anterior wall and anteroseptal wall.   2. Right ventricular systolic function is normal. The right ventricular  size is normal.   3. Left atrial size was mild to moderately dilated.   4. The mitral valve is normal in structure. Mild mitral valve   regurgitation. No evidence of mitral stenosis.   5. The aortic valve is tricuspid. Aortic valve regurgitation is not  visualized. No aortic stenosis is present.   6. The inferior vena cava is normal in size with greater than 50%  respiratory variability, suggesting right atrial pressure of 3 mmHg.   LHC 01/07/22:   Ost LM to Mid LM lesion is 95% stenosed.   Mid LAD lesion is 100% stenosed.   2nd Diag lesion is 95% stenosed.   Diagnostic Dominance: Right   Patient Profile     71 y.o. male with diabetes, obesity, hypertension, stroke, PAF, hyperlipidemia, and OSA on CPAP admitted with NSTEMI and acute systolic and diastolic heart failure.  Found to have critical LM disease on cath.   Assessment & Plan    # NSTEMI: # Hyperlipidemia: # s/p CABG: Patient likely had an out of hospital MI that presented mostly as indigestion.  Echo now with acute systolic and diastolic heart failure.  He was found to have critical left main disease.  CABG on 3/7 (LIMA-->LAD, SVG-->OM1, SVG-->OM1).  Given his progressive coronary disease despite adequate lipid control, will lower his goal to 55.  Increased atorvastatin to 40 mg.  He will need repeat lipids and a CMP in 2 to 3 months.  Continue carvedilol.  Continue aspirin.  Consider adding clopidogrel x1 year for NSTEMI when stable per CT surgery.  Continue carvedilol.   #Acute systolic diastolic heart failure: Echo this admission with LVEF 35-40% down from 45% previously.  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  Now s/p CABG.  Entresto and spironolactone on hold 2/2 AKI. BP is high.  Increase carvedilol to 6.25mg  bid.  CVP 5 this AM.  # AKI:  Post-Op AKI improving today.  Holding Entresto and spironolactone.   # LV thrombus:  Noted on echo this admission.  He was started on Eliquis 3/9.  Given typically would favor warfarin for LV thrombus.  Per CT Surgery.  # Hypertension:  Increase carvedilol as above.  Holding Entresto and spironolactone as above.  #  Obesity:  Consider Ozempic.  Needs cardiac rehab at discharge.   # DM:  Will need Jardiance/Farxiga given new HF.      For questions or updates, please contact Pray Please consult [email or web address] for contact info under        Signed, Skeet Latch, MD  01/11/2022, 9:19 AM

## 2022-01-11 NOTE — Progress Notes (Signed)
Physical Therapy Treatment ?Patient Details ?Name: Raymond [name] ?MRN: [id number] ?DOB: 05-24-1951 ?Today's Date: 01/11/2022 ? ? ?History of Present Illness The pt is a 71 yo male presenting 3/4 with cough x2 days. upon work up, concern for new onset CHF. S/p L heart cath and coronary angiography 3/6. 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 ETT 3/7 - 3/8. PMH includes: CVA with R-sided deficits, HTN, HLD, DM II, OSA on CPAP, and depression. ? ?  ?PT Comments  ? ? Patient progressing well towards PT goals. Continues to report discomfort in abdomen, awaiting a BM. Improved ambulation distance with min guard assist and use of RW for support. SP02 dropped to mid 80s on RA with walking; donned 2L and able to maintain Sp02 >90% with activity. Min A needed to stand. Reviewed sternal precautions and provided handout. Will continue to follow and progress mobility as well as wean off 02 as able.  ?   ?Recommendations for follow up therapy are one component of a multi-disciplinary discharge planning process, led by the attending physician.  Recommendations may be updated based on patient status, additional functional criteria and insurance authorization. ? ?Follow Up Recommendations ? Home health PT ?  ?  ?Assistance Recommended at Discharge Intermittent Supervision/Assistance  ?Patient can return home with the following A little help with walking and/or transfers;A little help with bathing/dressing/bathroom;Assistance with cooking/housework;Help with stairs or ramp for entrance;Assist for transportation ?  ?Equipment Recommendations ? BSC/3in1;Rolling walker (2 wheels) (pending progress)  ?  ?Recommendations for Other Services   ? ? ?  ?Precautions / Restrictions Precautions ?Precautions: Fall;Sternal;Other (comment) ?Precaution Booklet Issued: Yes (comment) ?Precaution Comments: reviewed sternal precautions and "move in the tube."; watch 02 ?Restrictions ?Weight Bearing Restrictions: Yes ?Other Position/Activity Restrictions: sternal precautions  ?   ? ?Mobility ? Bed Mobility ?  ?  ?  ?  ?  ?  ?  ?General bed mobility comments: OOB in recliner at beginning and end of session ?  ? ?Transfers ?Overall transfer level: Needs assistance ?Equipment used: Rolling walker (2 wheels) ?Transfers: Sit to/from Stand ?Sit to Stand: Min assist ?  ?  ?  ?  ?  ?General transfer comment: MinA to power up to stand and steady at RW, cuing pt to hug pillow during transfer for sternal precautions. ?  ? ?Ambulation/Gait ?Ambulation/Gait assistance: Min guard ?Gait Distance (Feet): 200 Feet ?Assistive device: Rolling walker (2 wheels) ?Gait Pattern/deviations: Step-through pattern, Decreased stride length ?Gait velocity: decreased ?Gait velocity interpretation: <1.31 ft/sec, indicative of household ambulator ?  ?General Gait Details: Very slow, mostly steady gait with RW, 2/4 DOE. Sp02 dropped ti mid 80s on RA, donned 2L and able to maintain >90%. No LOB. ? ? ?Stairs ?  ?  ?  ?  ?  ? ? ?Wheelchair Mobility ?  ? ?Modified Rankin (Stroke Patients Only) ?  ? ? ?  ?Balance Overall balance assessment: Needs assistance ?Sitting-balance support: Feet supported, No upper extremity supported ?Sitting balance-Leahy Scale: Fair ?Sitting balance - Comments: Static no LOB, supervision for safety ?  ?Standing balance support: During functional activity, Bilateral upper extremity supported ?Standing balance-Leahy Scale: Fair ?Standing balance comment: Able to stand statically without UE support, benefits from UE support to mobilize ?  ?  ?  ?  ?  ?  ?  ?  ?  ?  ?  ?  ? ?  ?Cognition Arousal/Alertness: Awake/alert ?Behavior During Therapy: Flat affect ?Overall Cognitive Status: Impaired/Different from baseline ?Area of Impairment: Awareness, Problem solving, Safety/judgement, Memory ?  ?  ?  ?  ?  ?  ?  ?  ?  ?  ?  Memory: Decreased short-term memory, Decreased recall of precautions ?  ?Safety/Judgement: Decreased awareness of deficits ?Awareness: Emergent ?Problem Solving: Decreased initiation,  Requires verbal cues ?  ?  ?  ? ?  ?Exercises   ? ?  ?General Comments General comments (skin integrity, edema, etc.): Spouse present in room. Sp02 dropped to mid 80s on RA with activity, donned 2L and able to maintain Sp02 >90%. ?  ?  ? ?Pertinent Vitals/Pain Pain Assessment ?Pain Assessment: Faces ?Faces Pain Scale: Hurts a little bit ?Pain Location: sternum ?Pain Descriptors / Indicators: Discomfort, Operative site guarding ?Pain Intervention(s): Monitored during session, Repositioned  ? ? ?Home Living Family/patient expects to be discharged to:: Private residence ?Living Arrangements: Spouse/significant other ?Available Help at Discharge: Family;Available PRN/intermittently ?Type of Home: House ?Home Access: Stairs to enter ?Entrance Stairs-Rails: Left;Right ?Entrance Stairs-Number of Steps: 3 ?  ?Home Layout: One level ?Home Equipment: Gilmer Mor - single point ?Additional Comments: pt family has made him walking sticks, states no limitations in mobility other than endurance  ?  ?Prior Function    ?  ?  ?   ? ?PT Goals (current goals can now be found in the care plan section) Progress towards PT goals: Progressing toward goals ? ?  ?Frequency ? ? ? Min 3X/week ? ? ? ?  ?PT Plan Current plan remains appropriate  ? ? ?Co-evaluation   ?  ?  ?  ?  ? ?  ?AM-PAC PT "6 Clicks" Mobility   ?Outcome Measure ? Help needed turning from your back to your side while in a flat bed without using bedrails?: A Little ?Help needed moving from lying on your back to sitting on the side of a flat bed without using bedrails?: A Little ?Help needed moving to and from a bed to a chair (including a wheelchair)?: A Little ?Help needed standing up from a chair using your arms (e.g., wheelchair or bedside chair)?: A Little ?Help needed to walk in hospital room?: A Little ?Help needed climbing 3-5 steps with a railing? : A Little ?6 Click Score: 18 ? ?  ?End of Session Equipment Utilized During Treatment: Oxygen ?Activity Tolerance: Patient  tolerated treatment well ?Patient left: Other (comment);with family/visitor present (sitting on toilet) ?Nurse Communication: Mobility status ?PT Visit Diagnosis: Unsteadiness on feet (R26.81);Other abnormalities of gait and mobility (R26.89);Difficulty in walking, not elsewhere classified (R26.2);Muscle weakness (generalized) (M62.81) ?  ? ? ?Time: 8338-2505 ?PT Time Calculation (min) (ACUTE ONLY): 26 min ? ?Charges:  $Gait Training: 23-37 mins          ?          ? ?Vale Haven, PT, DPT ?Acute Rehabilitation Services ?Pager [phone] ?Office [phone] ? ? ? ? ? ?Blake Divine A Tage Feggins ?01/11/2022, 3:29 PM ? ?

## 2022-01-11 NOTE — Discharge Instructions (Signed)
Discharge Instructions:  1. You may shower, please wash incisions daily with soap and water and keep dry.  If you wish to cover wounds with dressing you may do so but please keep clean and change daily.  No tub baths or swimming until incisions have completely healed.  If your incisions become red or develop any drainage please call our office at [phone]  2. No Driving until cleared by Dr. Lucilla Lame office and you are no longer using narcotic pain medications  3. Monitor your weight daily.. Please use the same scale and weigh at same time... If you gain 5-10 lbs in 48 hours with associated lower extremity swelling, please contact our office at [phone]  4. Fever of 101.5 for at least 24 hours with no source, please contact our office at [phone]  5. Activity- up as tolerated, please walk at least 3 times per day.  Avoid strenuous activity, no lifting, pushing, or pulling with your arms over 8-10 lbs for a minimum of 6 weeks  6. If any questions or concerns arise, please do not hesitate to contact our office at [phone]

## 2022-01-11 NOTE — Progress Notes (Signed)
? ?   ?  [address] ?      [place] [address] ?            [phone]   ?              ?3 Days Post-Op ?Procedure(s) (LRB): ?CORONARY ARTERY BYPASS GRAFTING (CABG) TIMES THREE USING LEFT INTERNAL MAMMARY ARTERY AND ENDOSCOPICALLY HARVESTED BILATERAL GREATER SAPHENOUS VEINS (N/A) ?TRANSESOPHAGEAL ECHOCARDIOGRAM (TEE) (N/A) ?ENDOVEIN HARVEST OF GREATER SAPHENOUS VEIN (Right) ? ? ?Events: ?No events ?Passing gas ?_______________________________________________________________ ?Vitals: ?BP (!) 150/75   Pulse 72   Temp 98.9 ?F (37.2 ?C)   Resp (!) 24   Ht 5\' 5"  (1.651 m)   Wt 109.7 kg   SpO2 99%   BMI 40.25 kg/m?  ?Filed Weights  ? 01/09/22 0530 01/10/22 0600 01/11/22 0600  ?Weight: 111.4 kg 109 kg 109.7 kg  ? ? ? ?- Neuro: alert NAD ? ?- Cardiovascular: Sinus ? Drips: none.   ?CVP:  [1 mmHg-26 mmHg] 17 mmHg ? ?- Pulm: EWOB ? ? ?ABG ?   ?Component Value Date/Time  ? PHART 7.326 (L) 01/10/2022 1244  ? PCO2ART 46.9 01/10/2022 1244  ? PO2ART 107 01/10/2022 1244  ? HCO3 26.4 01/10/2022 2101  ? TCO2 26 01/10/2022 1244  ? ACIDBASEDEF 2.0 01/10/2022 1244  ? O2SAT 76.5 01/10/2022 2101  ? ? ?- Abd: ND ?- Extremity: warm ? ?2102Intake/Output   ?   03/09 0701 ?03/10 0700 03/10 0701 ?03/11 0700  ? P.O. 0   ? I.V. (mL/kg) 3 (0)   ? IV Piggyback 200   ? Total Intake(mL/kg) 203 (1.9)   ? Urine (mL/kg/hr) 670 (0.3)   ? Chest Tube    ? Total Output 670   ? Net -467   ?     ?  ? ? ?_______________________________________________________________ ?Labs: ?CBC Latest Ref Rng & Units 01/11/2022 01/10/2022 01/10/2022  ?WBC 4.0 - 10.5 K/uL 6.9 6.7 -  ?Hemoglobin 13.0 - 17.0 g/dL 03/12/2022) 10.1(L) 9.5(L)  ?Hematocrit 39.0 - 52.0 % 30.9(L) 31.1(L) 28.0(L)  ?Platelets 150 - 400 K/uL 114(L) 102(L) -  ? ?CMP Latest Ref Rng & Units 01/11/2022 01/10/2022 01/10/2022  ?Glucose 70 - 99 mg/dL 03/12/2022) 144(Y) -  ?BUN 8 - 23 mg/dL 185(U) 31(S) -  ?Creatinine 0.61 - 1.24 mg/dL 97(W) 2.63(Z) -  ?Sodium 135 - 145 mmol/L 136 138 137  ?Potassium 3.5 -  5.1 mmol/L 4.2 4.4 4.3  ?Chloride 98 - 111 mmol/L 105 104 -  ?CO2 22 - 32 mmol/L 25 25 -  ?Calcium 8.9 - 10.3 mg/dL 8.0(L) 8.2(L) -  ?Total Protein 6.5 - 8.1 g/dL - 5.5(L) -  ?Total Bilirubin 0.3 - 1.2 mg/dL - 0.6 -  ?Alkaline Phos 38 - 126 U/L - 23(L) -  ?AST 15 - 41 U/L - 17 -  ?ALT 0 - 44 U/L - 6 -  ? ? ?CXR: ?PV congestion ? ?_______________________________________________________________ ? ?Assessment and Plan: ?POD 3 s/p CABG.  Hx of LV thrombus, and CHF ? ?Neuro: pain controlled ?CV: switching to carvedilol.   ?Pulm: pulm hyg ?Renal: creat trending down ?GI: awaiting return of bowel function ?Heme: stable ?ID: afebrile ?Endo: SSI ?Dispo: will transfer to floor ? ? ?8.58(I Keiran Gaffey ?01/11/2022 7:46 AM ? ? ?

## 2022-01-11 NOTE — Evaluation (Signed)
Occupational Therapy Evaluation Patient Details Name: Raymond [name] MRN: [id number] DOB: June 04, 1951 Today's Date: 01/11/2022   History of Present Illness The pt is a 71 yo male presenting 3/4 with cough x2 days. upon work up, concern for new onset CHF. S/p L heart cath and coronary angiography 3/6. 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 ETT 3/7 - 3/8. PMH includes: CVA with R-sided deficits, HTN, HLD, DM II, OSA on CPAP, and depression.   Clinical Impression   Pt typically only needs assist for LB dressing, otherwise mod I for ADL/transfers with some residual deficits in RUE from previous CVA approx 5 years ago. Today Pt is min A for transfers with RW, able to maintain standing at sink for approx 10 min for grooming tasks, max A for LB ADL. Educated on sternal precautions and compensatory strategies for ADL "move in the tube" terminology. Pt did drop to 86% on RA with mobility with bad wave form - in static standing was reading at >90%. OT will continue to follow acutely and recommending HHOT post-acute to maximize safety and independence in ADL and functional transfers.       Recommendations for follow up therapy are one component of a multi-disciplinary discharge planning process, led by the attending physician.  Recommendations may be updated based on patient status, additional functional criteria and insurance authorization.   Follow Up Recommendations  Home health OT    Assistance Recommended at Discharge Frequent or constant Supervision/Assistance (initially)  Patient can return home with the following A little help with walking and/or transfers;A lot of help with bathing/dressing/bathroom;Assistance with cooking/housework;Direct supervision/assist for medications management;Direct supervision/assist for financial management;Assist for transportation;Help with stairs or ramp for entrance    Functional Status Assessment  Patient has had a recent decline in their functional status and demonstrates the ability  to make significant improvements in function in a reasonable and predictable amount of time.  Equipment Recommendations  BSC/3in1    Recommendations for Other Services PT consult     Precautions / Restrictions Precautions Precautions: Fall;Sternal Precaution Booklet Issued: No Precaution Comments: reviewed sternal precautions Restrictions Weight Bearing Restrictions: Yes (Sternal Precautions)      Mobility Bed Mobility               General bed mobility comments: OOB in recliner at beginning and end of session    Transfers Overall transfer level: Needs assistance Equipment used: Rolling walker (2 wheels) Transfers: Sit to/from Stand Sit to Stand: Min assist           General transfer comment: MinA to power up to stand and steady at RW, cuing pt to hug pillow during transfer for sternal precautions.      Balance Overall balance assessment: Needs assistance Sitting-balance support: No upper extremity supported Sitting balance-Leahy Scale: Fair Sitting balance - Comments: Static no LOB, supervision for safety   Standing balance support: During functional activity, Bilateral upper extremity supported, No upper extremity supported Standing balance-Leahy Scale: Fair Standing balance comment: Able to stand statically without UE support, benefits from UE support to mobilize                           ADL either performed or assessed with clinical judgement   ADL Overall ADL's : Needs assistance/impaired Eating/Feeding: Set up;Sitting   Grooming: Wash/dry hands;Wash/dry face;Oral care;Min guard;Standing;Cueing for sequencing;Cueing for compensatory techniques Grooming Details (indicate cue type and reason): sink level; educating on move in the tube sternal precautions Upper Body Bathing:  Moderate assistance;Sitting   Lower Body Bathing: Maximal assistance;Sitting/lateral leans Lower Body Bathing Details (indicate cue type and reason): may benefit from  long handle sponge Upper Body Dressing : Moderate assistance;Sitting   Lower Body Dressing: Maximal assistance;Sit to/from stand Lower Body Dressing Details (indicate cue type and reason): baseline Toilet Transfer: Min guard;Cueing for safety;Ambulation;Rolling walker (2 wheels) Toilet Transfer Details (indicate cue type and reason): vc for RW navigation Toileting- Clothing Manipulation and Hygiene: Maximal assistance;Sit to/from stand   Tub/ Shower Transfer: Min guard;Ambulation   Functional mobility during ADLs: Min guard;Rolling walker (2 wheels)       Vision Ability to See in Adequate Light: 0 Adequate Patient Visual Report: No change from baseline       Perception     Praxis      Pertinent Vitals/Pain Pain Assessment Pain Assessment: Faces Faces Pain Scale: Hurts little more Pain Location: sternum Pain Descriptors / Indicators: Discomfort, Grimacing, Operative site guarding Pain Intervention(s): Limited activity within patient's tolerance, Monitored during session, Repositioned     Hand Dominance Right   Extremity/Trunk Assessment Upper Extremity Assessment Upper Extremity Assessment: RUE deficits/detail RUE Deficits / Details: baseline tremor, some baseline deficits from previous CVA 5 years ago - however functional for opening toothbrush and unscrewing toothpaste       Cervical / Trunk Assessment Cervical / Trunk Assessment: Other exceptions Cervical / Trunk Exceptions: large body habitus   Communication Communication Communication: No difficulties   Cognition Arousal/Alertness: Awake/alert Behavior During Therapy: Flat affect Overall Cognitive Status: Impaired/Different from baseline Area of Impairment: Awareness, Problem solving, Safety/judgement, Memory                     Memory: Decreased short-term memory, Decreased recall of precautions (no recall of sternal precautions)   Safety/Judgement: Decreased awareness of deficits Awareness:  Emergent Problem Solving: Decreased initiation, Requires verbal cues General Comments: Pt required cues throughout standing grooming ADL for task completion     General Comments  off O2 for in room mobility and functional ADL. Dropped to 86% with poor pleth - no increased SOB during ambulation to bathroom. in standing with good pleth SpO2 >90%. Once seated for about 5 min Pt drooping to high 80's again and donned O2 again.    Exercises     Shoulder Instructions      Home Living Family/patient expects to be discharged to:: Private residence Living Arrangements: Spouse/significant other Available Help at Discharge: Family;Available PRN/intermittently Type of Home: House Home Access: Stairs to enter [employer] of Steps: 3 Entrance Stairs-Rails: Left;Right Home Layout: One level     Bathroom Shower/Tub: Chief Strategy Officer: Standard     Home Equipment: Cane - single point   Additional Comments: pt family has made him walking sticks, states no limitations in mobility other than endurance      Prior Functioning/Environment Prior Level of Function : Driving;Independent/Modified Independent             Mobility Comments: walking without AD, endurance difficulties. no falls ADLs Comments: unable to don socks/shoes at baseline        OT Problem List: Decreased range of motion;Decreased activity tolerance;Impaired balance (sitting and/or standing);Decreased cognition;Decreased safety awareness;Decreased knowledge of use of DME or AE;Decreased knowledge of precautions;Cardiopulmonary status limiting activity;Obesity;Pain      OT Treatment/Interventions: Self-care/ADL training;Energy conservation;DME and/or AE instruction;Therapeutic activities;Cognitive remediation/compensation;Patient/family education;Balance training    OT Goals(Current goals can be found in the care plan section) Acute Rehab OT Goals Patient Stated  Goal: get home, be  independent OT Goal Formulation: With patient/family Time For Goal Achievement: 01/25/22 Potential to Achieve Goals: Good ADL Goals Pt Will Perform Grooming: with modified independence;standing Pt Will Perform Upper Body Bathing: with modified independence;standing;with adaptive equipment Pt Will Perform Lower Body Bathing: with modified independence;with adaptive equipment;sit to/from stand Pt Will Perform Upper Body Dressing: with min guard assist;sitting Pt Will Transfer to Toilet: with supervision;ambulating Pt Will Perform Toileting - Clothing Manipulation and hygiene: with mod assist;sit to/from stand;with caregiver independent in assisting Additional ADL Goal #1: Pt will perform bed mobility at min A while maintaining sternal precautions  OT Frequency: Min 2X/week    Co-evaluation              AM-PAC OT "6 Clicks" Daily Activity     Outcome Measure Help from another person eating meals?: A Little Help from another person taking care of personal grooming?: A Little Help from another person toileting, which includes using toliet, bedpan, or urinal?: A Lot Help from another person bathing (including washing, rinsing, drying)?: A Lot Help from another person to put on and taking off regular upper body clothing?: A Lot Help from another person to put on and taking off regular lower body clothing?: A Lot 6 Click Score: 14   End of Session Equipment Utilized During Treatment: Rolling walker (2 wheels) Nurse Communication: Mobility status;Precautions  Activity Tolerance: Patient tolerated treatment well Patient left: in chair;with call bell/phone within reach;with family/visitor present  OT Visit Diagnosis: Unsteadiness on feet (R26.81);Muscle weakness (generalized) (M62.81);Other symptoms and signs involving cognitive function                Time: [id number] OT Time Calculation (min): 25 min Charges:  OT General Charges $OT Visit: 1 Visit OT Evaluation $OT Eval Moderate  Complexity: 1 Mod  Nyoka CowdenLaura H OTR/L Acute Rehabilitation Services Pager: [phone] Office: [phone]  Evern BioLaura J Daleigh Pollinger 01/11/2022, 1:43 PM

## 2022-01-12 LAB — BASIC METABOLIC PANEL
Anion gap: 8 (ref 5–15)
BUN: 27 mg/dL — ABNORMAL HIGH (ref 8–23)
CO2: 24 mmol/L (ref 22–32)
Calcium: 8.4 mg/dL — ABNORMAL LOW (ref 8.9–10.3)
Chloride: 107 mmol/L (ref 98–111)
Creatinine, Ser: 1.5 mg/dL — ABNORMAL HIGH (ref 0.61–1.24)
GFR, Estimated: 49 mL/min — ABNORMAL LOW (ref >60)
Glucose, Bld: 97 mg/dL (ref 70–99)
Potassium: 4.6 mmol/L (ref 3.5–5.1)
Sodium: 139 mmol/L (ref 135–145)

## 2022-01-12 LAB — GLUCOSE, CAPILLARY
Glucose-Capillary: 156 mg/dL — ABNORMAL HIGH (ref 70–99)
Glucose-Capillary: 164 mg/dL — ABNORMAL HIGH (ref 70–99)
Glucose-Capillary: 196 mg/dL — ABNORMAL HIGH (ref 70–99)
Glucose-Capillary: 89 mg/dL (ref 70–99)
Glucose-Capillary: 89 mg/dL (ref 70–99)
Glucose-Capillary: 95 mg/dL (ref 70–99)

## 2022-01-12 LAB — CBC
HCT: 30.5 % — ABNORMAL LOW (ref 39.0–52.0)
Hemoglobin: 10 g/dL — ABNORMAL LOW (ref 13.0–17.0)
MCH: 29.8 pg (ref 26.0–34.0)
MCHC: 32.8 g/dL (ref 30.0–36.0)
MCV: 90.8 fL (ref 80.0–100.0)
Platelets: 128 10*3/uL — ABNORMAL LOW (ref 150–400)
RBC: 3.36 MIL/uL — ABNORMAL LOW (ref 4.22–5.81)
RDW: 13.9 % (ref 11.5–15.5)
WBC: 6.1 10*3/uL (ref 4.0–10.5)
nRBC: 0 % (ref 0.0–0.2)

## 2022-01-12 MED ORDER — POTASSIUM CHLORIDE CRYS ER 20 MEQ PO TBCR
20.0000 meq | EXTENDED_RELEASE_TABLET | Freq: Every day | ORAL | Status: DC
  Administered 2022-01-12 – 2022-01-14 (×3): 20 meq via ORAL
  Filled 2022-01-12 (×3): qty 1

## 2022-01-12 MED ORDER — INSULIN ASPART 100 UNIT/ML IJ SOLN
0.0000 [IU] | Freq: Three times a day (TID) | INTRAMUSCULAR | Status: DC
  Administered 2022-01-12: 4 [IU] via SUBCUTANEOUS
  Administered 2022-01-12: 2 [IU] via SUBCUTANEOUS
  Administered 2022-01-13: 4 [IU] via SUBCUTANEOUS
  Administered 2022-01-13: 2 [IU] via SUBCUTANEOUS
  Administered 2022-01-13: 8 [IU] via SUBCUTANEOUS
  Administered 2022-01-14: 4 [IU] via SUBCUTANEOUS
  Administered 2022-01-14: 2 [IU] via SUBCUTANEOUS
  Administered 2022-01-14: 4 [IU] via SUBCUTANEOUS
  Administered 2022-01-15: 2 [IU] via SUBCUTANEOUS

## 2022-01-12 MED ORDER — POLYETHYLENE GLYCOL 3350 17 G PO PACK: 17.0000 g | PACK | Freq: Every day | ORAL | Status: DC

## 2022-01-12 MED ORDER — FUROSEMIDE 40 MG PO TABS
40.0000 mg | ORAL_TABLET | Freq: Every day | ORAL | Status: DC
  Administered 2022-01-12 – 2022-01-15 (×4): 40 mg via ORAL
  Filled 2022-01-12 (×4): qty 1

## 2022-01-12 MED ORDER — LOSARTAN POTASSIUM 25 MG PO TABS
25.0000 mg | ORAL_TABLET | Freq: Every day | ORAL | Status: DC
  Administered 2022-01-12: 25 mg via ORAL
  Filled 2022-01-12: qty 1

## 2022-01-12 NOTE — Progress Notes (Signed)
CARDIAC REHAB PHASE I  ? ?PRE:  Rate/Rhythm: 69 SR ? ?BP:  Supine: 156/69  Sitting:   Standing:  ?  SaO2: 92 RA ? ?MODE:  Ambulation: 200 ft  ? ?POST:  Rate/Rhythm: 91 SAR ? ?BP:  Supine: 173/71  Sitting:   Standing:  ?  SaO2: 94 RA ?1105-1210 ?Assisted X1 and used walker to ambulate. Gait steady but slow. Pt walked 200 feet without c/o. His pur wick started leaking so turn around and went back to room.Pt leak urine and has to wear depends. Pt back to bed after walk with family present and call light in reach. BP after walk 173/71. Completed discharge education with wife and daughter. Discussed sternal precautions, restrictions, heart healthy diabetic diet,exercise guidelines, use of IS and Outpt. CRP. Will send referral to [facility] per family request. Encouraged pt and family to watch Recovery from heart surgery video. ? ?Rodney Langton RN ?01/12/2022 ?12:14 PM ? ? ?

## 2022-01-12 NOTE — Progress Notes (Addendum)
? ?   ?  [address] ?      [name] [address] ?            [phone]   ? ?  ?4 Days Post-Op Procedure(s) (LRB): ?CORONARY ARTERY BYPASS GRAFTING (CABG) TIMES THREE USING LEFT INTERNAL MAMMARY ARTERY AND ENDOSCOPICALLY HARVESTED BILATERAL GREATER SAPHENOUS VEINS (N/A) ?TRANSESOPHAGEAL ECHOCARDIOGRAM (TEE) (N/A) ?ENDOVEIN HARVEST OF GREATER SAPHENOUS VEIN (Right) ? ?Subjective: ? ?Patient just waking up.  He has no complaints.  States he is doing well denies N/V.   No BM yet, passing gas ? ?Objective: ?Vital signs in last 24 hours: ?Temp:  [97.5 ?F (36.4 ?C)-99.1 ?F (37.3 ?C)] 97.5 ?F (36.4 ?C) (03/11 0404) ?Pulse Rate:  [54-70] 57 (03/11 0404) ?Cardiac Rhythm: Sinus bradycardia (03/10 1930) ?Resp:  [13-24] 16 (03/11 0404) ?BP: (115-155)/(61-97) 147/70 (03/11 0404) ?SpO2:  [90 %-100 %] 90 % (03/11 0404) ? ?Intake/Output from previous day: ?03/10 0701 - 03/11 0700 ?In: 180 [P.O.:180] ?Out: 50 [Urine:50] ? ?General appearance: alert, cooperative, and no distress ?Heart: regular rate and rhythm ?Lungs: clear to auscultation bilaterally ?Abdomen: soft, non-tender; bowel sounds normal; no masses,  no organomegaly ?Extremities: edema trace ?Wound: clean and dry ? ?Lab Results: ?Recent Labs  ?  01/11/22 ?0452 01/12/22 ?0103  ?WBC 6.9 6.1  ?HGB 9.9* 10.0*  ?HCT 30.9* 30.5*  ?PLT 114* 128*  ? ?BMET:  ?Recent Labs  ?  01/11/22 ?0452 01/12/22 ?0103  ?NA 136 139  ?K 4.2 4.6  ?CL 105 107  ?CO2 25 24  ?GLUCOSE 118* 97  ?BUN 25* 27*  ?CREATININE 1.64* 1.50*  ?CALCIUM 8.0* 8.4*  ?  ?PT/INR: No results for input(s): LABPROT, INR in the last 72 hours. ?ABG ?   ?Component Value Date/Time  ? PHART 7.326 (L) 01/10/2022 1244  ? HCO3 26.4 01/10/2022 2101  ? TCO2 26 01/10/2022 1244  ? ACIDBASEDEF 2.0 01/10/2022 1244  ? O2SAT 76.5 01/10/2022 2101  ? ?CBG (last 3)  ?Recent Labs  ?  01/11/22 ?1934 01/12/22 ?7096 01/12/22 ?0407  ?GLUCAP 93 89 95  ? ? ?Assessment/Plan: ?S/P Procedure(s) (LRB): ?CORONARY ARTERY BYPASS GRAFTING  (CABG) TIMES THREE USING LEFT INTERNAL MAMMARY ARTERY AND ENDOSCOPICALLY HARVESTED BILATERAL GREATER SAPHENOUS VEINS (N/A) ?TRANSESOPHAGEAL ECHOCARDIOGRAM (TEE) (N/A) ?ENDOVEIN HARVEST OF GREATER SAPHENOUS VEIN (Right) ? ?CV- NSR, + HTN- will continue Coreg, restart home Cozaar at reduced dose ?Pulm- on CPAP nightly, off oxygen, continue IS ?Renal- creatinine is trending down to 1.50, will start Lasix, potassium for volume overloaded state ?Expected post operative blood loss anemia, mild Hgb at 10.0 ?DM- sugars controlled, continue SSIP ?Dispo- patient stable, will add Cozaar for HTN, start diuretics for volume overloaded state, no BM yet... will add Miralax daily, home health orders placed ? ? LOS: 7 days  ? ? ?Lowella Dandy, PA-C ?01/12/2022 ? ? ?Awaiting return of bowel function ?Creat stable ? ?Eliezer Lofts Shelton Soler ? ? ?

## 2022-01-12 NOTE — Progress Notes (Signed)
Pt placed on CPAP for the night. Tol well ?

## 2022-01-13 LAB — GLUCOSE, CAPILLARY
Glucose-Capillary: 118 mg/dL — ABNORMAL HIGH (ref 70–99)
Glucose-Capillary: 132 mg/dL — ABNORMAL HIGH (ref 70–99)
Glucose-Capillary: 134 mg/dL — ABNORMAL HIGH (ref 70–99)
Glucose-Capillary: 189 mg/dL — ABNORMAL HIGH (ref 70–99)
Glucose-Capillary: 223 mg/dL — ABNORMAL HIGH (ref 70–99)

## 2022-01-13 LAB — BASIC METABOLIC PANEL
Anion gap: 9 (ref 5–15)
BUN: 24 mg/dL — ABNORMAL HIGH (ref 8–23)
CO2: 26 mmol/L (ref 22–32)
Calcium: 8.5 mg/dL — ABNORMAL LOW (ref 8.9–10.3)
Chloride: 103 mmol/L (ref 98–111)
Creatinine, Ser: 1.35 mg/dL — ABNORMAL HIGH (ref 0.61–1.24)
GFR, Estimated: 56 mL/min — ABNORMAL LOW (ref >60)
Glucose, Bld: 127 mg/dL — ABNORMAL HIGH (ref 70–99)
Potassium: 3.9 mmol/L (ref 3.5–5.1)
Sodium: 138 mmol/L (ref 135–145)

## 2022-01-13 MED ORDER — LACTULOSE 10 GM/15ML PO SOLN
20.0000 g | Freq: Once | ORAL | Status: AC
Start: 2022-01-13 — End: 2022-01-13
  Administered 2022-01-13: 20 g via ORAL
  Filled 2022-01-13: qty 30

## 2022-01-13 MED ORDER — LOSARTAN POTASSIUM 50 MG PO TABS
50.0000 mg | ORAL_TABLET | Freq: Every day | ORAL | Status: DC
  Administered 2022-01-13: 50 mg via ORAL
  Filled 2022-01-13: qty 1

## 2022-01-13 MED ORDER — ASPIRIN EC 81 MG PO TBEC
81.0000 mg | DELAYED_RELEASE_TABLET | Freq: Every day | ORAL | Status: DC
  Administered 2022-01-13 – 2022-01-15 (×3): 81 mg via ORAL
  Filled 2022-01-13 (×3): qty 1

## 2022-01-13 MED ORDER — ASPIRIN 81 MG PO CHEW
81.0000 mg | CHEWABLE_TABLET | Freq: Every day | ORAL | Status: DC
  Filled 2022-01-13 (×2): qty 1

## 2022-01-13 NOTE — Progress Notes (Addendum)
? ?   ?  [address] ?      [name] [address] ?            [phone]   ? ?  ?5 Days Post-Op Procedure(s) (LRB): ?CORONARY ARTERY BYPASS GRAFTING (CABG) TIMES THREE USING LEFT INTERNAL MAMMARY ARTERY AND ENDOSCOPICALLY HARVESTED BILATERAL GREATER SAPHENOUS VEINS (N/A) ?TRANSESOPHAGEAL ECHOCARDIOGRAM (TEE) (N/A) ?ENDOVEIN HARVEST OF GREATER SAPHENOUS VEIN (Right) ? ?Subjective: ? ?Patient without complaints.  He has still not moved his bowels, but he is passing gas.  He denies N/V ? ?Objective: ?Vital signs in last 24 hours: ?Temp:  [97.6 ?F (36.4 ?C)-99.5 ?F (37.5 ?C)] 97.9 ?F (36.6 ?C) (03/12 0420) ?Pulse Rate:  [54-78] 54 (03/12 0420) ?Cardiac Rhythm: Normal sinus rhythm (03/11 1923) ?Resp:  [15-21] 18 (03/12 0420) ?BP: (131-166)/(62-74) 166/74 (03/12 0420) ?SpO2:  [92 %-98 %] 94 % (03/12 0420) ?Weight:  [108.5 kg] 108.5 kg (03/12 0420) ? ?Intake/Output from previous day: ?03/11 0701 - 03/12 0700 ?In: -  ?Out: 900 [Urine:900] ? ?General appearance: alert, cooperative, and no distress ?Heart: regular rate and rhythm ?Lungs: clear to auscultation bilaterally ?Abdomen: soft, distended, + BS, mild tenderness of palpitation ?Extremities: edema trace ?Wound: clean and dry ? ?Lab Results: ?Recent Labs  ?  01/11/22 ?0452 01/12/22 ?0103  ?WBC 6.9 6.1  ?HGB 9.9* 10.0*  ?HCT 30.9* 30.5*  ?PLT 114* 128*  ? ?BMET:  ?Recent Labs  ?  01/12/22 ?0103 01/13/22 ?0308  ?NA 139 138  ?K 4.6 3.9  ?CL 107 103  ?CO2 24 26  ?GLUCOSE 97 127*  ?BUN 27* 24*  ?CREATININE 1.50* 1.35*  ?CALCIUM 8.4* 8.5*  ?  ?PT/INR: No results for input(s): LABPROT, INR in the last 72 hours. ?ABG ?   ?Component Value Date/Time  ? PHART 7.326 (L) 01/10/2022 1244  ? HCO3 26.4 01/10/2022 2101  ? TCO2 26 01/10/2022 1244  ? ACIDBASEDEF 2.0 01/10/2022 1244  ? O2SAT 76.5 01/10/2022 2101  ? ?CBG (last 3)  ?Recent Labs  ?  01/12/22 ?2103 01/13/22 ?0092 01/13/22 ?0420  ?GLUCAP 196* 132* 118*  ? ? ?Assessment/Plan: ?S/P Procedure(s) (LRB): ?CORONARY  ARTERY BYPASS GRAFTING (CABG) TIMES THREE USING LEFT INTERNAL MAMMARY ARTERY AND ENDOSCOPICALLY HARVESTED BILATERAL GREATER SAPHENOUS VEINS (N/A) ?TRANSESOPHAGEAL ECHOCARDIOGRAM (TEE) (N/A) ?ENDOVEIN HARVEST OF GREATER SAPHENOUS VEIN (Right) ? ?CV- NSR, + HTN- continue Coreg, will increase Cozaar to 50 mg daily, continue Eliquis ?Pulm- on CPAP nightly, weaning oxygen as tolerated, continue IS ?Renal- creatinine improving down to 1.3, continue Lasix, potassium ?GI- constipation- will try Lactulose today, on CLD will not advance until bowels have moved ?DM- sugars remain controlled, will resume Metformin at discharge ?Dispo- patient doing well, increase Cozaar for additional BP control, biggest issues is bowel function currently will give Lactulose today, continue diuretics, wean oxygen as tolerated... if patient remains stable and his bowels move I suspect discharge in the next 24-48 hours ? ? LOS: 8 days  ? ?Raymond Dandy, PA-C ?01/13/2022 ? ?BM this morning ?Will advance diet ?Home tomorrow ? ?Raymond [name] ? ?

## 2022-01-13 NOTE — Progress Notes (Signed)
? ?  Progress Note ? ?Patient Name: Raymond [name] ?Date of Encounter: 01/13/2022 ? ?Pt in bathroom when I came by to examine.   Spoke with wife ?Note plans for possible discharge ?Will make plans for outpt appt in cardiology after discharge   Our office will contact pt    ? ?Signed, ?Dietrich Pates, MD  ?01/13/2022, 12:00 PM   ? ?

## 2022-01-14 ENCOUNTER — Other Ambulatory Visit (HOSPITAL_COMMUNITY): Payer: Self-pay

## 2022-01-14 DIAGNOSIS — I25118 Atherosclerotic heart disease of native coronary artery with other forms of angina pectoris: Secondary | ICD-10-CM

## 2022-01-14 DIAGNOSIS — I5020 Unspecified systolic (congestive) heart failure: Secondary | ICD-10-CM

## 2022-01-14 LAB — GLUCOSE, CAPILLARY
Glucose-Capillary: 149 mg/dL — ABNORMAL HIGH (ref 70–99)
Glucose-Capillary: 161 mg/dL — ABNORMAL HIGH (ref 70–99)
Glucose-Capillary: 165 mg/dL — ABNORMAL HIGH (ref 70–99)

## 2022-01-14 MED ORDER — ATORVASTATIN CALCIUM 40 MG PO TABS: 40.0000 mg | ORAL_TABLET | Freq: Every day | ORAL | 3 refills | Status: DC

## 2022-01-14 MED ORDER — FUROSEMIDE 40 MG PO TABS: 40.0000 mg | ORAL_TABLET | Freq: Every day | ORAL | 0 refills | Status: DC

## 2022-01-14 MED ORDER — LOSARTAN POTASSIUM 50 MG PO TABS
100.0000 mg | ORAL_TABLET | Freq: Every day | ORAL | Status: DC
  Administered 2022-01-14: 100 mg via ORAL
  Filled 2022-01-14: qty 2

## 2022-01-14 MED ORDER — APIXABAN 5 MG PO TABS: 5.0000 mg | ORAL_TABLET | Freq: Two times a day (BID) | ORAL | 3 refills | Status: DC

## 2022-01-14 MED ORDER — CARVEDILOL 6.25 MG PO TABS: 6.2500 mg | ORAL_TABLET | Freq: Two times a day (BID) | ORAL | 3 refills | Status: DC

## 2022-01-14 MED ORDER — POTASSIUM CHLORIDE CRYS ER 20 MEQ PO TBCR
20.0000 meq | EXTENDED_RELEASE_TABLET | Freq: Every day | ORAL | 0 refills | Status: DC
Start: 2022-01-14 — End: 2022-01-17

## 2022-01-14 MED ORDER — OXYCODONE HCL 5 MG PO TABS: 5.0000 mg | ORAL_TABLET | Freq: Four times a day (QID) | ORAL | 0 refills | Status: DC | PRN

## 2022-01-14 MED ORDER — HYDRALAZINE HCL 20 MG/ML IJ SOLN
10.0000 mg | Freq: Three times a day (TID) | INTRAMUSCULAR | Status: DC | PRN
  Administered 2022-01-14: 10 mg via INTRAVENOUS
  Filled 2022-01-14: qty 1

## 2022-01-14 MED ORDER — ASPIRIN 81 MG PO TBEC: 81.0000 mg | DELAYED_RELEASE_TABLET | Freq: Every day | ORAL | 11 refills | Status: AC

## 2022-01-14 MED ORDER — HYDRALAZINE HCL 20 MG/ML IJ SOLN: 5.0000 mg | Freq: Three times a day (TID) | INTRAMUSCULAR | Status: DC | PRN

## 2022-01-14 MED ORDER — SACUBITRIL-VALSARTAN 49-51 MG PO TABS
1.0000 | ORAL_TABLET | Freq: Two times a day (BID) | ORAL | Status: DC
  Administered 2022-01-14 – 2022-01-15 (×2): 1 via ORAL
  Filled 2022-01-14 (×2): qty 1

## 2022-01-14 NOTE — Progress Notes (Signed)
CARDIAC REHAB PHASE I  ? ?PRE:  Rate/Rhythm: 61 SR ? ?BP:  Sitting: 174/86     ? ?SaO2: 96 RA ? ?MODE:  Ambulation: 450 ft  ? ?POST:  Rate/Rhythm: 65 SR ? ?BP:  Sitting: 170/78 ? ?  SaO2: 96 RA ? ? ?Pt ambulated 410ft in hallway standby assist with front wheel walker. Pt states some SOB and hip pain, but denies CP or dizziness. Pt requesting walker and 3-in-1. Reinforced d/c education with pt and wife. Pt educated on site care and monitoring incision daily. Encouraged continued IS use, walks, and sternal precautions. Reviewed restrictions and exercise guidelines. Referred to [facility] [place]. ? ?[id number] ?Reynold Bowen, RN BSN ?01/14/2022 ?2:26 PM ? ?

## 2022-01-14 NOTE — Progress Notes (Addendum)
? ?   ?  [address] ?      [place] [address] ?            [phone]   ? ?  ? ? ?6 Days Post-Op Procedure(s) (LRB): ?CORONARY ARTERY BYPASS GRAFTING (CABG) TIMES THREE USING LEFT INTERNAL MAMMARY ARTERY AND ENDOSCOPICALLY HARVESTED BILATERAL GREATER SAPHENOUS VEINS (N/A) ?TRANSESOPHAGEAL ECHOCARDIOGRAM (TEE) (N/A) ?ENDOVEIN HARVEST OF GREATER SAPHENOUS VEIN (Right) ? ?Subjective: ?Patients denies nausea or abdominal pain. He has had bowel movements. ? ?Objective: ?Vital signs in last 24 hours: ?Temp:  [97.7 ?F (36.5 ?C)-98.6 ?F (37 ?C)] 98.6 ?F (37 ?C) (03/13 6803) ?Pulse Rate:  [57-76] 57 (03/13 0648) ?Cardiac Rhythm: Normal sinus rhythm (03/12 1903) ?Resp:  [16-25] 19 (03/13 2122) ?BP: (155-186)/(69-79) 174/79 (03/13 4825) ?SpO2:  [94 %-100 %] 97 % (03/13 0648) ?Weight:  [110.6 kg] 110.6 kg (03/13 0648) ? ?Pre op weight 105 kg ?Current Weight  ?01/14/22 110.6 kg  ? ?  ? ?Intake/Output from previous day: ?03/12 0701 - 03/13 0700 ?In: 720 [P.O.:720] ?Out: 1300 [Urine:1300] ? ? ?Physical Exam: ? ?Cardiovascular: Slightly bradycardic ?Pulmonary: Clear to auscultation bilaterally ?Abdomen: Soft, non tender, bowel sounds present. ?Extremities: Mild bilateral lower extremity edema. ?Wounds: Clean and dry.  No erythema or signs of infection. ? ?Lab Results: ?CBC: ?Recent Labs  ?  01/12/22 ?0103  ?WBC 6.1  ?HGB 10.0*  ?HCT 30.5*  ?PLT 128*  ? ?BMET:  ?Recent Labs  ?  01/12/22 ?0103 01/13/22 ?0308  ?NA 139 138  ?K 4.6 3.9  ?CL 107 103  ?CO2 24 26  ?GLUCOSE 97 127*  ?BUN 27* 24*  ?CREATININE 1.50* 1.35*  ?CALCIUM 8.4* 8.5*  ?  ?PT/INR:  ?Lab Results  ?Component Value Date  ? INR 1.4 (H) 01/08/2022  ? INR 1.1 01/08/2022  ? INR 0.98 07/10/2017  ? ?ABG:  ?INR: ?Will add last result for INR, ABG once components are confirmed ?Will add last 4 CBG results once components are confirmed ? ?Assessment/Plan: ? ?1. CV - SB with HR in the high 50's and hypertensive. On Losartan 50 mg daily, Carvedilol 6.25 mg bid,  and Apixaban (LV thrombus). Will increase Losartan for better BP control (was on 100 mg daily prior to surgery). ?2.  Pulmonary - On room air. History of OSA-on CPAP at night. Encourage incentive spirometer. ?3. Volume Overload - On Lasix 40 mg daily ?4.  Expected post op acute blood loss anemia - Last H and H stable at 10 and 30.5 ?5. DM-CBGs 189/223/134. Pre op HGA1C 5.2. Will restart Metformin at discharge. ?6. Thrombocytopenia-Last platelets up to 128,000 ?7. Possible discharge later today vs am ? ?Myca Perno M ZimmermanPA-C ?7:00 AM ?  ?

## 2022-01-14 NOTE — Progress Notes (Signed)
BP elevated ?HR low cannot increase Coreg ?EF 35-40% ?Continue Lasix/K ?Change losartan to Entresto mid dose and can increase to high dose as outpatient ? ?Charlton Haws MD [facility] ?

## 2022-01-14 NOTE — Care Management Important Message (Signed)
Important Message ? ?Patient Details  ?Name: Raymond [name] ?MRN: [id number] ?Date of Birth: 04/14/1951 ? ? ?Medicare Important Message Given:  Yes ? ? ? ? ?[name] ?01/14/2022, 11:52 AM ?

## 2022-01-14 NOTE — Progress Notes (Signed)
Heart Failure Nurse Navigator Progress Note ? ?PAtient in restroom, spoke with wife and left appointment TOC card on bedside table. Appointment schedule for Monday, January 21, 2022 @ 3pm.  ? ?Earnestine Leys, BSN, RN ?Heart Failure Nurse Navigator ?[phone]  ?

## 2022-01-14 NOTE — Progress Notes (Signed)
Nutrition Brief Note ? ?Patient identified on the Malnutrition Screening Tool (MST) Report. Patient reports recent poor intake at home r/t poor appetite. Since admission, he has been eating well. Nutrition focused physical exam completed.  No muscle or subcutaneous fat depletion noticed. No recent weight loss noted.  ? ?Wt Readings from Last 5 Encounters:  ?01/14/22 110.6 kg  ?06/27/20 101.6 kg  ?06/24/19 104.1 kg  ?11/09/18 106.6 kg  ?08/04/18 106.6 kg  ? ?Body mass index is 40.58 kg/m?Marland Kitchen Patient meets criteria for morbid obesity based on current BMI.  ? ?Current diet order is carb modified heart healthy, patient is consuming approximately 75-100% of meals at this time. Labs and medications reviewed.  ? ?Patient states he has never been on a low sodium diet in the past.  ?RD provided "Low Sodium Nutrition Therapy" handout from the [employer]. Reviewed patient's dietary recall. Provided examples on ways to decrease sodium intake in diet. Discouraged intake of processed foods and use of salt shaker. Encouraged fresh fruits and vegetables as well as whole grain sources of carbohydrates to maximize fiber intake.  ? ?RD discussed why it is important for patient to adhere to diet recommendations, and emphasized the role of fluids, foods to avoid, and importance of weighing self daily. Teach back method used. ? ?Expect good compliance. ? ?No further nutrition interventions warranted at this time. If nutrition issues arise, please consult RD.  ? ?Lucas Mallow RD, LDN, CNSC ?Please refer to Amion for contact information.                                                       ? ? ?

## 2022-01-14 NOTE — Progress Notes (Signed)
Mobility Specialist Progress Note ? ? 01/14/22 1116  ?Mobility  ?Activity Ambulated with assistance in hallway  ?Level of Assistance Standby assist, set-up cues, supervision of patient - no hands on  ?Assistive Device Four wheel walker  ?Distance Ambulated (ft) 250 ft  ?Activity Response Tolerated well  ?$Mobility charge 1 Mobility  ? ?Received in chair having no complaints and agreeable. Ambulated w/o fault and SpO2 ranging from 91% - 98% while on RA. Returned back to chair w/ risen BP but asx. Left call bell by side and RN notified.  ? ?Pre Mobility: 157/69 BP, 100% SpO2 ?During Mobility: 92% SpO2 ?Post Mobility: 169/76 BP, 96% SpO2 ? ?Raymond [name] ?Mobility Specialist ?Phone Number [phone] ? ?

## 2022-01-14 NOTE — Progress Notes (Signed)
Physical Therapy Treatment ?Patient Details ?Name: Raymond [name] ?MRN: [id number] ?DOB: 1951/04/25 ?Today's Date: 01/14/2022 ? ? ?History of Present Illness The pt is a 71 yo male presenting 3/4 with cough x2 days. upon work up, concern for new onset CHF. S/p L heart cath and coronary angiography 3/6. 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 ETT 3/7 - 3/8. PMH includes: CVA with R-sided deficits, HTN, HLD, DM II, OSA on CPAP, and depression. ? ?  ?PT Comments  ? ? Patient progressing well towards PT goals. Continues to require assist with bed mobility and cues to adhere to sternal precautions during functional mobility. Session focused on stair training with rail for support. Required 1 seated rest break during ambulation due to fatigue. Sp02 remained in 90s on RA throughout. Wife present during session. Pt eager to return home. Will follow if still in the hospital to improve overall endurance and functional mobility while following precautions. ?   ?Recommendations for follow up therapy are one component of a multi-disciplinary discharge planning process, led by the attending physician.  Recommendations may be updated based on patient status, additional functional criteria and insurance authorization. ? ?Follow Up Recommendations ? Home health PT ?  ?  ?Assistance Recommended at Discharge Intermittent Supervision/Assistance  ?Patient can return home with the following A little help with walking and/or transfers;A little help with bathing/dressing/bathroom;Assistance with cooking/housework;Help with stairs or ramp for entrance;Assist for transportation ?  ?Equipment Recommendations ? BSC/3in1;Rolling walker (2 wheels)  ?  ?Recommendations for Other Services   ? ? ?  ?Precautions / Restrictions Precautions ?Precautions: Fall;Sternal;Other (comment) ?Precaution Booklet Issued: Yes (comment) ?Precaution Comments: reviewed sternal precautions and "move in the tube."; watch 02 ?Restrictions ?Weight Bearing Restrictions: Yes ?Other  Position/Activity Restrictions: sternal precautions  ?  ? ?Mobility ? Bed Mobility ?Overal bed mobility: Needs Assistance ?Bed Mobility: Rolling, Sidelying to Sit ?Rolling: Min assist ?Sidelying to sit: Min assist, HOB elevated ?  ?  ?  ?General bed mobility comments: Cues for technique and sequencing as pt wanting to pull on rails to get to seated position. Assist to scoot hips forward. ?  ? ?Transfers ?Overall transfer level: Needs assistance ?Equipment used: Rolling walker (2 wheels) ?Transfers: Sit to/from Stand ?Sit to Stand: Min guard, Min assist ?  ?  ?  ?  ?  ?General transfer comment: Min guard for safety. Stood from [employer], from toilet x1, from rollator seat x1 Min A needed at times for safety. ?  ? ?Ambulation/Gait ?Ambulation/Gait assistance: Min guard ?Gait Distance (Feet): 200 Feet (x2 bouts) ?Assistive device: Rollator (4 wheels), Rolling walker (2 wheels) ?Gait Pattern/deviations: Step-through pattern, Decreased stride length ?Gait velocity: improved ?Gait velocity interpretation: 1.31 - 2.62 ft/sec, indicative of limited community ambulator ?  ?General Gait Details: SLow, steady gait with rollator, manages brakes well. 1 seated rest break. Sp02 remained >90% on RA. 2/4 DOE. ? ? ?Stairs ?Stairs: Yes ?Stairs assistance: Min guard ?Stair Management: Step to pattern, Forwards, One rail Left ?Number of Stairs: 3 ?General stair comments: Cues for technique/safety. ? ? ?Wheelchair Mobility ?  ? ?Modified Rankin (Stroke Patients Only) ?  ? ? ?  ?Balance Overall balance assessment: Needs assistance ?Sitting-balance support: Feet supported, No upper extremity supported ?Sitting balance-Leahy Scale: Good ?  ?  ?Standing balance support: During functional activity ?Standing balance-Leahy Scale: Fair ?Standing balance comment: Can walk short distances without RW however does better with UE support, ?  ?  ?  ?  ?  ?  ?  ?  ?  ?  ?  ?  ? ?  ?  Cognition Arousal/Alertness: Awake/alert ?Behavior During Therapy: Flat  affect ?Overall Cognitive Status: Impaired/Different from baseline ?Area of Impairment: Memory, Awareness ?  ?  ?  ?  ?  ?  ?  ?  ?  ?  ?Memory: Decreased short-term memory, Decreased recall of precautions ?  ?Safety/Judgement: Decreased awareness of deficits ?Awareness: Emergent ?Problem Solving: Requires verbal cues ?General Comments: Requires cues to adhere to sternal precautions throughout functional mobility and for self monitoring for activity. ?  ?  ? ?  ?Exercises   ? ?  ?General Comments General comments (skin integrity, edema, etc.): Wife -present during session. ?  ?  ? ?Pertinent Vitals/Pain Pain Assessment ?Pain Assessment: No/denies pain  ? ? ?Home Living   ?  ?  ?  ?  ?  ?  ?  ?  ?  ?   ?  ?Prior Function    ?  ?  ?   ? ?PT Goals (current goals can now be found in the care plan section) Progress towards PT goals: Progressing toward goals ? ?  ?Frequency ? ? ? Min 3X/week ? ? ? ?  ?PT Plan Current plan remains appropriate  ? ? ?Co-evaluation   ?  ?  ?  ?  ? ?  ?AM-PAC PT "6 Clicks" Mobility   ?Outcome Measure ? Help needed turning from your back to your side while in a flat bed without using bedrails?: A Little ?Help needed moving from lying on your back to sitting on the side of a flat bed without using bedrails?: A Little ?Help needed moving to and from a bed to a chair (including a wheelchair)?: A Little ?Help needed standing up from a chair using your arms (e.g., wheelchair or bedside chair)?: A Little ?Help needed to walk in hospital room?: A Little ?Help needed climbing 3-5 steps with a railing? : A Little ?6 Click Score: 18 ? ?  ?End of Session Equipment Utilized During Treatment: Gait belt ?Activity Tolerance: Patient tolerated treatment well ?Patient left: in chair;with call bell/phone within reach;with family/visitor present;with nursing/sitter in room ?Nurse Communication: Mobility status ?PT Visit Diagnosis: Unsteadiness on feet (R26.81);Other abnormalities of gait and mobility  (R26.89);Difficulty in walking, not elsewhere classified (R26.2);Muscle weakness (generalized) (M62.81) ?  ? ? ?Time: [phone] ?PT Time Calculation (min) (ACUTE ONLY): 34 min ? ?Charges:  $Gait Training: 8-22 mins ?$Therapeutic Activity: 8-22 mins          ?          ? ?Vale Haven, PT, DPT ?Acute Rehabilitation Services ?Pager [phone] ?Office [phone] ? ? ? ? ? ?Blake Divine A Vaiden Adames ?01/14/2022, 9:55 AM ? ?

## 2022-01-14 NOTE — Progress Notes (Signed)
Occupational Therapy Treatment ?Patient Details ?Name: Raymond [name] ?MRN: [id number] ?DOB: May 20, 1951 ?Today's Date: 01/14/2022 ? ? ?History of present illness The pt is a 71 yo male presenting 3/4 with cough x2 days. upon work up, concern for new onset CHF. S/p L heart cath and coronary angiography 3/6. 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 ETT 3/7 - 3/8. PMH includes: CVA with R-sided deficits, HTN, HLD, DM II, OSA on CPAP, and depression. ?  ?OT comments ? Pt is progressing towards OT goals. Limited mobility to short in room due to elevated BP (RN providing medication at beginning of session) transfers performed at min guard level with cues for safety/hand placement and sternal precautions. Pt required mod verbal cues for sternal precautions during ADL. Session truly focused on AE for LB bathing/dressing of which OT demoed for patient, and patient performed teach back. OT will continue to follow acutely and Pt POC remains appropriate at this time. Of all the AE Pt was most interested and excited by long handle shoe horn.  ? ?Recommendations for follow up therapy are one component of a multi-disciplinary discharge planning process, led by the attending physician.  Recommendations may be updated based on patient status, additional functional criteria and insurance authorization. ?   ?Follow Up Recommendations ? Home health OT  ?  ?Assistance Recommended at Discharge Frequent or constant Supervision/Assistance (initially)  ?Patient can return home with the following ? A little help with walking and/or transfers;A lot of help with bathing/dressing/bathroom;Assistance with cooking/housework;Direct supervision/assist for medications management;Direct supervision/assist for financial management;Assist for transportation;Help with stairs or ramp for entrance ?  ?Equipment Recommendations ? BSC/3in1  ?  ?Recommendations for Other Services PT consult ? ?  ?Precautions / Restrictions Precautions ?Precautions: Fall;Sternal ?Precaution Booklet  Issued: Yes (comment) ?Precaution Comments: reviewed sternal precautions and "move in the tube."; watch 02 ?Restrictions ?Weight Bearing Restrictions: Yes ?Other Position/Activity Restrictions: sternal precautions  ? ? ?  ? ?Mobility Bed Mobility ?  ?  ?  ?  ?  ?  ?  ?General bed mobility comments: OOB in recliner at beginning and end of session ?  ? ?Transfers ?Overall transfer level: Needs assistance ?Equipment used: Rolling walker (2 wheels) ?Transfers: Sit to/from Stand ?Sit to Stand: Min guard ?  ?  ?  ?  ?  ?General transfer comment: min guard with cues for safe hand placement/sternal precations ?  ?  ?Balance Overall balance assessment: Needs assistance ?Sitting-balance support: Feet supported, No upper extremity supported ?Sitting balance-Leahy Scale: Good ?  ?  ?Standing balance support: During functional activity ?Standing balance-Leahy Scale: Fair ?Standing balance comment: benefits from RW for dymanic balance and safety ?  ?  ?  ?  ?  ?  ?  ?  ?  ?  ?  ?   ? ?ADL either performed or assessed with clinical judgement  ? ?ADL Overall ADL's : Needs assistance/impaired ?  ?  ?  ?  ?Upper Body Bathing: Min guard;With adaptive equipment;Sitting ?Upper Body Bathing Details (indicate cue type and reason): educated on long handle sponge ?Lower Body Bathing: Min guard;With adaptive equipment;Sitting/lateral leans ?Lower Body Bathing Details (indicate cue type and reason): educated and able to provide teach back with long handle sponge ?  ?  ?Lower Body Dressing: Minimal assistance;Min guard;Cueing for compensatory techniques;Sitting/lateral leans;Sit to/from stand ?Lower Body Dressing Details (indicate cue type and reason): Pt able to demonstrate sock donner, use of grabber/reacher for donning underwear/pants, long handle shoe horn (his favorite) all while reinforcing sternal precautions (needed  mod cues) ?Toilet Transfer: Min guard;Cueing for safety;Ambulation;Rolling walker (2 wheels) ?  ?  ?  ?  ?  ?Functional  mobility during ADLs: Min guard;Rolling walker (2 wheels) ?General ADL Comments: minimal focus on in room mobility and transfers for ADL - mostly focued on demo and practice with ADL for LB dressing/bathing and recinforcing sternal precautions ?  ? ?Extremity/Trunk Assessment   ?  ?  ?  ?  ?  ? ?Vision   ?  ?  ?Perception   ?  ?Praxis   ?  ? ?Cognition Arousal/Alertness: Awake/alert ?Behavior During Therapy: Flat affect ?Overall Cognitive Status: Impaired/Different from baseline ?Area of Impairment: Memory, Awareness ?  ?  ?  ?  ?  ?  ?  ?  ?  ?  ?Memory: Decreased short-term memory, Decreased recall of precautions ?  ?  ?Awareness: Emergent ?  ?General Comments: Requires cues to adhere to sternal precautions throughout ADL, in room mobility and for self monitoring for activity. ?  ?  ?   ?Exercises   ? ?  ?Shoulder Instructions   ? ? ?  ?General Comments brought in AE demo kit and educated on reacher/grabber, long handle sponge, sock donner, and long handle shoe horn. no family present at this time. limited mobility due to high BP (RN provided medications during session to address)  ? ? ?Pertinent Vitals/ Pain       Pain Assessment ?Pain Assessment: No/denies pain ?Pain Intervention(s): Monitored during session ? ?Home Living   ?  ?  ?  ?  ?  ?  ?  ?  ?  ?  ?  ?  ?  ?  ?  ?  ?  ?  ? ?  ?Prior Functioning/Environment    ?  ?  ?  ?   ? ?Frequency ? Min 2X/week  ? ? ? ? ?  ?Progress Toward Goals ? ?OT Goals(current goals can now be found in the care plan section) ? Progress towards OT goals: Progressing toward goals ? ?Acute Rehab OT Goals ?Patient Stated Goal: get home ?OT Goal Formulation: With patient ?Time For Goal Achievement: 01/25/22 ?Potential to Achieve Goals: Good  ?Plan Discharge plan remains appropriate;Frequency remains appropriate   ? ?Co-evaluation ? ? ?   ?  ?  ?  ?  ? ?  ?AM-PAC OT "6 Clicks" Daily Activity     ?Outcome Measure ? ? Help from another person eating meals?: A Little ?Help from another  person taking care of personal grooming?: A Little ?Help from another person toileting, which includes using toliet, bedpan, or urinal?: A Lot ?Help from another person bathing (including washing, rinsing, drying)?: A Lot ?Help from another person to put on and taking off regular upper body clothing?: A Lot ?Help from another person to put on and taking off regular lower body clothing?: A Lot ?6 Click Score: 14 ? ?  ?End of Session Equipment Utilized During Treatment: Rolling walker (2 wheels) ? ?OT Visit Diagnosis: Unsteadiness on feet (R26.81);Muscle weakness (generalized) (M62.81);Other symptoms and signs involving cognitive function ?  ?Activity Tolerance Patient tolerated treatment well ?  ?Patient Left in chair;with call bell/phone within reach;with family/visitor present ?  ?Nurse Communication Mobility status;Precautions ?  ? ?   ? ?Time: 6948-5462 ?OT Time Calculation (min): 22 min ? ?Charges: OT General Charges ?$OT Visit: 1 Visit ?OT Treatments ?$Self Care/Home Management : 8-22 mins ? ?Jesse Sans OTR/L ?Acute Rehabilitation Services ?Pager: [phone] ?Office: [phone] ? ? ?Mickel Baas  J  ?01/14/2022, 10:30 AM ?

## 2022-01-15 ENCOUNTER — Telehealth: Payer: Self-pay

## 2022-01-15 DIAGNOSIS — Z951 Presence of aortocoronary bypass graft: Secondary | ICD-10-CM

## 2022-01-15 DIAGNOSIS — I10 Essential (primary) hypertension: Secondary | ICD-10-CM

## 2022-01-15 LAB — GLUCOSE, CAPILLARY: Glucose-Capillary: 150 mg/dL — ABNORMAL HIGH (ref 70–99)

## 2022-01-15 LAB — BASIC METABOLIC PANEL
Anion gap: 10 (ref 5–15)
BUN: 16 mg/dL (ref 8–23)
CO2: 24 mmol/L (ref 22–32)
Calcium: 8.6 mg/dL — ABNORMAL LOW (ref 8.9–10.3)
Chloride: 108 mmol/L (ref 98–111)
Creatinine, Ser: 1.21 mg/dL (ref 0.61–1.24)
GFR, Estimated: >60 mL/min (ref >60)
Glucose, Bld: 134 mg/dL — ABNORMAL HIGH (ref 70–99)
Potassium: 3.2 mmol/L — ABNORMAL LOW (ref 3.5–5.1)
Sodium: 142 mmol/L (ref 135–145)

## 2022-01-15 MED ORDER — POTASSIUM CHLORIDE CRYS ER 20 MEQ PO TBCR: 20.0000 meq | EXTENDED_RELEASE_TABLET | Freq: Every day | ORAL | Status: DC

## 2022-01-15 MED ORDER — POTASSIUM CHLORIDE CRYS ER 10 MEQ PO TBCR
30.0000 meq | EXTENDED_RELEASE_TABLET | Freq: Three times a day (TID) | ORAL | Status: DC
  Administered 2022-01-15: 30 meq via ORAL
  Filled 2022-01-15: qty 3

## 2022-01-15 MED ORDER — SACUBITRIL-VALSARTAN 49-51 MG PO TABS: 1.0000 | ORAL_TABLET | Freq: Two times a day (BID) | ORAL | 1 refills | Status: DC

## 2022-01-15 NOTE — Progress Notes (Addendum)
? ?   ?  [address] ?      [place] [address] ?            [phone]   ? ?  ? ? ?7 Days Post-Op Procedure(s) (LRB): ?CORONARY ARTERY BYPASS GRAFTING (CABG) TIMES THREE USING LEFT INTERNAL MAMMARY ARTERY AND ENDOSCOPICALLY HARVESTED BILATERAL GREATER SAPHENOUS VEINS (N/A) ?TRANSESOPHAGEAL ECHOCARDIOGRAM (TEE) (N/A) ?ENDOVEIN HARVEST OF GREATER SAPHENOUS VEIN (Right) ? ?Subjective: ?Patients denies nausea or abdominal pain. He hopes to go home. ? ?Objective: ?Vital signs in last 24 hours: ?Temp:  [97.9 ?F (36.6 ?C)-98.8 ?F (37.1 ?C)] 98 ?F (36.7 ?C) (03/14 0450) ?Pulse Rate:  [59-64] 64 (03/14 0450) ?Cardiac Rhythm: Normal sinus rhythm (03/13 1900) ?Resp:  [18-21] 20 (03/14 0450) ?BP: (106-183)/(65-82) 156/77 (03/14 0450) ?SpO2:  [95 %-100 %] 95 % (03/14 0450) ?Weight:  [106.2 kg] 106.2 kg (03/14 0042) ? ?Pre op weight 105 kg ?Current Weight  ?01/15/22 106.2 kg  ? ?  ? ?Intake/Output from previous day: ?03/13 0701 - 03/14 0700 ?In: 118 [P.O.:118] ?Out: 400 [Urine:400] ? ? ?Physical Exam: ? ?Cardiovascular: RRR ?Pulmonary: Clear to auscultation bilaterally ?Abdomen: Soft, non tender, bowel sounds present. ?Extremities: Mild bilateral lower extremity edema. ?Wounds: Clean and dry.  No erythema or signs of infection. ? ?Lab Results: ?CBC: ?No results for input(s): WBC, HGB, HCT, PLT in the last 72 hours. ? ?BMET:  ?Recent Labs  ?  01/13/22 ?0308 01/15/22 ?0114  ?NA 138 142  ?K 3.9 3.2*  ?CL 103 108  ?CO2 26 24  ?GLUCOSE 127* 134*  ?BUN 24* 16  ?CREATININE 1.35* 1.21  ?CALCIUM 8.5* 8.6*  ? ?  ?PT/INR:  ?Lab Results  ?Component Value Date  ? INR 1.4 (H) 01/08/2022  ? INR 1.1 01/08/2022  ? INR 0.98 07/10/2017  ? ?ABG:  ?INR: ?Will add last result for INR, ABG once components are confirmed ?Will add last 4 CBG results once components are confirmed ? ?Assessment/Plan: ? ?1. CV - SBR, (SB at times) with HR in the high 60's and hypertensive but less so than yesterday. On Entresto 49/51 bid , Carvedilol 6.25  mg bid, and Apixaban (LV thrombus). Appreciate cardiology evaluation yesterday ?2.  Pulmonary - On room air. History of OSA-on CPAP at night. Encourage incentive spirometer. ?3. Volume Overload - On Lasix 40 mg daily and per Dr. Cliffton Asters, continue for one week ?4.  Expected post op acute blood loss anemia - Last H and H stable at 10 and 30.5 ?5. DM-CBGs 161/165/150. Pre op HGA1C 5.2. Will restart Metformin at discharge. Will consider Marcelline Deist or Jardiance as an outpatient. ?6. Thrombocytopenia-Last platelets up to 128,000 ?7. Supplement potassium ?8. Creatinine decreased to 1.21 this am ?9. As discussed with Dr. Cliffton Asters, will discharge later this am ? ?Aireal Slater M ZimmermanPA-C ?6:56 AM ?  ?

## 2022-01-15 NOTE — Telephone Encounter (Signed)
Calling for TOC. Patient still in hospital. Informed wife we will call back in 1-2 days. ?

## 2022-01-15 NOTE — Progress Notes (Signed)
Physical Therapy Treatment ?Patient Details ?Name: Raymond [name] ?MRN: [id number] ?DOB: October 16, 1951 ?Today's Date: 01/15/2022 ? ? ?History of Present Illness The pt is a 71 yo male presenting 3/4 with cough x2 days. upon work up, concern for new onset CHF. S/p L heart cath and coronary angiography 3/6. 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 ETT 3/7 - 3/8. PMH includes: CVA with R-sided deficits, HTN, HLD, DM II, OSA on CPAP, and depression. ? ?  ?PT Comments  ? ? Pt received up in chair, agreeable to therapy session, limited due to bowel urgency. Pt performed short household distance gait task with RW and Supervision and needed min guard at most for transfers to reinforce safe UE placement for sit<>stand to recliner/toilet surfaces for maintenance of Move in the Tube precautions. Spouse present for verbal/visual instruction for bed mobility and step sequencing. Pt requesting increased time in bathroom so RN/NT made aware he was instructed to call when finished for assist back to chair, spouse also present. Pt continues to benefit from PT services to progress toward functional mobility goals.   ?Recommendations for follow up therapy are one component of a multi-disciplinary discharge planning process, led by the attending physician.  Recommendations may be updated based on patient status, additional functional criteria and insurance authorization. ? ?Follow Up Recommendations ? Home health PT ?  ?  ?Assistance Recommended at Discharge Intermittent Supervision/Assistance  ?Patient can return home with the following A little help with walking and/or transfers;A little help with bathing/dressing/bathroom;Assistance with cooking/housework;Help with stairs or ramp for entrance;Assist for transportation ?  ?Equipment Recommendations ? BSC/3in1;Rolling walker (2 wheels)  ?  ?Recommendations for Other Services   ? ? ?  ?Precautions / Restrictions Precautions ?Precautions: Fall;Sternal ?Precaution Booklet Issued: Yes (comment) ?Precaution Comments:  reviewed sternal precautions and "move in the tube."; watch 02 ?Restrictions ?Other Position/Activity Restrictions: sternal precautions  ?  ? ?Mobility ? Bed Mobility ?Overal bed mobility: Needs Assistance ?  ?  ?  ?  ?  ?  ?General bed mobility comments: OOB in recliner at beginning of session, did give verbal/visual demo for bed mobility technique to spouse while pt was in bathroom to reinforce. ?  ? ?Transfers ?Overall transfer level: Needs assistance ?Equipment used: Rolling walker (2 wheels) ?Transfers: Sit to/from Stand ?Sit to Stand: Min guard ?  ?  ?  ?  ?  ?General transfer comment: min guard with cues for safe hand placement/sternal precautions as he tends to wing elbow out when scooting hips forward initially. ?  ? ?Ambulation/Gait ?Ambulation/Gait assistance: Supervision ?Gait Distance (Feet): 35 Feet ?Assistive device: Rollator (4 wheels) ?Gait Pattern/deviations: Step-through pattern, Decreased stride length ?  ?  ?  ?General Gait Details: Cues for posture and pursed-lip breathing, SpO2 94-97% on RA, HR 70's bpm with exertion ? ? ?Stairs ?Stairs: Yes ? General stair comments: verbal review with spouse for step sequencing; pt defer due to bowel urgency ? ? ?Wheelchair Mobility ?  ? ?Modified Rankin (Stroke Patients Only) ?  ? ? ?  ?Balance Overall balance assessment: Needs assistance ?Sitting-balance support: Feet supported, No upper extremity supported ?Sitting balance-Leahy Scale: Good ?  ?  ?Standing balance support: During functional activity ?Standing balance-Leahy Scale: Fair ?Standing balance comment: benefits from RW for dynamic balance and safety ?  ?  ?  ?  ?  ?  ?  ?  ?  ?  ?  ?  ? ?  ?Cognition Arousal/Alertness: Awake/alert ?Behavior During Therapy: Flat affect ?Overall Cognitive Status: Impaired/Different from baseline ?  Area of Impairment: Memory, Awareness ?  ?  ?  ?  ?  ?  ?  ?  ?  ?  ?Memory: Decreased short-term memory, Decreased recall of precautions ?  ?  ?Awareness: Emergent ?   ?General Comments: Requires cues to adhere to sternal precautions in room mobility, reinforced with pt/spouse ?  ?  ? ?  ?Exercises   ? ?  ?General Comments General comments (skin integrity, edema, etc.): BP 122/70 (85) seated in recliner, pt not dizzy with standing (had received BP medication earlier); HR 66-77 bpm and RR 22 rpm resting, cues for deep/slow breaths; SpO2 WFL on RA with exertion ?  ?  ? ?Pertinent Vitals/Pain Pain Assessment ?Pain Assessment: Faces ?Faces Pain Scale: Hurts a little bit ?Pain Location: sternum ?Pain Descriptors / Indicators: Discomfort, Operative site guarding ?Pain Intervention(s): Monitored during session  ? ? ? ?PT Goals (current goals can now be found in the care plan section) Acute Rehab PT Goals ?Patient Stated Goal: return home ?PT Goal Formulation: With patient/family ?Time For Goal Achievement: 01/23/22 ?Progress towards PT goals: Progressing toward goals ? ?  ?Frequency ? ? ? Min 3X/week ? ? ? ?  ?PT Plan Current plan remains appropriate  ? ? ?   ?AM-PAC PT "6 Clicks" Mobility   ?Outcome Measure ? Help needed turning from your back to your side while in a flat bed without using bedrails?: A Little ?Help needed moving from lying on your back to sitting on the side of a flat bed without using bedrails?: A Little ?Help needed moving to and from a bed to a chair (including a wheelchair)?: A Little ?Help needed standing up from a chair using your arms (e.g., wheelchair or bedside chair)?: A Little ?Help needed to walk in hospital room?: A Little ?Help needed climbing 3-5 steps with a railing? : A Little ?6 Click Score: 18 ? ?  ?End of Session Equipment Utilized During Treatment: Gait belt ?Activity Tolerance: Patient tolerated treatment well ?Patient left: in chair;with call bell/phone within reach;with family/visitor present (spouse in room, pt requesting to sit on toilet longer, RN/NT notified) ?Nurse Communication: Mobility status;Other (comment) (pt bowel urgency, wanting  something to slow it down) ?PT Visit Diagnosis: Unsteadiness on feet (R26.81);Other abnormalities of gait and mobility (R26.89);Difficulty in walking, not elsewhere classified (R26.2);Muscle weakness (generalized) (M62.81) ?  ? ? ?Time: 1040-1059 ?PT Time Calculation (min) (ACUTE ONLY): 19 min ? ?Charges:  $Therapeutic Activity: 8-22 mins          ?          ? ?Majesty Stehlin P., PTA ?Acute Rehabilitation Services ?Pager: [phone] ?Office: [phone]  ? ? ?Cristen Bredeson M Daanya Lanphier ?01/15/2022, 12:20 PM ? ?

## 2022-01-15 NOTE — TOC Transition Note (Addendum)
Transition of Care (TOC) - CM/SW Discharge Note ? ? ?Patient Details  ?Name: WILBURN KEIR ?MRN: [id number] ?Date of Birth: 1951-01-11 ? ?Transition of Care (TOC) CM/SW Contact:  ?Carles Collet, RN ?Phone Number: ?01/15/2022, 9:50 AM ? ? ?Clinical Narrative:   Met with patient and wife at bedside.  ?DME RW and 3/1 has been delivered to the room earlier this week. ?Notified [facility] liaison of DC, Max PT OT follow up. ?Provided coupons for Eliquis and Entresto. Copays are $47 ? ? ? ?Final next level of care: [place] ?Barriers to Discharge: No Barriers Identified ? ? ?Patient Goals and CMS Choice ?Patient states their goals for this hospitalization and ongoing recovery are:: return home ?CMS Medicare.gov Compare Post Acute Care list provided to:: Patient Represenative (must comment) ?Choice offered to / list presented to : Adult Children ? ?Discharge Placement ?  ?           ?  ?  ?  ?  ? ?Discharge Plan and Services ?In-house Referral: NA ?Discharge Planning Services: CM Consult ?Post Acute Care Choice: Home Health, Durable Medical Equipment          ?DME Arranged: 3-N-1, Walker rolling ?DME Agency: [employer] ?Date DME Agency Contacted: 01/07/22 ?Time DME Agency Contacted: [phone] ?Representative spoke with at DME Agency: Adela Lank ?HH Arranged: PT, OT ?[place] Agency: [facility] ?Date HH Agency Contacted: 01/15/22 ?Time [place]: [phone] ?Representative spoke with at [place]: Tommi Rumps ? ?Social Determinants of Health (SDOH) Interventions ?Food Insecurity Interventions: Intervention Not Indicated ?Financial Strain Interventions: Intervention Not Indicated ?Housing Interventions: Intervention Not Indicated ?Transportation Interventions: Intervention Not Indicated ? ? ?Readmission Risk Interventions ?No flowsheet data found. ? ? ? ? ?

## 2022-01-15 NOTE — Progress Notes (Signed)
CARDIAC REHAB PHASE I  ? ?D/c education completed with pt and wife. Pt educated on importance of site care and monitoring incision daily. Encouraged continued IS use, walks, and sternal precautions. Reviewed daily weights, home BP monitoring, restrictions, and exercise guidelines. Referred to [facility]. ? ?[phone] ?Reynold Bowen, RN BSN ?01/15/2022 ?9:28 AM ? ?

## 2022-01-15 NOTE — Progress Notes (Signed)
? ?  Primary Cardiologist:  Dr Hilbert Corrigan ? ?Subjective:  ?Denies SSCP, palpitations or Dyspnea ?Wants to go home BP better  ? ?Objective:  ?Vitals:  ? 01/14/22 2057 01/15/22 0042 01/15/22 0450 01/15/22 0804  ?BP: 106/68 (!) 157/75 (!) 156/77 (!) 166/80  ?Pulse: 64 63 64 65  ?Resp: 18 (!) 21 20 20   ?Temp: 98.5 ?F (36.9 ?C) 97.9 ?F (36.6 ?C) 98 ?F (36.7 ?C) 97.7 ?F (36.5 ?C)  ?TempSrc: Oral Oral Oral Oral  ?SpO2: 97% 97% 95% 96%  ?Weight:  106.2 kg    ?Height:      ? ? ?Intake/Output from previous day: ? ?Intake/Output Summary (Last 24 hours) at 01/15/2022 0921 ?Last data filed at 01/14/2022 1318 ?Gross per 24 hour  ?Intake 0 ml  ?Output 400 ml  ?Net -400 ml  ? ? ?Physical Exam: ? ?No distress ?Lungs clear ?Post CABG/Sternotomy ?Abdomen benign taking PO  ?Right/Left SVG harvest healing well  ? ?Lab Results: ?Basic Metabolic Panel: ?Recent Labs  ?  01/13/22 ?0308 01/15/22 ?0114  ?NA 138 142  ?K 3.9 3.2*  ?CL 103 108  ?CO2 26 24  ?GLUCOSE 127* 134*  ?BUN 24* 16  ?CREATININE 1.35* 1.21  ?CALCIUM 8.5* 8.6*  ? ? ? ?Imaging: ?No results found. ? ?Cardiac Studies: ? ECG: SB rate 54 lateral T wave changes  ? ? Telemetry:  NSR 01/15/2022  ? Echo: EF 35-40%  ? ?Medications: ?  ? apixaban  5 mg Oral BID  ? aspirin EC  81 mg Oral Daily  ? Or  ? aspirin  81 mg Per Tube Daily  ? atorvastatin  40 mg Oral q1800  ? bisacodyl  10 mg Oral Daily  ? Or  ? bisacodyl  10 mg Rectal Daily  ? carvedilol  6.25 mg Oral BID WC  ? docusate sodium  200 mg Oral Daily  ? furosemide  40 mg Oral Daily  ? insulin aspart  0-24 Units Subcutaneous TID WC  ? mouth rinse  15 mL Mouth Rinse BID  ? pantoprazole  40 mg Oral Daily  ? polyethylene glycol  17 g Oral Daily  ? potassium chloride  30 mEq Oral TID  ? [START ON 01/16/2022] potassium chloride  20 mEq Oral Daily  ? sacubitril-valsartan  1 tablet Oral BID  ? sertraline  100 mg Oral Daily  ? sodium chloride flush  10-40 mL Intracatheter Q12H  ? ?  ? ?Assessment/Plan:  ? ?CABG:  Day 7 post LIMA to LAD SVG OM1 and  SVG D1 improved ?HTN:  Improved Mid dose Entresto started yesterday instead of ARB Continue lasix and coreg. Can titrate entresto to high dose as outpatient if needed  ? ?01/18/2022 ?01/15/2022, 9:21 AM ? ? ? ?

## 2022-01-16 DIAGNOSIS — Z48812 Encounter for surgical aftercare following surgery on the circulatory system: Secondary | ICD-10-CM | POA: Diagnosis not present

## 2022-01-16 MED FILL — Sodium Chloride IV Soln 0.9%: INTRAVENOUS | Qty: 2000 | Status: AC

## 2022-01-16 MED FILL — Sodium Bicarbonate IV Soln 8.4%: INTRAVENOUS | Qty: 50 | Status: AC

## 2022-01-16 MED FILL — Mannitol IV Soln 20%: INTRAVENOUS | Qty: 500 | Status: AC

## 2022-01-16 MED FILL — Heparin Sodium (Porcine) Inj 1000 Unit/ML: INTRAMUSCULAR | Qty: 10 | Status: AC

## 2022-01-16 MED FILL — Calcium Chloride Inj 10%: INTRAVENOUS | Qty: 10 | Status: AC

## 2022-01-16 MED FILL — Electrolyte-R (PH 7.4) Solution: INTRAVENOUS | Qty: 4000 | Status: AC

## 2022-01-17 ENCOUNTER — Telehealth: Payer: Self-pay

## 2022-01-17 ENCOUNTER — Other Ambulatory Visit: Payer: Self-pay

## 2022-01-17 MED ORDER — FUROSEMIDE 40 MG PO TABS: 40.0000 mg | ORAL_TABLET | Freq: Every day | ORAL | 3 refills | Status: DC

## 2022-01-17 MED ORDER — POTASSIUM CHLORIDE CRYS ER 20 MEQ PO TBCR: 20.0000 meq | EXTENDED_RELEASE_TABLET | Freq: Every day | ORAL | 3 refills | Status: DC

## 2022-01-17 NOTE — Telephone Encounter (Signed)
TOC: ?Patient contacted regarding discharge from [facility] on 01/15/2022. ? ?Patient understands to follow up with provider Bernadene Person, NP on 01/24/22 at 10:15 am at [facility]. ?Patient understands discharge instructions? Yes ?Patient understands medications and regiment? Yes. Had a question about lasix 40 mg daily and potassium 20 mEq daily. Was only given 7-day supply. ?Patient understands to bring all medications to this visit? Yes ? ?Ask patient:  Are you enrolled in My Chart  Yes ? ? ?Postop Surgical Patients: ?               What is your wound status? Any signs/ symptoms of infection (Temp, redness/ red streaks, swelling, purulent drainage, foul odor or smell)? No. State scabs are intact on chest and leg. Pats them dry after showering. ?             Please do not place any creams/ lotions/ or antibiotic ointment on any surgical incisions/ wounds without physician approval. ?             Do you have any questions about your medications?  All medications (except pain medications) are to be filled by your Cardiologist AFTER your first post op appointment with them.  Are you taking your pain medication? Pain is under control. ?             How is your pain controlled? Pain level? ?             If you require a refill on pain medications, know that the same medication/ amount may not be prescribed or a refill may not be given.  Please contact your pharmacy for refill requests.  ?             Do you have help at home with ADL's?  If you have home health, have you been contacted or seen by the agency? Pt and OT mad home visits. Spouse helps. ?             Please refer to your Pre/post surgery booklet, there is a lot of useful information in it that may answer any questions you may have. ?             Please note that it is ok to remove your surgical dressing, shower (soap/ water), and pat the incision dry. ? ?For surgery related questions staff will route a phone note to CV DIV TCTS TOC pool ? ?[facility] ?[address] ?[place], [place] [address] ? ? ? ?

## 2022-01-17 NOTE — Telephone Encounter (Signed)
Spoke with wife to inform her that refills for lasix and potassium have been approved and sent to [facility]. She thanked me. ?

## 2022-01-18 ENCOUNTER — Telehealth (HOSPITAL_COMMUNITY): Payer: Self-pay

## 2022-01-18 NOTE — Telephone Encounter (Signed)
Per phase I cardiac rehab, fax cardiac rehab referral to [facility]. 

## 2022-01-21 ENCOUNTER — Other Ambulatory Visit: Payer: Self-pay

## 2022-01-21 ENCOUNTER — Ambulatory Visit (HOSPITAL_COMMUNITY)
Admit: 2022-01-21 | Discharge: 2022-01-21 | Disposition: A | Discharge status: Home / Self Care | Payer: Medicare Other | Attending: Adult Health | Admitting: Adult Health

## 2022-01-21 ENCOUNTER — Telehealth (HOSPITAL_COMMUNITY): Payer: Self-pay | Admitting: *Deleted

## 2022-01-21 VITALS — BP 140/77 | HR 56 | Wt 229.0 lb

## 2022-01-21 DIAGNOSIS — N182 Chronic kidney disease, stage 2 (mild): Secondary | ICD-10-CM | POA: Diagnosis not present

## 2022-01-21 DIAGNOSIS — Z8673 Personal history of transient ischemic attack (TIA), and cerebral infarction without residual deficits: Secondary | ICD-10-CM | POA: Diagnosis not present

## 2022-01-21 DIAGNOSIS — I513 Intracardiac thrombosis, not elsewhere classified: Secondary | ICD-10-CM | POA: Diagnosis not present

## 2022-01-21 DIAGNOSIS — Z951 Presence of aortocoronary bypass graft: Secondary | ICD-10-CM | POA: Diagnosis not present

## 2022-01-21 DIAGNOSIS — Z79899 Other long term (current) drug therapy: Secondary | ICD-10-CM | POA: Insufficient documentation

## 2022-01-21 DIAGNOSIS — I2510 Atherosclerotic heart disease of native coronary artery without angina pectoris: Secondary | ICD-10-CM | POA: Insufficient documentation

## 2022-01-21 DIAGNOSIS — J449 Chronic obstructive pulmonary disease, unspecified: Secondary | ICD-10-CM | POA: Diagnosis not present

## 2022-01-21 DIAGNOSIS — G4733 Obstructive sleep apnea (adult) (pediatric): Secondary | ICD-10-CM | POA: Diagnosis not present

## 2022-01-21 DIAGNOSIS — Z7901 Long term (current) use of anticoagulants: Secondary | ICD-10-CM | POA: Insufficient documentation

## 2022-01-21 DIAGNOSIS — Z7902 Long term (current) use of antithrombotics/antiplatelets: Secondary | ICD-10-CM | POA: Diagnosis not present

## 2022-01-21 DIAGNOSIS — I255 Ischemic cardiomyopathy: Secondary | ICD-10-CM | POA: Insufficient documentation

## 2022-01-21 DIAGNOSIS — I10 Essential (primary) hypertension: Secondary | ICD-10-CM

## 2022-01-21 DIAGNOSIS — I5020 Unspecified systolic (congestive) heart failure: Secondary | ICD-10-CM | POA: Diagnosis not present

## 2022-01-21 DIAGNOSIS — Z955 Presence of coronary angioplasty implant and graft: Secondary | ICD-10-CM | POA: Insufficient documentation

## 2022-01-21 DIAGNOSIS — I130 Hypertensive heart and chronic kidney disease with heart failure and stage 1 through stage 4 chronic kidney disease, or unspecified chronic kidney disease: Secondary | ICD-10-CM | POA: Diagnosis not present

## 2022-01-21 DIAGNOSIS — E785 Hyperlipidemia, unspecified: Secondary | ICD-10-CM | POA: Diagnosis not present

## 2022-01-21 DIAGNOSIS — E1122 Type 2 diabetes mellitus with diabetic chronic kidney disease: Secondary | ICD-10-CM | POA: Diagnosis not present

## 2022-01-21 DIAGNOSIS — I2129 ST elevation (STEMI) myocardial infarction involving other sites: Secondary | ICD-10-CM | POA: Diagnosis not present

## 2022-01-21 LAB — BASIC METABOLIC PANEL
Anion gap: 7 (ref 5–15)
BUN: 13 mg/dL (ref 8–23)
CO2: 26 mmol/L (ref 22–32)
Calcium: 8.7 mg/dL — ABNORMAL LOW (ref 8.9–10.3)
Chloride: 107 mmol/L (ref 98–111)
Creatinine, Ser: 1.15 mg/dL (ref 0.61–1.24)
GFR, Estimated: >60 mL/min (ref >60)
Glucose, Bld: 135 mg/dL — ABNORMAL HIGH (ref 70–99)
Potassium: 4.2 mmol/L (ref 3.5–5.1)
Sodium: 140 mmol/L (ref 135–145)

## 2022-01-21 LAB — CBC
HCT: 35.7 % — ABNORMAL LOW (ref 39.0–52.0)
Hemoglobin: 12.3 g/dL — ABNORMAL LOW (ref 13.0–17.0)
MCH: 30 pg (ref 26.0–34.0)
MCHC: 34.5 g/dL (ref 30.0–36.0)
MCV: 87.1 fL (ref 80.0–100.0)
Platelets: 300 10*3/uL (ref 150–400)
RBC: 4.1 MIL/uL — ABNORMAL LOW (ref 4.22–5.81)
RDW: 13.2 % (ref 11.5–15.5)
WBC: 8.1 10*3/uL (ref 4.0–10.5)
nRBC: 0 % (ref 0.0–0.2)

## 2022-01-21 MED ORDER — FUROSEMIDE 40 MG PO TABS: 20.0000 mg | ORAL_TABLET | Freq: Every day | ORAL | 3 refills | Status: DC

## 2022-01-21 MED ORDER — ENTRESTO 97-103 MG PO TABS: 1.0000 | ORAL_TABLET | Freq: Two times a day (BID) | ORAL | 3 refills | Status: DC

## 2022-01-21 MED ORDER — DAPAGLIFLOZIN PROPANEDIOL 10 MG PO TABS: 10.0000 mg | ORAL_TABLET | Freq: Every day | ORAL | 11 refills | Status: DC

## 2022-01-21 NOTE — Progress Notes (Signed)
? ? ?HEART & VASCULAR TRANSITION OF CARE CONSULT NOTE  ? ? ? ?Referring Physician: Dr Cyndia Bent ?Primary Care:Dr Ruele ?Primary Cardiologist: Dr Sallyanne Kuster ?CT Surgery: Dr Kipp Brood ? ?HPI: ?Referred to clinic by Dr Cyndia Bent for heart failure consultation.  ? 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  ? ?Admitted with NSTEMI. Had cath with multivessel coronary disease. Echo with LV thrombus and EF 35-40%. Underwent CABG X 3. Intraop EF >45%  LIMA to LAD, reverse saphenous vein graft to OM1, and first diagonal. Post op course relatively stable. Had issues with hypertension/AKI. Started on GDMT with coreg and entresto. Arlyce Harman held due to AKI.  ? ?Presents today with his wife. She has been checking BP/weight/pulse daily. Overall feeling stronger everyday. Mild SOB with exertion. Using walker to assist with stability. Able to walk 10 minutes without stopping.  Denies PND/Orthopnea. Complaining of some leg edema. No chest pain. Appetite ok. No fever or chills. Weight at home  230-231 pounds. SBP at home 148-184. Taking all medications. Followed by HHPT. ? ?Cardiac Testing  ?Echo 01/06/22 EF 35-40% LV thrombus RV normal Grade II DD ? ?LHC 01/07/22  ?Ost LM to Mid LM lesion is 95% stenosed. ?  Mid LAD lesion is 100% stenosed. ?  2nd Diag lesion is 95% stenosed ? ?Review of Systems: [y] = yes, [ ]  = no  ? ?General: Weight gain [ ] ; Weight loss [ ] ; Anorexia [ ] ; Fatigue [ TY]; Fever [ ] ; Chills [ ] ; Weakness [ ]   ?Cardiac: Chest pain/pressure [ ] ; Resting SOB [ ] ; Exertional SOB [Y ]; Orthopnea [ ] ; Pedal Edema [ ] ; Palpitations [ ] ; Syncope [ ] ; Presyncope [ ] ; Paroxysmal nocturnal dyspnea[ ]   ?Pulmonary: Cough [ ] ; Wheezing[ ] ; Hemoptysis[ ] ; Sputum [ ] ; Snoring [ ]   ?GI: Vomiting[ ] ; Dysphagia[ ] ; Melena[ ] ; Hematochezia [ ] ;  Heartburn[ ] ; Abdominal pain [ ] ; Constipation [ ] ; Diarrhea [ ] ; BRBPR [ ]   ?GU: Hematuria[ ] ; Dysuria [ ] ; Nocturia[ ]   ?Vascular: Pain in legs with walking [ ] ; Pain in feet with lying flat [ ] ; Non-healing sores [ ] ; Stroke [Y ]; TIA [ ] ; Slurred speech [ ] ;  ?Neuro: Headaches[ ] ; Vertigo[ ] ; Seizures[ ] ; Paresthesias[ ] ;Blurred vision [ ] ; Diplopia [ ] ; Vision changes [ ]   ?Ortho/Skin: Arthritis [ ] ; Joint pain [ Y]; Muscle pain [ ] ; Joint swelling [ ] ; Back Pain [ Y]; Rash [ ]   ?Psych: Depression[ ] ; Anxiety[ ]   ?Heme: Bleeding problems [ ] ; Clotting disorders [ ] ; Anemia [ ]   ?Endocrine: Diabetes [Y ]; Thyroid dysfunction[ ]  ? ? ?Past Medical History:  ?Diagnosis Date  ? Arthritis   ? Depression   ? Diabetes mellitus type 2 in obese [facility])   ? Hypertension   ? Migraines   ? Sleep apnea   ? Stroke [facility])   ? ? ?Current Outpatient Medications  ?Medication Sig Dispense Refill  ? acetaminophen (TYLENOL) 500 MG tablet Take 2 tablets (1,000 mg total) by mouth every 6 (six) hours as needed. (Patient taking differently: Take 1,000 mg by mouth every 6 (six) hours as needed for mild pain.) 30 tablet 0  ? apixaban (ELIQUIS) 5 MG TABS tablet Take 1 tablet (5 mg total) by mouth 2 (two) times daily. 60 tablet 3  ? aspirin EC 81 MG EC tablet Take  1 tablet (81 mg total) by mouth daily. Swallow whole. 30 tablet 11  ? atorvastatin (LIPITOR) 40 MG tablet Take 1 tablet (40 mg total) by mouth daily at 6 PM. 30 tablet 3  ? carvedilol (COREG) 6.25 MG tablet Take 1 tablet (6.25 mg total) by mouth 2 (two) times daily with a meal. 60 tablet 3  ? furosemide (LASIX) 40 MG tablet Take 1 tablet (40 mg total) by mouth daily. 30 tablet 3  ? levocetirizine (XYZAL) 5 MG tablet Take 5 mg by mouth daily as needed for allergies.    ? metFORMIN (GLUCOPHAGE) 500 MG tablet Take 1 tablet (500 mg total) by mouth at bedtime. (Patient taking differently: Take 500 mg by mouth 2 (two) times daily with a meal.)    ? oxyCODONE (OXY IR/ROXICODONE) 5 MG  immediate release tablet Take 1 tablet (5 mg total) by mouth every 6 (six) hours as needed for severe pain. 28 tablet 0  ? potassium chloride SA (KLOR-CON M) 20 MEQ tablet Take 1 tablet (20 mEq total) by mouth daily. 30 tablet 3  ? sacubitril-valsartan (ENTRESTO) 49-51 MG Take 1 tablet by mouth 2 (two) times daily. 60 tablet 1  ? sertraline (ZOLOFT) 100 MG tablet Take 100 mg by mouth daily.     ? ?No current facility-administered medications for this encounter.  ? ? ?Allergies  ?Allergen Reactions  ? Flomax [Tamsulosin Hcl] Other (See Comments)  ?  HYPOtension and syncope  ? ? ?  ?Social History  ? ?Socioeconomic History  ? Marital status: Married  ?  Spouse name: Marlowe Kays  ? Number of children: Not on file  ? Years of education: Not on file  ? Highest education level: Bachelor's degree (e.g., BA, AB, BS)  ?Occupational History  ? Occupation: retired  ?  Comment: RN  ?Tobacco Use  ? Smoking status: Light Smoker  ?  Types: Cigars  ? Smokeless tobacco: Never  ? Tobacco comments:  ?  chew on cigars its not lit  ?Substance and Sexual Activity  ? Alcohol use: No  ?  Alcohol/week: 0.0 standard drinks  ? Drug use: No  ? Sexual activity: Not Currently  ?Other Topics Concern  ? Not on file  ?Social History Narrative  ? Not on file  ? ?Social Determinants of Health  ? ?Financial Resource Strain: Low Risk   ? Difficulty of Paying Living Expenses: Not very hard  ?Food Insecurity: No Food Insecurity  ? Worried About Charity fundraiser in the Last Year: Never true  ? Ran Out of Food in the Last Year: Never true  ?Transportation Needs: No Transportation Needs  ? Lack of Transportation (Medical): No  ? Lack of Transportation (Non-Medical): No  ?Physical Activity: Not on file  ?Stress: Not on file  ?Social Connections: Not on file  ?Intimate Partner Violence: Not on file  ? ? ?  ?Family History  ?Problem Relation Age of Onset  ? Other Mother   ?     reports in good health  ? Other Father   ?     was estranged from family, unknown  medical history  ? ? ?Vitals:  ? 01/21/22 1446  ?BP: 140/77  ?Pulse: (!) 56  ?SpO2: 98%  ?Weight: 103.9 kg (229 lb)  ? ?Wt Readings from Last 3 Encounters:  ?01/21/22 103.9 kg (229 lb)  ?01/15/22 106.2 kg (234 lb 2.1 oz)  ?06/27/20 101.6 kg (224 lb)  ? ? ? ?PHYSICAL EXAM: ?General:  Ambulated in the clinic with  a walker. No respiratory difficulty ?HEENT: normal ?Neck: supple. JVP 8-9 . Carotids 2+ bilat; no bruits. No lymphadenopathy or thryomegaly appreciated. ?Cor: PMI nondisplaced. Regular rate & rhythm. No rubs, gallops or murmurs. Sternal with one clear suture low portion. No drainage.  ?Lungs: clear ?Abdomen: soft, nontender, nondistended. No hepatosplenomegaly. No bruits or masses. Good bowel sounds. ?Extremities: no cyanosis, clubbing, rash, Ra dn LLE trace edema ?Neuro: alert & oriented x 3, cranial nerves grossly intact. moves all 4 extremities w/o difficulty. Affect pleasant. ? ?[id number] Brady 55 bpm personally checked  ? ? ?ASSESSMENT & PLAN: ?1. HFrEF, ICM  ?01/06/22 Echo Ef 35-40% 01/08/22 TEE EF >45% ?-NYHA II-III. Volume status mildly elevated.  ?- Add farxiga 10 mg daily. Given 30 day free card.  ?-Cut back lasix to 20 mg daily.  ?- Continue coreg 6.25 mg twice a day. Bradycardic today. No room to up -titrate.  ?- Increase entresto 97-103 twice a day  ?-MRA- hold off and check labs today.  ?- Check BMET today  ?- Repeat ECHO in 3 months after HF meds optimized.  ? ?2. CAD ?S/P CABG x3. LIMA to LAD, reverse saphenous vein graft to OM1, and first diagonal ?- On asa/elqius/statin/bb ?- no chest pain.  ? ?3. LV thrombus  ?-On eliqus 5 mg twice a day  ? ?4. CKD Stage II ?-Check BMET today  ? ?5. HTN  ?See above.  ? ?Check BMET and CBC.  ? ?Referred to HFSW (PCP, Medications, Transportation, ETOH Abuse, Drug Abuse, Insurance, Financial ): No ?Refer to Pharmacy:  No ?Refer to Home Health: Already followed by [facility]  ?Refer to Advanced Heart Failure Clinic: No  ?Refer to General Cardiology: Dr Sallyanne Kuster  ? ?Follow  up as needed.  ? ?Ani Deoliveira NP-C  ?2:58 PM ? ? ?

## 2022-01-21 NOTE — Patient Instructions (Addendum)
Increase Entresto to 97/103 Twice daily ? ?Start Farxgia 10 mg daily. ? ?Decrease lasix to 20 mg daily. ? ?Labs done today, your results will be available in MyChart, we will contact you for abnormal readings. ? ?Thank you for allowing [place] to provider your heart failure care after your recent hospitalization. Please follow-up with Bernadene Person NP at [facility] Cardiology office. ? ?If you have any questions, issues, or concerns before your next appointment please call our office at [phone], opt. 2 and leave a message for the triage nurse. ? ? ? ?

## 2022-01-21 NOTE — Telephone Encounter (Signed)
Called to confirm Heart & Vascular Transitions of Care appointment at 3pm today 3/20. Patient reminded to bring all medications and pill box organizer with them. Confirmed patient has transportation. Gave directions, instructed to utilize valet parking. ? ?Confirmed appointment prior to ending call.  ? ?Rhae Hammock, BSN, RN ?Heart Failure Nurse Navigator ?[phone] ? ?

## 2022-01-24 ENCOUNTER — Ambulatory Visit: Payer: Medicare Other | Admitting: Nurse Practitioner

## 2022-01-24 ENCOUNTER — Encounter: Payer: Self-pay | Admitting: Nurse Practitioner

## 2022-01-24 ENCOUNTER — Other Ambulatory Visit: Payer: Self-pay

## 2022-01-24 VITALS — BP 160/80 | HR 55 | Ht 65.0 in | Wt 226.8 lb

## 2022-01-24 DIAGNOSIS — I2510 Atherosclerotic heart disease of native coronary artery without angina pectoris: Secondary | ICD-10-CM | POA: Diagnosis not present

## 2022-01-24 DIAGNOSIS — I5022 Chronic systolic (congestive) heart failure: Secondary | ICD-10-CM

## 2022-01-24 DIAGNOSIS — Z951 Presence of aortocoronary bypass graft: Secondary | ICD-10-CM | POA: Diagnosis not present

## 2022-01-24 DIAGNOSIS — I255 Ischemic cardiomyopathy: Secondary | ICD-10-CM

## 2022-01-24 DIAGNOSIS — I10 Essential (primary) hypertension: Secondary | ICD-10-CM | POA: Diagnosis not present

## 2022-01-24 DIAGNOSIS — G4733 Obstructive sleep apnea (adult) (pediatric): Secondary | ICD-10-CM

## 2022-01-24 DIAGNOSIS — I513 Intracardiac thrombosis, not elsewhere classified: Secondary | ICD-10-CM

## 2022-01-24 DIAGNOSIS — I639 Cerebral infarction, unspecified: Secondary | ICD-10-CM

## 2022-01-24 DIAGNOSIS — E782 Mixed hyperlipidemia: Secondary | ICD-10-CM

## 2022-01-24 DIAGNOSIS — N182 Chronic kidney disease, stage 2 (mild): Secondary | ICD-10-CM

## 2022-01-24 MED ORDER — SPIRONOLACTONE 25 MG PO TABS: 12.5000 mg | ORAL_TABLET | Freq: Every day | ORAL | 3 refills | Status: DC

## 2022-01-24 NOTE — Progress Notes (Addendum)
? ? ?Office Visit  ?  ?Patient Name: Raymond ReachRoger D Olthoff ?Date of Encounter: 01/24/2022 ? ?Primary Care Provider:  Forrest Moronuehle, Stephen, MD ?Primary Cardiologist:  Thurmon FairMihai Croitoru, MD ? ?Chief Complaint  ?  ?71 year old male with a history of CAD s/p CABG x3, chronic systolic heart failure, ICM, LV thrombus, hypertension, hyperlipidemia, CVA s/p loop recorder insertion, type 2 diabetes, OSA, COPD, depression, and anxiety who presents post hospital follow-up related to CAD s/p CABG x3 and heart failure. ? ?Past Medical History  ?  ?Past Medical History:  ?Diagnosis Date  ? Arthritis   ? Depression   ? Diabetes mellitus type 2 in obese [facility](HCC)   ? Hypertension   ? Migraines   ? Sleep apnea   ? Stroke [facility](HCC)   ? ?Past Surgical History:  ?Procedure Laterality Date  ? CHOLECYSTECTOMY    ? CORONARY ANGIOGRAPHY N/A 01/07/2022  ? Procedure: CORONARY ANGIOGRAPHY;  Surgeon: Runell GessBerry, Jonathan J, MD;  [facility];  Service: Cardiovascular;  Laterality: N/A;  ? CORONARY ARTERY BYPASS GRAFT N/A 01/08/2022  ? Procedure: CORONARY ARTERY BYPASS GRAFTING (CABG) TIMES THREE USING LEFT INTERNAL MAMMARY ARTERY AND ENDOSCOPICALLY HARVESTED BILATERAL GREATER SAPHENOUS VEINS;  Surgeon: Corliss SkainsLightfoot, Harrell O, MD;  Location: [facility] OR;  Service: Open Heart Surgery;  Laterality: N/A;  ? ENDOVEIN HARVEST OF GREATER SAPHENOUS VEIN Right 01/08/2022  ? Procedure: ENDOVEIN HARVEST OF GREATER SAPHENOUS VEIN;  Surgeon: Corliss SkainsLightfoot, Harrell O, MD;  Location: [facility] OR;  Service: Open Heart Surgery;  Laterality: Right;  ? KNEE ARTHROSCOPY    ? LOOP RECORDER INSERTION N/A 06/24/2017  ? Procedure: LOOP RECORDER INSERTION;  Surgeon: Hillis RangeAllred, James, MD;  Location: [facility] INVASIVE CV LAB;  Service: Cardiovascular;  Laterality: N/A;  ? ROTATOR CUFF REPAIR    ? TEE WITHOUT CARDIOVERSION N/A 06/24/2017  ? Procedure: TRANSESOPHAGEAL ECHOCARDIOGRAM (TEE) WITH LOOP;  Surgeon: Elease HashimotoNahser, Deloris PingPhilip J, MD;  [facility];  Service: Cardiovascular;  Laterality: N/A;  ? TEE WITHOUT  CARDIOVERSION N/A 01/08/2022  ? Procedure: TRANSESOPHAGEAL ECHOCARDIOGRAM (TEE);  Surgeon: Corliss SkainsLightfoot, Harrell O, MD;  [facility];  Service: Open Heart Surgery;  Laterality: N/A;  ? ? ?Allergies ? ?Allergies  ?Allergen Reactions  ? Flomax [Tamsulosin Hcl] Other (See Comments)  ?  HYPOtension and syncope  ? ? ?History of Present Illness  ?  ?71 year old male with the above past medical history including CAD s/p CABG x3, chronic systolic heart failure, ICM, LV thrombus, hypertension, hyperlipidemia, CVA s/p loop recorder insertion, type 2 diabetes, OSA, COPD, depression, and anxiety. ? ?He presented to the ED on 01/05/2022 in the setting of NSTEMI.  Hospitalized from 01/05/2022 to 01/15/2022 in the setting of NSTEMI.  Cardiac catheterization showed multivessel disease (oLM-mLM 95%, mLAD 100%, and D2 95%). Echocardiogram showed EF 35 to 40%, LV thrombus. Carotid dopplers showed 1 to 39% B ICA stenosis.  CT surgery was consulted and he underwent CABG x3 (LIMA-LAD, reverse SVG- OM1, and first diagonal) on 01/08/2022. He was started on Eliquis in the setting of LV thrombus per CT surgery. He was diuresed with IV Lasix in the setting of fluid volume overload.  He developed a post-op ileus which later resolved. I.  Spironolactone and Entresto were held secondary to acute kidney injury. He was discharged home able condition on 01/15/2022. He was referred to the advanced heart failure clinic for outpatient evaluation and was started on Farxiga, Sherryll Burgerntresto was increased.  ? ?He presents today for follow-up accompanied by his wife. Since his hospitalization he has done well  from a cardiac standpoint.  He does have some ongoing lower extremity edema, however, this is much improved, his weight is also down. He denies dyspnea, worsening edema, PND, orthopnea, weight gain, denies symptoms concerning for angina. His BP is still elevated.  Other than his elevated blood pressure, he denies any specific concerns or complaints today. ? ?Home  Medications  ?  ?Current Outpatient Medications  ?Medication Sig Dispense Refill  ? acetaminophen (TYLENOL) 500 MG tablet Take 2 tablets (1,000 mg total) by mouth every 6 (six) hours as needed. (Patient taking differently: Take 1,000 mg by mouth every 6 (six) hours as needed for mild pain.) 30 tablet 0  ? apixaban (ELIQUIS) 5 MG TABS tablet Take 1 tablet (5 mg total) by mouth 2 (two) times daily. 60 tablet 3  ? aspirin EC 81 MG EC tablet Take 1 tablet (81 mg total) by mouth daily. Swallow whole. 30 tablet 11  ? atorvastatin (LIPITOR) 40 MG tablet Take 1 tablet (40 mg total) by mouth daily at 6 PM. 30 tablet 3  ? carvedilol (COREG) 6.25 MG tablet Take 1 tablet (6.25 mg total) by mouth 2 (two) times daily with a meal. 60 tablet 3  ? dapagliflozin propanediol (FARXIGA) 10 MG TABS tablet Take 1 tablet (10 mg total) by mouth daily before breakfast. 30 tablet 11  ? furosemide (LASIX) 40 MG tablet Take 0.5 tablets (20 mg total) by mouth daily. 30 tablet 3  ? levocetirizine (XYZAL) 5 MG tablet Take 5 mg by mouth daily as needed for allergies.    ? metFORMIN (GLUCOPHAGE) 500 MG tablet Take 1 tablet (500 mg total) by mouth at bedtime. (Patient taking differently: Take 500 mg by mouth 2 (two) times daily with a meal.)    ? sacubitril-valsartan (ENTRESTO) 97-103 MG Take 1 tablet by mouth 2 (two) times daily. 180 tablet 3  ? sertraline (ZOLOFT) 100 MG tablet Take 100 mg by mouth daily.     ? spironolactone (ALDACTONE) 25 MG tablet Take 0.5 tablets (12.5 mg total) by mouth daily. 45 tablet 3  ? oxyCODONE (OXY IR/ROXICODONE) 5 MG immediate release tablet Take 1 tablet (5 mg total) by mouth every 6 (six) hours as needed for severe pain. (Patient not taking: Reported on 01/24/2022) 28 tablet 0  ? ?No current facility-administered medications for this visit.  ?  ? ?Review of Systems  ?  ?He denies chest pain, palpitations, dyspnea, pnd, orthopnea, n, v, dizziness, syncope, weight gain, or early satiety. All other systems reviewed and  are otherwise negative except as noted above.  ? ?Physical Exam  ?  ?VS:  BP (!) 160/80 (BP Location: Left Arm, Patient Position: Sitting, Cuff Size: Normal)   Pulse (!) 55   Ht 5\' 5"  (1.651 m)   Wt 226 lb 12.8 oz (102.9 kg)   SpO2 94%   BMI 37.74 kg/m?  ?GEN: Well nourished, well developed, in no acute distress. ?HEENT: normal. ?Neck: Supple, no JVD, carotid bruits, or masses. ?Cardiac: RRR, no murmurs, rubs, or gallops. No clubbing, cyanosis, edema.  Radials/DP/PT 2+ and equal bilaterally.  ?Respiratory:  Respirations regular and unlabored, clear to auscultation bilaterally. ?GI: Soft, nontender, nondistended, BS + x 4. ?MS: no deformity or atrophy. ?Skin: warm and dry, no rash.  Sternotomy incision clean clean, dry, intact and well approximated. ?Neuro:  Strength and sensation are intact. ?Psych: Normal affect. ? ?Accessory Clinical Findings  ?  ?ECG personally reviewed by me today -no EKG in office today. ? ?Lab Results  ?Component  Value Date  ? WBC 8.1 01/21/2022  ? HGB 12.3 (L) 01/21/2022  ? HCT 35.7 (L) 01/21/2022  ? MCV 87.1 01/21/2022  ? PLT 300 01/21/2022  ? ?Lab Results  ?Component Value Date  ? CREATININE 1.15 01/21/2022  ? BUN 13 01/21/2022  ? NA 140 01/21/2022  ? K 4.2 01/21/2022  ? CL 107 01/21/2022  ? CO2 26 01/21/2022  ? ?Lab Results  ?Component Value Date  ? ALT 6 01/10/2022  ? AST 17 01/10/2022  ? ALKPHOS 23 (L) 01/10/2022  ? BILITOT 0.6 01/10/2022  ? ?Lab Results  ?Component Value Date  ? CHOL 124 01/06/2022  ? HDL 35 (L) 01/06/2022  ? LDLCALC 67 01/06/2022  ? TRIG 111 01/06/2022  ? CHOLHDL 3.5 01/06/2022  ?  ?Lab Results  ?Component Value Date  ? HGBA1C 5.2 01/07/2022  ? ? ?Assessment & Plan  ?  ?1. CAD: S/p CABG x3 (LIMA-LAD, reverse SVG- OM1, and first diagonal) on 01/08/2022. Stable with no anginal symptoms. Will review with primary cardiologist for consideration of adding Plavix in the future in the setting of NSTEMI per hospital note recommendations (not initiated during hospitalization  due to Eliquis therapy in the setting of LV thrombus). Continue aspirin, carvedilol, Farxiga, Lasix, Entresto, and Lipitor. ? ?Addendum 01/25/2022: Per review with Dr. Royann Shivers, will start Plavix 75 mg daily given

## 2022-01-24 NOTE — Patient Instructions (Signed)
Medication Instructions:  ?Stop taking potassium ?Start taking spironolactone 12.5 mg once a day ?*If you need a refill on your cardiac medications before your next appointment, please call your pharmacy* ? ? ?Lab Work: ?Return in 1 - 2 weeks for BMET ?If you have labs (blood work) drawn today and your tests are completely normal, you will receive your results only by: ?MyChart Message (if you have MyChart) OR ?A paper copy in the mail ?If you have any lab test that is abnormal or we need to change your treatment, we will call you to review the results. ? ? ?Follow-Up: ?At [facility], you and your health needs are our priority.  As part of our continuing mission to provide you with exceptional heart care, we have created designated Provider Care Teams.  These Care Teams include your primary Cardiologist (physician) and Advanced Practice Providers (APPs -  Physician Assistants and Nurse Practitioners) who all work together to provide you with the care you need, when you need it. ? ?We recommend signing up for the patient portal called "MyChart".  Sign up information is provided on this After Visit Summary.  MyChart is used to connect with patients for Virtual Visits (Telemedicine).  Patients are able to view lab/test results, encounter notes, upcoming appointments, etc.  Non-urgent messages can be sent to your provider as well.   ?To learn more about what you can do with MyChart, go to [email or web address].   ? ?Your next appointment:   ?1 month(s) ? ?The format for your next appointment:   ?In Person ? ?Provider:   ?Thurmon Fair, MD or Bernadene Person, NP ? ? ? ?

## 2022-01-25 ENCOUNTER — Ambulatory Visit (INDEPENDENT_AMBULATORY_CARE_PROVIDER_SITE_OTHER): Payer: Self-pay | Admitting: Thoracic Surgery (Cardiothoracic Vascular Surgery)

## 2022-01-25 VITALS — BP 162/78 | HR 62 | Resp 20 | Ht 65.0 in | Wt 224.0 lb

## 2022-01-25 DIAGNOSIS — Z951 Presence of aortocoronary bypass graft: Secondary | ICD-10-CM

## 2022-01-25 NOTE — Progress Notes (Signed)
? ?   ?  [address] ?      [place] [address] ?            [phone]       ? ?[name] ?Calvin Record [id number] ?Date of Birth: December 30, 1950 ? ?Referring: Lorretta Harp, MD ?Primary Care: Charleston Poot, MD ?Primary Cardiologist:Mihai Croitoru, MD ? ?Reason for visit:   follow-up ? ?History of Present Illness:     ?Mr. Antelo presents for his 1 week follow-up appointment.  Overall he is doing well.  He has complaints today. ? ?Physical Exam: ?BP (!) 162/78 (BP Location: Right Arm, Patient Position: Sitting)   Pulse 62   Resp 20   Ht 5\' 5"  (1.651 m)   Wt 224 lb (101.6 kg)   SpO2 95% Comment: RA  BMI 37.28 kg/m?  ? ?Alert NAD ?Incision clean.  Sternum stable ?Abdomen, ND ?No peripheral edema ? ? ? ?  ? ?Assessment / Plan:   ?71 year old male status post CABG.  Currently doing well.  He will follow-up in 1 month with a chest x-ray. ? ? ?Lucile Crater Brentley Landfair ?01/25/2022 3:38 PM ? ? ? ? ? ? ?

## 2022-01-29 ENCOUNTER — Other Ambulatory Visit: Payer: Self-pay

## 2022-01-29 ENCOUNTER — Telehealth: Payer: Self-pay

## 2022-01-29 MED ORDER — CLOPIDOGREL BISULFATE 75 MG PO TABS: 75.0000 mg | ORAL_TABLET | Freq: Every day | ORAL | 3 refills | Status: DC

## 2022-01-29 MED ORDER — AMLODIPINE BESYLATE 5 MG PO TABS
5.0000 mg | ORAL_TABLET | Freq: Every day | ORAL | 3 refills | Status: DC
Start: 2022-01-29 — End: 2022-05-16

## 2022-01-29 NOTE — Telephone Encounter (Signed)
Spoke with pts spouse. She was notified of recommendations of starting Plavix 75 mg daily, monitor any signs of bleeding and monitor blood pressure. Pts spouse said she was going to call our office to discuss pts BP. Pts BP continues to be elevated 178/89,190/89. BP is taken first thing in the morning, mid morning after meds are taken and at night. BP is worse at night. Pt feels good with no problems to report other than the higher blood pressure.  ?

## 2022-01-29 NOTE — Telephone Encounter (Signed)
Spoke with pts spouse. She is aware that pt is to start Amlodipine 5 mg daily, monitor Blood Pressure and report blood pressure consistently greater than 130/80. RX sent to pts pharmacy.  ?

## 2022-02-04 LAB — BASIC METABOLIC PANEL
BUN/Creatinine Ratio: 19 (ref 10–24)
BUN: 30 mg/dL — ABNORMAL HIGH (ref 8–27)
CO2: 27 mmol/L (ref 20–29)
Calcium: 9.6 mg/dL (ref 8.6–10.2)
Chloride: 98 mmol/L (ref 96–106)
Creatinine, Ser: 1.57 mg/dL — ABNORMAL HIGH (ref 0.76–1.27)
Glucose: 121 mg/dL — ABNORMAL HIGH (ref 70–99)
Potassium: 5.5 mmol/L — ABNORMAL HIGH (ref 3.5–5.2)
Sodium: 138 mmol/L (ref 134–144)
eGFR: 47 mL/min/{1.73_m2} — ABNORMAL LOW (ref >59)

## 2022-02-06 ENCOUNTER — Telehealth: Payer: Self-pay | Admitting: Nurse Practitioner

## 2022-02-06 DIAGNOSIS — E875 Hyperkalemia: Secondary | ICD-10-CM

## 2022-02-06 NOTE — Telephone Encounter (Signed)
° °  Pt's wife returning call to get lab result °

## 2022-02-06 NOTE — Telephone Encounter (Signed)
Raymond Grapes, NP  ?02/06/2022  5:50 AM EDT   ?  ?Recent labs show kidney function is somewhat decreased compared to previous labs. Potassium is also somewhat elevated (5.5>>normal is 3.5-5.2). I would like to recheck a BMET as sometimes the potassium can be falsely elevated. However, if repeat lab work shows potassium remains elevated, we may need to consider discontinuing the spironolactone as this can raise potassium levels in the blood. I will be in touch with repeat lab results. Thank you!   ? ?Patient's wife notified of results ?Repeat BMET ordered ?

## 2022-02-08 LAB — BASIC METABOLIC PANEL
BUN/Creatinine Ratio: 15 (ref 10–24)
BUN: 20 mg/dL (ref 8–27)
CO2: 21 mmol/L (ref 20–29)
Calcium: 9.1 mg/dL (ref 8.6–10.2)
Chloride: 103 mmol/L (ref 96–106)
Creatinine, Ser: 1.37 mg/dL — ABNORMAL HIGH (ref 0.76–1.27)
Glucose: 186 mg/dL — ABNORMAL HIGH (ref 70–99)
Potassium: 4.3 mmol/L (ref 3.5–5.2)
Sodium: 144 mmol/L (ref 134–144)
eGFR: 55 mL/min/{1.73_m2} — ABNORMAL LOW (ref >59)

## 2022-02-11 ENCOUNTER — Telehealth: Payer: Self-pay

## 2022-02-11 NOTE — Telephone Encounter (Addendum)
Called patient regarding results. Patient had understanding of results.----- Message from Lenna Sciara, NP sent at 02/11/2022  5:13 AM EDT ----- ?Recent labs show stable kidney function and electrolytes.  Okay to continue current medications and follow-up as planned.  Thank you. ?

## 2022-02-20 ENCOUNTER — Other Ambulatory Visit: Payer: Self-pay | Admitting: Thoracic Surgery (Cardiothoracic Vascular Surgery)

## 2022-02-20 DIAGNOSIS — Z951 Presence of aortocoronary bypass graft: Secondary | ICD-10-CM

## 2022-02-21 ENCOUNTER — Ambulatory Visit (INDEPENDENT_AMBULATORY_CARE_PROVIDER_SITE_OTHER): Payer: Self-pay | Admitting: Physician Assistant

## 2022-02-21 ENCOUNTER — Ambulatory Visit
Admission: RE | Admit: 2022-02-21 | Discharge: 2022-02-21 | Disposition: A | Discharge status: Home / Self Care | Payer: Medicare Other | Source: Ambulatory Visit | Attending: Thoracic Surgery (Cardiothoracic Vascular Surgery) | Admitting: Thoracic Surgery (Cardiothoracic Vascular Surgery)

## 2022-02-21 VITALS — BP 113/75 | HR 61 | Resp 20 | Ht 65.0 in | Wt 216.8 lb

## 2022-02-21 DIAGNOSIS — Z951 Presence of aortocoronary bypass graft: Secondary | ICD-10-CM

## 2022-02-21 NOTE — Patient Instructions (Signed)

## 2022-02-21 NOTE — Progress Notes (Signed)
? ?   ?[address] ?      [place] [address] ?            [phone]   ? ?  ? ?HPI: ?Patient returns for routine postoperative follow-up having undergone CABG x 3 on 01/08/2022. ?The patient's early postoperative recovery while in the hospital was notable for post operative ileus.  He also had post operative delirium.  Since hospital discharge the patient reports patient reports he is doing very well.  He is however having some sinus issues.  He is no longer requiring pain medication.  He is ambulating without difficulty.  His surgical incisions have healed without issues.  He is possibly interested in cardiac rehab, however he states it would be a while before he can get in.  ? ?Current Outpatient Medications  ?Medication Sig Dispense Refill  ? acetaminophen (TYLENOL) 500 MG tablet Take 2 tablets (1,000 mg total) by mouth every 6 (six) hours as needed. (Patient taking differently: Take 1,000 mg by mouth every 6 (six) hours as needed for mild pain.) 30 tablet 0  ? amLODipine (NORVASC) 5 MG tablet Take 1 tablet (5 mg total) by mouth daily. 30 tablet 3  ? apixaban (ELIQUIS) 5 MG TABS tablet Take 1 tablet (5 mg total) by mouth 2 (two) times daily. 60 tablet 3  ? aspirin EC 81 MG EC tablet Take 1 tablet (81 mg total) by mouth daily. Swallow whole. 30 tablet 11  ? atorvastatin (LIPITOR) 40 MG tablet Take 1 tablet (40 mg total) by mouth daily at 6 PM. 30 tablet 3  ? carvedilol (COREG) 6.25 MG tablet Take 1 tablet (6.25 mg total) by mouth 2 (two) times daily with a meal. 60 tablet 3  ? clopidogrel (PLAVIX) 75 MG tablet Take 1 tablet (75 mg total) by mouth daily. 30 tablet 3  ? dapagliflozin propanediol (FARXIGA) 10 MG TABS tablet Take 1 tablet (10 mg total) by mouth daily before breakfast. 30 tablet 11  ? furosemide (LASIX) 40 MG tablet Take 0.5 tablets (20 mg total) by mouth daily. 30 tablet 3  ? levocetirizine (XYZAL) 5 MG tablet Take 5 mg by mouth daily as needed for allergies.    ? metFORMIN  (GLUCOPHAGE) 500 MG tablet Take 1 tablet (500 mg total) by mouth at bedtime. (Patient taking differently: Take 500 mg by mouth 2 (two) times daily with a meal.)    ? sacubitril-valsartan (ENTRESTO) 97-103 MG Take 1 tablet by mouth 2 (two) times daily. 180 tablet 3  ? sertraline (ZOLOFT) 100 MG tablet Take 100 mg by mouth daily.     ? spironolactone (ALDACTONE) 25 MG tablet Take 0.5 tablets (12.5 mg total) by mouth daily. 45 tablet 3  ? ?No current facility-administered medications for this visit.  ? ? ?Physical Exam: ? ?BP 113/75 (BP Location: Right Arm, Patient Position: Sitting, Cuff Size: Large)   Pulse 61   Resp 20   Ht 5\' 5"  (1.651 m)   Wt 216 lb 12.8 oz (98.3 kg)   SpO2 97% Comment: RA  BMI 36.08 kg/m?  ? ?Gen: NAD  ?Heart:RRR ?Lungs:CTA bilaterally ? non-tender, non-distended ?Ext:no edema ?Incisions: well healed ? ?Diagnostic Tests: ? ?CXR: post surgical sternal wires in place, no pleural effusion, pneumothorax ? ?A/P: ? ?S/P CABG x 3- recovering well and without difficulty, continue current medications ?Cardiac Rehab- okay to start if patient wishes to participate ?Activity- okay to resume driving, activity limitations discussed, further recovery instructions provided ? ?RTC Prn, follow  up with Cardiology as scheduled ? ?Lowella Dandy, PA-C ?[facility] ?[phone] ? ?

## 2022-02-25 ENCOUNTER — Encounter: Payer: Self-pay | Admitting: Nurse Practitioner

## 2022-02-25 ENCOUNTER — Ambulatory Visit: Payer: Medicare Other | Admitting: Nurse Practitioner

## 2022-02-25 VITALS — BP 110/66 | HR 65 | Resp 20 | Ht 65.0 in | Wt 217.0 lb

## 2022-02-25 DIAGNOSIS — I2510 Atherosclerotic heart disease of native coronary artery without angina pectoris: Secondary | ICD-10-CM

## 2022-02-25 DIAGNOSIS — G4733 Obstructive sleep apnea (adult) (pediatric): Secondary | ICD-10-CM

## 2022-02-25 DIAGNOSIS — I255 Ischemic cardiomyopathy: Secondary | ICD-10-CM

## 2022-02-25 DIAGNOSIS — I513 Intracardiac thrombosis, not elsewhere classified: Secondary | ICD-10-CM | POA: Diagnosis not present

## 2022-02-25 DIAGNOSIS — I5022 Chronic systolic (congestive) heart failure: Secondary | ICD-10-CM | POA: Diagnosis not present

## 2022-02-25 DIAGNOSIS — E119 Type 2 diabetes mellitus without complications: Secondary | ICD-10-CM

## 2022-02-25 DIAGNOSIS — I639 Cerebral infarction, unspecified: Secondary | ICD-10-CM

## 2022-02-25 DIAGNOSIS — E785 Hyperlipidemia, unspecified: Secondary | ICD-10-CM

## 2022-02-25 DIAGNOSIS — N182 Chronic kidney disease, stage 2 (mild): Secondary | ICD-10-CM

## 2022-02-25 DIAGNOSIS — I10 Essential (primary) hypertension: Secondary | ICD-10-CM

## 2022-02-25 NOTE — Patient Instructions (Signed)
Medication Instructions:  ?Your physician recommends that you continue on your current medications as directed. Please refer to the Current Medication list given to you today.  ?*If you need a refill on your cardiac medications before your next appointment, please call your pharmacy* ? ? ?Lab Work: ?NONE ordered at this time of appointment  ? ?If you have labs (blood work) drawn today and your tests are completely normal, you will receive your results only by: ?MyChart Message (if you have MyChart) OR ?A paper copy in the mail ?If you have any lab test that is abnormal or we need to change your treatment, we will call you to review the results. ? ? ?Testing/Procedures: ?Your physician has requested that you have an echocardiogram. Echocardiography is a painless test that uses sound waves to create images of your heart. It provides your doctor with information about the size and shape of your heart and how well your heart?s chambers and valves are working. This procedure takes approximately one hour. There are no restrictions for this procedure.  ? ? ?Follow-Up: ?At [facility], you and your health needs are our priority.  As part of our continuing mission to provide you with exceptional heart care, we have created designated Provider Care Teams.  These Care Teams include your primary Cardiologist (physician) and Advanced Practice Providers (APPs -  Physician Assistants and Nurse Practitioners) who all work together to provide you with the care you need, when you need it. ? ?We recommend signing up for the patient portal called "MyChart".  Sign up information is provided on this After Visit Summary.  MyChart is used to connect with patients for Virtual Visits (Telemedicine).  Patients are able to view lab/test results, encounter notes, upcoming appointments, etc.  Non-urgent messages can be sent to your provider as well.   ?To learn more about what you can do with MyChart, go to [email or web address].   ? ?Your  next appointment:   ?3-4 month(s) ? ?The format for your next appointment:   ?In Person ? ?Provider:   ?Thurmon Fair, MD   ? ? ?Other Instructions ? ? ?Important Information About Sugar ? ? ? ? ? ? ?

## 2022-02-25 NOTE — Progress Notes (Signed)
? ? ?Office Visit  ?  ?Patient Name: Raymond [name] ?Date of Encounter: 02/25/2022 ? ?Primary Care Provider:  Forrest Moron, MD ?Primary Cardiologist:  Thurmon Fair, MD ? ?Chief Complaint  ?  ?71 year old male with a history of CAD s/p CABG x3, chronic systolic heart failure, ICM, LV thrombus, hypertension, hyperlipidemia, CVA s/p loop recorder insertion, type 2 diabetes, OSA, COPD, depression, and anxiety who presents  follow-up related to CAD and heart failure. ? ?Past Medical History  ?  ?Past Medical History:  ?Diagnosis Date  ? Arthritis   ? Depression   ? Diabetes mellitus type 2 in obese [facility])   ? Hypertension   ? Migraines   ? Sleep apnea   ? Stroke [facility])   ? ?Past Surgical History:  ?Procedure Laterality Date  ? CHOLECYSTECTOMY    ? CORONARY ANGIOGRAPHY N/A 01/07/2022  ? Procedure: CORONARY ANGIOGRAPHY;  Surgeon: Runell Gess, MD;  Location: [facility] INVASIVE CV LAB;  Service: Cardiovascular;  Laterality: N/A;  ? CORONARY ARTERY BYPASS GRAFT N/A 01/08/2022  ? Procedure: CORONARY ARTERY BYPASS GRAFTING (CABG) TIMES THREE USING LEFT INTERNAL MAMMARY ARTERY AND ENDOSCOPICALLY HARVESTED BILATERAL GREATER SAPHENOUS VEINS;  Surgeon: Corliss Skains, MD;  Location: [facility] OR;  Service: Open Heart Surgery;  Laterality: N/A;  ? ENDOVEIN HARVEST OF GREATER SAPHENOUS VEIN Right 01/08/2022  ? Procedure: ENDOVEIN HARVEST OF GREATER SAPHENOUS VEIN;  Surgeon: Corliss Skains, MD;  Location: [facility] OR;  Service: Open Heart Surgery;  Laterality: Right;  ? KNEE ARTHROSCOPY    ? LOOP RECORDER INSERTION N/A 06/24/2017  ? Procedure: LOOP RECORDER INSERTION;  Surgeon: Hillis Range, MD;  Location: [facility] INVASIVE CV LAB;  Service: Cardiovascular;  Laterality: N/A;  ? ROTATOR CUFF REPAIR    ? TEE WITHOUT CARDIOVERSION N/A 06/24/2017  ? Procedure: TRANSESOPHAGEAL ECHOCARDIOGRAM (TEE) WITH LOOP;  Surgeon: Elease Hashimoto Deloris Ping, MD;  Location[facility] ENDOSCOPY;  Service: Cardiovascular;  Laterality: N/A;  ? TEE WITHOUT CARDIOVERSION N/A 01/08/2022  ?  Procedure: TRANSESOPHAGEAL ECHOCARDIOGRAM (TEE);  Surgeon: Corliss Skains, MD;  Location: [facility] OR;  Service: Open Heart Surgery;  Laterality: N/A;  ? ? ?Allergies ? ?Allergies  ?Allergen Reactions  ? Flomax [Tamsulosin Hcl] Other (See Comments)  ?  HYPOtension and syncope  ? ? ?History of Present Illness  ?  ?71 year old male with the above past medical history including CAD s/p CABG x3, chronic systolic heart failure, ICM, LV thrombus, hypertension, hyperlipidemia, CVA s/p loop recorder insertion, type 2 diabetes, OSA, COPD, depression, and anxiety. ?  ?He presented to the ED on 01/05/2022 in the setting of NSTEMI. Cardiac catheterization showed multivessel disease (oLM-mLM 95%, mLAD 100%, and D2 95%). Echocardiogram showed EF 35 to 40%, LV thrombus. Carotid dopplers showed 1 to 39% B ICA stenosis. CT surgery was consulted and he underwent CABG x3 (LIMA-LAD, reverse SVG- OM1, and first diagonal) on 01/08/2022. He was started on Eliquis in the setting of LV thrombus per CT surgery. He developed a post-op ileus which later resolved. He did have significant fluid volume overload while in the hospital and required IV diuresis. Spironolactone and Entresto were held secondary to acute kidney injury. He was discharged home in stable condition on 01/15/2022. He was referred to the advanced heart failure clinic for outpatient evaluation and was started on Farxiga, Sherryll Burger was increased. ? ?He was last seen in the office on 01/24/2022 and was stable overall from a cardiac standpoint. His BP was elevated and he did have some ongoing bilateral lower extremity edema, he was  started on spironolactone. He notified our office on 01/29/2022 of ongoing elevated BP and was was started on amlodipine. He presents today for follow-up accompanied by his wife. Since his last visit well from a cardiac standpoint.  He denies dyspnea, PND, orthopnea, edema, weight gain, denies symptoms concerning for angina.  BP has been well controlled.  He  is driving and working on gradually increasing his activity as tolerated.  He is waiting to get into cardiac rehab, and is thinking about joining the [facility] in the meantime. Overall, he reports feeling well and denies any new concerns today. ? ?Home Medications  ?  ?Current Outpatient Medications  ?Medication Sig Dispense Refill  ? acetaminophen (TYLENOL) 500 MG tablet Take 2 tablets (1,000 mg total) by mouth every 6 (six) hours as needed. (Patient taking differently: Take 1,000 mg by mouth every 6 (six) hours as needed for mild pain.) 30 tablet 0  ? amLODipine (NORVASC) 5 MG tablet Take 1 tablet (5 mg total) by mouth daily. 30 tablet 3  ? apixaban (ELIQUIS) 5 MG TABS tablet Take 1 tablet (5 mg total) by mouth 2 (two) times daily. 60 tablet 3  ? aspirin EC 81 MG EC tablet Take 1 tablet (81 mg total) by mouth daily. Swallow whole. 30 tablet 11  ? atorvastatin (LIPITOR) 40 MG tablet Take 1 tablet (40 mg total) by mouth daily at 6 PM. 30 tablet 3  ? benzonatate (TESSALON) 100 MG capsule Take 100 mg by mouth 3 (three) times daily as needed.    ? carvedilol (COREG) 6.25 MG tablet Take 1 tablet (6.25 mg total) by mouth 2 (two) times daily with a meal. 60 tablet 3  ? clopidogrel (PLAVIX) 75 MG tablet Take 1 tablet (75 mg total) by mouth daily. 30 tablet 3  ? dapagliflozin propanediol (FARXIGA) 10 MG TABS tablet Take 1 tablet (10 mg total) by mouth daily before breakfast. 30 tablet 11  ? dextromethorphan 15 MG/5ML syrup Take 10 mLs by mouth 3 (three) times daily as needed for cough.    ? furosemide (LASIX) 40 MG tablet Take 0.5 tablets (20 mg total) by mouth daily. 30 tablet 3  ? levocetirizine (XYZAL) 5 MG tablet Take 5 mg by mouth daily as needed for allergies.    ? metFORMIN (GLUCOPHAGE) 500 MG tablet Take 1 tablet (500 mg total) by mouth at bedtime. (Patient taking differently: Take 500 mg by mouth 2 (two) times daily with a meal.)    ? sacubitril-valsartan (ENTRESTO) 97-103 MG Take 1 tablet by mouth 2 (two) times daily.  180 tablet 3  ? sertraline (ZOLOFT) 100 MG tablet Take 100 mg by mouth daily.     ? spironolactone (ALDACTONE) 25 MG tablet Take 0.5 tablets (12.5 mg total) by mouth daily. 45 tablet 3  ? ?No current facility-administered medications for this visit.  ?  ? ?Review of Systems  ?  ?He denies chest pain, palpitations, dyspnea, pnd, orthopnea, n, v, dizziness, syncope, edema, weight gain, or early satiety. All other systems reviewed and are otherwise negative except as noted above.  ? ?Physical Exam  ?  ?VS:  BP 110/66 (BP Location: Left Arm, Patient Position: Sitting, Cuff Size: Large)   Pulse 65   Resp 20   Ht 5\' 5"  (1.651 m)   Wt 217 lb (98.4 kg)   SpO2 97%   BMI 36.11 kg/m?   ?GEN: Well nourished, well developed, in no acute distress. ?HEENT: normal. ?Neck: Supple, no JVD, carotid bruits, or masses. ?Cardiac: RRR,  no murmurs, rubs, or gallops. No clubbing, cyanosis, edema.  Radials/DP/PT 2+ and equal bilaterally.  Sternal incision clean, dry, intact, with well approximated edges. ?Respiratory:  Respirations regular and unlabored, clear to auscultation bilaterally. ?GI: Obese, soft, nontender, nondistended, BS + x 4. ?MS: no deformity or atrophy. ?Skin: warm and dry, no rash. ?Neuro:  Strength and sensation are intact. ?Psych: Normal affect. ? ?Accessory Clinical Findings  ?  ?ECG personally reviewed by me today - No EKG in office today.  ? ?Lab Results  ?Component Value Date  ? WBC 8.1 01/21/2022  ? HGB 12.3 (L) 01/21/2022  ? HCT 35.7 (L) 01/21/2022  ? MCV 87.1 01/21/2022  ? PLT 300 01/21/2022  ? ?Lab Results  ?Component Value Date  ? CREATININE 1.37 (H) 02/08/2022  ? BUN 20 02/08/2022  ? NA 144 02/08/2022  ? K 4.3 02/08/2022  ? CL 103 02/08/2022  ? CO2 21 02/08/2022  ? ?Lab Results  ?Component Value Date  ? ALT 6 01/10/2022  ? AST 17 01/10/2022  ? ALKPHOS 23 (L) 01/10/2022  ? BILITOT 0.6 01/10/2022  ? ?Lab Results  ?Component Value Date  ? CHOL 124 01/06/2022  ? HDL 35 (L) 01/06/2022  ? LDLCALC 67 01/06/2022   ? TRIG 111 01/06/2022  ? CHOLHDL 3.5 01/06/2022  ?  ?Lab Results  ?Component Value Date  ? HGBA1C 5.2 01/07/2022  ? ? ?Assessment & Plan  ?  ?1. CAD: S/p CABG x3 (LIMA-LAD, reverse SVG- OM1, and first diagon

## 2022-03-18 ENCOUNTER — Ambulatory Visit (HOSPITAL_COMMUNITY): Payer: Medicare Other | Attending: Cardiovascular Disease

## 2022-03-18 DIAGNOSIS — I5022 Chronic systolic (congestive) heart failure: Secondary | ICD-10-CM | POA: Diagnosis present

## 2022-03-18 LAB — ECHOCARDIOGRAM COMPLETE
Area-P 1/2: 3.03 cm2
S' Lateral: 3.1 cm

## 2022-03-18 MED ORDER — PERFLUTREN LIPID MICROSPHERE
1.0000 mL | INTRAVENOUS | Status: AC | PRN
  Administered 2022-03-18: 1 mL via INTRAVENOUS

## 2022-03-22 ENCOUNTER — Telehealth: Payer: Self-pay

## 2022-03-22 NOTE — Telephone Encounter (Signed)
Spoke with pts spouse. She was notified of echocardiogram results and recommendations to d/c Eliquis ok per Dr. Royann Shivers. Pt will continue medications and follow up as planned.

## 2022-03-25 MED ORDER — FUROSEMIDE 40 MG PO TABS
20.0000 mg | ORAL_TABLET | Freq: Every day | ORAL | 3 refills | Status: DC | PRN
Start: 2022-03-25 — End: 2022-05-18

## 2022-03-25 NOTE — Telephone Encounter (Signed)
Called patient to discuss-spoke to wife (ok per DPR).  Wife states when patient went for the echocardiogram the tech told the patient he was dehydrated.   They are wondering due to this if they need to continue to furosemide.  He is currently taking 20 mg daily.   She states his BP has been great (120s/60s), weight has been stable and he has no swelling.    Advised I do not see mention of this but would send message to [facility] NP to review.

## 2022-04-15 ENCOUNTER — Other Ambulatory Visit: Payer: Self-pay | Admitting: Physician Assistant

## 2022-05-01 ENCOUNTER — Other Ambulatory Visit: Payer: Self-pay | Admitting: Nurse Practitioner

## 2022-05-16 ENCOUNTER — Other Ambulatory Visit: Payer: Self-pay | Admitting: Physician Assistant

## 2022-05-16 ENCOUNTER — Ambulatory Visit: Payer: Medicare Other | Admitting: Cardiovascular Disease

## 2022-05-16 ENCOUNTER — Encounter: Payer: Self-pay | Admitting: Cardiovascular Disease

## 2022-05-16 VITALS — BP 100/52 | HR 61 | Ht 64.0 in | Wt 219.8 lb

## 2022-05-16 DIAGNOSIS — E785 Hyperlipidemia, unspecified: Secondary | ICD-10-CM

## 2022-05-16 DIAGNOSIS — I2510 Atherosclerotic heart disease of native coronary artery without angina pectoris: Secondary | ICD-10-CM

## 2022-05-16 DIAGNOSIS — I5032 Chronic diastolic (congestive) heart failure: Secondary | ICD-10-CM | POA: Diagnosis not present

## 2022-05-16 DIAGNOSIS — G4733 Obstructive sleep apnea (adult) (pediatric): Secondary | ICD-10-CM

## 2022-05-16 DIAGNOSIS — I10 Essential (primary) hypertension: Secondary | ICD-10-CM

## 2022-05-16 DIAGNOSIS — E119 Type 2 diabetes mellitus without complications: Secondary | ICD-10-CM

## 2022-05-16 DIAGNOSIS — N1831 Chronic kidney disease, stage 3a: Secondary | ICD-10-CM

## 2022-05-16 NOTE — Patient Instructions (Signed)
Medication Instructions:  STOP the Amlodipine  *If you need a refill on your cardiac medications before your next appointment, please call your pharmacy*   Lab Work: None ordered If you have labs (blood work) drawn today and your tests are completely normal, you will receive your results only by: MyChart Message (if you have MyChart) OR A paper copy in the mail If you have any lab test that is abnormal or we need to change your treatment, we will call you to review the results.   Testing/Procedures: None ordered   Follow-Up: At [facility], you and your health needs are our priority.  As part of our continuing mission to provide you with exceptional heart care, we have created designated Provider Care Teams.  These Care Teams include your primary Cardiologist (physician) and Advanced Practice Providers (APPs -  Physician Assistants and Nurse Practitioners) who all work together to provide you with the care you need, when you need it.  We recommend signing up for the patient portal called "MyChart".  Sign up information is provided on this After Visit Summary.  MyChart is used to connect with patients for Virtual Visits (Telemedicine).  Patients are able to view lab/test results, encounter notes, upcoming appointments, etc.  Non-urgent messages can be sent to your provider as well.   To learn more about what you can do with MyChart, go to [email or web address].    Your next appointment:   8 month(s)  The format for your next appointment:   In Person  Provider:   Thurmon Fair, MD {    Important Information About Sugar

## 2022-05-17 NOTE — Progress Notes (Unsigned)
Cardiology Office Note:    Date:  05/18/2022   ID:  Raymond [name], DOB 1951-05-30, MRN [id number]  PCP:  Charleston Poot, [place] Providers Cardiologist:  Sanda Klein, MD     Referring MD: Charleston Poot, MD   Chief Complaint  Patient presents with   Coronary Artery Disease    History of Present Illness:    Raymond [name] is a 71 y.o. male with a hx of CAD presenting with acute coronary syndrome (NSTEMI 01/05/2022) and moderate ischemic cardiomyopathy (EF 35-40%-) with presence of apical LV thrombus, for which he underwent three-vessel CABG (01/08/2022 LIMA-LAD, SVG-OM, SVG-diagonal).  Additional problems include a remote history of cryptogenic stroke (unrevealing loop recorder for years), type 2 diabetes mellitus, hypertension, hypercholesterolemia, OSA on CPAP.  He had transient kidney injury which delayed initiation of heart failure medications, but he is now on Entresto maximal dose, carvedilol, Farxiga, spironolactone as well as aspirin and atorvastatin.  He has recovered remarkably well.  He has NYHA functional class I.  He enjoyed cardiac rehab but has become sedentary again after completing it.    Echo performed on 03/18/2022 shows EF has improved to 65-70%, although he still has an apical area of hypokinesis.  Left ventricular thrombus is no longer seen.  Eliquis has been stopped.  Renal function has also (current and his most recent creatinine was 1.37 (GFR 55).  Lipid profile in March showed LDL cholesterol of 67, HDL 35.  The patient specifically denies any chest pain at rest exertion, dyspnea at rest or with exertion, orthopnea, paroxysmal nocturnal dyspnea, syncope, palpitations, focal neurological deficits, intermittent claudication, lower extremity edema, unexplained weight gain, cough, hemoptysis or wheezing.   Past Medical History:  Diagnosis Date   Arthritis    Depression    Diabetes mellitus type 2 in obese ([place])    Hypertension    Migraines     Sleep apnea    Stroke [facility])     Past Surgical History:  Procedure Laterality Date   CHOLECYSTECTOMY     CORONARY ANGIOGRAPHY N/A 01/07/2022   Procedure: CORONARY ANGIOGRAPHY;  Surgeon: Lorretta Harp, MD;  Location: [place] [facility];  Service: Cardiovascular;  Laterality: N/A;   CORONARY ARTERY BYPASS GRAFT N/A 01/08/2022   Procedure: CORONARY ARTERY BYPASS GRAFTING (CABG) TIMES THREE USING LEFT INTERNAL MAMMARY ARTERY AND ENDOSCOPICALLY HARVESTED BILATERAL GREATER SAPHENOUS VEINS;  Surgeon: Lajuana Matte, MD;  Location: [place];  Service: Open Heart Surgery;  Laterality: N/A;   ENDOVEIN HARVEST OF GREATER SAPHENOUS VEIN Right 01/08/2022   Procedure: ENDOVEIN HARVEST OF GREATER SAPHENOUS VEIN;  Surgeon: Lajuana Matte, MD;  Location: [place];  Service: Open Heart Surgery;  Laterality: Right;   KNEE ARTHROSCOPY     LOOP RECORDER INSERTION N/A 06/24/2017   Procedure: LOOP RECORDER INSERTION;  Surgeon: Thompson Grayer, MD;  Location: [place] [facility];  Service: Cardiovascular;  Laterality: N/A;   ROTATOR CUFF REPAIR     TEE WITHOUT CARDIOVERSION N/A 06/24/2017   Procedure: TRANSESOPHAGEAL ECHOCARDIOGRAM (TEE) WITH LOOP;  Surgeon: Acie Fredrickson Wonda Cheng, MD;  Location: [facility];  Service: Cardiovascular;  Laterality: N/A;   TEE WITHOUT CARDIOVERSION N/A 01/08/2022   Procedure: TRANSESOPHAGEAL ECHOCARDIOGRAM (TEE);  Surgeon: Lajuana Matte, MD;  Location: [place];  Service: Open Heart Surgery;  Laterality: N/A;    Current Medications: Current Meds  Medication Sig   aspirin EC 81 MG EC tablet Take 1 tablet (81 mg total) by mouth daily. Swallow whole.  atorvastatin (LIPITOR) 40 MG tablet Take 1 tablet (40 mg total) by mouth daily at 6 PM.   carvedilol (COREG) 6.25 MG tablet Take 1 tablet (6.25 mg total) by mouth 2 (two) times daily with a meal.   clopidogrel (PLAVIX) 75 MG tablet TAKE 1 TABLET BY MOUTH EVERY DAY   dapagliflozin propanediol (FARXIGA) 10 MG TABS tablet Take 1 tablet  (10 mg total) by mouth daily before breakfast.   levocetirizine (XYZAL) 5 MG tablet Take 5 mg by mouth daily as needed for allergies.   metFORMIN (GLUCOPHAGE) 500 MG tablet Take 1 tablet (500 mg total) by mouth at bedtime. (Patient taking differently: Take 500 mg by mouth 2 (two) times daily with a meal.)   sacubitril-valsartan (ENTRESTO) 97-103 MG Take 1 tablet by mouth 2 (two) times daily.   sertraline (ZOLOFT) 100 MG tablet Take 100 mg by mouth daily.    spironolactone (ALDACTONE) 25 MG tablet Take 0.5 tablets (12.5 mg total) by mouth daily.   [DISCONTINUED] amLODipine (NORVASC) 5 MG tablet Take 1 tablet (5 mg total) by mouth daily.     Allergies:   Flomax [tamsulosin hcl]   Social History   Socioeconomic History   Marital status: Married    Spouse name: Marlowe Kays   Number of children: Not on file   Years of education: Not on file   Highest education level: Bachelor's degree (e.g., BA, AB, BS)  Occupational History   Occupation: retired    Comment: Therapist, sports  Tobacco Use   Smoking status: Former    Types: Cigars    Quit date: 01/17/2022    Years since quitting: 0.3   Smokeless tobacco: Never   Tobacco comments:    chew on cigars its not lit  Substance and Sexual Activity   Alcohol use: No    Alcohol/week: 0.0 standard drinks of alcohol   Drug use: No   Sexual activity: Not Currently  Other Topics Concern   Not on file  Social History Narrative   Not on file   Social Determinants of Health   Financial Resource Strain: Low Risk  (01/07/2022)   Overall Financial Resource Strain (CARDIA)    Difficulty of Paying Living Expenses: Not very hard  Food Insecurity: No Food Insecurity (01/07/2022)   Hunger Vital Sign    Worried About Running Out of Food in the Last Year: Never true    Ran Out of Food in the Last Year: Never true  Transportation Needs: No Transportation Needs (01/07/2022)   PRAPARE - Hydrologist (Medical): No    Lack of Transportation  (Non-Medical): No  Physical Activity: Not on file  Stress: Not on file  Social Connections: Not on file     Family History: The patient's family history includes Other in his father and mother.  ROS:   Please see the history of present illness.     All other systems reviewed and are negative.  EKGs/Labs/Other Studies Reviewed:    The following studies were reviewed today: ECHO 03/18/2022   1. LV has improved to 65-70%. The apical septum and apex are mildly  hypokinetic. No LV thrombus on this study. Left ventricular ejection  fraction, by estimation, is 65 to 70%. The left ventricle has normal  function. The left ventricle demonstrates  regional wall motion abnormalities (see scoring diagram/findings for  description). Left ventricular diastolic parameters are consistent with  Grade I diastolic dysfunction (impaired relaxation).   2. Right ventricular systolic function is normal. The right ventricular  size is normal. Tricuspid regurgitation signal is inadequate for assessing  PA pressure.   3. The mitral valve is grossly normal. Trivial mitral valve  regurgitation. No evidence of mitral stenosis.   4. The aortic valve is tricuspid. Aortic valve regurgitation is not  visualized. No aortic stenosis is present.   5. The inferior vena cava is normal in size with greater than 50%  respiratory variability, suggesting right atrial pressure of 3 mmHg.   Comparison(s): Changes from prior study are noted. EF has improved. LV  thrombus is no longer present.   EKG:  EKG is not ordered today.  The ekg ordered 01/24/2022 demonstrates bradycardia, T wave inversion in the lateral leads, no Q waves.  Recent Labs: 01/05/2022: B Natriuretic Peptide 1,096.9 01/10/2022: ALT 6; Magnesium 2.4 01/21/2022: Hemoglobin 12.3; Platelets 300 02/08/2022: BUN 20; Creatinine, Ser 1.37; Potassium 4.3; Sodium 144  Recent Lipid Panel    Component Value Date/Time   CHOL 124 01/06/2022 0146   TRIG 111  01/06/2022 0146   HDL 35 (L) 01/06/2022 0146   CHOLHDL 3.5 01/06/2022 0146   VLDL 22 01/06/2022 0146   LDLCALC 67 01/06/2022 0146     Risk Assessment/Calculations:           Physical Exam:    VS:  BP (!) 100/52 (BP Location: Left Arm, Patient Position: Sitting, Cuff Size: Large)   Pulse 61   Ht 5\' 4"  (1.626 m)   Wt 219 lb 12.8 oz (99.7 kg)   SpO2 94%   BMI 37.73 kg/m     Wt Readings from Last 3 Encounters:  05/16/22 219 lb 12.8 oz (99.7 kg)  02/25/22 217 lb (98.4 kg)  02/21/22 216 lb 12.8 oz (98.3 kg)     GEN: Severely obese well nourished, well developed in no acute distress HEENT: Normal NECK: No JVD; No carotid bruits LYMPHATICS: No lymphadenopathy CARDIAC: RRR, no murmurs, rubs, gallops RESPIRATORY:  Clear to auscultation without rales, wheezing or rhonchi  ABDOMEN: Soft, non-tender, non-distended MUSCULOSKELETAL:  No edema; No deformity  SKIN: Warm and dry NEUROLOGIC:  Alert and oriented x 3 PSYCHIATRIC:  Normal affect   ASSESSMENT:    1. Chronic diastolic heart failure (HCC)   2. Coronary artery disease involving native coronary artery of native heart without angina pectoris   3. Essential hypertension   4. Hyperlipidemia LDL goal <70   5. Type 2 diabetes mellitus without complication, without long-term current use of insulin (HCC)   6. OSA (obstructive sleep apnea)   7. Severe obesity (BMI 35.0-39.9) with comorbidity (HCC)   8. Stage 3a chronic kidney disease (HCC)    PLAN:    In order of problems listed above:  CHF: recovered EF. NYHA class 1, not on loop diuretics. Continue meds. Try to enroll in assistance programs for farxiga and Entresto. CAD: presented with NSTEMI, continue clopidogrel through March 2024.  HTN: controlled, even low. Stop the amlodipine. HLP:  on effective statin, LDL target <70. DM: good control. Farxiga excellent choice. OSA: compliant w CPAP. Obesity: stay physically ctive, discussed healthy diet choices. CKD stage 3a: GFR  around 55.        Medication Adjustments/Labs and Tests Ordered: Current medicines are reviewed at length with the patient today.  Concerns regarding medicines are outlined above.  No orders of the defined types were placed in this encounter.  No orders of the defined types were placed in this encounter.   Patient Instructions  Medication Instructions:  STOP the Amlodipine  *If you need  a refill on your cardiac medications before your next appointment, please call your pharmacy*   Lab Work: None ordered If you have labs (blood work) drawn today and your tests are completely normal, you will receive your results only by: MyChart Message (if you have MyChart) OR A paper copy in the mail If you have any lab test that is abnormal or we need to change your treatment, we will call you to review the results.   Testing/Procedures: None ordered   Follow-Up: At [facility], you and your health needs are our priority.  As part of our continuing mission to provide you with exceptional heart care, we have created designated Provider Care Teams.  These Care Teams include your primary Cardiologist (physician) and Advanced Practice Providers (APPs -  Physician Assistants and Nurse Practitioners) who all work together to provide you with the care you need, when you need it.  We recommend signing up for the patient portal called "MyChart".  Sign up information is provided on this After Visit Summary.  MyChart is used to connect with patients for Virtual Visits (Telemedicine).  Patients are able to view lab/test results, encounter notes, upcoming appointments, etc.  Non-urgent messages can be sent to your provider as well.   To learn more about what you can do with MyChart, go to [email or web address].    Your next appointment:   8 month(s)  The format for your next appointment:   In Person  Provider:   Thurmon Fair, MD {    Important Information About Sugar          Signed, Thurmon Fair, MD  05/18/2022 12:27 PM    [facility]

## 2022-05-21 ENCOUNTER — Encounter: Payer: Self-pay | Admitting: Medical

## 2022-05-21 ENCOUNTER — Ambulatory Visit (INDEPENDENT_AMBULATORY_CARE_PROVIDER_SITE_OTHER): Payer: Medicare Other | Admitting: Medical

## 2022-05-21 VITALS — BP 110/70 | HR 54 | Resp 18 | Ht 64.0 in | Wt 222.4 lb

## 2022-05-21 DIAGNOSIS — I2510 Atherosclerotic heart disease of native coronary artery without angina pectoris: Secondary | ICD-10-CM | POA: Diagnosis not present

## 2022-05-21 DIAGNOSIS — Z951 Presence of aortocoronary bypass graft: Secondary | ICD-10-CM | POA: Diagnosis not present

## 2022-05-21 DIAGNOSIS — E118 Type 2 diabetes mellitus with unspecified complications: Secondary | ICD-10-CM | POA: Diagnosis not present

## 2022-05-21 DIAGNOSIS — I639 Cerebral infarction, unspecified: Secondary | ICD-10-CM

## 2022-05-21 DIAGNOSIS — I5022 Chronic systolic (congestive) heart failure: Secondary | ICD-10-CM

## 2022-05-21 NOTE — Patient Instructions (Addendum)
Diabetes- will get a1c within a week. Please get scheduled. Metformin 500 mg 1/2 tab daily and farxiga 10 mg daily.    Htn- bp very well controlled today. Check bp daily and if bp lower than today or if getting dizziniss on standing let us know.  Chf- continue  coreg 6.25 mg bid, spironolactone 25 mg 1/2 tab, daily,  aspirin 81 mg daily and entresto daily follow up with cardiologist as regularly scheduled.  For high cholesterol continue atorvastatin and get future cmp and lipid panel within a week.  Follow up in 3 months or sooner if needed based on lab work. Labs within one week. Fasting early moring cmp, lipid and a1c.

## 2022-05-21 NOTE — Progress Notes (Signed)
Subjective:    Patient ID: Raymond [name], male    DOB: 02-01-1951, 71 y.o.   MRN: [id number]  HPI  Pt in for first time. 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   Pt has hx of cabg in March 2023.  Pt on plavix.  High cholesterol- atorvastatin 40 mg daily.  Diabetes- mefformin 500 mg 1/2 tab daily and farxiga 10 mg daily. On review no recent a1c.   Hx of htn and chf. Pt is on coreg 6.25 mg bid, spironolactone 25 mg 1/2 tab, daily,  aspirin 81 mg daily and entresto daily. Pt primary cardiologist is Dr. Brynda Peon.   On review no chest pain and no shortness of breath.     Review of Systems  Constitutional:  Negative for chills, fatigue and fever.  HENT:  Negative for congestion, ear pain and facial swelling.   Respiratory:  Negative for cough, chest tightness, shortness of breath and wheezing.   Cardiovascular:  Negative for chest pain and palpitations.  Gastrointestinal:  Negative for abdominal pain, constipation, diarrhea and nausea.  Genitourinary:  Negative for dysuria and frequency.  Musculoskeletal:  Negative for back pain, myalgias, neck pain and neck stiffness.  Skin:  Negative for rash.  Neurological:  Negative for dizziness, speech difficulty, weakness, light-headedness and numbness.  Hematological:  Negative for adenopathy. Does not bruise/bleed easily.  Psychiatric/Behavioral:  Negative for behavioral problems, confusion, dysphoric mood and hallucinations. The patient is not nervous/anxious.     Past Medical History:  Diagnosis Date   Arthritis    Depression    Diabetes mellitus type 2 in obese (HCC)    Hypertension    Migraines    Sleep apnea    Stroke [facility])      Social History   Socioeconomic History   Marital status: Married    Spouse name: [name]   Number of children: Not on file   Years of education: Not on file   Highest education level: Bachelor's degree (e.g., BA, AB, BS)  Occupational History   Occupation: retired     Comment: Charity fundraiser  Tobacco Use   Smoking status: Former    Types: Cigars    Quit date: 01/17/2022    Years since quitting: 0.3   Smokeless tobacco: Never   Tobacco comments:    chew on cigars its not lit  Substance and Sexual Activity   Alcohol use: No    Alcohol/week: 0.0 standard drinks of alcohol   Drug use: No   Sexual activity: Not Currently  Other Topics Concern   Not on file  Social History Narrative   Not on file   Social Determinants of Health   Financial Resource Strain: Low Risk  (01/07/2022)   Overall Financial Resource Strain (CARDIA)    Difficulty of Paying Living Expenses: Not very hard  Food Insecurity: No Food Insecurity (01/07/2022)   Hunger Vital Sign    Worried About Running Out of Food in the Last Year: Never true    Ran Out of Food in the Last Year: Never true  Transportation Needs: No Transportation Needs (01/07/2022)   PRAPARE - Administrator, Civil Service (Medical): No    Lack of Transportation (Non-Medical): No  Physical Activity: Not on file  Stress: Not on file  Social Connections: Not on file  Intimate Partner Violence: Not on file    Past Surgical History:  Procedure Laterality Date   CHOLECYSTECTOMY     CORONARY ANGIOGRAPHY N/A 01/07/2022  Procedure: CORONARY ANGIOGRAPHY;  Surgeon: Runell Gess, MD;  Location: [facility];  Service: Cardiovascular;  Laterality: N/A;   CORONARY ARTERY BYPASS GRAFT N/A 01/08/2022   Procedure: CORONARY ARTERY BYPASS GRAFTING (CABG) TIMES THREE USING LEFT INTERNAL MAMMARY ARTERY AND ENDOSCOPICALLY HARVESTED BILATERAL GREATER SAPHENOUS VEINS;  Surgeon: Corliss Skains, MD;  Location: [facility];  Service: Open Heart Surgery;  Laterality: N/A;   ENDOVEIN HARVEST OF GREATER SAPHENOUS VEIN Right 01/08/2022   Procedure: ENDOVEIN HARVEST OF GREATER SAPHENOUS VEIN;  Surgeon: Corliss Skains, MD;  Location: [facility] OR;  Service: Open Heart Surgery;  Laterality: Right;   KNEE ARTHROSCOPY     LOOP RECORDER  INSERTION N/A 06/24/2017   Procedure: LOOP RECORDER INSERTION;  Surgeon: Hillis Range, MD;  Location: [facility]AS[facility];  Service: Cardiovascular;  Laterality: N/A;   ROTATOR CUFF REPAIR     TEE WITHOUT CARDIOVERSION N/A 06/24/2017   Procedure: TRANSESOPHAGEAL ECHOCARDIOGRAM (TEE) WITH LOOP;  Surgeon: Elease Hashimoto Deloris Ping, MD;  Location: [facility] ENDOSCOPY;  Service: Cardiovascular;  Laterality: N/A;   TEE WITHOUT CARDIOVERSION N/A 01/08/2022   Procedure: TRANSESOPHAGEAL ECHOCARDIOGRAM (TEE);  Surgeon: Corliss Skains, MD;  Location: [facility] OR;  Service: Open Heart Surgery;  Laterality: N/A;    Family History  Problem Relation Age of Onset   Other Mother        reports in good health   Other Father        was estranged from family, unknown medical history    Allergies  Allergen Reactions   Flomax [Tamsulosin Hcl] Other (See Comments)    HYPOtension and syncope    Current Outpatient Medications on File Prior to Visit  Medication Sig Dispense Refill   aspirin EC 81 MG EC tablet Take 1 tablet (81 mg total) by mouth daily. Swallow whole. 30 tablet 11   atorvastatin (LIPITOR) 40 MG tablet Take 1 tablet (40 mg total) by mouth daily at 6 PM. 30 tablet 3   carvedilol (COREG) 6.25 MG tablet Take 1 tablet (6.25 mg total) by mouth 2 (two) times daily with a meal. 60 tablet 3   clopidogrel (PLAVIX) 75 MG tablet TAKE 1 TABLET BY MOUTH EVERY DAY 30 tablet 3   dapagliflozin propanediol (FARXIGA) 10 MG TABS tablet Take 1 tablet (10 mg total) by mouth daily before breakfast. 30 tablet 11   levocetirizine (XYZAL) 5 MG tablet Take 5 mg by mouth daily as needed for allergies.     metFORMIN (GLUCOPHAGE) 500 MG tablet Take 1 tablet (500 mg total) by mouth at bedtime. (Patient taking differently: Take 500 mg by mouth 2 (two) times daily with a meal.)     sacubitril-valsartan (ENTRESTO) 97-103 MG Take 1 tablet by mouth 2 (two) times daily. 180 tablet 3   sertraline (ZOLOFT) 100 MG tablet Take 100 mg by mouth daily.       spironolactone (ALDACTONE) 25 MG tablet Take 0.5 tablets (12.5 mg total) by mouth daily. 45 tablet 3   No current facility-administered medications on file prior to visit.    BP (!) 109/54   Pulse (!) 54   Resp 18   Ht 5\' 4"  (1.626 m)   Wt 222 lb 6.4 oz (100.9 kg)   SpO2 97%   BMI 38.17 kg/m        Objective:   Physical Exam  General Mental Status- Alert. General Appearance- Not in acute distress.   Skin General: Color- Normal Color. Moisture- Normal Moisture.  Neck Carotid Arteries- Normal color. Moisture-  Normal Moisture. No carotid bruits. No JVD.  Chest and Lung Exam Auscultation: Breath Sounds:-Normal.  Cardiovascular Auscultation:Rythm- Regular. Murmurs & Other Heart Sounds:Auscultation of the heart reveals- No Murmurs.  Abdomen Inspection:-Inspeection Normal. Palpation/Percussion:Note:No mass. Palpation and Percussion of the abdomen reveal- Non Tender, Non Distended + BS, no rebound or guarding.    Neurologic Cranial Nerve exam:- CN III-XII intact(No nystagmus), symmetric smile. Drift Test:- No drift. Romberg Exam:- Negative.  Heal to Toe Gait exam:-Normal. Finger to Nose:- Normal/Intact Strength:- 5/5 equal and symmetric strength both upper and lower extremities.    Lower ext-no pedal edema. Calfs symmertric.     Assessment & Plan:   Patient Instructions  Diabetes- will get a1c within a week. Please get scheduled. Metformin 500 mg 1/2 tab daily and farxiga 10 mg daily.    Htn- bp very well controlled today. Check bp daily and if bp lower than today or if getting dizziniss on standing let us know.  Chf- continue  coreg 6.25 mg bid, spironolactone 25 mg 1/2 tab, daily,  aspirin 81 mg daily and entresto daily follow up with cardiologist as regularly scheduled.  For high cholesterol continue atorvastatin and get future cmp and lipid panel within a week.  Follow up in 3 months or sooner if needed based on lab work.        Esperanza Richters,  PA-C

## 2022-05-22 MED ORDER — ATORVASTATIN CALCIUM 40 MG PO TABS: 40.0000 mg | ORAL_TABLET | Freq: Every day | ORAL | 3 refills | Status: DC

## 2022-05-22 MED ORDER — CARVEDILOL 6.25 MG PO TABS: 6.2500 mg | ORAL_TABLET | Freq: Two times a day (BID) | ORAL | 3 refills | Status: DC

## 2022-05-22 NOTE — Addendum Note (Signed)
Addended by: Bernita Buffy on: 05/22/2022 01:49 PM   Modules accepted: Orders

## 2022-05-27 ENCOUNTER — Other Ambulatory Visit (INDEPENDENT_AMBULATORY_CARE_PROVIDER_SITE_OTHER): Payer: Medicare Other

## 2022-05-27 DIAGNOSIS — I2510 Atherosclerotic heart disease of native coronary artery without angina pectoris: Secondary | ICD-10-CM

## 2022-05-27 DIAGNOSIS — E118 Type 2 diabetes mellitus with unspecified complications: Secondary | ICD-10-CM

## 2022-05-27 DIAGNOSIS — I5022 Chronic systolic (congestive) heart failure: Secondary | ICD-10-CM | POA: Diagnosis not present

## 2022-05-27 LAB — COMPREHENSIVE METABOLIC PANEL
ALT: 11 U/L (ref 0–53)
AST: 15 U/L (ref 0–37)
Albumin: 4.1 g/dL (ref 3.5–5.2)
Alkaline Phosphatase: 34 U/L — ABNORMAL LOW (ref 39–117)
BUN: 17 mg/dL (ref 6–23)
CO2: 28 mEq/L (ref 19–32)
Calcium: 8.8 mg/dL (ref 8.4–10.5)
Chloride: 105 mEq/L (ref 96–112)
Creatinine, Ser: 1.37 mg/dL (ref 0.40–1.50)
GFR: 51.5 mL/min — ABNORMAL LOW (ref >60.00)
Glucose, Bld: 142 mg/dL — ABNORMAL HIGH (ref 70–99)
Potassium: 4.1 mEq/L (ref 3.5–5.1)
Sodium: 141 mEq/L (ref 135–145)
Total Bilirubin: 0.7 mg/dL (ref 0.2–1.2)
Total Protein: 5.9 g/dL — ABNORMAL LOW (ref 6.0–8.3)

## 2022-05-27 LAB — LIPID PANEL
Cholesterol: 111 mg/dL (ref 0–200)
HDL: 35.1 mg/dL — ABNORMAL LOW (ref >39.00)
LDL Cholesterol: 57 mg/dL (ref 0–99)
NonHDL: 76.2
Total CHOL/HDL Ratio: 3
Triglycerides: 98 mg/dL (ref 0.0–149.0)
VLDL: 19.6 mg/dL (ref 0.0–40.0)

## 2022-05-27 LAB — BRAIN NATRIURETIC PEPTIDE: Pro B Natriuretic peptide (BNP): 34 pg/mL (ref 0.0–100.0)

## 2022-05-27 LAB — HEMOGLOBIN A1C: Hgb A1c MFr Bld: 6.6 % — ABNORMAL HIGH (ref 4.6–6.5)

## 2022-06-24 ENCOUNTER — Encounter: Payer: Self-pay | Admitting: *Deleted

## 2022-06-24 NOTE — Progress Notes (Unsigned)
PATIENT: Raymond [name] DOB: January 16, 1951  REASON FOR VISIT: follow up HISTORY FROM: patient PRIMARY NEUROLOGIST:   Virtual Visit via Video Note  I connected with Darrel Reach on 06/25/22 at  2:00 PM EDT by a video enabled telemedicine application located remotely at [facility] and verified that I am speaking with the correct person using two identifiers who was located at their own home.   I discussed the limitations of evaluation and management by telemedicine and the availability of in person appointments. The patient expressed understanding and agreed to proceed.   PATIENT: Raymond [name] DOB: 11-11-1950  REASON FOR VISIT: follow up HISTORY FROM: patient  HISTORY OF PRESENT ILLNESS: Today 06/25/22:  Mr. Hartunian is a 71 year old male with a history of obstructive sleep apnea on CPAP.  He reports that his CPAP continues to work well for him.  He continues to notice the benefit.  His download is below   07/03/21: Mr. Macbride is a 71 year old male with a history of obstructive sleep apnea on CPAP.  He returns today for follow-up.  He reports that the CPAP is working well.  He denies any new issues.  He returns today for an evaluation.    06/27/20: Mr. Bonnici is a 71 year old male with a history of obstructive sleep apnea on CPAP.  His download indicates that he uses machine nightly for compliance of 100%.  He uses machine greater than 4 hours 29 out of 30 days for compliance of 97%.  On average he uses his machine 10 hours and 11 minutes.  His residual AHI is 0.9 on 9 cm of water with EPR of 1.  Leak in the 95th percentile is 0.  HISTORY 06/24/19:   Mr. Chilson is a 71 year old male with a history of obstructive sleep apnea on CPAP.  He returns today for follow-up.  His download indicates that he uses machine nightly for compliance of 100%.  He uses machine greater than 4 hours 29 days for compliance of 97%.  On average he uses his machine 9 hours and 50 minutes.   His residual AHI is 0.8 on 9 cm of water with EPR of 1.  His leak in the 95th percentile is 2.  Overall he reports that he is doing well.  He denies any new issues.  He returns today for evaluation.    REVIEW OF SYSTEMS: Out of a complete 14 system review of symptoms, the patient complains only of the following symptoms, and all other reviewed systems are negative.  ALLERGIES: Allergies  Allergen Reactions   Flomax [Tamsulosin Hcl] Other (See Comments)    HYPOtension and syncope    HOME MEDICATIONS: Outpatient Medications Prior to Visit  Medication Sig Dispense Refill   aspirin EC 81 MG EC tablet Take 1 tablet (81 mg total) by mouth daily. Swallow whole. 30 tablet 11   atorvastatin (LIPITOR) 40 MG tablet Take 1 tablet (40 mg total) by mouth daily at 6 PM. 90 tablet 3   carvedilol (COREG) 6.25 MG tablet Take 1 tablet (6.25 mg total) by mouth 2 (two) times daily with a meal. 180 tablet 3   clopidogrel (PLAVIX) 75 MG tablet TAKE 1 TABLET BY MOUTH EVERY DAY 30 tablet 3   dapagliflozin propanediol (FARXIGA) 10 MG TABS tablet Take 1 tablet (10 mg total) by mouth daily before breakfast. 30 tablet 11   levocetirizine (XYZAL) 5 MG tablet Take 5 mg by mouth daily as needed for allergies.  metFORMIN (GLUCOPHAGE) 500 MG tablet Take 1 tablet (500 mg total) by mouth at bedtime. (Patient taking differently: Take 500 mg by mouth 2 (two) times daily with a meal.)     sacubitril-valsartan (ENTRESTO) 97-103 MG Take 1 tablet by mouth 2 (two) times daily. 180 tablet 3   sertraline (ZOLOFT) 100 MG tablet Take 100 mg by mouth daily.      spironolactone (ALDACTONE) 25 MG tablet Take 0.5 tablets (12.5 mg total) by mouth daily. 45 tablet 3   No facility-administered medications prior to visit.    PAST MEDICAL HISTORY: Past Medical History:  Diagnosis Date   Arthritis    Depression    Diabetes mellitus type 2 in obese (HCC)    Hypertension    Migraines    Sleep apnea    Stroke [facility])     PAST  SURGICAL HISTORY: Past Surgical History:  Procedure Laterality Date   CHOLECYSTECTOMY     CORONARY ANGIOGRAPHY N/A 01/07/2022   Procedure: CORONARY ANGIOGRAPHY;  Surgeon: Runell Gess, MD;  Location: [facility] INVASIVE CV LAB;  Service: Cardiovascular;  Laterality: N/A;   CORONARY ARTERY BYPASS GRAFT N/A 01/08/2022   Procedure: CORONARY ARTERY BYPASS GRAFTING (CABG) TIMES THREE USING LEFT INTERNAL MAMMARY ARTERY AND ENDOSCOPICALLY HARVESTED BILATERAL GREATER SAPHENOUS VEINS;  Surgeon: Corliss Skains, MD;  Location: [facility] OR;  Service: Open Heart Surgery;  Laterality: N/A;   ENDOVEIN HARVEST OF GREATER SAPHENOUS VEIN Right 01/08/2022   Procedure: ENDOVEIN HARVEST OF GREATER SAPHENOUS VEIN;  Surgeon: Corliss Skains, MD;  Location: [facility] OR;  Service: Open Heart Surgery;  Laterality: Right;   KNEE ARTHROSCOPY     LOOP RECORDER INSERTION N/A 06/24/2017   Procedure: LOOP RECORDER INSERTION;  Surgeon: Hillis Range, MD;  Location: [facility] CV [facility];  Service: Cardiovascular;  Laterality: N/A;   ROTATOR CUFF REPAIR     TEE WITHOUT CARDIOVERSION N/A 06/24/2017   Procedure: TRANSESOPHAGEAL ECHOCARDIOGRAM (TEE) WITH LOOP;  Surgeon: Elease Hashimoto Deloris Ping, MD;  Location: [facility] ENDOSCOPY;  Service: Cardiovascular;  Laterality: N/A;   TEE WITHOUT CARDIOVERSION N/A 01/08/2022   Procedure: TRANSESOPHAGEAL ECHOCARDIOGRAM (TEE);  Surgeon: Corliss Skains, MD;  Location: [facility] OR;  Service: Open Heart Surgery;  Laterality: N/A;    FAMILY HISTORY: Family History  Problem Relation Age of Onset   Other Mother        reports in good health   Other Father        was estranged from family, unknown medical history    SOCIAL HISTORY: Social History   Socioeconomic History   Marital status: Married    Spouse name: [name]   Number of children: Not on file   Years of education: Not on file   Highest education level: Bachelor's degree (e.g., BA, AB, BS)  Occupational History   Occupation: retired    Comment: Charity fundraiser   Tobacco Use   Smoking status: Former    Types: Cigars    Quit date: 01/17/2022    Years since quitting: 0.4   Smokeless tobacco: Never   Tobacco comments:    chew on cigars its not lit  Substance and Sexual Activity   Alcohol use: No    Alcohol/week: 0.0 standard drinks of alcohol   Drug use: No   Sexual activity: Not Currently  Other Topics Concern   Not on file  Social History Narrative   Not on file   Social Determinants of Health   Financial Resource Strain: Low Risk  (01/07/2022)   Overall Physicist, medical Strain (  CARDIA)    Difficulty of Paying Living Expenses: Not very hard  Food Insecurity: No Food Insecurity (01/07/2022)   Hunger Vital Sign    Worried About Running Out of Food in the Last Year: Never true    Ran Out of Food in the Last Year: Never true  Transportation Needs: No Transportation Needs (01/07/2022)   PRAPARE - Administrator, Civil Service (Medical): No    Lack of Transportation (Non-Medical): No  Physical Activity: Not on file  Stress: Not on file  Social Connections: Not on file  Intimate Partner Violence: Not on file      PHYSICAL EXAM Generalized: Well developed, in no acute distress   Neurological examination  Mentation: Alert oriented to time, place, history taking. Follows all commands speech and language fluent Cranial nerve II-XII:Extraocular movements were full. Facial symmetry noted. uvula tongue midline. Head turning and shoulder shrug  were normal and symmetric. Motor: Good strength throughout subjectively per patient   DIAGNOSTIC DATA (LABS, IMAGING, TESTING) - I reviewed patient records, labs, notes, testing and imaging myself where available.  Lab Results  Component Value Date   WBC 8.1 01/21/2022   HGB 12.3 (L) 01/21/2022   HCT 35.7 (L) 01/21/2022   MCV 87.1 01/21/2022   PLT 300 01/21/2022      Component Value Date/Time   NA 141 05/27/2022 0753   NA 144 02/08/2022 1110   K 4.1 05/27/2022 0753   CL 105  05/27/2022 0753   CO2 28 05/27/2022 0753   GLUCOSE 142 (H) 05/27/2022 0753   BUN 17 05/27/2022 0753   BUN 20 02/08/2022 1110   CREATININE 1.37 05/27/2022 0753   CALCIUM 8.8 05/27/2022 0753   PROT 5.9 (L) 05/27/2022 0753   PROT 6.1 10/09/2015 0000   PROT 6.0 10/09/2015 0000   ALBUMIN 4.1 05/27/2022 0753   ALBUMIN 4.0 10/09/2015 0000   ALBUMIN 4.2 10/09/2015 0000   AST 15 05/27/2022 0753   ALT 11 05/27/2022 0753   ALKPHOS 34 (L) 05/27/2022 0753   BILITOT 0.7 05/27/2022 0753   BILITOT 0.5 10/09/2015 0000   BILITOT 0.4 10/09/2015 0000   GFRNONAA >60 01/21/2022 1453   GFRAA >60 07/10/2017 1246   Lab Results  Component Value Date   CHOL 111 05/27/2022   HDL 35.10 (L) 05/27/2022   LDLCALC 57 05/27/2022   TRIG 98.0 05/27/2022   CHOLHDL 3 05/27/2022   Lab Results  Component Value Date   HGBA1C 6.6 (H) 05/27/2022   Lab Results  Component Value Date   VITAMINB12 265 07/11/2017   Lab Results  Component Value Date   TSH 0.643 07/11/2017      ASSESSMENT AND PLAN 71 y.o. year old male  has a past medical history of Arthritis, Depression, Diabetes mellitus type 2 in obese (HCC), Hypertension, Migraines, Sleep apnea, and Stroke (HCC). here with:  OSA on CPAP  CPAP compliance excellent Residual AHI is good Encouraged patient to continue using CPAP nightly and > 4 hours each night F/U in 1 year or sooner if needed   Butch Penny, MSN, NP-C 06/25/2022, 1:54 PM [facility] [address], [address] [place], [place] [address] [phone]

## 2022-06-25 ENCOUNTER — Telehealth (INDEPENDENT_AMBULATORY_CARE_PROVIDER_SITE_OTHER): Payer: Medicare Other | Admitting: Adult Health

## 2022-06-25 DIAGNOSIS — Z9989 Dependence on other enabling machines and devices: Secondary | ICD-10-CM | POA: Diagnosis not present

## 2022-06-25 DIAGNOSIS — G4733 Obstructive sleep apnea (adult) (pediatric): Secondary | ICD-10-CM | POA: Diagnosis not present

## 2022-07-23 ENCOUNTER — Ambulatory Visit: Payer: Medicare Other

## 2022-08-05 ENCOUNTER — Ambulatory Visit (INDEPENDENT_AMBULATORY_CARE_PROVIDER_SITE_OTHER): Payer: Medicare Other

## 2022-08-05 VITALS — Ht 64.0 in | Wt 222.0 lb

## 2022-08-05 DIAGNOSIS — Z0000 Encounter for general adult medical examination without abnormal findings: Secondary | ICD-10-CM | POA: Diagnosis not present

## 2022-08-05 NOTE — Progress Notes (Addendum)
Subjective:   Raymond ReachRoger D Sieloff is a 71 y.o. male who presents for an Initial Medicare Annual Wellness Visit.  I connected with Raymond CoveRoger today by telephone and verified that I am speaking with the correct person using two identifiers. Location patient: home Location provider: work Persons participating in the virtual visit: patient, Engineer, civil (consulting)nurse.    I discussed the limitations, risks, security and privacy concerns of performing an evaluation and management service by telephone and the availability of in person appointments. I also discussed with the patient that there may be a patient responsible charge related to this service. The patient expressed understanding and verbally consented to this telephonic visit.    Interactive audio and video telecommunications were attempted between this provider and patient, however failed, due to patient having technical difficulties OR patient did not have access to video capability.  We continued and completed visit with audio only.  Some vital signs may be absent or patient reported.   Time Spent with patient on telephone encounter: 30 minutes   Review of Systems     Cardiac Risk Factors include: advanced age (>3355men, 49>65 women);male gender;hypertension;diabetes mellitus;dyslipidemia;obesity (BMI >30kg/m2)     Objective:    Today's Vitals   08/05/22 1229  Weight: 222 lb (100.7 kg)  Height: 5\' 4"  (1.626 m)   Body mass index is 38.11 kg/m.     08/05/2022   12:32 PM 01/08/2022   10:26 AM 01/05/2022   10:40 PM 07/10/2017    8:55 PM 07/10/2017   12:43 PM 06/21/2017   11:00 PM 06/21/2017    2:50 PM  Advanced Directives  Does Patient Have a Medical Advance Directive? Yes Yes Yes No No No No  Type of Estate agentAdvance Directive Healthcare Power of [place];Living will Healthcare Power of [place];Living will Healthcare Power of [place];Living will      Does patient want to make changes to medical advance directive?  No - Patient declined Yes (ED - Information included in  AVS)      Copy of Healthcare Power of Attorney in Chart?  No - copy requested No - copy requested      Would patient like information on creating a medical advance directive?    No - Patient declined  No - Patient declined No - Patient declined    Current Medications (verified) Outpatient Encounter Medications as of 08/05/2022  Medication Sig   aspirin EC 81 MG EC tablet Take 1 tablet (81 mg total) by mouth daily. Swallow whole.   atorvastatin (LIPITOR) 40 MG tablet Take 1 tablet (40 mg total) by mouth daily at 6 PM.   carvedilol (COREG) 6.25 MG tablet Take 1 tablet (6.25 mg total) by mouth 2 (two) times daily with a meal.   clopidogrel (PLAVIX) 75 MG tablet TAKE 1 TABLET BY MOUTH EVERY DAY   dapagliflozin propanediol (FARXIGA) 10 MG TABS tablet Take 1 tablet (10 mg total) by mouth daily before breakfast.   levocetirizine (XYZAL) 5 MG tablet Take 5 mg by mouth daily as needed for allergies.   metFORMIN (GLUCOPHAGE) 500 MG tablet Take 1 tablet (500 mg total) by mouth at bedtime. (Patient taking differently: Take 500 mg by mouth 2 (two) times daily with a meal.)   sacubitril-valsartan (ENTRESTO) 97-103 MG Take 1 tablet by mouth 2 (two) times daily.   sertraline (ZOLOFT) 100 MG tablet Take 100 mg by mouth daily.    spironolactone (ALDACTONE) 25 MG tablet Take 0.5 tablets (12.5 mg total) by mouth daily.   No facility-administered encounter medications  on file as of 08/05/2022.    Allergies (verified) Flomax [tamsulosin hcl]   History: Past Medical History:  Diagnosis Date   Arthritis    Depression    Diabetes mellitus type 2 in obese (HCC)    Hypertension    Migraines    Sleep apnea    Stroke [facility])    Past Surgical History:  Procedure Laterality Date   CHOLECYSTECTOMY     CORONARY ANGIOGRAPHY N/A 01/07/2022   Procedure: CORONARY ANGIOGRAPHY;  Surgeon: Runell Gess, MD;  Location: [facility] INVASIVE CV LAB;  Service: Cardiovascular;  Laterality: N/A;   CORONARY ARTERY BYPASS GRAFT N/A  01/08/2022   Procedure: CORONARY ARTERY BYPASS GRAFTING (CABG) TIMES THREE USING LEFT INTERNAL MAMMARY ARTERY AND ENDOSCOPICALLY HARVESTED BILATERAL GREATER SAPHENOUS VEINS;  Surgeon: Corliss Skains, MD;  Location: [facility] OR;  Service: Open Heart Surgery;  Laterality: N/A;   ENDOVEIN HARVEST OF GREATER SAPHENOUS VEIN Right 01/08/2022   Procedure: ENDOVEIN HARVEST OF GREATER SAPHENOUS VEIN;  Surgeon: Corliss Skains, MD;  Location: [facility] OR;  Service: Open Heart Surgery;  Laterality: Right;   KNEE ARTHROSCOPY     LOOP RECORDER INSERTION N/A 06/24/2017   Procedure: LOOP RECORDER INSERTION;  Surgeon: Hillis Range, MD;  Location: [facility] INVASIVE CV LAB;  Service: Cardiovascular;  Laterality: N/A;   ROTATOR CUFF REPAIR     TEE WITHOUT CARDIOVERSION N/A 06/24/2017   Procedure: TRANSESOPHAGEAL ECHOCARDIOGRAM (TEE) WITH LOOP;  Surgeon: Elease Hashimoto Deloris Ping, MD;  Location: [facility] ENDOSCOPY;  Service: Cardiovascular;  Laterality: N/A;   TEE WITHOUT CARDIOVERSION N/A 01/08/2022   Procedure: TRANSESOPHAGEAL ECHOCARDIOGRAM (TEE);  Surgeon: Corliss Skains, MD;  Location[facility];  Service: Open Heart Surgery;  Laterality: N/A;   Family History  Problem Relation Age of Onset   Other Mother        reports in good health   Other Father        was estranged from family, unknown medical history   Social History   Socioeconomic History   Marital status: Married    Spouse name: Raymond [name]   Number of children: Not on file   Years of education: Not on file   Highest education level: Bachelor's degree (e.g., BA, AB, BS)  Occupational History   Occupation: retired    Comment: Charity fundraiser  Tobacco Use   Smoking status: Former    Types: Cigars    Quit date: 01/17/2022    Years since quitting: 0.5   Smokeless tobacco: Never   Tobacco comments:    chew on cigars its not lit  Substance and Sexual Activity   Alcohol use: No    Alcohol/week: 0.0 standard drinks of alcohol   Drug use: No   Sexual activity: Not Currently  Other  Topics Concern   Not on file  Social History Narrative   Not on file   Social Determinants of Health   Financial Resource Strain: Low Risk  (01/07/2022)   Overall Financial Resource Strain (CARDIA)    Difficulty of Paying Living Expenses: Not very hard  Food Insecurity: No Food Insecurity (08/05/2022)   Hunger Vital Sign    Worried About Running Out of Food in the Last Year: Never true    Ran Out of Food in the Last Year: Never true  Transportation Needs: No Transportation Needs (08/05/2022)   PRAPARE - Administrator, Civil Service (Medical): No    Lack of Transportation (Non-Medical): No  Physical Activity: Insufficiently Active (08/05/2022)   Exercise Vital Sign  Days of Exercise per Week: 7 days    Minutes of Exercise per Session: 20 min  Stress: No Stress Concern Present (08/05/2022)   [employer] of Occupational Health - Occupational Stress Questionnaire    Feeling of Stress : Not at all  Social Connections: Moderately Isolated (08/05/2022)   Social Connection and Isolation Panel [NHANES]    Frequency of Communication with Friends and Family: More than three times a week    Frequency of Social Gatherings with Friends and Family: More than three times a week    Attends Religious Services: Never    Database administrator or Organizations: No    Attends Engineer, structural: Never    Marital Status: Married    Tobacco Counseling Counseling given: Not Answered Tobacco comments: chew on cigars its not lit   Clinical Intake:  Pre-visit preparation completed: Yes  Pain : No/denies pain     BMI - recorded: 38.11 Nutritional Status: BMI > 30  Obese Nutritional Risks: None Diabetes: Yes CBG done?: No Did pt. bring in CBG monitor from home?: No (phone visit)  How often do you need to have someone help you when you read instructions, pamphlets, or other written materials from your doctor or pharmacy?: 1 - Never  Diabetic?Yes  Diabetes:  Is  the patient diabetic?  Yes  If diabetic, was a CBG obtained today?  No  Did the patient bring in their glucometer from home?  No phone visit How often do you monitor your CBG's? weekly.   Financial Strains and Diabetes Management:  Are you having any financial strains with the device, your supplies or your medication? No .  Does the patient want to be seen by Chronic Care Management for management of their diabetes?  No  Would the patient like to be referred to a Nutritionist or for Diabetic Management?  No   Diabetic Exams:  Diabetic Eye Exam: . Overdue for diabetic eye exam. Pt has been advised about the importance in completing this exam.   Diabetic Foot Exam: Pt has been advised about the importance in completing this exam. To be completed by PCP   Interpreter Needed?: No  Information entered by :: Thomasenia Sales LPN   Activities of Daily Living    08/05/2022   12:36 PM 01/05/2022   10:49 PM  In your present state of health, do you have any difficulty performing the following activities:  Hearing? 0   Vision? 0   Difficulty concentrating or making decisions? 1   Comment occasionally   Walking or climbing stairs? 0   Dressing or bathing? 0   Doing errands, shopping? 0 1  Preparing Food and eating ? N   Using the Toilet? N   In the past six months, have you accidently leaked urine? Y   Do you have problems with loss of bowel control? N   Managing your Medications? N   Managing your Finances? N   Housekeeping or managing your Housekeeping? N     Patient Care Team: Saguier, Kateri Mc as PCP - General (Internal Medicine) Thurmon Fair, MD as PCP - Cardiology (Cardiology)  Indicate any recent Medical Services you may have received from other than [facility] providers in the past year (date may be approximate).     Assessment:   This is a routine wellness examination for Raymond [name].  Hearing/Vision screen Hearing Screening - Comments:: Bilateral hearing aids Vision  Screening - Comments:: Last eye exam-2-3 years ago  Dietary issues and exercise activities  discussed: Current Exercise Habits: Home exercise routine, Type of exercise: walking, Time (Minutes): 15, Frequency (Times/Week): 7, Weekly Exercise (Minutes/Week): 105, Intensity: Mild, Exercise limited by: None identified   Goals Addressed             This Visit's Progress    Patient Stated       Lose a little more weight       Depression Screen    08/05/2022   12:35 PM 05/21/2022   11:02 AM  PHQ 2/9 Scores  PHQ - 2 Score 0 0    Fall Risk    08/05/2022   12:34 PM 05/21/2022   11:02 AM 08/04/2018    8:24 AM  [place] in the past year? 0 0 No  Number falls in past yr: 0 0   Injury with Fall? 0 0   Follow up Falls prevention discussed Falls evaluation completed     [place]:  Any stairs in or around the home? Yes  If so, are there any without handrails? No  Home free of loose throw rugs in walkways, pet beds, electrical cords, etc? No pt aware of falls risk Adequate lighting in your home to reduce risk of falls? Yes   ASSISTIVE DEVICES UTILIZED TO PREVENT FALLS:  Life alert? No  Use of a cane, walker or w/c? No  Grab bars in the bathroom? Yes  Shower chair or bench in shower? Yes  Elevated toilet seat or a handicapped toilet? No   TIMED UP AND GO:  Was the test performed? No . Phone visit   Cognitive Function:        08/05/2022   12:41 PM  6CIT Screen  What Year? 0 points  What month? 0 points  What time? 0 points  Count back from 20 0 points  Months in reverse 0 points  Repeat phrase 0 points  Total Score 0 points    Immunizations  There is no immunization history on file for this patient.  TDAP status: Up to date-per patient-awaiting notes from previous PCP.  Flu Vaccine status: Due, Education has been provided regarding the importance of this vaccine. Advised may receive this vaccine at local pharmacy or  Health Dept. Aware to provide a copy of the vaccination record if obtained from local pharmacy or Health Dept. Verbalized acceptance and understanding.  Pneumococcal vaccine status: Up to date-per patient-awaiting notes from previous PCP.  Covid-19 vaccine status: Information provided on how to obtain vaccines.   Qualifies for Shingles Vaccine? Completed vaccines-per patient-awaiting notes from previous PCP.  Screening Tests Health Maintenance  Topic Date Due   COVID-19 Vaccine (1) Never done   FOOT EXAM  Never done   OPHTHALMOLOGY EXAM  Never done   Diabetic kidney evaluation - Urine ACR  Never done   Hepatitis C Screening  Never done   TETANUS/TDAP  Never done   COLONOSCOPY (Pts 45-14yrs Insurance coverage will need to be confirmed)  Never done   Zoster Vaccines- Shingrix (1 of 2) Never done   Pneumonia Vaccine 35+ Years old (1 - PCV) Never done   INFLUENZA VACCINE  Never done   HEMOGLOBIN A1C  11/27/2022   Diabetic kidney evaluation - GFR measurement  05/28/2023   HPV VACCINES  Aged Out    Health Maintenance  Health Maintenance Due  Topic Date Due   COVID-19 Vaccine (1) Never done   FOOT EXAM  Never done   OPHTHALMOLOGY EXAM  Never done  Diabetic kidney evaluation - Urine ACR  Never done   Hepatitis C Screening  Never done   TETANUS/TDAP  Never done   COLONOSCOPY (Pts 45-16yrs Insurance coverage will need to be confirmed)  Never done   Zoster Vaccines- Shingrix (1 of 2) Never done   Pneumonia Vaccine 13+ Years old (1 - PCV) Never done   INFLUENZA VACCINE  Never done    Colorectal cancer screening: Type of screening: Colonoscopy. Completed 3 years ago per patient. Repeat every 10 years. Awaiting records from previous PCP.  Lung Cancer Screening: (Low Dose CT Chest recommended if Age 46-80 years, 30 pack-year currently smoking OR have quit w/in 15years.) does not qualify.     Additional Screening:  Hepatitis C Screening: does qualify; Patient to discuss with  PCP  Vision Screening: Recommended annual ophthalmology exams for early detection of glaucoma and other disorders of the eye. Is the patient up to date with their annual eye exam?  No  Who is the provider or what is the name of the office in which the patient attends annual eye exams? [facility]   Dental Screening: Recommended annual dental exams for proper oral hygiene  Community Resource Referral / Chronic Care Management: CRR required this visit?  No   CCM required this visit?  No      Plan:     I have personally reviewed and noted the following in the patient's chart:   Medical and social history Use of alcohol, tobacco or illicit drugs  Current medications and supplements including opioid prescriptions. Patient is not currently taking opioid prescriptions. Functional ability and status Nutritional status Physical activity Advanced directives List of other physicians Hospitalizations, surgeries, and ER visits in previous 12 months Vitals Screenings to include cognitive, depression, and falls Referrals and appointments  In addition, I have reviewed and discussed with patient certain preventive protocols, quality metrics, and best practice recommendations. A written personalized care plan for preventive services as well as general preventive health recommendations were provided to patient.   Due to this being a telephonic visit, the after visit summary with patients personalized plan was offered to patient via mail or my-chart. Patient would like to access on my-chart.  Roanna Raider, LPN   [id number]/12/[id number]  Nurse Health Advisor  Nurse Notes: None  Review and Agree with assessment & plan of LPN   Esperanza Richters, PA-C

## 2022-08-05 NOTE — Patient Instructions (Signed)
[name] , Thank you for taking time to complete your Medicare Wellness Visit. I appreciate your ongoing commitment to your health goals. Please review the following plan we discussed and let me know if I can assist you in the future.   Screening recommendations/referrals: Colonoscopy: Per our conversation, completed 2-3 years ago. No report in chart Recommended yearly ophthalmology/optometry visit for glaucoma screening and checkup Recommended yearly dental visit for hygiene and checkup  Vaccinations: Influenza vaccine: Due-May obtain vaccine at our office or your local pharmacy. Pneumococcal vaccine: Per our conversation-Up to date Tdap vaccine: Per our conversation-Up to date Shingles vaccine: Per our conversation-Up to date   Covid-19: Per our conversation-Up to date  Advanced directives: Please bring a copy of Living Will and/or Healthcare Power of Attorney for your chart.   Conditions/risks identified: See  problem list  Next appointment: Follow up in one year for your annual wellness visit.   Preventive Care 73 Years and Older, Male Preventive care refers to lifestyle choices and visits with your health care provider that can promote health and wellness. What does preventive care include? A yearly physical exam. This is also called an annual well check. Dental exams once or twice a year. Routine eye exams. Ask your health care provider how often you should have your eyes checked. Personal lifestyle choices, including: Daily care of your teeth and gums. Regular physical activity. Eating a healthy diet. Avoiding tobacco and drug use. Limiting alcohol use. Practicing safe sex. Taking low doses of aspirin every day. Taking vitamin and mineral supplements as recommended by your health care provider. What happens during an annual well check? The services and screenings done by your health care provider during your annual well check will depend on your age, overall health,  lifestyle risk factors, and family history of disease. Counseling  Your health care provider may ask you questions about your: Alcohol use. Tobacco use. Drug use. Emotional well-being. Home and relationship well-being. Sexual activity. Eating habits. History of falls. Memory and ability to understand (cognition). Work and work Statistician. Screening  You may have the following tests or measurements: Height, weight, and BMI. Blood pressure. Lipid and cholesterol levels. These may be checked every 5 years, or more frequently if you are over 73 years old. Skin check. Lung cancer screening. You may have this screening every year starting at age 73 if you have a 30-pack-year history of smoking and currently smoke or have quit within the past 15 years. Fecal occult blood test (FOBT) of the stool. You may have this test every year starting at age 73. Flexible sigmoidoscopy or colonoscopy. You may have a sigmoidoscopy every 5 years or a colonoscopy every 10 years starting at age 73. Prostate cancer screening. Recommendations will vary depending on your family history and other risks. Hepatitis C blood test. Hepatitis B blood test. Sexually transmitted disease (STD) testing. Diabetes screening. This is done by checking your blood sugar (glucose) after you have not eaten for a while (fasting). You may have this done every 1-3 years. Abdominal aortic aneurysm (AAA) screening. You may need this if you are a current or former smoker. Osteoporosis. You may be screened starting at age 73 if you are at high risk. Talk with your health care provider about your test results, treatment options, and if necessary, the need for more tests. Vaccines  Your health care provider may recommend certain vaccines, such as: Influenza vaccine. This is recommended every year. Tetanus, diphtheria, and acellular pertussis (Tdap, Td) vaccine. You  may need a Td booster every 10 years. Zoster vaccine. You may need this  after age 73. Pneumococcal 13-valent conjugate (PCV13) vaccine. One dose is recommended after age 73. Pneumococcal polysaccharide (PPSV23) vaccine. One dose is recommended after age 73. Talk to your health care provider about which screenings and vaccines you need and how often you need them. This information is not intended to replace advice given to you by your health care provider. Make sure you discuss any questions you have with your health care provider. Document Released: 11/17/2015 Document Revised: 07/10/2016 Document Reviewed: 08/22/2015 Elsevier Interactive Patient Education  2017 [employer] Prevention in the Home Falls can cause injuries. They can happen to people of all ages. There are many things you can do to make your home safe and to help prevent falls. What can I do on the outside of my home? Regularly fix the edges of walkways and driveways and fix any cracks. Remove anything that might make you trip as you walk through a door, such as a raised step or threshold. Trim any bushes or trees on the path to your home. Use bright outdoor lighting. Clear any walking paths of anything that might make someone trip, such as rocks or tools. Regularly check to see if handrails are loose or broken. Make sure that both sides of any steps have handrails. Any raised decks and porches should have guardrails on the edges. Have any leaves, snow, or ice cleared regularly. Use sand or salt on walking paths during winter. Clean up any spills in your garage right away. This includes oil or grease spills. What can I do in the bathroom? Use night lights. Install grab bars by the toilet and in the tub and shower. Do not use towel bars as grab bars. Use non-skid mats or decals in the tub or shower. If you need to sit down in the shower, use a plastic, non-slip stool. Keep the floor dry. Clean up any water that spills on the floor as soon as it happens. Remove soap buildup in the tub or  shower regularly. Attach bath mats securely with double-sided non-slip rug tape. Do not have throw rugs and other things on the floor that can make you trip. What can I do in the bedroom? Use night lights. Make sure that you have a light by your bed that is easy to reach. Do not use any sheets or blankets that are too big for your bed. They should not hang down onto the floor. Have a firm chair that has side arms. You can use this for support while you get dressed. Do not have throw rugs and other things on the floor that can make you trip. What can I do in the kitchen? Clean up any spills right away. Avoid walking on wet floors. Keep items that you use a lot in easy-to-reach places. If you need to reach something above you, use a strong step stool that has a grab bar. Keep electrical cords out of the way. Do not use floor polish or wax that makes floors slippery. If you must use wax, use non-skid floor wax. Do not have throw rugs and other things on the floor that can make you trip. What can I do with my stairs? Do not leave any items on the stairs. Make sure that there are handrails on both sides of the stairs and use them. Fix handrails that are broken or loose. Make sure that handrails are as long as the  stairways. Check any carpeting to make sure that it is firmly attached to the stairs. Fix any carpet that is loose or worn. Avoid having throw rugs at the top or bottom of the stairs. If you do have throw rugs, attach them to the floor with carpet tape. Make sure that you have a light switch at the top of the stairs and the bottom of the stairs. If you do not have them, ask someone to add them for you. What else can I do to help prevent falls? Wear shoes that: Do not have high heels. Have rubber bottoms. Are comfortable and fit you well. Are closed at the toe. Do not wear sandals. If you use a stepladder: Make sure that it is fully opened. Do not climb a closed stepladder. Make  sure that both sides of the stepladder are locked into place. Ask someone to hold it for you, if possible. Clearly mark and make sure that you can see: Any grab bars or handrails. First and last steps. Where the edge of each step is. Use tools that help you move around (mobility aids) if they are needed. These include: Canes. Walkers. Scooters. Crutches. Turn on the lights when you go into a dark area. Replace any light bulbs as soon as they burn out. Set up your furniture so you have a clear path. Avoid moving your furniture around. If any of your floors are uneven, fix them. If there are any pets around you, be aware of where they are. Review your medicines with your doctor. Some medicines can make you feel dizzy. This can increase your chance of falling. Ask your doctor what other things that you can do to help prevent falls. This information is not intended to replace advice given to you by your health care provider. Make sure you discuss any questions you have with your health care provider. Document Released: 08/17/2009 Document Revised: 03/28/2016 Document Reviewed: 11/25/2014 Elsevier Interactive Patient Education  2017 [employer].

## 2022-08-13 ENCOUNTER — Ambulatory Visit (INDEPENDENT_AMBULATORY_CARE_PROVIDER_SITE_OTHER): Payer: Medicare Other | Admitting: Medical

## 2022-08-13 VITALS — BP 124/70 | HR 50 | Temp 98.2°F | Resp 18 | Ht 64.0 in | Wt 225.0 lb

## 2022-08-13 DIAGNOSIS — Z23 Encounter for immunization: Secondary | ICD-10-CM

## 2022-08-13 DIAGNOSIS — E785 Hyperlipidemia, unspecified: Secondary | ICD-10-CM

## 2022-08-13 DIAGNOSIS — E118 Type 2 diabetes mellitus with unspecified complications: Secondary | ICD-10-CM

## 2022-08-13 DIAGNOSIS — I10 Essential (primary) hypertension: Secondary | ICD-10-CM

## 2022-08-13 DIAGNOSIS — I5022 Chronic systolic (congestive) heart failure: Secondary | ICD-10-CM

## 2022-08-13 NOTE — Patient Instructions (Addendum)
Diabetes- well controlled per last a1c. Continue Metformin 500 mg 1/2 tab daily and farxiga 10 mg daily.   Htn bp well controlled and clinically stable with chf continue coreg 6.25 mg bid, spironolactone 25 mg 1/2 tab, daily,  aspirin 81 mg daily and entresto daily. 3 lb wt gain since last visit. Watch for increasing pedal edema, dyspnea and wt gain. If that combination occurs notify us.  For high cholesterol continue atorvastatin 40 mg daily.  Future labs to be week week of Aug 27, 2022. Cmp, lipid, bnp and a1c  Follow up date to be determined after lab review.

## 2022-08-13 NOTE — Progress Notes (Signed)
Subjective:    Patient ID: Raymond [name], male    DOB: 1951/09/24, 71 y.o.   MRN: [id number]  HPI  Pt in for follow up on first visit with me in July.  Below in " A/P for that note.   "Diabetes- will get a1c within a week. Please get scheduled. Metformin 500 mg 1/2 tab daily and farxiga 10 mg daily.      Htn- bp very well controlled today. Check bp daily and if bp lower than today or if getting dizziniss on standing let us know.   Chf- continue  coreg 6.25 mg bid, spironolactone 25 mg 1/2 tab, daily,  aspirin 81 mg daily and entresto daily follow up with cardiologist as regularly scheduled."   For high cholesterol continue atorvastatin and get future cmp and lipid panel within a week."  Pt a1c was 6.6 on check in summer. Continues on metformin 500 mg 1/2 tab daily and farxiga 10 mg daily.   Htn- bp controlled today.   Chf- pt weight is up by about 3 lbs since last vist with me. But no report of sob on lying flat.  No leg swelling.  O2 sat 98%. No dyspnea on exertion. 2 months ago bnp was 34.  High cholesterol- last labs showed. Good cholesterol levels except low hdl.   Review of Systems  Constitutional:  Negative for chills, fatigue and fever.  HENT:  Negative for dental problem and drooling.   Respiratory:  Negative for cough, chest tightness, shortness of breath and wheezing.   Cardiovascular:  Negative for chest pain and palpitations.  Gastrointestinal:  Negative for abdominal pain, constipation, diarrhea and vomiting.  Endocrine: Negative for polydipsia, polyphagia and polyuria.  Genitourinary:  Negative for enuresis.  Musculoskeletal:  Negative for back pain, joint swelling, myalgias and neck stiffness.  Skin:  Negative for pallor and rash.  Hematological:  Negative for adenopathy. Does not bruise/bleed easily.  Psychiatric/Behavioral:  Negative for behavioral problems, confusion and dysphoric mood.     Past Medical History:  Diagnosis Date   Arthritis     Depression    Diabetes mellitus type 2 in obese (HCC)    Hypertension    Migraines    Sleep apnea    Stroke [facility])      Social History   Socioeconomic History   Marital status: Married    Spouse name: [name]   Number of children: Not on file   Years of education: Not on file   Highest education level: Bachelor's degree (e.g., BA, AB, BS)  Occupational History   Occupation: retired    Comment: Charity fundraiser  Tobacco Use   Smoking status: Former    Types: Cigars    Quit date: 01/17/2022    Years since quitting: 0.5   Smokeless tobacco: Never   Tobacco comments:    chew on cigars its not lit  Substance and Sexual Activity   Alcohol use: No    Alcohol/week: 0.0 standard drinks of alcohol   Drug use: No   Sexual activity: Not Currently  Other Topics Concern   Not on file  Social History Narrative   Not on file   Social Determinants of Health   Financial Resource Strain: Low Risk  (01/07/2022)   Overall Financial Resource Strain (CARDIA)    Difficulty of Paying Living Expenses: Not very hard  Food Insecurity: No Food Insecurity (08/05/2022)   Hunger Vital Sign    Worried About Running Out of Food in the Last Year: Never true  Ran Out of Food in the Last Year: Never true  Transportation Needs: No Transportation Needs (08/05/2022)   PRAPARE - Hydrologist (Medical): No    Lack of Transportation (Non-Medical): No  Physical Activity: Insufficiently Active (08/05/2022)   Exercise Vital Sign    Days of Exercise per Week: 7 days    Minutes of Exercise per Session: 20 min  Stress: No Stress Concern Present (08/05/2022)   [place]    Feeling of Stress : Not at all  Social Connections: Moderately Isolated (08/05/2022)   Social Connection and Isolation Panel [NHANES]    Frequency of Communication with Friends and Family: More than three times a week    Frequency of Social Gatherings with Friends  and Family: More than three times a week    Attends Religious Services: Never    Marine scientist or Organizations: No    Attends Archivist Meetings: Never    Marital Status: Married  Human resources officer Violence: Not At Risk (08/05/2022)   Humiliation, Afraid, Rape, and Kick questionnaire    Fear of Current or Ex-Partner: No    Emotionally Abused: No    Physically Abused: No    Sexually Abused: No    Past Surgical History:  Procedure Laterality Date   CHOLECYSTECTOMY     CORONARY ANGIOGRAPHY N/A 01/07/2022   Procedure: CORONARY ANGIOGRAPHY;  Surgeon: Lorretta Harp, MD;  Location: [place] [facility];  Service: Cardiovascular;  Laterality: N/A;   CORONARY ARTERY BYPASS GRAFT N/A 01/08/2022   Procedure: CORONARY ARTERY BYPASS GRAFTING (CABG) TIMES THREE USING LEFT INTERNAL MAMMARY ARTERY AND ENDOSCOPICALLY HARVESTED BILATERAL GREATER SAPHENOUS VEINS;  Surgeon: Lajuana Matte, MD;  Location: [place];  Service: Open Heart Surgery;  Laterality: N/A;   ENDOVEIN HARVEST OF GREATER SAPHENOUS VEIN Right 01/08/2022   Procedure: ENDOVEIN HARVEST OF GREATER SAPHENOUS VEIN;  Surgeon: Lajuana Matte, MD;  Location: [place];  Service: Open Heart Surgery;  Laterality: Right;   KNEE ARTHROSCOPY     LOOP RECORDER INSERTION N/A 06/24/2017   Procedure: LOOP RECORDER INSERTION;  Surgeon: Thompson Grayer, MD;  Location: [place] [facility];  Service: Cardiovascular;  Laterality: N/A;   ROTATOR CUFF REPAIR     TEE WITHOUT CARDIOVERSION N/A 06/24/2017   Procedure: TRANSESOPHAGEAL ECHOCARDIOGRAM (TEE) WITH LOOP;  Surgeon: Acie Fredrickson Wonda Cheng, MD;  Location: [facility];  Service: Cardiovascular;  Laterality: N/A;   TEE WITHOUT CARDIOVERSION N/A 01/08/2022   Procedure: TRANSESOPHAGEAL ECHOCARDIOGRAM (TEE);  Surgeon: Lajuana Matte, MD;  Location: [place];  Service: Open Heart Surgery;  Laterality: N/A;    Family History  Problem Relation Age of Onset   Other Mother        reports in good  health   Other Father        was estranged from family, unknown medical history    Allergies  Allergen Reactions   Flomax [Tamsulosin Hcl] Other (See Comments)    HYPOtension and syncope    Current Outpatient Medications on File Prior to Visit  Medication Sig Dispense Refill   aspirin EC 81 MG EC tablet Take 1 tablet (81 mg total) by mouth daily. Swallow whole. 30 tablet 11   atorvastatin (LIPITOR) 40 MG tablet Take 1 tablet (40 mg total) by mouth daily at 6 PM. 90 tablet 3   carvedilol (COREG) 6.25 MG tablet Take 1 tablet (6.25 mg total) by mouth 2 (two) times daily with  a meal. 180 tablet 3   clopidogrel (PLAVIX) 75 MG tablet TAKE 1 TABLET BY MOUTH EVERY DAY 30 tablet 3   dapagliflozin propanediol (FARXIGA) 10 MG TABS tablet Take 1 tablet (10 mg total) by mouth daily before breakfast. 30 tablet 11   levocetirizine (XYZAL) 5 MG tablet Take 5 mg by mouth daily as needed for allergies.     metFORMIN (GLUCOPHAGE) 500 MG tablet Take 1 tablet (500 mg total) by mouth at bedtime. (Patient taking differently: Take 500 mg by mouth 2 (two) times daily with a meal.)     sacubitril-valsartan (ENTRESTO) 97-103 MG Take 1 tablet by mouth 2 (two) times daily. 180 tablet 3   sertraline (ZOLOFT) 100 MG tablet Take 100 mg by mouth daily.      spironolactone (ALDACTONE) 25 MG tablet Take 0.5 tablets (12.5 mg total) by mouth daily. 45 tablet 3   No current facility-administered medications on file prior to visit.    BP 124/70   Pulse (!) 50   Temp 98.2 F (36.8 C)   Resp 18   Ht 5\' 4"  (1.626 m)   Wt 225 lb (102.1 kg)   SpO2 98%   BMI 38.62 kg/m        Objective:   Physical Exam  General Mental Status- Alert. General Appearance- Not in acute distress.   Skin General: Color- Normal Color. Moisture- Normal Moisture.  Neck Carotid Arteries- Normal color. Moisture- Normal Moisture. No carotid bruits. No JVD.  Chest and Lung Exam Auscultation: Breath  Sounds:-Normal.  Cardiovascular Auscultation:Rythm- Regular. Murmurs & Other Heart Sounds:Auscultation of the heart reveals- No Murmurs.  Abdomen Inspection:-Inspeection Normal. Palpation/Percussion:Note:No mass. Palpation and Percussion of the abdomen reveal- Non Tender, Non Distended + BS, no rebound or guarding.   Neurologic Cranial Nerve exam:- CN III-XII intact(No nystagmus), symmetric smile. Strength:- 5/5 equal and symmetric strength both upper and lower extremities.   Lower ext- no pedal edam. Calfs symmetric. Negative homans signs.     Assessment & Plan:   Patient Instructions  Diabetes- well controlled per last a1c. Continue Metformin 500 mg 1/2 tab daily and farxiga 10 mg daily.   Htn bp well controlled and clinically stable with chf continue coreg 6.25 mg bid, spironolactone 25 mg 1/2 tab, daily,  aspirin 81 mg daily and entresto daily. 3 lb wt gain since last visit. Watch for increasing pedal edema, dyspnea and wt gain. If that combination occurs notify .  For high cholesterol continue atorvastatin 40 mg daily.  Future labs to be week week of Aug 27, 2022. Cmp, lipid, bnp and a1c  Follow up date to be determined after lab review.      Aug 29, 2022, PA-C

## 2022-08-13 NOTE — Addendum Note (Signed)
Addended by: Jeronimo Greaves on: 08/13/2022 10:39 AM   Modules accepted: Orders

## 2022-08-27 ENCOUNTER — Other Ambulatory Visit (INDEPENDENT_AMBULATORY_CARE_PROVIDER_SITE_OTHER): Payer: Medicare Other

## 2022-08-27 DIAGNOSIS — E118 Type 2 diabetes mellitus with unspecified complications: Secondary | ICD-10-CM | POA: Diagnosis not present

## 2022-08-27 DIAGNOSIS — I10 Essential (primary) hypertension: Secondary | ICD-10-CM | POA: Diagnosis not present

## 2022-08-27 DIAGNOSIS — I5022 Chronic systolic (congestive) heart failure: Secondary | ICD-10-CM

## 2022-08-27 DIAGNOSIS — E785 Hyperlipidemia, unspecified: Secondary | ICD-10-CM

## 2022-08-27 LAB — COMPREHENSIVE METABOLIC PANEL
ALT: 9 U/L (ref 0–53)
AST: 13 U/L (ref 0–37)
Albumin: 4 g/dL (ref 3.5–5.2)
Alkaline Phosphatase: 36 U/L — ABNORMAL LOW (ref 39–117)
BUN: 20 mg/dL (ref 6–23)
CO2: 25 mEq/L (ref 19–32)
Calcium: 8.6 mg/dL (ref 8.4–10.5)
Chloride: 105 mEq/L (ref 96–112)
Creatinine, Ser: 1.3 mg/dL (ref 0.40–1.50)
GFR: 54.74 mL/min — ABNORMAL LOW (ref >60.00)
Glucose, Bld: 217 mg/dL — ABNORMAL HIGH (ref 70–99)
Potassium: 3.8 mEq/L (ref 3.5–5.1)
Sodium: 138 mEq/L (ref 135–145)
Total Bilirubin: 0.7 mg/dL (ref 0.2–1.2)
Total Protein: 6.1 g/dL (ref 6.0–8.3)

## 2022-08-27 LAB — LIPID PANEL
Cholesterol: 118 mg/dL (ref 0–200)
HDL: 39 mg/dL — ABNORMAL LOW (ref >39.00)
NonHDL: 79.04
Total CHOL/HDL Ratio: 3
Triglycerides: 221 mg/dL — ABNORMAL HIGH (ref 0.0–149.0)
VLDL: 44.2 mg/dL — ABNORMAL HIGH (ref 0.0–40.0)

## 2022-08-27 LAB — LDL CHOLESTEROL, DIRECT: Direct LDL: 62 mg/dL

## 2022-08-27 LAB — HEMOGLOBIN A1C: Hgb A1c MFr Bld: 6.6 % — ABNORMAL HIGH (ref 4.6–6.5)

## 2022-08-27 LAB — BRAIN NATRIURETIC PEPTIDE: Pro B Natriuretic peptide (BNP): 57 pg/mL (ref 0.0–100.0)

## 2022-08-31 ENCOUNTER — Other Ambulatory Visit: Payer: Self-pay | Admitting: Cardiovascular Disease

## 2022-10-31 ENCOUNTER — Other Ambulatory Visit: Payer: Self-pay | Admitting: Nurse Practitioner

## 2022-10-31 DIAGNOSIS — I10 Essential (primary) hypertension: Secondary | ICD-10-CM

## 2022-11-26 ENCOUNTER — Other Ambulatory Visit: Payer: Self-pay | Admitting: Nurse Practitioner

## 2022-12-05 ENCOUNTER — Encounter: Payer: Self-pay | Admitting: Medical

## 2022-12-05 ENCOUNTER — Ambulatory Visit (INDEPENDENT_AMBULATORY_CARE_PROVIDER_SITE_OTHER): Payer: Medicare HMO | Admitting: Medical

## 2022-12-05 VITALS — BP 112/70 | HR 56 | Temp 98.2°F | Resp 18 | Ht 64.0 in | Wt 231.6 lb

## 2022-12-05 DIAGNOSIS — I2510 Atherosclerotic heart disease of native coronary artery without angina pectoris: Secondary | ICD-10-CM | POA: Diagnosis not present

## 2022-12-05 DIAGNOSIS — E118 Type 2 diabetes mellitus with unspecified complications: Secondary | ICD-10-CM

## 2022-12-05 DIAGNOSIS — Z23 Encounter for immunization: Secondary | ICD-10-CM | POA: Diagnosis not present

## 2022-12-05 DIAGNOSIS — I10 Essential (primary) hypertension: Secondary | ICD-10-CM

## 2022-12-05 DIAGNOSIS — I5022 Chronic systolic (congestive) heart failure: Secondary | ICD-10-CM

## 2022-12-05 LAB — CBC WITH DIFFERENTIAL/PLATELET
Basophils Absolute: 0.1 10*3/uL (ref 0.0–0.1)
Basophils Relative: 0.8 % (ref 0.0–3.0)
Eosinophils Absolute: 0.2 10*3/uL (ref 0.0–0.7)
Eosinophils Relative: 3.1 % (ref 0.0–5.0)
HCT: 46.1 % (ref 39.0–52.0)
Hemoglobin: 15.9 g/dL (ref 13.0–17.0)
Lymphocytes Relative: 22.6 % (ref 12.0–46.0)
Lymphs Abs: 1.7 10*3/uL (ref 0.7–4.0)
MCHC: 34.5 g/dL (ref 30.0–36.0)
MCV: 87.7 fl (ref 78.0–100.0)
Monocytes Absolute: 0.7 10*3/uL (ref 0.1–1.0)
Monocytes Relative: 9.7 % (ref 3.0–12.0)
Neutro Abs: 4.9 10*3/uL (ref 1.4–7.7)
Neutrophils Relative %: 63.8 % (ref 43.0–77.0)
Platelets: 158 10*3/uL (ref 150.0–400.0)
RBC: 5.25 Mil/uL (ref 4.22–5.81)
RDW: 13.3 % (ref 11.5–15.5)
WBC: 7.6 10*3/uL (ref 4.0–10.5)

## 2022-12-05 LAB — LIPID PANEL
Cholesterol: 128 mg/dL (ref 0–200)
HDL: 36.1 mg/dL — ABNORMAL LOW (ref >39.00)
LDL Cholesterol: 56 mg/dL (ref 0–99)
NonHDL: 92.25
Total CHOL/HDL Ratio: 4
Triglycerides: 181 mg/dL — ABNORMAL HIGH (ref 0.0–149.0)
VLDL: 36.2 mg/dL (ref 0.0–40.0)

## 2022-12-05 LAB — COMPREHENSIVE METABOLIC PANEL
ALT: 8 U/L (ref 0–53)
AST: 13 U/L (ref 0–37)
Albumin: 4.1 g/dL (ref 3.5–5.2)
Alkaline Phosphatase: 38 U/L — ABNORMAL LOW (ref 39–117)
BUN: 22 mg/dL (ref 6–23)
CO2: 28 mEq/L (ref 19–32)
Calcium: 9 mg/dL (ref 8.4–10.5)
Chloride: 103 mEq/L (ref 96–112)
Creatinine, Ser: 1.38 mg/dL (ref 0.40–1.50)
GFR: 50.86 mL/min — ABNORMAL LOW (ref >60.00)
Glucose, Bld: 162 mg/dL — ABNORMAL HIGH (ref 70–99)
Potassium: 4.2 mEq/L (ref 3.5–5.1)
Sodium: 139 mEq/L (ref 135–145)
Total Bilirubin: 1 mg/dL (ref 0.2–1.2)
Total Protein: 6.2 g/dL (ref 6.0–8.3)

## 2022-12-05 LAB — MICROALBUMIN / CREATININE URINE RATIO
Creatinine,U: 94.9 mg/dL
Microalb Creat Ratio: 0.7 mg/g (ref 0.0–30.0)
Microalb, Ur: <0.7 mg/dL (ref 0.0–1.9)

## 2022-12-05 LAB — HEMOGLOBIN A1C: Hgb A1c MFr Bld: 7.3 % — ABNORMAL HIGH (ref 4.6–6.5)

## 2022-12-05 NOTE — Progress Notes (Signed)
Subjective:    Patient ID: Raymond [name], male    DOB: Mar 28, 1951, 72 y.o.   MRN: [id number]  HPI Pt in for follow up. Last AVS in October.  "Diabetes- well controlled per last a1c. Continue Metformin 500 mg 1/2 tab daily and farxiga 10 mg daily.    Htn bp well controlled and clinically stable with chf continue coreg 6.25 mg bid, spironolactone 25 mg 1/2 tab, daily,  aspirin 81 mg daily and entresto daily. 3 lb wt gain since last visit. Watch for increasing pedal edema, dyspnea and wt gain. If that combination occurs notify us.   For high cholesterol continue atorvastatin 40 mg daily."   On review  Chf- continue  coreg 6.25 mg bid, spironolactone 25 mg 1/2 tab, daily,  aspirin 81 mg daily and entresto daily follow up with cardiologist as regularly scheduled."     In October last A1c was 6.6. lipid panel done last time and triglycerides were high. Gfr mild decreased   Pt appointment with cardiologist in March, 2024.  No cardiac or neurlogic signs/symptoms.  Review of Systems  Constitutional:  Negative for chills, fatigue and fever.  HENT:  Negative for congestion, ear pain and facial swelling.   Respiratory:  Negative for cough, chest tightness, shortness of breath and wheezing.   Cardiovascular:  Negative for chest pain and palpitations.  Gastrointestinal:  Negative for abdominal pain, blood in stool and nausea.  Genitourinary:  Negative for dysuria and frequency.  Musculoskeletal:  Negative for back pain, joint swelling and neck pain.  Skin:  Negative for rash.  Neurological:  Negative for dizziness, syncope, numbness and headaches.  Hematological:  Negative for adenopathy. Does not bruise/bleed easily.  Psychiatric/Behavioral:  Negative for behavioral problems, confusion, decreased concentration, sleep disturbance and suicidal ideas. The patient is not nervous/anxious.     Past Medical History:  Diagnosis Date   Arthritis    Depression    Diabetes mellitus type 2 in  obese ([place])    Hypertension    Migraines    Sleep apnea    Stroke [facility])      Social History   Socioeconomic History   Marital status: Married    Spouse name: Marlowe Kays   Number of children: Not on file   Years of education: Not on file   Highest education level: Bachelor's degree (e.g., BA, AB, BS)  Occupational History   Occupation: retired    Comment: Therapist, sports  Tobacco Use   Smoking status: Former    Types: Cigars    Quit date: 01/17/2022    Years since quitting: 0.8   Smokeless tobacco: Never   Tobacco comments:    chew on cigars its not lit  Substance and Sexual Activity   Alcohol use: No    Alcohol/week: 0.0 standard drinks of alcohol   Drug use: No   Sexual activity: Not Currently  Other Topics Concern   Not on file  Social History Narrative   Not on file   Social Determinants of Health   Financial Resource Strain: Low Risk  (01/07/2022)   Overall Financial Resource Strain (CARDIA)    Difficulty of Paying Living Expenses: Not very hard  Food Insecurity: No Food Insecurity (08/05/2022)   Hunger Vital Sign    Worried About Running Out of Food in the Last Year: Never true    Ran Out of Food in the Last Year: Never true  Transportation Needs: No Transportation Needs (08/05/2022)   PRAPARE - Transportation    Lack of  Transportation (Medical): No    Lack of Transportation (Non-Medical): No  Physical Activity: Insufficiently Active (08/05/2022)   Exercise Vital Sign    Days of Exercise per Week: 7 days    Minutes of Exercise per Session: 20 min  Stress: No Stress Concern Present (08/05/2022)   [place]    Feeling of Stress : Not at all  Social Connections: Moderately Isolated (08/05/2022)   Social Connection and Isolation Panel [NHANES]    Frequency of Communication with Friends and Family: More than three times a week    Frequency of Social Gatherings with Friends and Family: More than three times a week     Attends Religious Services: Never    Marine scientist or Organizations: No    Attends Archivist Meetings: Never    Marital Status: Married  Human resources officer Violence: Not At Risk (08/05/2022)   Humiliation, Afraid, Rape, and Kick questionnaire    Fear of Current or Ex-Partner: No    Emotionally Abused: No    Physically Abused: No    Sexually Abused: No    Past Surgical History:  Procedure Laterality Date   CHOLECYSTECTOMY     CORONARY ANGIOGRAPHY N/A 01/07/2022   Procedure: CORONARY ANGIOGRAPHY;  Surgeon: Lorretta Harp, MD;  Location: [place] [facility];  Service: Cardiovascular;  Laterality: N/A;   CORONARY ARTERY BYPASS GRAFT N/A 01/08/2022   Procedure: CORONARY ARTERY BYPASS GRAFTING (CABG) TIMES THREE USING LEFT INTERNAL MAMMARY ARTERY AND ENDOSCOPICALLY HARVESTED BILATERAL GREATER SAPHENOUS VEINS;  Surgeon: Lajuana Matte, MD;  Location: [place];  Service: Open Heart Surgery;  Laterality: N/A;   ENDOVEIN HARVEST OF GREATER SAPHENOUS VEIN Right 01/08/2022   Procedure: ENDOVEIN HARVEST OF GREATER SAPHENOUS VEIN;  Surgeon: Lajuana Matte, MD;  Location: [place];  Service: Open Heart Surgery;  Laterality: Right;   KNEE ARTHROSCOPY     LOOP RECORDER INSERTION N/A 06/24/2017   Procedure: LOOP RECORDER INSERTION;  Surgeon: Thompson Grayer, MD;  Location: [place] [facility];  Service: Cardiovascular;  Laterality: N/A;   ROTATOR CUFF REPAIR     TEE WITHOUT CARDIOVERSION N/A 06/24/2017   Procedure: TRANSESOPHAGEAL ECHOCARDIOGRAM (TEE) WITH LOOP;  Surgeon: Acie Fredrickson Wonda Cheng, MD;  Location: [facility] ENDOSCOPY;  Service: Cardiovascular;  Laterality: N/A;   TEE WITHOUT CARDIOVERSION N/A 01/08/2022   Procedure: TRANSESOPHAGEAL ECHOCARDIOGRAM (TEE);  Surgeon: Lajuana Matte, MD;  Location: [place];  Service: Open Heart Surgery;  Laterality: N/A;    Family History  Problem Relation Age of Onset   Other Mother        reports in good health   Other Father        was estranged from  family, unknown medical history    Allergies  Allergen Reactions   Flomax [Tamsulosin Hcl] Other (See Comments)    HYPOtension and syncope    Current Outpatient Medications on File Prior to Visit  Medication Sig Dispense Refill   aspirin EC 81 MG EC tablet Take 1 tablet (81 mg total) by mouth daily. Swallow whole. 30 tablet 11   atorvastatin (LIPITOR) 40 MG tablet Take 1 tablet (40 mg total) by mouth daily at 6 PM. 90 tablet 3   carvedilol (COREG) 6.25 MG tablet Take 1 tablet (6.25 mg total) by mouth 2 (two) times daily with a meal. 180 tablet 3   clopidogrel (PLAVIX) 75 MG tablet TAKE 1 TABLET BY MOUTH EVERY DAY 30 tablet 3   dapagliflozin propanediol (FARXIGA)  10 MG TABS tablet Take 1 tablet (10 mg total) by mouth daily before breakfast. 30 tablet 11   levocetirizine (XYZAL) 5 MG tablet Take 5 mg by mouth daily as needed for allergies.     metFORMIN (GLUCOPHAGE) 500 MG tablet Take 1 tablet (500 mg total) by mouth at bedtime. (Patient taking differently: Take 500 mg by mouth 2 (two) times daily with a meal.)     sacubitril-valsartan (ENTRESTO) 97-103 MG Take 1 tablet by mouth 2 (two) times daily. 180 tablet 3   sertraline (ZOLOFT) 100 MG tablet TAKE 1 TABLET BY MOUTH EVERY DAY 30 tablet 1   spironolactone (ALDACTONE) 25 MG tablet TAKE 1/2 TABLET (12.5 MG) BY MOUTH DAILY 45 tablet 3   No current facility-administered medications on file prior to visit.    BP 112/70   Pulse (!) 56   Temp 98.2 F (36.8 C)   Resp 18   Ht 5\' 4"  (1.626 m)   Wt 231 lb 9.6 oz (105.1 kg)   SpO2 98%   BMI 39.75 kg/m          Objective:   Physical Exam  General Mental Status- Alert. General Appearance- Not in acute distress.   Skin General: Color- Normal Color. Moisture- Normal Moisture.  Neck Carotid Arteries- Normal color. Moisture- Normal Moisture. No carotid bruits. No JVD.  Chest and Lung Exam Auscultation: Breath Sounds:-Normal.  Cardiovascular Auscultation:Rythm- Regular. Murmurs  & Other Heart Sounds:Auscultation of the heart reveals- No Murmurs.  Abdomen Inspection:-Inspeection Normal. Palpation/Percussion:Note:No mass. Palpation and Percussion of the abdomen reveal- Non Tender, Non Distended + BS, no rebound or guarding.   Neurologic Cranial Nerve exam:- CN III-XII intact(No nystagmus), symmetric smile. Strength:- 5/5 equal and symmetric strength both upper and lower extremities.   Lower ext- no pedal edema. Symmetric bilaterally.        Assessment & Plan:   Patient Instructions  1. Essential hypertension Blood pressure well-controlled today on current regimen.  If you get any lightheadedness when standing from seated position please let us know.  Blood pressure is on lower end. - Comp Met (CMET) - Lipid panel  2. DM type 2, controlled, with complication (HCC) Continue Metformin 500 mg 1/2 tab daily and farxiga 10 mg daily.  - Hemoglobin A1c - Microalbumin / creatinine urine ratio  3. Chronic systolic congestive heart failure ([place]) Continue to follow-up with cardiologist as regular scheduled.continue  coreg 6.25 mg bid, spironolactone 25 mg 1/2 tab, daily,  aspirin 81 mg daily and entresto daily  4. Coronary artery disease involving native coronary artery of native heart, unspecified whether angina present Continue atorvastatin and aspirin daily. - Lipid panel    Follow-up in 3 months or sooner if needed.   Mackie Pai, PA-C

## 2022-12-05 NOTE — Patient Instructions (Addendum)
1. Essential hypertension Blood pressure well-controlled today on current regimen.  If you get any lightheadedness when standing from seated position please let us know.  Blood pressure is on lower end. - Comp Met (CMET) - Lipid panel  2. DM type 2, controlled, with complication (HCC) Continue Metformin 500 mg 1/2 tab daily and farxiga 10 mg daily.  - Hemoglobin A1c - Microalbumin / creatinine urine ratio  3. Chronic systolic congestive heart failure ([place]) Continue to follow-up with cardiologist as regular scheduled.continue  coreg 6.25 mg bid, spironolactone 25 mg 1/2 tab, daily,  aspirin 81 mg daily and entresto daily  4. Coronary artery disease involving native coronary artery of native heart, unspecified whether angina present Continue atorvastatin and aspirin daily. - Lipid panel    Follow-up in 3 months or sooner if needed.  Discussed PCV 20 vaccine today.  On review I could not find your records today.  Benefits and risk of PCV vaccine discussed.  Also consider getting RSV vaccine through your pharmacy.

## 2022-12-05 NOTE — Addendum Note (Signed)
Addended by: Jeronimo Greaves on: 12/05/2022 10:57 AM   Modules accepted: Orders

## 2023-01-03 ENCOUNTER — Other Ambulatory Visit: Payer: Self-pay | Admitting: *Deleted

## 2023-01-03 MED ORDER — CLOPIDOGREL BISULFATE 75 MG PO TABS: 75.0000 mg | ORAL_TABLET | Freq: Every day | ORAL | 11 refills | Status: DC

## 2023-01-03 MED ORDER — CLOPIDOGREL BISULFATE 75 MG PO TABS: 75.0000 mg | ORAL_TABLET | Freq: Every day | ORAL | 12 refills | Status: DC

## 2023-01-27 ENCOUNTER — Other Ambulatory Visit (HOSPITAL_COMMUNITY): Payer: Self-pay | Admitting: Adult Health

## 2023-01-27 ENCOUNTER — Other Ambulatory Visit: Payer: Self-pay | Admitting: Cardiovascular Disease

## 2023-02-03 ENCOUNTER — Encounter: Payer: Self-pay | Admitting: Medical

## 2023-02-03 MED ORDER — METFORMIN HCL 500 MG PO TABS: 500.0000 mg | ORAL_TABLET | Freq: Every day | ORAL | 0 refills | Status: DC

## 2023-02-27 NOTE — Progress Notes (Signed)
Cardiology Office Note:    Date:  02/27/2023   ID:  Raymond [name], DOB 1951-09-17, MRN [id number]  PCP:  Marisue Brooklyn   [facility] Providers Cardiologist:  Thurmon Fair, MD     Referring MD: Marisue Brooklyn   No chief complaint on file.   History of Present Illness:    Raymond [name] is a 72 y.o. male with a hx of CAD presenting with acute coronary syndrome (NSTEMI 01/05/2022) and moderate ischemic cardiomyopathy (EF 35-40%) with presence of apical LV thrombus, for which he underwent three-vessel CABG (01/08/2022 LIMA-LAD, SVG-OM, SVG-diagonal).  Additional problems include a remote history of cryptogenic stroke (unrevealing loop recorder for years), type 2 diabetes mellitus, hypertension, hypercholesterolemia, OSA on CPAP.  He is on Entresto maximum dose, carvedilol (limited by heart rate), Farxiga, spironolactone as well as aspirin and atorvastatin.   Echo performed on 03/18/2022 shows EF has improved to 65-70%, although he still has an apical area of hypokinesis.  Left ventricular thrombus is no longer seen.  Eliquis has been stopped.  Renal function is stable with creatinine 1.30-1.38 (GFR 50-55).  Lipid profile in February showed LDL cholesterol of 56, HDL 36. A1c was 7.3%.  The patient specifically denies any chest pain at rest exertion, dyspnea at rest or with exertion, orthopnea, paroxysmal nocturnal dyspnea, syncope, palpitations, focal neurological deficits, intermittent claudication, lower extremity edema, unexplained weight gain, cough, hemoptysis or wheezing. Has bilateral knee pain which sometimes makes it hard to rise from seated position. Has hand tremor which he reports started after he had a stroke, but it is bilateral.  Reports compliance with CPAP and denies daytime hypersomnolence.  Has not had any falls, injuries or bleeding problems.  Past Medical History:  Diagnosis Date   Arthritis    Depression    Diabetes mellitus type 2 in obese  (HCC)    Hypertension    Migraines    Sleep apnea    Stroke [facility])     Past Surgical History:  Procedure Laterality Date   CHOLECYSTECTOMY     CORONARY ANGIOGRAPHY N/A 01/07/2022   Procedure: CORONARY ANGIOGRAPHY;  Surgeon: Runell Gess, MD;  Location: [facility];  Service: Cardiovascular;  Laterality: N/A;   CORONARY ARTERY BYPASS GRAFT N/A 01/08/2022   Procedure: CORONARY ARTERY BYPASS GRAFTING (CABG) TIMES THREE USING LEFT INTERNAL MAMMARY ARTERY AND ENDOSCOPICALLY HARVESTED BILATERAL GREATER SAPHENOUS VEINS;  Surgeon: Corliss Skains, MD;  Location: [facility];  Service: Open Heart Surgery;  Laterality: N/A;   ENDOVEIN HARVEST OF GREATER SAPHENOUS VEIN Right 01/08/2022   Procedure: ENDOVEIN HARVEST OF GREATER SAPHENOUS VEIN;  Surgeon: Corliss Skains, MD;  Location: [facility];  Service: Open Heart Surgery;  Laterality: Right;   KNEE ARTHROSCOPY     LOOP RECORDER INSERTION N/A 06/24/2017   Procedure: LOOP RECORDER INSERTION;  Surgeon: Hillis Range, MD;  [facility]: [facility];  Service: Cardiovascular;  Laterality: N/A;   ROTATOR CUFF REPAIR     TEE WITHOUT CARDIOVERSION N/A 06/24/2017   Procedure: TRANSESOPHAGEAL ECHOCARDIOGRAM (TEE) WITH LOOP;  Surgeon: Elease Hashimoto Deloris Ping, MD;  Location: [facility] ENDOSCOPY;  Service: Cardiovascular;  Laterality: N/A;   TEE WITHOUT CARDIOVERSION N/A 01/08/2022   Procedure: TRANSESOPHAGEAL ECHOCARDIOGRAM (TEE);  Surgeon: Corliss Skains, MD;  Location: [facility] OR;  Service: Open Heart Surgery;  Laterality: N/A;    Current Medications: No outpatient medications have been marked as taking for the 02/28/23 encounter (Appointment) with Thurmon Fair, MD.     Allergies:  Flomax [tamsulosin hcl]   Social History   Socioeconomic History   Marital status: Married    Spouse name: [name]   Number of children: Not on file   Years of education: Not on file   Highest education level: Bachelor's degree (e.g., BA, AB, BS)  Occupational History    Occupation: retired    Comment: Charity fundraiser  Tobacco Use   Smoking status: Former    Types: Cigars    Quit date: 01/17/2022    Years since quitting: 1.1   Smokeless tobacco: Never   Tobacco comments:    chew on cigars its not lit  Substance and Sexual Activity   Alcohol use: No    Alcohol/week: 0.0 standard drinks of alcohol   Drug use: No   Sexual activity: Not Currently  Other Topics Concern   Not on file  Social History Narrative   Not on file   Social Determinants of Health   Financial Resource Strain: Low Risk  (01/07/2022)   Overall Financial Resource Strain (CARDIA)    Difficulty of Paying Living Expenses: Not very hard  Food Insecurity: No Food Insecurity (08/05/2022)   Hunger Vital Sign    Worried About Running Out of Food in the Last Year: Never true    Ran Out of Food in the Last Year: Never true  Transportation Needs: No Transportation Needs (08/05/2022)   PRAPARE - Administrator, Civil Service (Medical): No    Lack of Transportation (Non-Medical): No  Physical Activity: Insufficiently Active (08/05/2022)   Exercise Vital Sign    Days of Exercise per Week: 7 days    Minutes of Exercise per Session: 20 min  Stress: No Stress Concern Present (08/05/2022)   [employer] of Occupational Health - Occupational Stress Questionnaire    Feeling of Stress : Not at all  Social Connections: Moderately Isolated (08/05/2022)   Social Connection and Isolation Panel [NHANES]    Frequency of Communication with Friends and Family: More than three times a week    Frequency of Social Gatherings with Friends and Family: More than three times a week    Attends Religious Services: Never    Database administrator or Organizations: No    Attends Banker Meetings: Never    Marital Status: Married     Family History: The patient's family history includes Other in his father and mother.  ROS:   Please see the history of present illness.     All other systems  reviewed and are negative.  EKGs/Labs/Other Studies Reviewed:    The following studies were reviewed today: ECHO 03/18/2022   1. LV has improved to 65-70%. The apical septum and apex are mildly  hypokinetic. No LV thrombus on this study. Left ventricular ejection  fraction, by estimation, is 65 to 70%. The left ventricle has normal  function. The left ventricle demonstrates  regional wall motion abnormalities (see scoring diagram/findings for  description). Left ventricular diastolic parameters are consistent with  Grade I diastolic dysfunction (impaired relaxation).   2. Right ventricular systolic function is normal. The right ventricular  size is normal. Tricuspid regurgitation signal is inadequate for assessing  PA pressure.   3. The mitral valve is grossly normal. Trivial mitral valve  regurgitation. No evidence of mitral stenosis.   4. The aortic valve is tricuspid. Aortic valve regurgitation is not  visualized. No aortic stenosis is present.   5. The inferior vena cava is normal in size with greater than 50%  respiratory  variability, suggesting right atrial pressure of 3 mmHg.   Comparison(s): Changes from prior study are noted. EF has improved. LV  thrombus is no longer present.   EKG:  EKG is ordered today.  It is very similar to previous tracings and shows sinus bradycardia, a single PAC and lateral T wave inversion.    Recent Labs: 08/27/2022: Pro B Natriuretic peptide (BNP) 57.0 12/05/2022: ALT 8; BUN 22; Creatinine, Ser 1.38; Hemoglobin 15.9; Platelets 158.0; Potassium 4.2; Sodium 139  Recent Lipid Panel    Component Value Date/Time   CHOL 128 12/05/2022 1040   TRIG 181.0 (H) 12/05/2022 1040   HDL 36.10 (L) 12/05/2022 1040   CHOLHDL 4 12/05/2022 1040   VLDL 36.2 12/05/2022 1040   LDLCALC 56 12/05/2022 1040   LDLDIRECT 62.0 08/27/2022 0807     Risk Assessment/Calculations:           Physical Exam:    VS:  There were no vitals taken for this visit.    Wt  Readings from Last 3 Encounters:  12/05/22 231 lb 9.6 oz (105.1 kg)  08/13/22 225 lb (102.1 kg)  08/05/22 222 lb (100.7 kg)     General: Alert, oriented x3, no distress, severely obese Head: no evidence of trauma, PERRL, EOMI, no exophtalmos or lid lag, no myxedema, no xanthelasma; normal ears, nose and oropharynx Neck: normal jugular venous pulsations and no hepatojugular reflux; brisk carotid pulses without delay and no carotid bruits Chest: clear to auscultation, no signs of consolidation by percussion or palpation, normal fremitus, symmetrical and full respiratory excursions Cardiovascular: normal position and quality of the apical impulse, regular rhythm with occasional ectopic beats, normal first and second heart sounds, no murmurs, rubs or gallops Abdomen: no tenderness or distention, no masses by palpation, no abnormal pulsatility or arterial bruits, normal bowel sounds, no hepatosplenomegaly Extremities: no clubbing, cyanosis or edema; 2+ radial, ulnar and brachial pulses bilaterally; 2+ right femoral, posterior tibial and dorsalis pedis pulses; 2+ left femoral, posterior tibial and dorsalis pedis pulses; no subclavian or femoral bruits Neurological: grossly nonfocal Psych: Normal mood and affect   ASSESSMENT:    1. Chronic diastolic heart failure   2. Coronary artery disease involving native coronary artery of native heart without angina pectoris   3. Essential hypertension   4. Mixed hyperlipidemia   5. Type 2 diabetes mellitus without complication, without long-term current use of insulin   6. OSA (obstructive sleep apnea)   7. Severe obesity (BMI 35.0-39.9) with comorbidity   8. Stage 3a chronic kidney disease     PLAN:    In order of problems listed above:  CHF: NYHA functional class I and clinically euvolemic without loop diuretics.  On Entresto, carvedilol, Farxiga, spironolactone.  Will try to see if generic Marcelline Deist is covered by his insurance and less  expensive. CAD: presented with NSTEMI 12 months ago.  Can stop clopidogrel. HTN: Very well-controlled.  He does not have any symptoms of hypotension.  He HLP: LDL at target.  HDL remains low but will not improve without substantial weight loss. DM: Adequate goal.  Keep on Farxiga for dual benefit for heart failure and diabetes. OSA: compliant w CPAP.  Denies daytime hypersomnolence Obesity: Recommended that he remain physically active.  Reviewed dietary recommendations. CKD stage 3a: Stable with GFR around 50-55.        Medication Adjustments/Labs and Tests Ordered: Current medicines are reviewed at length with the patient today.  Concerns regarding medicines are outlined above.  No orders of the  defined types were placed in this encounter.  No orders of the defined types were placed in this encounter.   There are no Patient Instructions on file for this visit.   Signed, Thurmon Fair, MD  02/27/2023 5:10 PM    [facility]

## 2023-02-28 ENCOUNTER — Ambulatory Visit: Payer: Medicare HMO | Attending: Cardiovascular Disease | Admitting: Cardiovascular Disease

## 2023-02-28 ENCOUNTER — Encounter: Payer: Self-pay | Admitting: Cardiovascular Disease

## 2023-02-28 ENCOUNTER — Other Ambulatory Visit: Payer: Self-pay | Admitting: Cardiovascular Disease

## 2023-02-28 ENCOUNTER — Other Ambulatory Visit (HOSPITAL_BASED_OUTPATIENT_CLINIC_OR_DEPARTMENT_OTHER): Payer: Self-pay

## 2023-02-28 VITALS — BP 106/68 | HR 56 | Ht 64.0 in | Wt 229.4 lb

## 2023-02-28 DIAGNOSIS — N1831 Chronic kidney disease, stage 3a: Secondary | ICD-10-CM

## 2023-02-28 DIAGNOSIS — E782 Mixed hyperlipidemia: Secondary | ICD-10-CM | POA: Diagnosis not present

## 2023-02-28 DIAGNOSIS — I10 Essential (primary) hypertension: Secondary | ICD-10-CM | POA: Diagnosis not present

## 2023-02-28 DIAGNOSIS — I5032 Chronic diastolic (congestive) heart failure: Secondary | ICD-10-CM

## 2023-02-28 DIAGNOSIS — E119 Type 2 diabetes mellitus without complications: Secondary | ICD-10-CM

## 2023-02-28 DIAGNOSIS — G4733 Obstructive sleep apnea (adult) (pediatric): Secondary | ICD-10-CM

## 2023-02-28 DIAGNOSIS — I2510 Atherosclerotic heart disease of native coronary artery without angina pectoris: Secondary | ICD-10-CM

## 2023-02-28 MED ORDER — DAPAGLIFLOZIN PROPANEDIOL 10 MG PO TABS
10.0000 mg | ORAL_TABLET | Freq: Every day | ORAL | 10 refills | Status: AC
  Filled 2023-02-28: qty 30, 30d supply, fill #0
  Filled 2023-03-31 – 2023-04-01 (×2): qty 30, 30d supply, fill #1
  Filled 2023-05-01: qty 30, 30d supply, fill #2
  Filled 2023-05-29: qty 30, 30d supply, fill #3
  Filled 2023-07-01: qty 30, 30d supply, fill #4
  Filled 2023-08-04: qty 30, 30d supply, fill #5
  Filled 2023-09-05: qty 30, 30d supply, fill #6

## 2023-02-28 NOTE — Patient Instructions (Signed)
Medication Instructions:  No Changes *If you need a refill on your cardiac medications before your next appointment, please call your pharmacy*  Lab Work: None Ordered  Testing/Procedures: None Ordered  Follow-Up: At [facility], you and your health needs are our priority.  As part of our continuing mission to provide you with exceptional heart care, we have created designated Provider Care Teams.  These Care Teams include your primary Cardiologist (physician) and Advanced Practice Providers (APPs -  Physician Assistants and Nurse Practitioners) who all work together to provide you with the care you need, when you need it.  We recommend signing up for the patient portal called "MyChart".  Sign up information is provided on this After Visit Summary.  MyChart is used to connect with patients for Virtual Visits (Telemedicine).  Patients are able to view lab/test results, encounter notes, upcoming appointments, etc.  Non-urgent messages can be sent to your provider as well.   To learn more about what you can do with MyChart, go to [email or web address].    Your next appointment:   1 year(s)  Provider:   Thurmon Fair, MD

## 2023-03-03 ENCOUNTER — Other Ambulatory Visit (HOSPITAL_BASED_OUTPATIENT_CLINIC_OR_DEPARTMENT_OTHER): Payer: Self-pay

## 2023-03-05 ENCOUNTER — Telehealth: Payer: Self-pay

## 2023-03-05 ENCOUNTER — Ambulatory Visit (INDEPENDENT_AMBULATORY_CARE_PROVIDER_SITE_OTHER): Payer: Medicare HMO | Admitting: Medical

## 2023-03-05 ENCOUNTER — Other Ambulatory Visit (HOSPITAL_BASED_OUTPATIENT_CLINIC_OR_DEPARTMENT_OTHER): Payer: Self-pay

## 2023-03-05 VITALS — BP 119/57 | HR 60 | Temp 98.0°F | Resp 18 | Ht 64.0 in | Wt 228.0 lb

## 2023-03-05 DIAGNOSIS — I10 Essential (primary) hypertension: Secondary | ICD-10-CM

## 2023-03-05 DIAGNOSIS — E118 Type 2 diabetes mellitus with unspecified complications: Secondary | ICD-10-CM

## 2023-03-05 DIAGNOSIS — I2510 Atherosclerotic heart disease of native coronary artery without angina pectoris: Secondary | ICD-10-CM | POA: Diagnosis not present

## 2023-03-05 DIAGNOSIS — I5022 Chronic systolic (congestive) heart failure: Secondary | ICD-10-CM | POA: Diagnosis not present

## 2023-03-05 NOTE — Progress Notes (Signed)
Subjective:    Patient ID: Raymond [name], male    DOB: [birth year], 72 y.o.   MRN: [id number]  HPI  Pt in for follow up.  Last AVS below in "  "1. Essential hypertension Blood pressure well-controlled today on current regimen.  If you get any lightheadedness when standing from seated position please let us know.  Blood pressure is on lower end. - Comp Met (CMET) - Lipid panel   2. DM type 2, controlled, with complication (HCC) Continue Metformin 500 mg 1/2 tab daily and farxiga 10 mg daily.  - Hemoglobin A1c - Microalbumin / creatinine urine ratio   3. Chronic systolic congestive heart failure (HCC) Continue to follow-up with cardiologist as regular scheduled.continue  coreg 6.25 mg bid, spironolactone 25 mg 1/2 tab, daily,  aspirin 81 mg daily and entresto daily   4. Coronary artery disease involving native coronary artery of native heart, unspecified whether angina present Continue atorvastatin and aspirin daily. - Lipid panel"  Pt update me on diabetes that his farxiga cost him $95 per month.   Htn- bp is well controlled. Controlled on coreg.  CHF- continues on  continue  coreg 6.25 mg bid, spironolactone 25 mg 1/2 tab, daily,  aspirin 81 mg daily and entresto daily.  DM- pt on farxiga. Last A1c was 7.3.   CAD and on atorvastatin he continues.   On review pt has seen cardiologist last week.  CHF: NYHA functional class I and clinically euvolemic without loop diuretics.  On Entresto, carvedilol, Farxiga, spironolactone.  Will try to see if generic Marcelline Deist is covered by his insurance and less expensive. CAD: presented with NSTEMI 12 months ago.  Can stop clopidogrel. HTN: Very well-controlled.  He does not have any symptoms of hypotension.  He HLP: LDL at target.  HDL remains low but will not improve without substantial weight loss. DM: Adequate goal.  Keep on Farxiga for dual benefit for heart failure and diabetes. OSA: compliant w CPAP.  Denies daytime  hypersomnolence Obesity: Recommended that he remain physically active.  Reviewed dietary recommendations. CKD stage 3a: Stable with GFR around 50-55.  Review of Systems  Constitutional:  Negative for chills, fatigue and fever.  HENT:  Negative for congestion, ear pain and facial swelling.   Respiratory:  Negative for cough, chest tightness, shortness of breath and wheezing.   Cardiovascular:  Negative for chest pain and palpitations.  Gastrointestinal:  Negative for abdominal pain, blood in stool and nausea.  Genitourinary:  Negative for dysuria and frequency.  Musculoskeletal:  Negative for back pain, joint swelling and neck pain.  Skin:  Negative for rash.  Neurological:  Negative for dizziness, syncope, numbness and headaches.  Hematological:  Negative for adenopathy. Does not bruise/bleed easily.  Psychiatric/Behavioral:  Negative for behavioral problems, confusion, decreased concentration, sleep disturbance and suicidal ideas. The patient is not nervous/anxious.     Past Medical History:  Diagnosis Date   Arthritis    Depression    Diabetes mellitus type 2 in obese    Hypertension    Migraines    Sleep apnea    Stroke [facility])      Social History   Socioeconomic History   Marital status: Married    Spouse name: [name]   Number of children: Not on file   Years of education: Not on file   Highest education level: Bachelor's degree (e.g., BA, AB, BS)  Occupational History   Occupation: retired    Comment: Charity fundraiser  Tobacco Use   Smoking  status: Former    Types: Cigars    Quit date: 01/17/2022    Years since quitting: 1.1   Smokeless tobacco: Never   Tobacco comments:    chew on cigars its not lit  Substance and Sexual Activity   Alcohol use: No    Alcohol/week: 0.0 standard drinks of alcohol   Drug use: No   Sexual activity: Not Currently  Other Topics Concern   Not on file  Social History Narrative   Not on file   Social Determinants of Health   Financial Resource  Strain: Low Risk  (01/07/2022)   Overall Financial Resource Strain (CARDIA)    Difficulty of Paying Living Expenses: Not very hard  Food Insecurity: No Food Insecurity (08/05/2022)   Hunger Vital Sign    Worried About Running Out of Food in the Last Year: Never true    Ran Out of Food in the Last Year: Never true  Transportation Needs: No Transportation Needs (08/05/2022)   PRAPARE - Administrator, Civil Service (Medical): No    Lack of Transportation (Non-Medical): No  Physical Activity: Insufficiently Active (08/05/2022)   Exercise Vital Sign    Days of Exercise per Week: 7 days    Minutes of Exercise per Session: 20 min  Stress: No Stress Concern Present (08/05/2022)   [employer] of Occupational Health - Occupational Stress Questionnaire    Feeling of Stress : Not at all  Social Connections: Moderately Isolated (08/05/2022)   Social Connection and Isolation Panel [NHANES]    Frequency of Communication with Friends and Family: More than three times a week    Frequency of Social Gatherings with Friends and Family: More than three times a week    Attends Religious Services: Never    Database administrator or Organizations: No    Attends Banker Meetings: Never    Marital Status: Married  Catering manager Violence: Not At Risk (08/05/2022)   Humiliation, Afraid, Rape, and Kick questionnaire    Fear of Current or Ex-Partner: No    Emotionally Abused: No    Physically Abused: No    Sexually Abused: No    Past Surgical History:  Procedure Laterality Date   CHOLECYSTECTOMY     CORONARY ANGIOGRAPHY N/A 01/07/2022   Procedure: CORONARY ANGIOGRAPHY;  Surgeon: Runell Gess, MD;  [facility]: [facility];  Service: Cardiovascular;  Laterality: N/A;   CORONARY ARTERY BYPASS GRAFT N/A 01/08/2022   Procedure: CORONARY ARTERY BYPASS GRAFTING (CABG) TIMES THREE USING LEFT INTERNAL MAMMARY ARTERY AND ENDOSCOPICALLY HARVESTED BILATERAL GREATER SAPHENOUS VEINS;   Surgeon: Corliss Skains, MD;  Location: [facility] OR;  Service: Open Heart Surgery;  Laterality: N/A;   ENDOVEIN HARVEST OF GREATER SAPHENOUS VEIN Right 01/08/2022   Procedure: ENDOVEIN HARVEST OF GREATER SAPHENOUS VEIN;  Surgeon: Corliss Skains, MD;  Location: [facility] OR;  Service: Open Heart Surgery;  Laterality: Right;   KNEE ARTHROSCOPY     LOOP RECORDER INSERTION N/A 06/24/2017   Procedure: LOOP RECORDER INSERTION;  Surgeon: Hillis Range, MD;  Location: [facility];  Service: Cardiovascular;  Laterality: N/A;   ROTATOR CUFF REPAIR     TEE WITHOUT CARDIOVERSION N/A 06/24/2017   Procedure: TRANSESOPHAGEAL ECHOCARDIOGRAM (TEE) WITH LOOP;  Surgeon: Elease Hashimoto Deloris Ping, MD;  Location: [facility] ENDOSCOPY;  Service: Cardiovascular;  Laterality: N/A;   TEE WITHOUT CARDIOVERSION N/A 01/08/2022   Procedure: TRANSESOPHAGEAL ECHOCARDIOGRAM (TEE);  Surgeon: Corliss Skains, MD;  Location: [facility] OR;  Service: Open Heart  Surgery;  Laterality: N/A;    Family History  Problem Relation Age of Onset   Other Mother        reports in good health   Other Father        was estranged from family, unknown medical history    Allergies  Allergen Reactions   Flomax [Tamsulosin Hcl] Other (See Comments)    HYPOtension and syncope    Current Outpatient Medications on File Prior to Visit  Medication Sig Dispense Refill   aspirin EC 81 MG EC tablet Take 1 tablet (81 mg total) by mouth daily. Swallow whole. 30 tablet 11   atorvastatin (LIPITOR) 40 MG tablet Take 1 tablet (40 mg total) by mouth daily at 6 PM. 90 tablet 3   carvedilol (COREG) 6.25 MG tablet TAKE 1 TABLET BY MOUTH 2 TIMES A DAY WITH A MEAL 90 tablet 3   dapagliflozin propanediol (FARXIGA) 10 MG TABS tablet Take 1 tablet (10 mg total) by mouth daily before breakfast. 30 tablet 10   ENTRESTO 97-103 MG TAKE 1 TABLET BY MOUTH 2 TIMES A DAY 180 tablet 3   levocetirizine (XYZAL) 5 MG tablet Take 5 mg by mouth daily as needed for allergies.     metFORMIN  (GLUCOPHAGE) 500 MG tablet Take 1 tablet (500 mg total) by mouth daily with breakfast. 90 tablet 0   sertraline (ZOLOFT) 100 MG tablet TAKE 1 TABLET BY MOUTH EVERY DAY 90 tablet 3   spironolactone (ALDACTONE) 25 MG tablet TAKE 1/2 TABLET (12.5 MG) BY MOUTH DAILY 45 tablet 3   No current facility-administered medications on file prior to visit.    BP (!) 119/57   Pulse 60   Temp 98 F (36.7 C)   Resp 18   Ht 5\' 4"  (1.626 m)   Wt 228 lb (103.4 kg)   SpO2 98%   BMI 39.14 kg/m        Objective:   Physical Exam   General Mental Status- Alert. General Appearance- Not in acute distress.   Skin General: Color- Normal Color. Moisture- Normal Moisture.  Neck Carotid Arteries- Normal color. Moisture- Normal Moisture. No carotid bruits. No JVD.  Chest and Lung Exam Auscultation: Breath Sounds:-Normal.  Cardiovascular Auscultation:Rythm- Regular. Murmurs & Other Heart Sounds:Auscultation of the heart reveals- No Murmurs.  Abdomen Inspection:-Inspeection Normal. Palpation/Percussion:Note:No mass. Palpation and Percussion of the abdomen reveal- Non Tender, Non Distended + BS, no rebound or guarding.  Neurologic Cranial Nerve exam:- CN III-XII intact(No nystagmus), symmetric smile. Strength:- 5/5 equal and symmetric strength both upper and lower extremities.      Assessment & Plan:   Patient Instructions  1. DM type 2, controlled, with complication (HCC) Continue farxiga and low sugar diet. Just short of 90 days so early to do A1c. Recommend get scheduled for lab work to be done early next week. - Hemoglobin A1c; Future  2. Essential hypertension Bp well controlled with coreg. - Comp Met (CMET); Future  3. Coronary artery disease involving native coronary artery of native heart, unspecified whether angina present  Continue atorvastatin and aspirin daily. - Lipid panel"   4. Chronic systolic congestive heart failure (HCC)  coreg 6.25 mg bid, spironolactone 25 mg 1/2  tab, daily,  aspirin 81 mg daily and entresto daily.  Follow up 3 months or sooner if needed.     Esperanza Richters, PA-C

## 2023-03-05 NOTE — Telephone Encounter (Signed)
Pt called and lvm to return call in regards to medication cost. Pt can do a patient assistance application on [employer] website to get his farxiga at cheaper cost

## 2023-03-05 NOTE — Telephone Encounter (Signed)
Pt's wife called back , made her aware of the website

## 2023-03-05 NOTE — Patient Instructions (Addendum)
1. DM type 2, controlled, with complication (HCC) Continue farxiga and low sugar diet. Just short of 90 days so early to do A1c. Recommend get scheduled for lab work to be done early next week. - Hemoglobin A1c; Future  2. Essential hypertension Bp well controlled with coreg. - Comp Met (CMET); Future  3. Coronary artery disease involving native coronary artery of native heart, unspecified whether angina present  Continue atorvastatin and aspirin daily. - Lipid panel"   4. Chronic systolic congestive heart failure (HCC)  coreg 6.25 mg bid, spironolactone 25 mg 1/2 tab, daily,  aspirin 81 mg daily and entresto daily.  Follow up 3 months or sooner if needed.

## 2023-03-11 ENCOUNTER — Other Ambulatory Visit (INDEPENDENT_AMBULATORY_CARE_PROVIDER_SITE_OTHER): Payer: Medicare HMO

## 2023-03-11 DIAGNOSIS — E118 Type 2 diabetes mellitus with unspecified complications: Secondary | ICD-10-CM | POA: Diagnosis not present

## 2023-03-11 DIAGNOSIS — I10 Essential (primary) hypertension: Secondary | ICD-10-CM

## 2023-03-11 LAB — COMPREHENSIVE METABOLIC PANEL
ALT: 8 U/L (ref 0–53)
AST: 13 U/L (ref 0–37)
Albumin: 4 g/dL (ref 3.5–5.2)
Alkaline Phosphatase: 34 U/L — ABNORMAL LOW (ref 39–117)
BUN: 18 mg/dL (ref 6–23)
CO2: 25 mEq/L (ref 19–32)
Calcium: 8.7 mg/dL (ref 8.4–10.5)
Chloride: 103 mEq/L (ref 96–112)
Creatinine, Ser: 1.34 mg/dL (ref 0.40–1.50)
GFR: 52.59 mL/min — ABNORMAL LOW (ref >60.00)
Glucose, Bld: 170 mg/dL — ABNORMAL HIGH (ref 70–99)
Potassium: 3.7 mEq/L (ref 3.5–5.1)
Sodium: 139 mEq/L (ref 135–145)
Total Bilirubin: 1 mg/dL (ref 0.2–1.2)
Total Protein: 6.1 g/dL (ref 6.0–8.3)

## 2023-03-11 LAB — HEMOGLOBIN A1C: Hgb A1c MFr Bld: 6.7 % — ABNORMAL HIGH (ref 4.6–6.5)

## 2023-03-31 ENCOUNTER — Other Ambulatory Visit: Payer: Self-pay

## 2023-04-01 ENCOUNTER — Other Ambulatory Visit (HOSPITAL_BASED_OUTPATIENT_CLINIC_OR_DEPARTMENT_OTHER): Payer: Self-pay

## 2023-05-01 ENCOUNTER — Other Ambulatory Visit (HOSPITAL_BASED_OUTPATIENT_CLINIC_OR_DEPARTMENT_OTHER): Payer: Self-pay

## 2023-05-29 ENCOUNTER — Other Ambulatory Visit (HOSPITAL_BASED_OUTPATIENT_CLINIC_OR_DEPARTMENT_OTHER): Payer: Self-pay

## 2023-06-17 ENCOUNTER — Ambulatory Visit (INDEPENDENT_AMBULATORY_CARE_PROVIDER_SITE_OTHER): Payer: Medicare HMO | Admitting: Medical

## 2023-06-17 VITALS — BP 106/50 | HR 96 | Temp 97.9°F | Resp 16 | Ht 64.0 in | Wt 230.6 lb

## 2023-06-17 DIAGNOSIS — H9193 Unspecified hearing loss, bilateral: Secondary | ICD-10-CM

## 2023-06-17 DIAGNOSIS — H6123 Impacted cerumen, bilateral: Secondary | ICD-10-CM

## 2023-06-17 NOTE — Progress Notes (Signed)
   Subjective:    Patient ID: Raymond [name], male    DOB: [birth year], 72 y.o.   MRN: [id number]  HPI Recent went to get new hearing aids as current aids not working. But on evaluation had wax and told needs to get wax cleared before can get hearing check and new aids.  Pt has no uri infection signs/symptoms. No ear pain. No nasal congestion.     Review of Systems See hpi    Objective:   Physical Exam   General- No acute distress. Pleasant patient. Neck- Full range of motion, no jvd Lungs- Clear, even and unlabored. Heart- regular rate and rhythm. Neurologic- CNII- XII grossly intact.  Heent- no sinus pressure.ears- canals both blocked rt and left side. Post lavage canals clear and intact tms. Mild red right tm in small center portion. Left canal tm normal.     Assessment & Plan:   Patient Instructions  Bilateral cerumen impaction with hx of hearing loss and use of hearing aids.  Procedure to remove wax explained. Benefits vs risk discussed. Verbal authorization given.  Wax removed with lavage by MA. Post lavage evaluation showed no wax and tm intact bilaterally.  Explained to pt mil redness rt side likley from wax peeling of tm. But if pt get pain in rt ear let me know.   Pt will follow up with audiologist for re-evaluation.    Esperanza Richters, PA-C

## 2023-06-17 NOTE — Patient Instructions (Signed)
Bilateral cerumen impaction with hx of hearing loss and use of hearing aids.  Procedure to remove wax explained. Benefits vs risk discussed. Verbal authorization given.  Wax removed with lavage by MA. Post lavage evaluation showed no wax and tm intact bilaterally.  Explained to pt mil redness rt side likley from wax peeling of tm. But if pt get pain in rt ear let me know.   Pt will follow up with audiologist for re-evaluation.

## 2023-06-19 ENCOUNTER — Encounter (INDEPENDENT_AMBULATORY_CARE_PROVIDER_SITE_OTHER): Payer: Self-pay

## 2023-07-01 ENCOUNTER — Telehealth: Payer: Medicare HMO | Admitting: Adult Health

## 2023-07-01 ENCOUNTER — Other Ambulatory Visit (HOSPITAL_BASED_OUTPATIENT_CLINIC_OR_DEPARTMENT_OTHER): Payer: Self-pay

## 2023-07-02 ENCOUNTER — Encounter: Payer: Self-pay | Admitting: *Deleted

## 2023-07-03 ENCOUNTER — Other Ambulatory Visit: Payer: Self-pay | Admitting: Cardiovascular Disease

## 2023-07-03 ENCOUNTER — Telehealth (INDEPENDENT_AMBULATORY_CARE_PROVIDER_SITE_OTHER): Payer: Medicare HMO | Admitting: Adult Health

## 2023-07-03 DIAGNOSIS — G4733 Obstructive sleep apnea (adult) (pediatric): Secondary | ICD-10-CM | POA: Diagnosis not present

## 2023-07-11 ENCOUNTER — Ambulatory Visit (INDEPENDENT_AMBULATORY_CARE_PROVIDER_SITE_OTHER): Payer: Medicare HMO | Admitting: Medical

## 2023-07-11 ENCOUNTER — Encounter: Payer: Self-pay | Admitting: Medical

## 2023-07-11 VITALS — BP 112/68 | HR 57 | Temp 98.0°F | Resp 18 | Ht 64.0 in | Wt 235.0 lb

## 2023-07-11 DIAGNOSIS — Z7984 Long term (current) use of oral hypoglycemic drugs: Secondary | ICD-10-CM | POA: Diagnosis not present

## 2023-07-11 DIAGNOSIS — I10 Essential (primary) hypertension: Secondary | ICD-10-CM | POA: Diagnosis not present

## 2023-07-11 DIAGNOSIS — E118 Type 2 diabetes mellitus with unspecified complications: Secondary | ICD-10-CM

## 2023-07-11 DIAGNOSIS — R7989 Other specified abnormal findings of blood chemistry: Secondary | ICD-10-CM

## 2023-07-11 DIAGNOSIS — Z23 Encounter for immunization: Secondary | ICD-10-CM | POA: Diagnosis not present

## 2023-07-11 DIAGNOSIS — I2510 Atherosclerotic heart disease of native coronary artery without angina pectoris: Secondary | ICD-10-CM

## 2023-07-11 DIAGNOSIS — I5022 Chronic systolic (congestive) heart failure: Secondary | ICD-10-CM

## 2023-07-11 LAB — CBC WITH DIFFERENTIAL/PLATELET
Basophils Absolute: 0 10*3/uL (ref 0.0–0.1)
Basophils Relative: 0.6 % (ref 0.0–3.0)
Eosinophils Absolute: 0.2 10*3/uL (ref 0.0–0.7)
Eosinophils Relative: 2.6 % (ref 0.0–5.0)
HCT: 45.1 % (ref 39.0–52.0)
Hemoglobin: 15.3 g/dL (ref 13.0–17.0)
Lymphocytes Relative: 17.2 % (ref 12.0–46.0)
Lymphs Abs: 1 10*3/uL (ref 0.7–4.0)
MCHC: 33.8 g/dL (ref 30.0–36.0)
MCV: 89 fl (ref 78.0–100.0)
Monocytes Absolute: 0.5 10*3/uL (ref 0.1–1.0)
Monocytes Relative: 8.3 % (ref 3.0–12.0)
Neutro Abs: 4.3 10*3/uL (ref 1.4–7.7)
Neutrophils Relative %: 71.3 % (ref 43.0–77.0)
Platelets: 146 10*3/uL — ABNORMAL LOW (ref 150.0–400.0)
RBC: 5.07 Mil/uL (ref 4.22–5.81)
RDW: 13.8 % (ref 11.5–15.5)
WBC: 6 10*3/uL (ref 4.0–10.5)

## 2023-07-11 LAB — LIPID PANEL
Cholesterol: 106 mg/dL (ref 0–200)
HDL: 35.8 mg/dL — ABNORMAL LOW (ref >39.00)
LDL Cholesterol: 28 mg/dL (ref 0–99)
NonHDL: 69.77
Total CHOL/HDL Ratio: 3
Triglycerides: 207 mg/dL — ABNORMAL HIGH (ref 0.0–149.0)
VLDL: 41.4 mg/dL — ABNORMAL HIGH (ref 0.0–40.0)

## 2023-07-11 LAB — COMPREHENSIVE METABOLIC PANEL
ALT: 8 U/L (ref 0–53)
AST: 13 U/L (ref 0–37)
Albumin: 3.8 g/dL (ref 3.5–5.2)
Alkaline Phosphatase: 34 U/L — ABNORMAL LOW (ref 39–117)
BUN: 21 mg/dL (ref 6–23)
CO2: 26 meq/L (ref 19–32)
Calcium: 8.4 mg/dL (ref 8.4–10.5)
Chloride: 104 meq/L (ref 96–112)
Creatinine, Ser: 1.25 mg/dL (ref 0.40–1.50)
GFR: 57.03 mL/min — ABNORMAL LOW (ref >60.00)
Glucose, Bld: 292 mg/dL — ABNORMAL HIGH (ref 70–99)
Potassium: 4 meq/L (ref 3.5–5.1)
Sodium: 138 meq/L (ref 135–145)
Total Bilirubin: 0.8 mg/dL (ref 0.2–1.2)
Total Protein: 6.1 g/dL (ref 6.0–8.3)

## 2023-07-11 LAB — BRAIN NATRIURETIC PEPTIDE: Pro B Natriuretic peptide (BNP): 62 pg/mL (ref 0.0–100.0)

## 2023-07-11 LAB — HEMOGLOBIN A1C: Hgb A1c MFr Bld: 7.2 % — ABNORMAL HIGH (ref 4.6–6.5)

## 2023-07-11 NOTE — Patient Instructions (Addendum)
Htn- pt still on coreg. Bp is on low side initially but better on rehcek. No report of dizziness on standing. Continue coreg. -cmp  DM type 2, controlled, with complication (HCC) Continue metformin and  farxiga. As well as low sugar diet. Get A1c and cmp today  Coronary artery disease involving native coronary artery of native heart, unspecified whether angina present  -lipid panel today. On Atorvastatin daily.   Chronic systolic congestive heart failure (HCC)  -coreg 6.25 mg bid, spironolactone 25 mg 1/2 tab, daily,  aspirin 81 mg daily and entresto daily. - bnp lab  After review of lab may adjust treatment regimen.  Follow up date to be determined after lab review.

## 2023-07-11 NOTE — Progress Notes (Signed)
Subjective:    Patient ID: Raymond [name], male    DOB: 09/23/1951, 72 y.o.   MRN: [id number]  HPI  Pt in for follow up on chronic med dx.  Htn, diabets, chf and CAD.  Htn- pt still on coreg. Bp is on low side. No report of dizziness on standing.  DM type 2, controlled, with complication (HCC) Farxiga and metformin as well as low sugar diet  Coronary artery disease involving native coronary artery of native heart, unspecified whether angina present   On Atorvastatin daily.   Chronic systolic congestive heart failure (HCC)  coreg 6.25 mg bid, spironolactone 25 mg 1/2 tab, daily,  aspirin 81 mg daily and entresto daily. Pt not short of breath. No pedal edema. No orthopnea.    Last visit had wax impaction. We cleared was. Pt went to audiologist and got new hearing aids.    Review of Systems  Constitutional:  Negative for chills, fatigue and fever.  Respiratory:  Negative for cough, chest tightness, shortness of breath and wheezing.   Cardiovascular:  Negative for chest pain and palpitations.  Gastrointestinal:  Negative for abdominal pain.  Neurological:  Negative for dizziness and numbness.    Past Medical History:  Diagnosis Date   Arthritis    Depression    Diabetes mellitus type 2 in obese    Hypertension    Migraines    Sleep apnea    Stroke [facility])      Social History   Socioeconomic History   Marital status: Married    Spouse name: [name]   Number of children: Not on file   Years of education: Not on file   Highest education level: Bachelor's degree (e.g., BA, AB, BS)  Occupational History   Occupation: retired    Comment: Charity fundraiser  Tobacco Use   Smoking status: Former    Types: Cigars    Quit date: 01/17/2022    Years since quitting: 1.4   Smokeless tobacco: Never   Tobacco comments:    chew on cigars its not lit  Substance and Sexual Activity   Alcohol use: No    Alcohol/week: 0.0 standard drinks of alcohol   Drug use: No   Sexual activity: Not  Currently  Other Topics Concern   Not on file  Social History Narrative   Not on file   Social Determinants of Health   Financial Resource Strain: Low Risk  (01/07/2022)   Overall Financial Resource Strain (CARDIA)    Difficulty of Paying Living Expenses: Not very hard  Food Insecurity: No Food Insecurity (08/05/2022)   Hunger Vital Sign    Worried About Running Out of Food in the Last Year: Never true    Ran Out of Food in the Last Year: Never true  Transportation Needs: No Transportation Needs (08/05/2022)   PRAPARE - Administrator, Civil Service (Medical): No    Lack of Transportation (Non-Medical): No  Physical Activity: Insufficiently Active (08/05/2022)   Exercise Vital Sign    Days of Exercise per Week: 7 days    Minutes of Exercise per Session: 20 min  Stress: No Stress Concern Present (08/05/2022)   [employer] of Occupational Health - Occupational Stress Questionnaire    Feeling of Stress : Not at all  Social Connections: Moderately Isolated (08/05/2022)   Social Connection and Isolation Panel [NHANES]    Frequency of Communication with Friends and Family: More than three times a week    Frequency of Social Gatherings with Friends  and Family: More than three times a week    Attends Religious Services: Never    Active Member of Clubs or Organizations: No    Attends Banker Meetings: Never    Marital Status: Married  Catering manager Violence: Not At Risk (08/05/2022)   Humiliation, Afraid, Rape, and Kick questionnaire    Fear of Current or Ex-Partner: No    Emotionally Abused: No    Physically Abused: No    Sexually Abused: No    Past Surgical History:  Procedure Laterality Date   CHOLECYSTECTOMY     CORONARY ANGIOGRAPHY N/A 01/07/2022   Procedure: CORONARY ANGIOGRAPHY;  Surgeon: Runell Gess, MD;  Location: [facility];  Service: Cardiovascular;  Laterality: N/A;   CORONARY ARTERY BYPASS GRAFT N/A 01/08/2022   Procedure:  CORONARY ARTERY BYPASS GRAFTING (CABG) TIMES THREE USING LEFT INTERNAL MAMMARY ARTERY AND ENDOSCOPICALLY HARVESTED BILATERAL GREATER SAPHENOUS VEINS;  Surgeon: Corliss Skains, MD;  Location: [facility] OR;  Service: Open Heart Surgery;  Laterality: N/A;   ENDOVEIN HARVEST OF GREATER SAPHENOUS VEIN Right 01/08/2022   Procedure: ENDOVEIN HARVEST OF GREATER SAPHENOUS VEIN;  Surgeon: Corliss Skains, MD;  Location: [facility] OR;  Service: Open Heart Surgery;  Laterality: Right;   KNEE ARTHROSCOPY     LOOP RECORDER INSERTION N/A 06/24/2017   Procedure: LOOP RECORDER INSERTION;  Surgeon: Hillis Range, MD;  Location: [facility];  Service: Cardiovascular;  Laterality: N/A;   ROTATOR CUFF REPAIR     TEE WITHOUT CARDIOVERSION N/A 06/24/2017   Procedure: TRANSESOPHAGEAL ECHOCARDIOGRAM (TEE) WITH LOOP;  Surgeon: Elease Hashimoto Deloris Ping, MD;  Location: [facility] ENDOSCOPY;  Service: Cardiovascular;  Laterality: N/A;   TEE WITHOUT CARDIOVERSION N/A 01/08/2022   Procedure: TRANSESOPHAGEAL ECHOCARDIOGRAM (TEE);  Surgeon: Corliss Skains, MD;  Location[facility] OR;  Service: Open Heart Surgery;  Laterality: N/A;    Family History  Problem Relation Age of Onset   Other Mother        reports in good health   Other Father        was estranged from family, unknown medical history    Allergies  Allergen Reactions   Flomax [Tamsulosin Hcl] Other (See Comments)    HYPOtension and syncope    Current Outpatient Medications on File Prior to Visit  Medication Sig Dispense Refill   aspirin EC 81 MG EC tablet Take 1 tablet (81 mg total) by mouth daily. Swallow whole. 30 tablet 11   atorvastatin (LIPITOR) 40 MG tablet Take 1 tablet (40 mg total) by mouth daily at 6 PM. 90 tablet 2   carvedilol (COREG) 6.25 MG tablet TAKE 1 TABLET BY MOUTH 2 TIMES A DAY WITH A MEAL 90 tablet 3   dapagliflozin propanediol (FARXIGA) 10 MG TABS tablet Take 1 tablet (10 mg total) by mouth daily before breakfast. 30 tablet 10   ENTRESTO 97-103 MG TAKE  1 TABLET BY MOUTH 2 TIMES A DAY 180 tablet 3   levocetirizine (XYZAL) 5 MG tablet Take 5 mg by mouth daily as needed for allergies.     metFORMIN (GLUCOPHAGE) 500 MG tablet Take 1 tablet (500 mg total) by mouth daily with breakfast. 90 tablet 0   sertraline (ZOLOFT) 100 MG tablet TAKE 1 TABLET BY MOUTH EVERY DAY 90 tablet 3   spironolactone (ALDACTONE) 25 MG tablet TAKE 1/2 TABLET (12.5 MG) BY MOUTH DAILY 45 tablet 3   No current facility-administered medications on file prior to visit.    BP 112/68   Pulse Marland Kitchen)  57   Temp 98 F (36.7 C)   Resp 18   Ht 5\' 4"  (1.626 m)   Wt 235 lb (106.6 kg)   SpO2 99%   BMI 40.34 kg/m        Objective:   Physical Exam  General Mental Status- Alert. General Appearance- Not in acute distress.   Skin General: Color- Normal Color. Moisture- Normal Moisture.  Neck Carotid Arteries- Normal color. Moisture- Normal Moisture. No carotid bruits. No JVD.  Chest and Lung Exam Auscultation: Breath Sounds:-Normal.  Cardiovascular Auscultation:Rythm- Regular. Murmurs & Other Heart Sounds:Auscultation of the heart reveals- No Murmurs.  Abdomen Inspection:-Inspeection Normal. Palpation/Percussion:Note:No mass. Palpation and Percussion of the abdomen reveal- Non Tender, Non Distended + BS, no rebound or guarding.   Neurologic Cranial Nerve exam:- CN III-XII intact(No nystagmus), symmetric smile. Strength:- 5/5 equal and symmetric strength both upper and lower extremities.       Assessment & Plan:   Patient Instructions  Htn- pt still on coreg. Bp is on low side initially but better on rehcek. No report of dizziness on standing. Continue coreg. -cmp  DM type 2, controlled, with complication (HCC) Continue metformin and  farxiga. As well as low sugar diet. Get A1c and cmp today  Coronary artery disease involving native coronary artery of native heart, unspecified whether angina present  -lipid panel today. On Atorvastatin  daily.   Chronic systolic congestive heart failure (HCC)  -coreg 6.25 mg bid, spironolactone 25 mg 1/2 tab, daily,  aspirin 81 mg daily and entresto daily. - bnp lab  After review of lab may adjust treatment regimen.  Follow up date to be determined after lab review.   Esperanza Richters, PA-C

## 2023-07-11 NOTE — Addendum Note (Signed)
Addended by: Maximino Sarin on: 07/11/2023 11:57 AM   Modules accepted: Orders

## 2023-08-04 ENCOUNTER — Other Ambulatory Visit (HOSPITAL_BASED_OUTPATIENT_CLINIC_OR_DEPARTMENT_OTHER): Payer: Self-pay

## 2023-08-06 ENCOUNTER — Telehealth: Payer: Self-pay | Admitting: Medical

## 2023-08-06 NOTE — Telephone Encounter (Signed)
Copied from CRM [id number]. Topic: Medicare AWV >> Aug 06, 2023  3:17 PM Payton Doughty wrote: Reason for CRM: Called LM 08/06/2023 to schedule AWV   Verlee Rossetti; Care Guide Ambulatory Clinical Support [place] [facility] Direct Dial: [phone]

## 2023-08-08 ENCOUNTER — Encounter: Payer: Self-pay | Admitting: Medical

## 2023-08-08 MED ORDER — METFORMIN HCL 500 MG PO TABS: 500.0000 mg | ORAL_TABLET | Freq: Every day | ORAL | Status: DC

## 2023-08-11 ENCOUNTER — Other Ambulatory Visit: Payer: Self-pay | Admitting: Medical

## 2023-09-05 ENCOUNTER — Other Ambulatory Visit (HOSPITAL_BASED_OUTPATIENT_CLINIC_OR_DEPARTMENT_OTHER): Payer: Self-pay

## 2023-09-16 ENCOUNTER — Other Ambulatory Visit: Payer: Self-pay | Admitting: Cardiovascular Disease

## 2023-10-24 ENCOUNTER — Encounter: Payer: Self-pay | Admitting: Medical

## 2023-10-24 ENCOUNTER — Ambulatory Visit (INDEPENDENT_AMBULATORY_CARE_PROVIDER_SITE_OTHER): Payer: Medicare HMO | Admitting: Medical

## 2023-10-24 ENCOUNTER — Ambulatory Visit (HOSPITAL_BASED_OUTPATIENT_CLINIC_OR_DEPARTMENT_OTHER)
Admission: RE | Admit: 2023-10-24 | Discharge: 2023-10-24 | Disposition: A | Discharge status: Home / Self Care | Payer: Medicare HMO | Source: Ambulatory Visit | Attending: Medical | Admitting: Medical

## 2023-10-24 VITALS — BP 128/72 | HR 62 | Temp 98.3°F | Resp 18 | Ht 64.0 in | Wt 236.0 lb

## 2023-10-24 DIAGNOSIS — E118 Type 2 diabetes mellitus with unspecified complications: Secondary | ICD-10-CM

## 2023-10-24 DIAGNOSIS — R059 Cough, unspecified: Secondary | ICD-10-CM

## 2023-10-24 DIAGNOSIS — R062 Wheezing: Secondary | ICD-10-CM | POA: Diagnosis not present

## 2023-10-24 DIAGNOSIS — J9811 Atelectasis: Secondary | ICD-10-CM | POA: Diagnosis not present

## 2023-10-24 DIAGNOSIS — J40 Bronchitis, not specified as acute or chronic: Secondary | ICD-10-CM | POA: Diagnosis not present

## 2023-10-24 DIAGNOSIS — I5022 Chronic systolic (congestive) heart failure: Secondary | ICD-10-CM

## 2023-10-24 DIAGNOSIS — R0989 Other specified symptoms and signs involving the circulatory and respiratory systems: Secondary | ICD-10-CM | POA: Diagnosis not present

## 2023-10-24 LAB — HEMOGLOBIN A1C: Hgb A1c MFr Bld: 7.4 % — ABNORMAL HIGH (ref 4.6–6.5)

## 2023-10-24 LAB — COMPREHENSIVE METABOLIC PANEL
ALT: 14 U/L (ref 0–53)
AST: 16 U/L (ref 0–37)
Albumin: 4.1 g/dL (ref 3.5–5.2)
Alkaline Phosphatase: 37 U/L — ABNORMAL LOW (ref 39–117)
BUN: 15 mg/dL (ref 6–23)
CO2: 29 meq/L (ref 19–32)
Calcium: 8.8 mg/dL (ref 8.4–10.5)
Chloride: 100 meq/L (ref 96–112)
Creatinine, Ser: 1.22 mg/dL (ref 0.40–1.50)
GFR: 58.6 mL/min — ABNORMAL LOW (ref >60.00)
Glucose, Bld: 213 mg/dL — ABNORMAL HIGH (ref 70–99)
Potassium: 4.2 meq/L (ref 3.5–5.1)
Sodium: 135 meq/L (ref 135–145)
Total Bilirubin: 1.2 mg/dL (ref 0.2–1.2)
Total Protein: 6.5 g/dL (ref 6.0–8.3)

## 2023-10-24 LAB — CBC WITH DIFFERENTIAL/PLATELET
Basophils Absolute: 0 10*3/uL (ref 0.0–0.1)
Basophils Relative: 0.6 % (ref 0.0–3.0)
Eosinophils Absolute: 0.4 10*3/uL (ref 0.0–0.7)
Eosinophils Relative: 4.8 % (ref 0.0–5.0)
HCT: 46.6 % (ref 39.0–52.0)
Hemoglobin: 15.9 g/dL (ref 13.0–17.0)
Lymphocytes Relative: 12.5 % (ref 12.0–46.0)
Lymphs Abs: 1 10*3/uL (ref 0.7–4.0)
MCHC: 34.1 g/dL (ref 30.0–36.0)
MCV: 88.9 fL (ref 78.0–100.0)
Monocytes Absolute: 0.8 10*3/uL (ref 0.1–1.0)
Monocytes Relative: 10.1 % (ref 3.0–12.0)
Neutro Abs: 5.8 10*3/uL (ref 1.4–7.7)
Neutrophils Relative %: 72 % (ref 43.0–77.0)
Platelets: 139 10*3/uL — ABNORMAL LOW (ref 150.0–400.0)
RBC: 5.24 Mil/uL (ref 4.22–5.81)
RDW: 13.2 % (ref 11.5–15.5)
WBC: 8 10*3/uL (ref 4.0–10.5)

## 2023-10-24 LAB — POC COVID19 BINAXNOW: SARS Coronavirus 2 Ag: NEGATIVE

## 2023-10-24 LAB — BRAIN NATRIURETIC PEPTIDE: Pro B Natriuretic peptide (BNP): 91 pg/mL (ref 0.0–100.0)

## 2023-10-24 MED ORDER — METHYLPREDNISOLONE 4 MG PO TABS: ORAL_TABLET | ORAL | 0 refills | Status: DC

## 2023-10-24 MED ORDER — AZITHROMYCIN 250 MG PO TABS: ORAL_TABLET | ORAL | 0 refills | Status: AC

## 2023-10-24 MED ORDER — BUDESONIDE-FORMOTEROL FUMARATE 160-4.5 MCG/ACT IN AERO: 2.0000 | INHALATION_SPRAY | Freq: Two times a day (BID) | RESPIRATORY_TRACT | 12 refills | Status: AC

## 2023-10-24 MED ORDER — BENZONATATE 100 MG PO CAPS: 100.0000 mg | ORAL_CAPSULE | Freq: Three times a day (TID) | ORAL | 0 refills | Status: AC | PRN

## 2023-10-24 MED ORDER — FLUTICASONE PROPIONATE 50 MCG/ACT NA SUSP: 2.0000 | Freq: Every day | NASAL | 1 refills | Status: DC

## 2023-10-24 MED ORDER — ALBUTEROL SULFATE HFA 108 (90 BASE) MCG/ACT IN AERS: 2.0000 | INHALATION_SPRAY | Freq: Four times a day (QID) | RESPIRATORY_TRACT | 0 refills | Status: AC | PRN

## 2023-10-24 NOTE — Progress Notes (Signed)
Subjective:    Patient ID: Raymond [name], male    DOB: 1951/02/16, 72 y.o.   MRN: [id number]  HPI  Discussed the use of AI scribe software for clinical note transcription with the patient, who gave verbal consent to proceed.  History of Present Illness   The patient, with a history of CHF, asthma, and a triple bypass surgery, presents with a recurrent December illness. He reports a progression of symptoms starting with sinus congestion and drainage, which has since moved into his chest. He describes a non-productive cough, predominantly on the right side, and significant wheezing, particularly in the morning. He denies fever, chills, and body aches.  The patient has a history of rapid progression to pneumonia, resulting in hospitalization. He also has a history of sinus infections. He reports no current sinus pressure, but had significant drainage three days ago.  The patient has a history of asthma from childhood, which tends to flare with colds or bronchitis. He does not currently have any inhalers at home, but has used them in the past.  The patient has a minimal smoking history, smoking approximately one cigar a month. He also has a history of CHF, which flared around the time of his triple bypass surgery.  The patient's blood sugar levels have been fine recently, checking about once a month. He has been taking over-the-counter cough medicine for diabetes patients, which has provided minimal relief.         Review of Systems  Constitutional:  Negative for chills, fatigue and fever.  HENT:  Positive for congestion. Negative for ear pain.   Respiratory:  Positive for cough and wheezing.   Cardiovascular:  Negative for chest pain and palpitations.  Gastrointestinal:  Negative for abdominal pain, nausea and vomiting.  Musculoskeletal:  Negative for back pain, myalgias and neck stiffness.  Skin:  Negative for rash.  Neurological:  Negative for dizziness, seizures and headaches.   Hematological:  Negative for adenopathy. Does not bruise/bleed easily.  Psychiatric/Behavioral:  Negative for behavioral problems.    Past Medical History:  Diagnosis Date   Arthritis    Depression    Diabetes mellitus type 2 in obese    Hypertension    Migraines    Sleep apnea    Stroke [facility])      Social History   Socioeconomic History   Marital status: Married    Spouse name: [name]   Number of children: Not on file   Years of education: Not on file   Highest education level: Associate degree: academic program  Occupational History   Occupation: retired    Comment: Charity fundraiser  Tobacco Use   Smoking status: Former    Types: Cigars    Quit date: 01/17/2022    Years since quitting: 1.7   Smokeless tobacco: Never   Tobacco comments:    chew on cigars its not lit  Substance and Sexual Activity   Alcohol use: No    Alcohol/week: 0.0 standard drinks of alcohol   Drug use: No   Sexual activity: Not Currently  Other Topics Concern   Not on file  Social History Narrative   Not on file   Social Drivers of Health   Financial Resource Strain: Low Risk  (10/24/2023)   Overall Financial Resource Strain (CARDIA)    Difficulty of Paying Living Expenses: Not very hard  Food Insecurity: No Food Insecurity (10/24/2023)   Hunger Vital Sign    Worried About Running Out of Food in the Last Year: Never  true    Ran Out of Food in the Last Year: Never true  Transportation Needs: No Transportation Needs (10/24/2023)   PRAPARE - Administrator, Civil Service (Medical): No    Lack of Transportation (Non-Medical): No  Physical Activity: Unknown (10/24/2023)   Exercise Vital Sign    Days of Exercise per Week: 0 days    Minutes of Exercise per Session: Not on file  Stress: No Stress Concern Present (10/24/2023)   [employer] of Occupational Health - Occupational Stress Questionnaire    Feeling of Stress : Not at all  Social Connections: Socially Isolated (10/24/2023)    Social Connection and Isolation Panel [NHANES]    Frequency of Communication with Friends and Family: Once a week    Frequency of Social Gatherings with Friends and Family: Once a week    Attends Religious Services: Never    Database administrator or Organizations: No    Attends Engineer, structural: Not on file    Marital Status: Married  Catering manager Violence: Not At Risk (08/05/2022)   Humiliation, Afraid, Rape, and Kick questionnaire    Fear of Current or Ex-Partner: No    Emotionally Abused: No    Physically Abused: No    Sexually Abused: No    Past Surgical History:  Procedure Laterality Date   CHOLECYSTECTOMY     CORONARY ANGIOGRAPHY N/A 01/07/2022   Procedure: CORONARY ANGIOGRAPHY;  Surgeon: Runell Gess, MD;  Location: [facility];  Service: Cardiovascular;  Laterality: N/A;   CORONARY ARTERY BYPASS GRAFT N/A 01/08/2022   Procedure: CORONARY ARTERY BYPASS GRAFTING (CABG) TIMES THREE USING LEFT INTERNAL MAMMARY ARTERY AND ENDOSCOPICALLY HARVESTED BILATERAL GREATER SAPHENOUS VEINS;  Surgeon: Corliss Skains, MD;  Location: [facility] OR;  Service: Open Heart Surgery;  Laterality: N/A;   ENDOVEIN HARVEST OF GREATER SAPHENOUS VEIN Right 01/08/2022   Procedure: ENDOVEIN HARVEST OF GREATER SAPHENOUS VEIN;  Surgeon: Corliss Skains, MD;  Location: [facility] OR;  Service: Open Heart Surgery;  Laterality: Right;   KNEE ARTHROSCOPY     LOOP RECORDER INSERTION N/A 06/24/2017   Procedure: LOOP RECORDER INSERTION;  Surgeon: Hillis Range, MD;  Location: [facility];  Service: Cardiovascular;  Laterality: N/A;   ROTATOR CUFF REPAIR     TEE WITHOUT CARDIOVERSION N/A 06/24/2017   Procedure: TRANSESOPHAGEAL ECHOCARDIOGRAM (TEE) WITH LOOP;  Surgeon: Elease Hashimoto Deloris Ping, MD;  Location: [facility] ENDOSCOPY;  Service: Cardiovascular;  Laterality: N/A;   TEE WITHOUT CARDIOVERSION N/A 01/08/2022   Procedure: TRANSESOPHAGEAL ECHOCARDIOGRAM (TEE);  Surgeon: Corliss Skains, MD;  Location:  [facility] OR;  Service: Open Heart Surgery;  Laterality: N/A;    Family History  Problem Relation Age of Onset   Other Mother        reports in good health   Other Father        was estranged from family, unknown medical history    Allergies  Allergen Reactions   Flomax [Tamsulosin Hcl] Other (See Comments)    HYPOtension and syncope    Current Outpatient Medications on File Prior to Visit  Medication Sig Dispense Refill   aspirin EC 81 MG EC tablet Take 1 tablet (81 mg total) by mouth daily. Swallow whole. 30 tablet 11   atorvastatin (LIPITOR) 40 MG tablet Take 1 tablet (40 mg total) by mouth daily at 6 PM. 90 tablet 2   carvedilol (COREG) 6.25 MG tablet TAKE 1 TABLET BY MOUTH 2 TIMES A DAY WITH MEALS 90 tablet  3   dapagliflozin propanediol (FARXIGA) 10 MG TABS tablet Take 1 tablet (10 mg total) by mouth daily before breakfast. 30 tablet 10   ENTRESTO 97-103 MG TAKE 1 TABLET BY MOUTH 2 TIMES A DAY 180 tablet 3   levocetirizine (XYZAL) 5 MG tablet Take 5 mg by mouth daily as needed for allergies.     metFORMIN (GLUCOPHAGE) 500 MG tablet TAKE 1 TABLET BY MOUTH 2 TIMES A DAY 180 tablet 0   sertraline (ZOLOFT) 100 MG tablet TAKE 1 TABLET BY MOUTH EVERY DAY 90 tablet 3   spironolactone (ALDACTONE) 25 MG tablet TAKE 1/2 TABLET (12.5 MG) BY MOUTH DAILY 45 tablet 3   No current facility-administered medications on file prior to visit.    BP 128/72   Pulse 62   Temp 98.3 F (36.8 C)   Resp 18   Ht 5\' 4"  (1.626 m)   Wt 236 lb (107 kg)   SpO2 96%   BMI 40.51 kg/m        Objective:   Physical Exam  General Mental Status- Alert. General Appearance- Not in acute distress.   Skin General: Color- Normal Color. Moisture- Normal Moisture.  Neck Carotid Arteries- Normal color. Moisture- Normal Moisture. No carotid bruits. No JVD.  Chest and Lung Exam Auscultation: Breath Sounds:-Expiratory wheezing noted bilaterally, right lung rough compared to left. Even unlabored. Not  labored  Cardiovascular Auscultation:Rythm- RRR Murmurs & Other Heart Sounds:Auscultation of the heart reveals- No Murmurs.  Abdomen Inspection:-Inspeection  Palpation/Percussion:Note:No mass. Palpation and Percussion of the abdomen reveal- Non Tender, Non Distended + BS, no rebound or guarding.    Neurologic Cranial Nerve exam:- CN III-XII intact(No nystagmus), symmetric smile. Strength:- 5/5 equal and symmetric strength both upper and lower extremities.   Lower ext- calfs symmetric, no edema. Negative homans signs.     Assessment & Plan:  Assessment and Plan    Bronchitis with History of Rapid Progression to Pneumonia Patient presents with 3-day history of bronchitis symptoms progressing to wheezing and productive cough, predominantly on the right side. History of asthma and rapid progression to pneumonia in the past. No fever, chills, or body aches. COVID-19 swab negative. -Order chest x-ray to assess for pneumonia and CHF findings. -Start Azithromycin. -Prescribe Benzonatate for cough. -Prescribe Flonase for nasal congestion. -Prescribe Symbicort (2 inhalations BID) and Albuterol (every 4-6 hours PRN). -Consider low-dose Medrol taper pending chest x-ray results, with caution due to history of CHF and potential impact on blood sugar levels.  Diabetes Patient checks blood sugar approximately once a month. Last A1C was within normal limits. -Order metabolic panel and A1C. -If Medrol is prescribed, instruct patient to check blood sugar twice daily and report if levels exceed 200.  Congestive Heart Failure (CHF)(note no chest pain or associated cardiac symptoms. no leg pain) History of CHF flare during previous cardiac event. No current symptoms suggestive of CHF exacerbation. -Order BNP to assess for CHF exacerbation. -Consider impact of potential Medrol prescription on CHF status.  Chronic Obstructive Pulmonary Disease (COPD) Diagnosis of COPD noted in chart, but patient  does not recall diagnosis and recent imaging did not show COPD findings. Minimal smoking history. -Clarify COPD diagnosis in future visit.  Follow-up Await results of chest x-ray, metabolic panel, and A1C. Make decision on potential Medrol prescription based on these results and current symptoms.   Follow up Tuesday  morning . if signs/symptoms worsen or change be seen in the ED.   Esperanza Richters, PA-C

## 2023-10-24 NOTE — Patient Instructions (Addendum)
Bronchitis with History of Rapid Progression to Pneumonia Patient presents with 3-day history of bronchitis symptoms progressing to wheezing and productive cough, predominantly on the right side. History of asthma and rapid progression to pneumonia in the past. No fever, chills, or body aches. COVID-19 swab negative. -Order chest x-ray to assess for pneumonia and CHF findings. -Start Azithromycin. -Prescribe Benzonatate for cough. -Prescribe Flonase for nasal congestion. -Prescribe Symbicort (2 inhalations BID) and Albuterol (every 4-6 hours PRN). -Consider low-dose Medrol taper pending chest x-ray results, with caution due to history of CHF and potential impact on blood sugar levels.  Diabetes Patient checks blood sugar approximately once a month. Last A1C was within normal limits. -Order metabolic panel and A1C. -If Medrol is prescribed, instruct patient to check blood sugar twice daily and report if levels exceed 200.  Congestive Heart Failure (CHF)(note no chest pain or associated cardiac symptoms. no leg pain) History of CHF flare during previous cardiac event. No current symptoms suggestive of CHF exacerbation. -Order BNP to assess for CHF exacerbation. -Consider impact of potential Medrol prescription on CHF status.  Chronic Obstructive Pulmonary Disease (COPD) Diagnosis of COPD noted in chart, but patient does not recall diagnosis and recent imaging did not show COPD findings. Minimal smoking history. -Clarify COPD diagnosis in future visit.  Follow-up Await results of chest x-ray, metabolic panel, and A1C. Make decision on potential Medrol prescription based on these results and current symptoms.   Follow up Tuesday  morning . if signs/symptoms worsen or change be seen in the ED.

## 2023-10-24 NOTE — Addendum Note (Signed)
Addended by: Gwenevere Abbot on: 10/24/2023 12:35 PM   Modules accepted: Orders

## 2023-10-28 ENCOUNTER — Ambulatory Visit (INDEPENDENT_AMBULATORY_CARE_PROVIDER_SITE_OTHER): Payer: Medicare HMO | Admitting: Medical

## 2023-10-28 VITALS — BP 155/62 | HR 51 | Temp 98.1°F | Resp 20 | Wt 236.0 lb

## 2023-10-28 DIAGNOSIS — H6990 Unspecified Eustachian tube disorder, unspecified ear: Secondary | ICD-10-CM | POA: Diagnosis not present

## 2023-10-28 DIAGNOSIS — R062 Wheezing: Secondary | ICD-10-CM | POA: Diagnosis not present

## 2023-10-28 DIAGNOSIS — J40 Bronchitis, not specified as acute or chronic: Secondary | ICD-10-CM | POA: Diagnosis not present

## 2023-10-28 NOTE — Patient Instructions (Signed)
Bronchitis Improvement in symptoms with treatment. Chest X-ray showed no signs of pneumonia or CHF. Expiratory wheezing not present today on exam -Continue with current medications:Azithromycin, Benzonatate, Flonase, Symbicort, and Albuterol. -Complete Medrol 6-day taper.  Eustachian Tube Dysfunction New onset of bilateral ear pressure. No signs of infection on examination. -Continue Flonase 2 sprays each nostril once daily.  Hyperglycemia Spike in blood glucose levels during high dose days of Medrol taper, reaching a maximum of nearly 500. Levels have been decreasing with continuation of diabetic medications and low sugar diet. no on much lower dose. -Continue diabetic medications and low sugar diet. -Monitor blood glucose levels. At this point don't expect spike as on low dose.  Follow-up as needed.

## 2023-10-28 NOTE — Progress Notes (Signed)
Subjective:    Patient ID: Raymond [name], male    DOB: November 30, 1950, 72 y.o.   MRN: [id number]  HPI Discussed the use of AI scribe software for clinical note transcription with the patient, who gave verbal consent to proceed.  History of Present Illness   The patient, with a recent history of bronchitis-like symptoms, presented for a follow-up visit. There was a concern about rapid progression to pneumonia, but a chest x-ray performed on the 20th showed no signs of pneumonia or CHF. The patient had surgical clips from a previous procedure, and the x-ray showed borderline heart size and some bronchovascular crowding,. Some under inflation of the lungs during the x-ray.  The patient was still experiencing some wheezing, but less than at the previous visit. He was able to sleep well with the aid of a CPAP machine. The patient was prescribed azithromycin, benzonatate, Flonase, Symbicort, albuterol, and a six-day taper of Medrol. At the time of the visit, the patient was down to the last two days of the Medrol taper. He is breathing alot better now. Only occasional slight wheeze occasionally now. Prior was constantly wheezing.  The patient also has diabetes, and his blood sugar levels spiked while on the Medrol, reaching almost 500 at the highest point. He did not contact me. I had wanted update if sugars went over 200.  However, the levels have been decreasing each day . Yesterday sugar was 169 and today was 150. , and the patient has been maintaining a low sugar diet.  In addition to the bronchitis-like symptoms, the patient also reported new ear pressure on both sides, more so on the right. The patient's ears did not appear infected, and the pressure was attributed to Eustachian tube dysfunction.        Review of Systems  Constitutional:  Negative for chills, fatigue and fever.  HENT:  Negative for congestion, ear discharge and ear pain.        Ear pressure and decreased hearing.  Respiratory:   Negative for cough, chest tightness and wheezing.        Rare wheeze now.  Cardiovascular:  Negative for chest pain and palpitations.  Gastrointestinal:  Negative for abdominal pain.  Musculoskeletal:  Negative for back pain, myalgias and neck stiffness.  Skin:  Negative for rash.  Neurological:  Negative for dizziness, light-headedness and headaches.  Hematological:  Negative for adenopathy. Does not bruise/bleed easily.  Psychiatric/Behavioral:  Negative for behavioral problems and confusion.     Past Medical History:  Diagnosis Date   Arthritis    Depression    Diabetes mellitus type 2 in obese    Hypertension    Migraines    Sleep apnea    Stroke [facility])      Social History   Socioeconomic History   Marital status: Married    Spouse name: [name]   Number of children: Not on file   Years of education: Not on file   Highest education level: Associate degree: academic program  Occupational History   Occupation: retired    Comment: Charity fundraiser  Tobacco Use   Smoking status: Former    Types: Cigars    Quit date: 01/17/2022    Years since quitting: 1.7   Smokeless tobacco: Never   Tobacco comments:    chew on cigars its not lit  Substance and Sexual Activity   Alcohol use: No    Alcohol/week: 0.0 standard drinks of alcohol   Drug use: No   Sexual activity: Not  Currently  Other Topics Concern   Not on file  Social History Narrative   Not on file   Social Drivers of Health   Financial Resource Strain: Low Risk  (10/24/2023)   Overall Financial Resource Strain (CARDIA)    Difficulty of Paying Living Expenses: Not very hard  Food Insecurity: No Food Insecurity (10/24/2023)   Hunger Vital Sign    Worried About Running Out of Food in the Last Year: Never true    Ran Out of Food in the Last Year: Never true  Transportation Needs: No Transportation Needs (10/24/2023)   PRAPARE - Administrator, Civil Service (Medical): No    Lack of Transportation (Non-Medical): No   Physical Activity: Unknown (10/24/2023)   Exercise Vital Sign    Days of Exercise per Week: 0 days    Minutes of Exercise per Session: Not on file  Stress: No Stress Concern Present (10/24/2023)   [employer] of Occupational Health - Occupational Stress Questionnaire    Feeling of Stress : Not at all  Social Connections: Socially Isolated (10/24/2023)   Social Connection and Isolation Panel [NHANES]    Frequency of Communication with Friends and Family: Once a week    Frequency of Social Gatherings with Friends and Family: Once a week    Attends Religious Services: Never    Database administrator or Organizations: No    Attends Engineer, structural: Not on file    Marital Status: Married  Catering manager Violence: Not At Risk (08/05/2022)   Humiliation, Afraid, Rape, and Kick questionnaire    Fear of Current or Ex-Partner: No    Emotionally Abused: No    Physically Abused: No    Sexually Abused: No    Past Surgical History:  Procedure Laterality Date   CHOLECYSTECTOMY     CORONARY ANGIOGRAPHY N/A 01/07/2022   Procedure: CORONARY ANGIOGRAPHY;  Surgeon: Runell Gess, MD;  Location: [facility];  Service: Cardiovascular;  Laterality: N/A;   CORONARY ARTERY BYPASS GRAFT N/A 01/08/2022   Procedure: CORONARY ARTERY BYPASS GRAFTING (CABG) TIMES THREE USING LEFT INTERNAL MAMMARY ARTERY AND ENDOSCOPICALLY HARVESTED BILATERAL GREATER SAPHENOUS VEINS;  Surgeon: Corliss Skains, MD;  Location: [facility] OR;  Service: Open Heart Surgery;  Laterality: N/A;   ENDOVEIN HARVEST OF GREATER SAPHENOUS VEIN Right 01/08/2022   Procedure: ENDOVEIN HARVEST OF GREATER SAPHENOUS VEIN;  Surgeon: Corliss Skains, MD;  Location: [facility] OR;  Service: Open Heart Surgery;  Laterality: Right;   KNEE ARTHROSCOPY     LOOP RECORDER INSERTION N/A 06/24/2017   Procedure: LOOP RECORDER INSERTION;  Surgeon: Hillis Range, MD;  Location: [facility];  Service: Cardiovascular;  Laterality: N/A;    ROTATOR CUFF REPAIR     TEE WITHOUT CARDIOVERSION N/A 06/24/2017   Procedure: TRANSESOPHAGEAL ECHOCARDIOGRAM (TEE) WITH LOOP;  Surgeon: Elease Hashimoto Deloris Ping, MD;  Location[facility] ENDOSCOPY;  Service: Cardiovascular;  Laterality: N/A;   TEE WITHOUT CARDIOVERSION N/A 01/08/2022   Procedure: TRANSESOPHAGEAL ECHOCARDIOGRAM (TEE);  Surgeon: Corliss Skains, MD;  Location: [facility] OR;  Service: Open Heart Surgery;  Laterality: N/A;    Family History  Problem Relation Age of Onset   Other Mother        reports in good health   Other Father        was estranged from family, unknown medical history    Allergies  Allergen Reactions   Flomax [Tamsulosin Hcl] Other (See Comments)    HYPOtension and syncope  Current Outpatient Medications on File Prior to Visit  Medication Sig Dispense Refill   albuterol (VENTOLIN HFA) 108 (90 Base) MCG/ACT inhaler Inhale 2 puffs into the lungs every 6 (six) hours as needed. 18 g 0   aspirin EC 81 MG EC tablet Take 1 tablet (81 mg total) by mouth daily. Swallow whole. 30 tablet 11   atorvastatin (LIPITOR) 40 MG tablet Take 1 tablet (40 mg total) by mouth daily at 6 PM. 90 tablet 2   azithromycin (ZITHROMAX) 250 MG tablet Take 2 tablets on day 1, then 1 tablet daily on days 2 through 5 6 tablet 0   benzonatate (TESSALON) 100 MG capsule Take 1 capsule (100 mg total) by mouth 3 (three) times daily as needed for cough. 30 capsule 0   budesonide-formoterol (SYMBICORT) 160-4.5 MCG/ACT inhaler Inhale 2 puffs into the lungs 2 (two) times daily. 1 each 12   carvedilol (COREG) 6.25 MG tablet TAKE 1 TABLET BY MOUTH 2 TIMES A DAY WITH MEALS 90 tablet 3   dapagliflozin propanediol (FARXIGA) 10 MG TABS tablet Take 1 tablet (10 mg total) by mouth daily before breakfast. 30 tablet 10   ENTRESTO 97-103 MG TAKE 1 TABLET BY MOUTH 2 TIMES A DAY 180 tablet 3   fluticasone (FLONASE) 50 MCG/ACT nasal spray Place 2 sprays into both nostrils daily. 16 g 1   levocetirizine (XYZAL) 5 MG tablet  Take 5 mg by mouth daily as needed for allergies.     metFORMIN (GLUCOPHAGE) 500 MG tablet TAKE 1 TABLET BY MOUTH 2 TIMES A DAY 180 tablet 0   methylPREDNISolone (MEDROL) 4 MG tablet Standard 6 day taper. 21 tablet 0   sertraline (ZOLOFT) 100 MG tablet TAKE 1 TABLET BY MOUTH EVERY DAY 90 tablet 3   spironolactone (ALDACTONE) 25 MG tablet TAKE 1/2 TABLET (12.5 MG) BY MOUTH DAILY 45 tablet 3   No current facility-administered medications on file prior to visit.    BP (!) 155/62   Pulse (!) 51   Temp 98.1 F (36.7 C)   Resp 20   Wt 236 lb (107 kg)   SpO2 100%   BMI 40.51 kg/m        Objective:   Physical Exam  General- No acute distress. Pleasant patient. Neck- Full range of motion, no jvd Lungs- Clear, even and unlabored. Heart- regular rate and rhythm. Neurologic- CNII- XII grossly intact.  Heent- no sinus pressure. Normal tms. No wax. No mastoid or tragal tenderness. Does sound congested nasally. Legs- symmetric. No pedal edema. Negative homans signs.       Assessment & Plan:   Assessment and Plan           Assessment and Plan    Bronchitis Improvement in symptoms with treatment. Chest X-ray showed no signs of pneumonia or CHF. Expiratory wheezing not present today on exam -Continue with current medications:Azithromycin, Benzonatate, Flonase, Symbicort, and Albuterol. -Complete Medrol 6-day taper.  Eustachian Tube Dysfunction New onset of bilateral ear pressure. No signs of infection on examination. -Continue Flonase 2 sprays each nostril once daily.  Hyperglycemia Spike in blood glucose levels during high dose days of Medrol taper, reaching a maximum of nearly 500. Levels have been decreasing with continuation of diabetic medications and low sugar diet. no on much lower dose. -Continue diabetic medications and low sugar diet. -Monitor blood glucose levels. At this point don't expect spike as on low dose.  Follow-up as needed.

## 2023-11-11 ENCOUNTER — Other Ambulatory Visit: Payer: Self-pay | Admitting: Medical

## 2023-11-17 ENCOUNTER — Other Ambulatory Visit: Payer: Self-pay | Admitting: Nurse Practitioner

## 2023-11-17 DIAGNOSIS — I10 Essential (primary) hypertension: Secondary | ICD-10-CM

## 2023-11-28 ENCOUNTER — Other Ambulatory Visit (HOSPITAL_BASED_OUTPATIENT_CLINIC_OR_DEPARTMENT_OTHER): Payer: Self-pay

## 2024-01-05 ENCOUNTER — Other Ambulatory Visit (HOSPITAL_BASED_OUTPATIENT_CLINIC_OR_DEPARTMENT_OTHER): Payer: Self-pay

## 2024-01-16 ENCOUNTER — Other Ambulatory Visit: Payer: Self-pay | Admitting: Cardiovascular Disease

## 2024-02-02 ENCOUNTER — Other Ambulatory Visit (HOSPITAL_COMMUNITY): Payer: Self-pay | Admitting: Adult Health

## 2024-02-02 NOTE — Telephone Encounter (Signed)
 Ok to refill through me

## 2024-02-11 ENCOUNTER — Other Ambulatory Visit: Payer: Self-pay | Admitting: Medical

## 2024-02-13 ENCOUNTER — Ambulatory Visit (INDEPENDENT_AMBULATORY_CARE_PROVIDER_SITE_OTHER): Admitting: Medical

## 2024-02-13 VITALS — BP 126/74 | HR 53 | Resp 18 | Ht 64.0 in | Wt 230.0 lb

## 2024-02-13 DIAGNOSIS — E785 Hyperlipidemia, unspecified: Secondary | ICD-10-CM | POA: Diagnosis not present

## 2024-02-13 DIAGNOSIS — Z1211 Encounter for screening for malignant neoplasm of colon: Secondary | ICD-10-CM

## 2024-02-13 DIAGNOSIS — I5022 Chronic systolic (congestive) heart failure: Secondary | ICD-10-CM

## 2024-02-13 DIAGNOSIS — I10 Essential (primary) hypertension: Secondary | ICD-10-CM

## 2024-02-13 DIAGNOSIS — E118 Type 2 diabetes mellitus with unspecified complications: Secondary | ICD-10-CM

## 2024-02-13 DIAGNOSIS — R350 Frequency of micturition: Secondary | ICD-10-CM | POA: Diagnosis not present

## 2024-02-13 DIAGNOSIS — Z7984 Long term (current) use of oral hypoglycemic drugs: Secondary | ICD-10-CM | POA: Diagnosis not present

## 2024-02-13 LAB — COMPREHENSIVE METABOLIC PANEL WITH GFR
ALT: 8 U/L (ref 0–53)
AST: 15 U/L (ref 0–37)
Albumin: 4.4 g/dL (ref 3.5–5.2)
Alkaline Phosphatase: 34 U/L — ABNORMAL LOW (ref 39–117)
BUN: 17 mg/dL (ref 6–23)
CO2: 31 meq/L (ref 19–32)
Calcium: 8.8 mg/dL (ref 8.4–10.5)
Chloride: 102 meq/L (ref 96–112)
Creatinine, Ser: 1.13 mg/dL (ref 0.40–1.50)
GFR: 64.11 mL/min (ref >60.00)
Glucose, Bld: 137 mg/dL — ABNORMAL HIGH (ref 70–99)
Potassium: 4.4 meq/L (ref 3.5–5.1)
Sodium: 139 meq/L (ref 135–145)
Total Bilirubin: 0.9 mg/dL (ref 0.2–1.2)
Total Protein: 6.2 g/dL (ref 6.0–8.3)

## 2024-02-13 LAB — LIPID PANEL
Cholesterol: 106 mg/dL (ref 0–200)
HDL: 35.8 mg/dL — ABNORMAL LOW (ref >39.00)
LDL Cholesterol: 42 mg/dL (ref 0–99)
NonHDL: 70.12
Total CHOL/HDL Ratio: 3
Triglycerides: 143 mg/dL (ref 0.0–149.0)
VLDL: 28.6 mg/dL (ref 0.0–40.0)

## 2024-02-13 LAB — MICROALBUMIN / CREATININE URINE RATIO
Creatinine,U: 102.4 mg/dL
Microalb Creat Ratio: 11.6 mg/g (ref 0.0–30.0)
Microalb, Ur: 1.2 mg/dL (ref 0.0–1.9)

## 2024-02-13 LAB — HEMOGLOBIN A1C: Hgb A1c MFr Bld: 6 % (ref 4.6–6.5)

## 2024-02-13 LAB — PSA: PSA: 3.58 ng/mL (ref 0.10–4.00)

## 2024-02-13 NOTE — Progress Notes (Signed)
 Subjective:    Patient ID: Raymond [name], male    DOB: 17-Oct-1951, 73 y.o.   MRN: [id number]  HPI  Discussed the use of AI scribe software for clinical note transcription with the patient, who gave verbal consent to proceed.  History of Present Illness   Raymond [name] is a 73 year old male with a history of CHF and diabetes who presents for a general checkup.  He is currently taking spironolactone 12.5 mg daily and Coreg 6.25 mg twice daily for blood pressure management, which is well controlled. He has an upcoming appointment with his cardiologist, whom he last saw about a year ago. No shortness of breath, orthopnea, or significant weight changes.  He has a history of CHF and is on Entresto. He was previously on Farxiga, but is no longer taking it, possibly due to the prescription running out.  He has diabetes with a recent A1c of 7.4% as of December 20th. He is taking metformin 500 mg twice daily. He has not had a recent diabetic eye exam and has not seen an eye doctor in years.  He experiences frequent urination, which has been ongoing for years, particularly since his heart attack two years ago. He wears a diaper at night due to nocturia and sometimes needs to urinate again immediately after sitting down.  He has a history of stroke, which has affected his right leg more than his left. He has not had a recent colonoscopy, although he has had polyps in the past. He has not seen a podiatrist and does not wish to have his nails trimmed by one. He has not had a recent shingles vaccine, although he recalls having one in the past.         Review of Systems  See hpi    Objective:   Physical Exam  General Mental Status- Alert. General Appearance- Not in acute distress.   Skin General: Color- Normal Color. Moisture- Normal Moisture.  Neck Carotid Arteries- Normal color. Moisture- Normal Moisture. No carotid bruits. No JVD.  Chest and Lung Exam Auscultation: Breath  Sounds:-Normal.  Cardiovascular Auscultation:Rythm- Regular. Murmurs & Other Heart Sounds:Auscultation of the heart reveals- No Murmurs.  Abdomen Inspection:-Inspeection Normal. Palpation/Percussion:Note:No mass. Palpation and Percussion of the abdomen reveal- Non Tender, Non Distended + BS, no rebound or guarding.    Neurologic Cranial Nerve exam:- CN III-XII intact(No nystagmus), symmetric smile. Strength:- 5/5 equal and symmetric strength both upper and lower extremities.   Lower ext- no pedal edema. Calfs symmetric. Negative homans signa. Feet see quality metrics.  Ears- rt side scan think wax on wall. Can see tm and normal. Left side no wax.     Assessment & Plan:  Congestive Heart Failure (CHF) CHF well-managed with current medications. Asymptomatic. Cardiologist follow-up overdue. - Advise cardiologist follow-up. - Continue spironolactone, Coreg, and Entresto.  Type 2 Diabetes Mellitus Type 2 diabetes controlled with metformin. A1c at 7.4%, within target range. Consider Marcelline Deist if A1c increases. - Order A1c and metabolic panel. - Consider Farxiga if A1c elevated. - Refer for diabetic eye exam.  Frequent Urination Chronic frequent urination. Differential includes hyperglycemia, uti or prostatitits. Previous studies normal. - Order PSA and urine culture. - Consider urology referral if labs normal.  General Health Maintenance Due for shingles vaccine, Tdap, diabetic eye exam, and colonoscopy due to history of polyps. - Recommend shingles vaccine and Tdap at pharmacy. - Refer for colonoscopy. - Refer for diabetic eye exam.  On exam ears don't appear to be  blocked by wax. Rt side scan wax. If audiology thinks you need lavage let me know but I don't think wax significnat.  Follow up date to be determined after lab review  Esperanza Richters, PA-C   Time spent with patient today was  41 minutes which consisted of chart revdiew, discussing diagnosis, work up treatment  and documentation.

## 2024-02-13 NOTE — Patient Instructions (Addendum)
 Congestive Heart Failure (CHF) CHF well-managed with current medications. Asymptomatic. Cardiologist follow-up overdue. - Advise cardiologist follow-up. - Continue spironolactone, Coreg, and Entresto.  Type 2 Diabetes Mellitus Type 2 diabetes controlled with metformin. A1c at 7.4%, within target range. Consider Marcelline Deist if A1c increases. - Order A1c and metabolic panel. - Consider Farxiga if A1c elevated. - Refer for diabetic eye exam.  Frequent Urination Chronic frequent urination. Differential includes hyperglycemia, uti or prostatitits. Previous studies normal. - Order PSA and urine culture. - Consider urology referral if labs normal.  General Health Maintenance Due for shingles vaccine, Tdap, diabetic eye exam, and colonoscopy due to history of polyps. - Recommend shingles vaccine and Tdap at pharmacy. - Refer for colonoscopy. - Refer for diabetic eye exam.  On exam ears don't appear to be blocked by wax. Rt side scan wax. If audiology thinks you need lavage let me know but I don't think wax significnat.  Follow up date to be determined after lab review

## 2024-02-14 ENCOUNTER — Encounter: Payer: Self-pay | Admitting: Medical

## 2024-02-14 LAB — URINE CULTURE
MICRO NUMBER:: 16319249
Result:: NO GROWTH
SPECIMEN QUALITY:: ADEQUATE

## 2024-02-17 ENCOUNTER — Other Ambulatory Visit: Payer: Self-pay | Admitting: *Deleted

## 2024-02-17 DIAGNOSIS — I10 Essential (primary) hypertension: Secondary | ICD-10-CM

## 2024-02-17 MED ORDER — SPIRONOLACTONE 25 MG PO TABS: 25.0000 mg | ORAL_TABLET | Freq: Every day | ORAL | 0 refills | Status: DC

## 2024-02-17 NOTE — Addendum Note (Signed)
 Addended by: Serafina Damme on: 02/17/2024 06:24 AM   Modules accepted: Orders

## 2024-02-19 ENCOUNTER — Other Ambulatory Visit: Payer: Self-pay | Admitting: Cardiovascular Disease

## 2024-02-23 ENCOUNTER — Ambulatory Visit: Payer: Self-pay | Attending: Cardiovascular Disease | Admitting: Cardiovascular Disease

## 2024-02-23 VITALS — BP 134/74 | HR 50 | Wt 231.0 lb

## 2024-02-23 DIAGNOSIS — R350 Frequency of micturition: Secondary | ICD-10-CM

## 2024-02-23 DIAGNOSIS — I10 Essential (primary) hypertension: Secondary | ICD-10-CM

## 2024-02-23 DIAGNOSIS — N1831 Chronic kidney disease, stage 3a: Secondary | ICD-10-CM

## 2024-02-23 DIAGNOSIS — I5032 Chronic diastolic (congestive) heart failure: Secondary | ICD-10-CM

## 2024-02-23 DIAGNOSIS — I2510 Atherosclerotic heart disease of native coronary artery without angina pectoris: Secondary | ICD-10-CM | POA: Diagnosis not present

## 2024-02-23 DIAGNOSIS — N401 Enlarged prostate with lower urinary tract symptoms: Secondary | ICD-10-CM

## 2024-02-23 DIAGNOSIS — G4733 Obstructive sleep apnea (adult) (pediatric): Secondary | ICD-10-CM

## 2024-02-23 DIAGNOSIS — E785 Hyperlipidemia, unspecified: Secondary | ICD-10-CM

## 2024-02-23 NOTE — Patient Instructions (Signed)
 Medication Instructions:  No changes *If you need a refill on your cardiac medications before your next appointment, please call your pharmacy*  Follow-Up: At [facility], you and your health needs are our priority.  As part of our continuing mission to provide you with exceptional heart care, our providers are all part of one team.  This team includes your primary Cardiologist (physician) and Advanced Practice Providers or APPs (Physician Assistants and Nurse Practitioners) who all work together to provide you with the care you need, when you need it.  Your next appointment:   1 year(s)  Provider:   Thurmon Fair, MD     We recommend signing up for the patient portal called "MyChart".  Sign up information is provided on this After Visit Summary.  MyChart is used to connect with patients for Virtual Visits (Telemedicine).  Patients are able to view lab/test results, encounter notes, upcoming appointments, etc.  Non-urgent messages can be sent to your provider as well.   To learn more about what you can do with MyChart, go to [email or web address].        1st Floor: - Lobby - Registration  - Pharmacy  - Lab - Cafe  2nd Floor: - PV Lab - Diagnostic Testing (echo, CT, nuclear med)  3rd Floor: - Vacant  4th Floor: - TCTS (cardiothoracic surgery) - AFib Clinic - Structural Heart Clinic - Vascular Surgery  - Vascular Ultrasound  5th Floor: - HeartCare Cardiology (general and EP) - Clinical Pharmacy for coumadin, hypertension, lipid, weight-loss medications, and med management appointments    Valet parking services will be available as well.

## 2024-02-23 NOTE — Progress Notes (Signed)
 Cardiology Office Note:    Date:  02/23/2024   ID:  Raymond [name], DOB 12-20-1950, MRN [id number]  PCP:  Raymond [name]   [facility] Providers Cardiologist:  Luana Rumple, MD     Referring MD: Raymond [name]   No chief complaint on file.   History of Present Illness:    Raymond [name] is a 73 y.o. male with a hx of CAD presenting with acute coronary syndrome (NSTEMI 01/05/2022) and moderate ischemic cardiomyopathy (EF 35-40%) with presence of apical LV thrombus, for which he underwent three-vessel CABG (01/08/2022 LIMA-LAD, SVG-OM, SVG-diagonal).  Additional problems include a remote history of cryptogenic stroke (unrevealing loop recorder for years), type 2 diabetes mellitus, hypertension, hypercholesterolemia, OSA on CPAP.  He is on Entresto  maximum dose, carvedilol  (limited by heart rate), Farxiga , spironolactone  as well as aspirin  and atorvastatin .  He does not need loop diuretics.  He denies angina or dyspnea at rest with activity.  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  Knee problems limit him more than any cardiovascular complaints.  Has frequency and urgency due to benign prostatic hypertrophy.  Has a history of syncope with tamsulosin.  Seeing a urologist next week.  Reports compliance with CPAP and denies daytime hypersomnolence.  Has not had any falls or bleeding problems.  Echo performed on 03/18/2022 shows EF has improved to 65-70%, although he still has an apical area of hypokinesis.  Left ventricular thrombus is no longer seen.  Eliquis  has been stopped.  Renal function is stable with creatinine 1.30-1.38 (GFR 50-55).  Lipid profile in February showed LDL cholesterol of 56, HDL 36. A1c was 7.3%.  Past Medical History:  Diagnosis Date   Arthritis    Depression    Diabetes mellitus type 2 in obese    Hypertension    Migraines    Sleep apnea    Stroke [facility])     Past  Surgical History:  Procedure Laterality Date   CHOLECYSTECTOMY     CORONARY ANGIOGRAPHY N/A 01/07/2022   Procedure: CORONARY ANGIOGRAPHY;  Surgeon: Avanell Leigh, MD;  [facility]: [facility] INVASIVE CV LAB;  Service: Cardiovascular;  Laterality: N/A;   CORONARY ARTERY BYPASS GRAFT N/A 01/08/2022   Procedure: CORONARY ARTERY BYPASS GRAFTING (CABG) TIMES THREE USING LEFT INTERNAL MAMMARY ARTERY AND ENDOSCOPICALLY HARVESTED BILATERAL GREATER SAPHENOUS VEINS;  Surgeon: Hilarie Lovely, MD;  Location: [facility] OR;  Service: Open Heart Surgery;  Laterality: N/A;   ENDOVEIN HARVEST OF GREATER SAPHENOUS VEIN Right 01/08/2022   Procedure: ENDOVEIN HARVEST OF GREATER SAPHENOUS VEIN;  Surgeon: Hilarie Lovely, MD;  Location: [facility] OR;  Service: Open Heart Surgery;  Laterality: Right;   KNEE ARTHROSCOPY     LOOP RECORDER INSERTION N/A 06/24/2017   Procedure: LOOP RECORDER INSERTION;  Surgeon: Jolly Needle, MD;  Location: [facility] INVASIVE CV LAB;  Service: Cardiovascular;  Laterality: N/A;   ROTATOR CUFF REPAIR     TEE WITHOUT CARDIOVERSION N/A 06/24/2017   Procedure: TRANSESOPHAGEAL ECHOCARDIOGRAM (TEE) WITH LOOP;  Surgeon: Alroy Aspen Lela Purple, MD;  Location: [facility] ENDOSCOPY;  Service: Cardiovascular;  Laterality: N/A;   TEE WITHOUT CARDIOVERSION N/A 01/08/2022   Procedure: TRANSESOPHAGEAL ECHOCARDIOGRAM (TEE);  Surgeon: Hilarie Lovely, MD;  [facility] OR;  Service: Open Heart Surgery;  Laterality: N/A;    Current Medications: Current Meds  Medication Sig   aspirin  EC 81 MG EC tablet Take 1 tablet (81 mg total) by mouth daily.  Swallow whole.   atorvastatin  (LIPITOR) 40 MG tablet Take 1 tablet (40 mg total) by mouth daily at 6 PM.   carvedilol  (COREG ) 6.25 MG tablet TAKE 1 TABLET BY MOUTH 2 TIMES A DAY WITH MEALS   clopidogrel  (PLAVIX ) 75 MG tablet Take 1 tablet (75 mg total) by mouth daily. PLEASE KEEP UPCOMING APPOINTMENT IN ORDER TO RECEIVE FUTURE REFILLS. THANK YOU   ENTRESTO  97-103 MG TAKE 1 TABLET BY MOUTH 2  TIMES A DAY   levocetirizine (XYZAL) 5 MG tablet Take 5 mg by mouth daily as needed for allergies.   metFORMIN  (GLUCOPHAGE ) 500 MG tablet TAKE 1 TABLET BY MOUTH 2 TIMES A DAY   sertraline  (ZOLOFT ) 100 MG tablet TAKE 1 TABLET BY MOUTH EVERY DAY   spironolactone  (ALDACTONE ) 25 MG tablet Take 1 tablet (25 mg total) by mouth daily.     Allergies:   Flomax [tamsulosin hcl]  Family History: The patient's family history includes Other in his father and mother.  ROS:   Please see the history of present illness.     All other systems reviewed and are negative.  EKGs/Labs/Other Studies Reviewed:    The following studies were reviewed today: ECHO 03/18/2022   1. LV has improved to 65-70%. The apical septum and apex are mildly  hypokinetic. No LV thrombus on this study. Left ventricular ejection  fraction, by estimation, is 65 to 70%. The left ventricle has normal  function. The left ventricle demonstrates  regional wall motion abnormalities (see scoring diagram/findings for  description). Left ventricular diastolic parameters are consistent with  Grade I diastolic dysfunction (impaired relaxation).   2. Right ventricular systolic function is normal. The right ventricular  size is normal. Tricuspid regurgitation signal is inadequate for assessing  PA pressure.   3. The mitral valve is grossly normal. Trivial mitral valve  regurgitation. No evidence of mitral stenosis.   4. The aortic valve is tricuspid. Aortic valve regurgitation is not  visualized. No aortic stenosis is present.   5. The inferior vena cava is normal in size with greater than 50%  respiratory variability, suggesting right atrial pressure of 3 mmHg.   Comparison(s): Changes from prior study are noted. EF has improved. LV  thrombus is no longer present.   EKG:    EKG Interpretation Date/Time:  Monday February 23 2024 11:04:58 EDT Ventricular Rate:  51 PR Interval:  186 QRS Duration:  92 QT Interval:  438 QTC  Calculation: 403 R Axis:   61  Text Interpretation: Sinus bradycardia Nonspecific T wave abnormality When compared with ECG of 21-Jan-2022 15:14, No significant change was found Confirmed by Alanya Vukelich ([id number]) on 02/23/2024 11:48:59 AM         Recent Labs: 10/24/2023: Hemoglobin 15.9; Platelets 139.0; Pro B Natriuretic peptide (BNP) 91.0 02/13/2024: ALT 8; BUN 17; Creatinine, Ser 1.13; Potassium 4.4; Sodium 139  Recent Lipid Panel    Component Value Date/Time   CHOL 106 02/13/2024 1228   TRIG 143.0 02/13/2024 1228   HDL 35.80 (L) 02/13/2024 1228   CHOLHDL 3 02/13/2024 1228   VLDL 28.6 02/13/2024 1228   LDLCALC 42 02/13/2024 1228   LDLDIRECT 62.0 08/27/2022 0807     Risk Assessment/Calculations:           Physical Exam:    VS:  BP 134/74 (BP Location: Left Arm, Patient Position: Sitting)   Pulse (!) 50   Wt 231 lb (104.8 kg)   SpO2 95%   BMI 39.65 kg/m     Wt  Readings from Last 3 Encounters:  02/23/24 231 lb (104.8 kg)  02/13/24 230 lb (104.3 kg)  10/28/23 236 lb (107 kg)      General: Alert, oriented x3, no distress, severely obese Head: no evidence of trauma, PERRL, EOMI, no exophtalmos or lid lag, no myxedema, no xanthelasma; normal ears, nose and oropharynx Neck: normal jugular venous pulsations and no hepatojugular reflux; brisk carotid pulses without delay and no carotid bruits Chest: clear to auscultation, no signs of consolidation by percussion or palpation, normal fremitus, symmetrical and full respiratory excursions Cardiovascular: normal position and quality of the apical impulse, regular rhythm, normal first and second heart sounds, no murmurs, rubs or gallops Abdomen: no tenderness or distention, no masses by palpation, no abnormal pulsatility or arterial bruits, normal bowel sounds, no hepatosplenomegaly Extremities: no clubbing, cyanosis or edema; 2+ radial, ulnar and brachial pulses bilaterally; 2+ right femoral, posterior tibial and dorsalis  pedis pulses; 2+ left femoral, posterior tibial and dorsalis pedis pulses; no subclavian or femoral bruits Neurological: grossly nonfocal Psych: Normal mood and affect    ASSESSMENT:    1. Chronic diastolic heart failure (HCC)   2. Coronary artery disease involving native coronary artery of native heart without angina pectoris   3. Essential hypertension   4. Benign prostatic hyperplasia with urinary frequency   5. Dyslipidemia (high LDL; low HDL)   6. OSA on CPAP   7. Severe obesity (BMI 35.0-39.9) with comorbidity (HCC)   8. Stage 3a chronic kidney disease (HCC)     PLAN:    In order of problems listed above:  CHF: Currently asymptomatic.  NYHA functional class I and clinically euvolemic without loop diuretics.  On Entresto , carvedilol , Farxiga , spironolactone .  Continue same medications. CAD: Asymptomatic since his bypass surgery.  On aspirin , statin, beta-blocker. HTN: Well-controlled.  There be some room for an alpha-blocker for his prostate, but need to be cautious with orthostatic hypotension.   BPH: Had syncope with tamsulosin.  May be able to tolerate a newer agent such as alfuzosin or silodosin. HLP: Limit LDL (target less than 55).  HDL remains low and unlikely to improve without substantial weight loss. DM: Adequate goal.  Keep on Farxiga  for dual benefit for heart failure and diabetes.  Need additional medications, he is optimally suited for GLP-1 agonist such as Ozempic or Mounjaro. OSA: He reports compliance with CPAP and denies daytime hypersomnolence. Obesity: Knee problems limit physical activity.  Discussed weight to improve his diet, reducing the intake of simple carbohydrates, starches with high glycemic index and saturated fat and eating more vegetable based diet, lean protein, unsaturated fat. CKD stage 3a: Recent creatinine is actually improved at 1.13, which would correspond to a normal GFR of 64 but previous baseline creatinine seems to be more in the 1.2-1.4  range, corresponding to a GFR of around 50-55.        Medication Adjustments/Labs and Tests Ordered: Current medicines are reviewed at length with the patient today.  Concerns regarding medicines are outlined above.  Orders Placed This Encounter  Procedures   EKG 12-Lead   No orders of the defined types were placed in this encounter.   Patient Instructions  Medication Instructions:  No changes *If you need a refill on your cardiac medications before your next appointment, please call your pharmacy*  Follow-Up: At [facility], you and your health needs are our priority.  As part of our continuing mission to provide you with exceptional heart care, our providers are all part of one  team.  This team includes your primary Cardiologist (physician) and Advanced Practice Providers or APPs (Physician Assistants and Nurse Practitioners) who all work together to provide you with the care you need, when you need it.  Your next appointment:   1 year(s)  Provider:   Luana Rumple, MD     We recommend signing up for the patient portal called "MyChart".  Sign up information is provided on this After Visit Summary.  MyChart is used to connect with patients for Virtual Visits (Telemedicine).  Patients are able to view lab/test results, encounter notes, upcoming appointments, etc.  Non-urgent messages can be sent to your provider as well.   To learn more about what you can do with MyChart, go to [email or web address].        1st Floor: - Lobby - Registration  - Pharmacy  - Lab - Cafe  2nd Floor: - PV Lab - Diagnostic Testing (echo, CT, nuclear med)  3rd Floor: - Vacant  4th Floor: - TCTS (cardiothoracic surgery) - AFib Clinic - Structural Heart Clinic - Vascular Surgery  - Vascular Ultrasound  5th Floor: - HeartCare Cardiology (general and EP) - Clinical Pharmacy for coumadin, hypertension, lipid, weight-loss medications, and med management  appointments    Valet parking services will be available as well.      Signed, Luana Rumple, MD  02/23/2024 11:54 AM    [facility]

## 2024-02-25 NOTE — Progress Notes (Addendum)
 Chief Complaint: No chief complaint on file.   History of Present Illness:  Raymond [name] is a 73 y.o. male who is seen in consultation from Saguier, Edward, PA-C for evaluation of LUTS.  He is a retired Engineer, civil (consulting) from [employer].  His biggest issue is urinary leakage.  He also has significant urgency and frequency.  His leakage is mainly from urgency.  He has a good stream.  He has not complained of dysuria or any recent urinary tract infections.  Most recent PSA was about a month ago and was 3.58.  4 years ago, 3.0.  He apparently took Flomax once for lower urinary tract symptoms years ago.  He had an allergic reaction.  He goes through 4-5 pull-ups a day.  Most of his urine comes out in his pull-up.   Past Medical History:  Past Medical History:  Diagnosis Date   Arthritis    Depression    Diabetes mellitus type 2 in obese    Hypertension    Migraines    Sleep apnea    Stroke [facility])     Past Surgical History:  Past Surgical History:  Procedure Laterality Date   CHOLECYSTECTOMY     CORONARY ANGIOGRAPHY N/A 01/07/2022   Procedure: CORONARY ANGIOGRAPHY;  Surgeon: Avanell Leigh, MD;  [facility]: [facility] INVASIVE CV LAB;  Service: Cardiovascular;  Laterality: N/A;   CORONARY ARTERY BYPASS GRAFT N/A 01/08/2022   Procedure: CORONARY ARTERY BYPASS GRAFTING (CABG) TIMES THREE USING LEFT INTERNAL MAMMARY ARTERY AND ENDOSCOPICALLY HARVESTED BILATERAL GREATER SAPHENOUS VEINS;  Surgeon: Hilarie Lovely, MD;  Location: [facility] OR;  Service: Open Heart Surgery;  Laterality: N/A;   ENDOVEIN HARVEST OF GREATER SAPHENOUS VEIN Right 01/08/2022   Procedure: ENDOVEIN HARVEST OF GREATER SAPHENOUS VEIN;  Surgeon: Hilarie Lovely, MD;  Location: [facility] OR;  Service: Open Heart Surgery;  Laterality: Right;   KNEE ARTHROSCOPY     LOOP RECORDER INSERTION N/A 06/24/2017   Procedure: LOOP RECORDER INSERTION;  Surgeon: Jolly Needle, MD;  Location: [facility] INVASIVE CV LAB;  Service: Cardiovascular;   Laterality: N/A;   ROTATOR CUFF REPAIR     TEE WITHOUT CARDIOVERSION N/A 06/24/2017   Procedure: TRANSESOPHAGEAL ECHOCARDIOGRAM (TEE) WITH LOOP;  Surgeon: Alroy Aspen Lela Purple, MD;  Location: [facility] ENDOSCOPY;  Service: Cardiovascular;  Laterality: N/A;   TEE WITHOUT CARDIOVERSION N/A 01/08/2022   Procedure: TRANSESOPHAGEAL ECHOCARDIOGRAM (TEE);  Surgeon: Hilarie Lovely, MD;  Location: [facility] OR;  Service: Open Heart Surgery;  Laterality: N/A;    Allergies:  Allergies  Allergen Reactions   Flomax [Tamsulosin Hcl] Other (See Comments)    HYPOtension and syncope    Family History:  Family History  Problem Relation Age of Onset   Other Mother        reports in good health   Other Father        was estranged from family, unknown medical history    Social History:  Social History   Tobacco Use   Smoking status: Former    Types: Cigars    Quit date: 01/17/2022    Years since quitting: 2.1   Smokeless tobacco: Never   Tobacco comments:    chew on cigars its not lit  Substance Use Topics   Alcohol use: No    Alcohol/week: 0.0 standard drinks of alcohol   Drug use: No    Review of symptoms:  Constitutional:  Negative for unexplained weight loss, night sweats, fever, chills ENT:  Negative for nose bleeds, sinus pain,  painful swallowing CV:  Negative for chest pain, shortness of breath, exercise intolerance, palpitations, loss of consciousness Resp:  Negative for cough, wheezing, shortness of breath GI:  Negative for nausea, vomiting, diarrhea, bloody stools GU:  Positives noted in HPI; Neuro:  Negative for seizures, poor balance, limb weakness, slurred speech Psych:  Negative for lack of energy, depression, anxiety Endocrine:  Negative for polydipsia, polyuria, symptoms of hypoglycemia (dizziness, hunger, sweating) Hematologic:  Negative for anemia, purpura, petechia, prolonged or excessive bleeding, use of anticoagulants  Allergic:  Negative for difficulty breathing or choking as a  result of exposure to anything; no shellfish allergy; no allergic response (rash/itch) to materials, foods  Physical exam: There were no vitals taken for this visit. GENERAL APPEARANCE:  Well appearing, well developed, well nourished, NAD HEENT: Atraumatic, Normocephalic. NECK: Normal appearance LUNGS: Normal inspiratory and expiratory excursion HEART: Regular Rate ABDOMEN: Obese, no inguinal hernias GU: Phallus normal, no lesions. Scrotal skin normal. Testicles/epididymal structures normal. Meatus normal. Normal anal sphincter tone, prostate 50 mL, symmetric, non nodular, non tender. EXTREMITIES: Moves all extremities well.  Without clubbing, cyanosis, or edema. NEUROLOGIC:  Alert and oriented x 3, normal gait, CN II-XII grossly intact.  MENTAL STATUS:  Appropriate. SKIN:  Warm, dry and intact.    Results:  I have reviewed referring/prior physicians notes  I have reviewed urinalysis--clear  IPSS score sheet reviewed--21/4  Bladder scan results reviewed-30 mL  I have reviewed PSA results  I have reviewed prior imaging--renal ultrasound results from 2017 reviewed.  Normal kidneys, 7 mm right renal cyst.  I have reviewed urine culture results  Assessment: Overactive bladder symptoms with significant urgency incontinence   Plan: -OAB guide sheet given  -I also gave him 28 Gemtesa tablets to take-1 every other day  -I will have him come back in about 2 months to recheck symptoms

## 2024-03-08 ENCOUNTER — Ambulatory Visit: Admitting: Urology

## 2024-03-08 ENCOUNTER — Encounter: Payer: Self-pay | Admitting: Urology

## 2024-03-08 VITALS — BP 169/83 | HR 57 | Ht 65.0 in | Wt 230.0 lb

## 2024-03-08 DIAGNOSIS — R3915 Urgency of urination: Secondary | ICD-10-CM

## 2024-03-08 DIAGNOSIS — R32 Unspecified urinary incontinence: Secondary | ICD-10-CM | POA: Diagnosis not present

## 2024-03-08 DIAGNOSIS — R350 Frequency of micturition: Secondary | ICD-10-CM

## 2024-03-08 LAB — URINALYSIS, ROUTINE W REFLEX MICROSCOPIC
Bilirubin, UA: NEGATIVE
Leukocytes,UA: NEGATIVE
Nitrite, UA: NEGATIVE
Protein,UA: NEGATIVE
RBC, UA: NEGATIVE
Specific Gravity, UA: 1.015 (ref 1.005–1.030)
Urobilinogen, Ur: 1 mg/dL (ref 0.2–1.0)
pH, UA: 7 (ref 5.0–7.5)

## 2024-03-08 LAB — MICROSCOPIC EXAMINATION: Bacteria, UA: NONE SEEN

## 2024-03-08 LAB — BLADDER SCAN AMB NON-IMAGING: Scan Result: 20

## 2024-03-29 ENCOUNTER — Other Ambulatory Visit: Payer: Self-pay | Admitting: Cardiovascular Disease

## 2024-04-02 ENCOUNTER — Encounter: Payer: Self-pay | Admitting: Medical

## 2024-04-21 ENCOUNTER — Other Ambulatory Visit: Payer: Self-pay | Admitting: Cardiovascular Disease

## 2024-04-21 DIAGNOSIS — I10 Essential (primary) hypertension: Secondary | ICD-10-CM

## 2024-05-10 NOTE — Progress Notes (Signed)
 History of Present Illness:  5.5.2025: Initial visit here in [facility] for [place]. He is a retired Engineer, civil (consulting) from [employer].  His biggest issue is urinary leakage.  He also has significant urgency and frequency.  His leakage is mainly from urgency.  He has a good stream.  He has not complained of dysuria or any recent urinary tract infections.  Most recent PSA was about a month ago and was 3.58.  4 years ago, 3.0. He apparently took Flomax once for lower urinary tract symptoms years ago.  He had an allergic reaction. He goes through 4-5 pull-ups a day.  Most of his urine comes out in his pull-up. As I felt that he had primarily OAB issues, he was given samples of Gemtesa.  7.9.2025:  Here for recheck.  He tolerated the Gemtesa well.  It significantly improved his symptomatology.  He would like to continue medical therapy.  Past Medical History:  Past Medical History:  Diagnosis Date   Arthritis    Depression    Diabetes mellitus type 2 in obese    Hypertension    Migraines    Sleep apnea    Stroke [facility])     Past Surgical History:  Past Surgical History:  Procedure Laterality Date   CHOLECYSTECTOMY     CORONARY ANGIOGRAPHY N/A 01/07/2022   Procedure: CORONARY ANGIOGRAPHY;  Surgeon: Court Dorn PARAS, MD;  [facility];  Service: Cardiovascular;  Laterality: N/A;   CORONARY ARTERY BYPASS GRAFT N/A 01/08/2022   Procedure: CORONARY ARTERY BYPASS GRAFTING (CABG) TIMES THREE USING LEFT INTERNAL MAMMARY ARTERY AND ENDOSCOPICALLY HARVESTED BILATERAL GREATER SAPHENOUS VEINS;  Surgeon: Shyrl Linnie KIDD, MD;  Location: [facility] OR;  Service: Open Heart Surgery;  Laterality: N/A;   ENDOVEIN HARVEST OF GREATER SAPHENOUS VEIN Right 01/08/2022   Procedure: ENDOVEIN HARVEST OF GREATER SAPHENOUS VEIN;  Surgeon: Shyrl Linnie KIDD, MD;  Location: [facility] OR;  Service: Open Heart Surgery;  Laterality: Right;   KNEE ARTHROSCOPY     LOOP RECORDER INSERTION N/A 06/24/2017   Procedure:  LOOP RECORDER INSERTION;  Surgeon: Kelsie Agent, MD;  [facility];  Service: Cardiovascular;  Laterality: N/A;   ROTATOR CUFF REPAIR     TEE WITHOUT CARDIOVERSION N/A 06/24/2017   Procedure: TRANSESOPHAGEAL ECHOCARDIOGRAM (TEE) WITH LOOP;  Surgeon: Alveta Aleene PARAS, MD;  Location: [facility] ENDOSCOPY;  Service: Cardiovascular;  Laterality: N/A;   TEE WITHOUT CARDIOVERSION N/A 01/08/2022   Procedure: TRANSESOPHAGEAL ECHOCARDIOGRAM (TEE);  Surgeon: Shyrl Linnie KIDD, MD;  Location: [facility] OR;  Service: Open Heart Surgery;  Laterality: N/A;    Allergies:  Allergies  Allergen Reactions   Flomax [Tamsulosin Hcl] Other (See Comments)    HYPOtension and syncope    Family History:  Family History  Problem Relation Age of Onset   Other Mother        reports in good health   Other Father        was estranged from family, unknown medical history    Social History:  Social History   Tobacco Use   Smoking status: Former    Types: Cigars    Quit date: 01/17/2022    Years since quitting: 2.3   Smokeless tobacco: Never   Tobacco comments:    chew on cigars its not lit  Substance Use Topics   Alcohol use: No    Alcohol/week: 0.0 standard drinks of alcohol   Drug use: No     Results:  I have reviewed prior  notes  I have reviewed urinalysis--clear  Bladder scan results reviewed- 15 mL  I have reviewed PSA results   Assessment: Overactive bladder symptoms with significant urgency incontinence, improved with overactive bladder medication   Plan: I sent in a prescription for solifenacin , side effects discussed  I will see back in a year

## 2024-05-12 ENCOUNTER — Encounter: Payer: Self-pay | Admitting: Urology

## 2024-05-12 ENCOUNTER — Ambulatory Visit: Admitting: Urology

## 2024-05-12 VITALS — BP 108/69 | HR 60 | Ht 65.0 in | Wt 230.0 lb

## 2024-05-12 DIAGNOSIS — R350 Frequency of micturition: Secondary | ICD-10-CM | POA: Diagnosis not present

## 2024-05-12 DIAGNOSIS — N3941 Urge incontinence: Secondary | ICD-10-CM

## 2024-05-12 DIAGNOSIS — R32 Unspecified urinary incontinence: Secondary | ICD-10-CM

## 2024-05-12 DIAGNOSIS — R3915 Urgency of urination: Secondary | ICD-10-CM

## 2024-05-12 LAB — URINALYSIS, ROUTINE W REFLEX MICROSCOPIC
Bilirubin, UA: NEGATIVE
Ketones, UA: NEGATIVE
Leukocytes,UA: NEGATIVE
Nitrite, UA: NEGATIVE
Protein,UA: NEGATIVE
RBC, UA: NEGATIVE
Specific Gravity, UA: >1.03 — ABNORMAL HIGH (ref 1.005–1.030)
Urobilinogen, Ur: 0.2 mg/dL (ref 0.2–1.0)
pH, UA: 5.5 (ref 5.0–7.5)

## 2024-05-12 LAB — MICROSCOPIC EXAMINATION: RBC, Urine: NONE SEEN /HPF (ref 0–2)

## 2024-05-12 LAB — BLADDER SCAN AMB NON-IMAGING: Scan Result: 15

## 2024-05-12 MED ORDER — SOLIFENACIN SUCCINATE 10 MG PO TABS: 10.0000 mg | ORAL_TABLET | Freq: Every day | ORAL | 3 refills | Status: AC

## 2024-06-08 ENCOUNTER — Ambulatory Visit (INDEPENDENT_AMBULATORY_CARE_PROVIDER_SITE_OTHER)

## 2024-06-08 VITALS — Ht 65.0 in | Wt 230.0 lb

## 2024-06-08 DIAGNOSIS — Z0000 Encounter for general adult medical examination without abnormal findings: Secondary | ICD-10-CM

## 2024-06-08 DIAGNOSIS — Z1211 Encounter for screening for malignant neoplasm of colon: Secondary | ICD-10-CM

## 2024-06-08 NOTE — Progress Notes (Signed)
 Subjective:   Raymond [name] is a 73 y.o. who presents for a Medicare Wellness preventive visit.  As a reminder, Annual Wellness Visits don't include a physical exam, and some assessments may be limited, especially if this visit is performed virtually. We may recommend an in-person follow-up visit with your provider if needed.  Visit Complete: Virtual I connected with  Raymond [name] on 06/08/24 by a audio enabled telemedicine application and verified that I am speaking with the correct person using two identifiers.  Patient Location: Home  Provider Location: Home Office  I discussed the limitations of evaluation and management by telemedicine. The patient expressed understanding and agreed to proceed.  Vital Signs: Because this visit was a virtual/telehealth visit, some criteria may be missing or patient reported. Any vitals not documented were not able to be obtained and vitals that have been documented are patient reported.    Persons Participating in Visit: Patient assisted by Wife.  AWV Questionnaire: No: Patient Medicare AWV questionnaire was not completed prior to this visit.  Cardiac Risk Factors include: advanced age (>51men, >51 women);male gender;diabetes mellitus;hypertension     Objective:    Today's Vitals   06/08/24 1247  Weight: 230 lb (104.3 kg)  Height: 5' 5 (1.651 m)   Body mass index is 38.27 kg/m.     06/08/2024   12:54 PM 08/05/2022   12:32 PM 01/08/2022   10:26 AM 01/05/2022   10:40 PM 07/10/2017    8:55 PM 07/10/2017   12:43 PM 06/21/2017   11:00 PM  Advanced Directives  Does Patient Have a Medical Advance Directive? Yes Yes Yes Yes No  No  No   Type of Estate agent of [place];Living will Healthcare Power of [place];Living will Healthcare Power of [place];Living will Healthcare Power of [place];Living will     Does patient want to make changes to medical advance directive?   No - Patient declined Yes (ED - Information included in  AVS)     Copy of Healthcare Power of Attorney in Chart? No - copy requested  No - copy requested No - copy requested     Would patient like information on creating a medical advance directive?     No - Patient declined   No - Patient declined      Data saved with a previous flowsheet row definition    Current Medications (verified) Outpatient Encounter Medications as of 06/08/2024  Medication Sig   albuterol  (VENTOLIN  HFA) 108 (90 Base) MCG/ACT inhaler Inhale 2 puffs into the lungs every 6 (six) hours as needed.   aspirin  EC 81 MG EC tablet Take 1 tablet (81 mg total) by mouth daily. Swallow whole.   atorvastatin  (LIPITOR) 40 MG tablet Take 1 tablet (40 mg total) by mouth daily at 6 PM.   benzonatate  (TESSALON ) 100 MG capsule Take 1 capsule (100 mg total) by mouth 3 (three) times daily as needed for cough.   budesonide -formoterol  (SYMBICORT ) 160-4.5 MCG/ACT inhaler Inhale 2 puffs into the lungs 2 (two) times daily.   carvedilol  (COREG ) 6.25 MG tablet TAKE 1 TABLET BY MOUTH 2 TIMES A DAY WITH MEALS   clopidogrel  (PLAVIX ) 75 MG tablet TAKE 1 TABLET BY MOUTH EVERY DAY   dapagliflozin  propanediol (FARXIGA ) 10 MG TABS tablet Take 1 tablet (10 mg total) by mouth daily before breakfast. (Patient not taking: Reported on 05/12/2024)   ENTRESTO  97-103 MG TAKE 1 TABLET BY MOUTH 2 TIMES A DAY   fluticasone  (FLONASE ) 50 MCG/ACT nasal spray  Place 2 sprays into both nostrils daily.   levocetirizine (XYZAL) 5 MG tablet Take 5 mg by mouth daily as needed for allergies.   metFORMIN  (GLUCOPHAGE ) 500 MG tablet TAKE 1 TABLET BY MOUTH 2 TIMES A DAY   methylPREDNISolone  (MEDROL ) 4 MG tablet Standard 6 day taper.   sertraline  (ZOLOFT ) 100 MG tablet TAKE 1 TABLET BY MOUTH EVERY DAY   solifenacin  (VESICARE ) 10 MG tablet Take 1 tablet (10 mg total) by mouth daily.   spironolactone  (ALDACTONE ) 25 MG tablet TAKE 1 TABLET BY MOUTH EACH DAY   No facility-administered encounter medications on file as of 06/08/2024.     Allergies (verified) Flomax [tamsulosin hcl]   History: Past Medical History:  Diagnosis Date   Arthritis    Depression    Diabetes mellitus type 2 in obese    Hypertension    Migraines    Sleep apnea    Stroke [facility])    Past Surgical History:  Procedure Laterality Date   CHOLECYSTECTOMY     CORONARY ANGIOGRAPHY N/A 01/07/2022   Procedure: CORONARY ANGIOGRAPHY;  Surgeon: Court Dorn PARAS, MD;  [facility];  Service: Cardiovascular;  Laterality: N/A;   CORONARY ARTERY BYPASS GRAFT N/A 01/08/2022   Procedure: CORONARY ARTERY BYPASS GRAFTING (CABG) TIMES THREE USING LEFT INTERNAL MAMMARY ARTERY AND ENDOSCOPICALLY HARVESTED BILATERAL GREATER SAPHENOUS VEINS;  Surgeon: Shyrl Linnie KIDD, MD;  [facility] OR;  Service: Open Heart Surgery;  Laterality: N/A;   ENDOVEIN HARVEST OF GREATER SAPHENOUS VEIN Right 01/08/2022   Procedure: ENDOVEIN HARVEST OF GREATER SAPHENOUS VEIN;  Surgeon: Shyrl Linnie KIDD, MD;  [facility];  Service: Open Heart Surgery;  Laterality: Right;   KNEE ARTHROSCOPY     LOOP RECORDER INSERTION N/A 06/24/2017   Procedure: LOOP RECORDER INSERTION;  Surgeon: Kelsie Agent, MD;  [facility];  Service: Cardiovascular;  Laterality: N/A;   ROTATOR CUFF REPAIR     TEE WITHOUT CARDIOVERSION N/A 06/24/2017   Procedure: TRANSESOPHAGEAL ECHOCARDIOGRAM (TEE) WITH LOOP;  Surgeon: Alveta Aleene PARAS, MD;  Location: [facility] ENDOSCOPY;  Service: Cardiovascular;  Laterality: N/A;   TEE WITHOUT CARDIOVERSION N/A 01/08/2022   Procedure: TRANSESOPHAGEAL ECHOCARDIOGRAM (TEE);  Surgeon: Shyrl Linnie KIDD, MD;  Location: [facility] OR;  Service: Open Heart Surgery;  Laterality: N/A;   Family History  Problem Relation Age of Onset   Other Mother        reports in good health   Other Father        was estranged from family, unknown medical history   Social History   Socioeconomic History   Marital status: Married    Spouse name: Raymond [name]   Number of  children: Not on file   Years of education: Not on file   Highest education level: Associate degree: academic program  Occupational History   Occupation: retired    Comment: Charity fundraiser  Tobacco Use   Smoking status: Former    Types: Cigars    Quit date: 01/17/2022    Years since quitting: 2.3   Smokeless tobacco: Never   Tobacco comments:    chew on cigars its not lit  Substance and Sexual Activity   Alcohol use: No    Alcohol/week: 0.0 standard drinks of alcohol   Drug use: No   Sexual activity: Not Currently  Other Topics Concern   Not on file  Social History Narrative   Not on file   Social Drivers of Health   Financial Resource Strain: Low Risk  (06/08/2024)  Overall Financial Resource Strain (CARDIA)    Difficulty of Paying Living Expenses: Not hard at all  Food Insecurity: No Food Insecurity (06/08/2024)   Hunger Vital Sign    Worried About Running Out of Food in the Last Year: Never true    Ran Out of Food in the Last Year: Never true  Transportation Needs: No Transportation Needs (06/08/2024)   PRAPARE - Administrator, Civil Service (Medical): No    Lack of Transportation (Non-Medical): No  Physical Activity: Inactive (06/08/2024)   Exercise Vital Sign    Days of Exercise per Week: 0 days    Minutes of Exercise per Session: 0 min  Stress: No Stress Concern Present (06/08/2024)   [employer] of Occupational Health - Occupational Stress Questionnaire    Feeling of Stress: Not at all  Social Connections: Moderately Isolated (06/08/2024)   Social Connection and Isolation Panel    Frequency of Communication with Friends and Family: More than three times a week    Frequency of Social Gatherings with Friends and Family: More than three times a week    Attends Religious Services: Never    Database administrator or Organizations: No    Attends Engineer, structural: Never    Marital Status: Married    Tobacco Counseling Counseling given: Not  Answered Tobacco comments: chew on cigars its not lit    Clinical Intake:  Pre-visit preparation completed: Yes  Pain : No/denies pain     BMI - recorded: 38.27 Nutritional Status: BMI > 30  Obese Nutritional Risks: None Diabetes: Yes CBG done?: No Did pt. bring in CBG monitor from home?: No  Lab Results  Component Value Date   HGBA1C 6.0 02/13/2024   HGBA1C 7.4 (H) 10/24/2023   HGBA1C 7.2 (H) 07/11/2023     How often do you need to have someone help you when you read instructions, pamphlets, or other written materials from your doctor or pharmacy?: 1 - Never  Interpreter Needed?: No  Information entered by :: Rojelio Blush LPN   Activities of Daily Living     06/08/2024   12:52 PM  In your present state of health, do you have any difficulty performing the following activities:  Hearing? 1  Comment Wears Hearing Aids  Vision? 0  Difficulty concentrating or making decisions? 0  Walking or climbing stairs? 0  Dressing or bathing? 0  Doing errands, shopping? 0  Preparing Food and eating ? N  Using the Toilet? N  In the past six months, have you accidently leaked urine? Y  Comment Wears Breifs Followed by Urologist  Do you have problems with loss of bowel control? N  Managing your Medications? N  Managing your Finances? N  Housekeeping or managing your Housekeeping? N    Patient Care Team: Saguier, Edward, PA-C as PCP - General (Internal Medicine) Francyne Headland, MD as PCP - Cardiology (Cardiology)  I have updated your Care Teams any recent Medical Services you may have received from other providers in the past year.     Assessment:   This is a routine wellness examination for [place].  Hearing/Vision screen Hearing Screening - Comments:: Wears Hearing Aids Vision Screening - Comments:: Wears rx glasses - up to date with routine eye exams with  [facility]   Goals Addressed               This Visit's Progress     Increase physical activity  (pt-stated)  Lose a little weight       Depression Screen     06/08/2024   12:51 PM 06/17/2023    2:06 PM 08/05/2022   12:35 PM 05/21/2022   11:02 AM  PHQ 2/9 Scores  PHQ - 2 Score 0 0 0 0    Fall Risk     06/08/2024   12:53 PM 06/17/2023    2:06 PM 03/05/2023    8:05 AM 08/05/2022   12:34 PM 05/21/2022   11:02 AM  Fall Risk   Falls in the past year? 0 0 0 0 0  Number falls in past yr: 0 0 0 0 0  Injury with Fall? 0 0 0 0 0  Risk for fall due to : No Fall Risks  No Fall Risks    Follow up Falls evaluation completed Falls evaluation completed Falls evaluation completed;Education provided Falls prevention discussed  Falls evaluation completed      Data saved with a previous flowsheet row definition    MEDICARE RISK AT HOME:  Medicare Risk at Home Any stairs in or around the home?: Yes If so, are there any without handrails?: No Home free of loose throw rugs in walkways, pet beds, electrical cords, etc?: Yes Adequate lighting in your home to reduce risk of falls?: Yes Life alert?: No Use of a cane, walker or w/c?: No Grab bars in the bathroom?: Yes Shower chair or bench in shower?: Yes Elevated toilet seat or a handicapped toilet?: No  TIMED UP AND GO:  Was the test performed?  No  Cognitive Function: 6CIT completed        06/08/2024   12:54 PM 08/05/2022   12:41 PM  6CIT Screen  What Year? 0 points 0 points  What month? 0 points 0 points  What time? 0 points 0 points  Count back from 20 0 points 0 points  Months in reverse 0 points 0 points  Repeat phrase 0 points 0 points  Total Score 0 points 0 points    Immunizations Immunization History  Administered Date(s) Administered   Fluad Quad(high Dose 65+) 08/13/2022   Fluad Trivalent(High Dose 65+) 07/11/2023   PNEUMOCOCCAL CONJUGATE-20 12/05/2022    Screening Tests Health Maintenance  Topic Date Due   FOOT EXAM  Never done   OPHTHALMOLOGY EXAM  Never done   Hepatitis C Screening  Never done    DTaP/Tdap/Td (1 - Tdap) Never done   Colonoscopy  Never done   Zoster Vaccines- Shingrix (1 of 2) Never done   COVID-19 Vaccine (1 - 2024-25 season) Never done   INFLUENZA VACCINE  06/04/2024   HEMOGLOBIN A1C  08/14/2024   Diabetic kidney evaluation - eGFR measurement  02/12/2025   Diabetic kidney evaluation - Urine ACR  02/12/2025   Medicare Annual Wellness (AWV)  06/08/2025   Pneumococcal Vaccine: 50+ Years  Completed   Hepatitis B Vaccines  Aged Out   HPV VACCINES  Aged Out   Meningococcal B Vaccine  Aged Out    Health Maintenance  Health Maintenance Due  Topic Date Due   FOOT EXAM  Never done   OPHTHALMOLOGY EXAM  Never done   Hepatitis C Screening  Never done   DTaP/Tdap/Td (1 - Tdap) Never done   Colonoscopy  Never done   Zoster Vaccines- Shingrix (1 of 2) Never done   COVID-19 Vaccine (1 - 2024-25 season) Never done   INFLUENZA VACCINE  06/04/2024   Health Maintenance Items Addressed: Referral sent to GI for colonoscopy  Additional Screening:  Vision Screening: Recommended annual ophthalmology exams for early detection of glaucoma and other disorders of the eye. Would you like a referral to an eye doctor? No    Dental Screening: Recommended annual dental exams for proper oral hygiene  Community Resource Referral / Chronic Care Management: CRR required this visit?  No   CCM required this visit?  No   Plan:    I have personally reviewed and noted the following in the patient's chart:   Medical and social history Use of alcohol, tobacco or illicit drugs  Current medications and supplements including opioid prescriptions. Patient is not currently taking opioid prescriptions. Functional ability and status Nutritional status Physical activity Advanced directives List of other physicians Hospitalizations, surgeries, and ER visits in previous 12 months Vitals Screenings to include cognitive, depression, and falls Referrals and appointments  In addition, I  have reviewed and discussed with patient certain preventive protocols, quality metrics, and best practice recommendations. A written personalized care plan for preventive services as well as general preventive health recommendations were provided to patient.   Rojelio LELON Blush, LPN   11/07/7972   After Visit Summary: (MyChart) Due to this being a telephonic visit, the after visit summary with patients personalized plan was offered to patient via MyChart   Notes: Nothing significant to report at this time.

## 2024-06-08 NOTE — Patient Instructions (Addendum)
 Raymond [name] , Thank you for taking time out of your busy schedule to complete your Annual Wellness Visit with me. I enjoyed our conversation and look forward to speaking with you again next year. I, as well as your care team,  appreciate your ongoing commitment to your health goals. Please review the following plan we discussed and let me know if I can assist you in the future. Your Game plan/ To Do List    Referrals: If you haven't heard from the office you've been referred to, please reach out to them at the phone provided.   Follow up Visits: We will see or speak with you next year for your Next Medicare AWV with our clinical staff 06/14/25 @ 1:50p Have you seen your provider in the last 6 months (3 months if uncontrolled diabetes)?   Clinician Recommendations:  Aim for 30 minutes of exercise or brisk walking, 6-8 glasses of water, and 5 servings of fruits and vegetables each day.       This is a list of the screenings recommended for you:  Health Maintenance  Topic Date Due   Complete foot exam   Never done   Eye exam for diabetics  Never done   Hepatitis C Screening  Never done   DTaP/Tdap/Td vaccine (1 - Tdap) Never done   Colon Cancer Screening  Never done   Zoster (Shingles) Vaccine (1 of 2) Never done   COVID-19 Vaccine (1 - 2024-25 season) Never done   Flu Shot  06/04/2024   Hemoglobin A1C  08/14/2024   Yearly kidney function blood test for diabetes  02/12/2025   Yearly kidney health urinalysis for diabetes  02/12/2025   Medicare Annual Wellness Visit  06/08/2025   Pneumococcal Vaccine for age over 29  Completed   Hepatitis B Vaccine  Aged Out   HPV Vaccine  Aged Out   Meningitis B Vaccine  Aged Out    Advanced directives: (Copy Requested) Please bring a copy of your health care power of attorney and living will to the office to be added to your chart at your convenience. You can mail to [facility] [address] [place], [place] [address] or email to  [email or web address] Advance Care Planning is important because it:  [x]  Makes sure you receive the medical care that is consistent with your values, goals, and preferences  [x]  It provides guidance to your family and loved ones and reduces their decisional burden about whether or not they are making the right decisions based on your wishes.  Follow the link provided in your after visit summary or read over the paperwork we have mailed to you to help you started getting your Advance Directives in place. If you need assistance in completing these, please reach out to us  so that we can help you!  See attachments for Preventive Care and Fall Prevention Tips.

## 2024-06-21 ENCOUNTER — Other Ambulatory Visit: Payer: Self-pay | Admitting: Medical

## 2024-06-23 ENCOUNTER — Ambulatory Visit
Admission: RE | Admit: 2024-06-23 | Discharge: 2024-06-23 | Disposition: A | Discharge status: Home / Self Care | Source: Ambulatory Visit | Attending: Medical | Admitting: Medical

## 2024-06-23 ENCOUNTER — Ambulatory Visit: Payer: Self-pay | Admitting: Medical

## 2024-06-23 DIAGNOSIS — Z1211 Encounter for screening for malignant neoplasm of colon: Secondary | ICD-10-CM

## 2024-06-24 ENCOUNTER — Other Ambulatory Visit (HOSPITAL_BASED_OUTPATIENT_CLINIC_OR_DEPARTMENT_OTHER): Payer: Self-pay

## 2024-06-24 NOTE — Progress Notes (Signed)
 Seen by patient Raymond [name] on 06/24/2024 11:11 AM via mychart

## 2024-06-28 ENCOUNTER — Encounter: Payer: Self-pay | Admitting: *Deleted

## 2024-06-29 ENCOUNTER — Other Ambulatory Visit (HOSPITAL_BASED_OUTPATIENT_CLINIC_OR_DEPARTMENT_OTHER): Payer: Self-pay

## 2024-06-29 ENCOUNTER — Telehealth (INDEPENDENT_AMBULATORY_CARE_PROVIDER_SITE_OTHER): Payer: Medicare HMO | Admitting: Adult Health

## 2024-06-29 DIAGNOSIS — G4733 Obstructive sleep apnea (adult) (pediatric): Secondary | ICD-10-CM | POA: Diagnosis not present

## 2024-06-29 NOTE — Patient Instructions (Signed)
 Continue using CPAP nightly and greater than 4 hours each night If your symptoms worsen or you develop new symptoms please let us  know.

## 2024-06-29 NOTE — Progress Notes (Signed)
 PATIENT: Raymond [name] DOB: 1951/01/25  REASON FOR VISIT: follow up HISTORY FROM: patient   Virtual Visit via Video Note  I connected with [name] on 06/29/24 at  3:15 PM EDT by a video enabled telemedicine application located remotely at [facility] and verified that I am speaking with the correct person using two identifiers who was located at their own home in [place]   I discussed the limitations of evaluation and management by telemedicine and the availability of in person appointments. The patient expressed understanding and agreed to proceed.   PATIENT: Raymond [name] DOB: Jun 25, 1951  REASON FOR VISIT: follow up HISTORY FROM: patient  HISTORY OF PRESENT ILLNESS: Today 06/29/24:  Raymond [name] is a 73 y.o. male with a history of obstructive sleep apnea on CPAP. Returns today for follow-up.  He reports that CPAP is working well for him.  He denies any new issues.  Changes at his supplies regularly.  Wears a fullface mask.  Download is below.     07/03/23: [name] is a 73 y.o. male with a history of obstructive sleep apnea on CPAP. Returns today for follow-up.  Reports that CPAP is working well for him.  Denies any new issues.  Download is below     06/25/22: [name] is a 73 year old male with a history of obstructive sleep apnea on CPAP.  He reports that his CPAP continues to work well for him.  He continues to notice the benefit.  His download is below   07/03/21: [name] is a 73 year old male with a history of obstructive sleep apnea on CPAP.  He returns today for follow-up.  He reports that the CPAP is working well.  He denies any new issues.  He returns today for an evaluation.    06/27/20: [name] is a 73 year old male with a history of obstructive sleep apnea on CPAP.  His download indicates that he uses machine nightly for compliance of 100%.  He uses machine greater than 4 hours 29 out of 30 days for compliance of 97%.  On  average he uses his machine 10 hours and 11 minutes.  His residual AHI is 0.9 on 9 cm of water with EPR of 1.  Leak in the 95th percentile is 0.  HISTORY 06/24/19:   [name] is a 73 year old male with a history of obstructive sleep apnea on CPAP.  He returns today for follow-up.  His download indicates that he uses machine nightly for compliance of 100%.  He uses machine greater than 4 hours 29 days for compliance of 97%.  On average he uses his machine 9 hours and 50 minutes.  His residual AHI is 0.8 on 9 cm of water with EPR of 1.  His leak in the 95th percentile is 2.  Overall he reports that he is doing well.  He denies any new issues.  He returns today for evaluation.    REVIEW OF SYSTEMS: Out of a complete 14 system review of symptoms, the patient complains only of the following symptoms, and all other reviewed systems are negative.  ALLERGIES: Allergies  Allergen Reactions   Flomax [Tamsulosin Hcl] Other (See Comments)    HYPOtension and syncope    HOME MEDICATIONS: Outpatient Medications Prior to Visit  Medication Sig Dispense Refill   albuterol  (VENTOLIN  HFA) 108 (90 Base) MCG/ACT inhaler Inhale 2 puffs into the lungs every 6 (six) hours as needed. 18 g 0   aspirin  EC  81 MG EC tablet Take 1 tablet (81 mg total) by mouth daily. Swallow whole. 30 tablet 11   atorvastatin  (LIPITOR) 40 MG tablet Take 1 tablet (40 mg total) by mouth daily at 6 PM. 90 tablet 0   benzonatate  (TESSALON ) 100 MG capsule Take 1 capsule (100 mg total) by mouth 3 (three) times daily as needed for cough. 30 capsule 0   budesonide -formoterol  (SYMBICORT ) 160-4.5 MCG/ACT inhaler Inhale 2 puffs into the lungs 2 (two) times daily. 1 each 12   carvedilol  (COREG ) 6.25 MG tablet TAKE 1 TABLET BY MOUTH 2 TIMES A DAY WITH MEALS 90 tablet 3   clopidogrel  (PLAVIX ) 75 MG tablet TAKE 1 TABLET BY MOUTH EVERY DAY 90 tablet 3   dapagliflozin  propanediol (FARXIGA ) 10 MG TABS tablet Take 1 tablet (10 mg total) by mouth daily  before breakfast. (Patient not taking: Reported on 05/12/2024) 30 tablet 10   ENTRESTO  97-103 MG TAKE 1 TABLET BY MOUTH 2 TIMES A DAY 180 tablet 3   fluticasone  (FLONASE ) 50 MCG/ACT nasal spray Place 2 sprays into both nostrils daily. 16 g 1   levocetirizine (XYZAL) 5 MG tablet Take 5 mg by mouth daily as needed for allergies.     metFORMIN  (GLUCOPHAGE ) 500 MG tablet TAKE 1 TABLET BY MOUTH 2 TIMES A DAY 180 tablet 0   methylPREDNISolone  (MEDROL ) 4 MG tablet Standard 6 day taper. 21 tablet 0   sertraline  (ZOLOFT ) 100 MG tablet TAKE 1 TABLET BY MOUTH EVERY DAY 90 tablet 0   solifenacin  (VESICARE ) 10 MG tablet Take 1 tablet (10 mg total) by mouth daily. 90 tablet 3   spironolactone  (ALDACTONE ) 25 MG tablet TAKE 1 TABLET BY MOUTH EACH DAY 90 tablet 3   No facility-administered medications prior to visit.    PAST MEDICAL HISTORY: Past Medical History:  Diagnosis Date   Arthritis    Depression    Diabetes mellitus type 2 in obese    Hypertension    Migraines    Sleep apnea    Stroke [facility])     PAST SURGICAL HISTORY: Past Surgical History:  Procedure Laterality Date   CHOLECYSTECTOMY     CORONARY ANGIOGRAPHY N/A 01/07/2022   Procedure: CORONARY ANGIOGRAPHY;  Surgeon: Court Dorn PARAS, MD;  [facility]: [facility];  Service: Cardiovascular;  Laterality: N/A;   CORONARY ARTERY BYPASS GRAFT N/A 01/08/2022   Procedure: CORONARY ARTERY BYPASS GRAFTING (CABG) TIMES THREE USING LEFT INTERNAL MAMMARY ARTERY AND ENDOSCOPICALLY HARVESTED BILATERAL GREATER SAPHENOUS VEINS;  Surgeon: Shyrl Linnie KIDD, MD;  Location: [facility] OR;  Service: Open Heart Surgery;  Laterality: N/A;   ENDOVEIN HARVEST OF GREATER SAPHENOUS VEIN Right 01/08/2022   Procedure: ENDOVEIN HARVEST OF GREATER SAPHENOUS VEIN;  Surgeon: Shyrl Linnie KIDD, MD;  Location: [facility] OR;  Service: Open Heart Surgery;  Laterality: Right;   KNEE ARTHROSCOPY     LOOP RECORDER INSERTION N/A 06/24/2017   Procedure: LOOP RECORDER INSERTION;  Surgeon:  Kelsie Agent, MD;  [facility]: [facility];  Service: Cardiovascular;  Laterality: N/A;   ROTATOR CUFF REPAIR     TEE WITHOUT CARDIOVERSION N/A 06/24/2017   Procedure: TRANSESOPHAGEAL ECHOCARDIOGRAM (TEE) WITH LOOP;  Surgeon: Alveta Aleene PARAS, MD;  Location: [facility] ENDOSCOPY;  Service: Cardiovascular;  Laterality: N/A;   TEE WITHOUT CARDIOVERSION N/A 01/08/2022   Procedure: TRANSESOPHAGEAL ECHOCARDIOGRAM (TEE);  Surgeon: Shyrl Linnie KIDD, MD;  Location: [facility] OR;  Service: Open Heart Surgery;  Laterality: N/A;    FAMILY HISTORY: Family History  Problem Relation Age of Onset  Other Mother        reports in good health   Other Father        was estranged from family, unknown medical history    SOCIAL HISTORY: Social History   Socioeconomic History   Marital status: Married    Spouse name: Jori   Number of children: Not on file   Years of education: Not on file   Highest education level: Associate degree: academic program  Occupational History   Occupation: retired    Comment: Charity fundraiser  Tobacco Use   Smoking status: Former    Types: Cigars    Quit date: 01/17/2022    Years since quitting: 2.4   Smokeless tobacco: Never   Tobacco comments:    chew on cigars its not lit  Substance and Sexual Activity   Alcohol use: No    Alcohol/week: 0.0 standard drinks of alcohol   Drug use: No   Sexual activity: Not Currently  Other Topics Concern   Not on file  Social History Narrative   Not on file   Social Drivers of Health   Financial Resource Strain: Low Risk  (06/08/2024)   Overall Financial Resource Strain (CARDIA)    Difficulty of Paying Living Expenses: Not hard at all  Food Insecurity: No Food Insecurity (06/08/2024)   Hunger Vital Sign    Worried About Running Out of Food in the Last Year: Never true    Ran Out of Food in the Last Year: Never true  Transportation Needs: No Transportation Needs (06/08/2024)   PRAPARE - Administrator, Civil Service (Medical): No     Lack of Transportation (Non-Medical): No  Physical Activity: Inactive (06/08/2024)   Exercise Vital Sign    Days of Exercise per Week: 0 days    Minutes of Exercise per Session: 0 min  Stress: No Stress Concern Present (06/08/2024)   [employer] of Occupational Health - Occupational Stress Questionnaire    Feeling of Stress: Not at all  Social Connections: Moderately Isolated (06/08/2024)   Social Connection and Isolation Panel    Frequency of Communication with Friends and Family: More than three times a week    Frequency of Social Gatherings with Friends and Family: More than three times a week    Attends Religious Services: Never    Database administrator or Organizations: No    Attends Banker Meetings: Never    Marital Status: Married  Catering manager Violence: Not At Risk (06/08/2024)   Humiliation, Afraid, Rape, and Kick questionnaire    Fear of Current or Ex-Partner: No    Emotionally Abused: No    Physically Abused: No    Sexually Abused: No      PHYSICAL EXAM Generalized: Well developed, in no acute distress   Neurological examination  Mentation: Alert oriented to time, place, history taking. Follows all commands speech and language fluent Cranial nerve II-XII:Facial symmetry noted.    DIAGNOSTIC DATA (LABS, IMAGING, TESTING) - I reviewed patient records, labs, notes, testing and imaging myself where available.  Lab Results  Component Value Date   WBC 8.0 10/24/2023   HGB 15.9 10/24/2023   HCT 46.6 10/24/2023   MCV 88.9 10/24/2023   PLT 139.0 (L) 10/24/2023      Component Value Date/Time   NA 139 02/13/2024 1228   NA 144 02/08/2022 1110   K 4.4 02/13/2024 1228   CL 102 02/13/2024 1228   CO2 31 02/13/2024 1228   GLUCOSE 137 (H) 02/13/2024  1228   BUN 17 02/13/2024 1228   BUN 20 02/08/2022 1110   CREATININE 1.13 02/13/2024 1228   CALCIUM  8.8 02/13/2024 1228   PROT 6.2 02/13/2024 1228   PROT 6.1 10/09/2015 0000   PROT 6.0 10/09/2015 0000    ALBUMIN  4.4 02/13/2024 1228   ALBUMIN  4.0 10/09/2015 0000   ALBUMIN  4.2 10/09/2015 0000   AST 15 02/13/2024 1228   ALT 8 02/13/2024 1228   ALKPHOS 34 (L) 02/13/2024 1228   BILITOT 0.9 02/13/2024 1228   BILITOT 0.5 10/09/2015 0000   BILITOT 0.4 10/09/2015 0000   GFRNONAA >60 01/21/2022 1453   GFRAA >60 07/10/2017 1246   Lab Results  Component Value Date   CHOL 106 02/13/2024   HDL 35.80 (L) 02/13/2024   LDLCALC 42 02/13/2024   LDLDIRECT 62.0 08/27/2022   TRIG 143.0 02/13/2024   CHOLHDL 3 02/13/2024   Lab Results  Component Value Date   HGBA1C 6.0 02/13/2024   Lab Results  Component Value Date   VITAMINB12 265 07/11/2017   Lab Results  Component Value Date   TSH 0.643 07/11/2017      ASSESSMENT AND PLAN 73 y.o. year old male  has a past medical history of Arthritis, Depression, Diabetes mellitus type 2 in obese, Hypertension, Migraines, Sleep apnea, and Stroke (HCC). here with:  OSA on CPAP  CPAP compliance excellent Residual AHI is good Encouraged patient to continue using CPAP nightly and > 4 hours each night F/U in 1 year or sooner if needed   Duwaine Russell, MSN, NP-C 06/29/2024, 3:01 PM [facility] [address], [address] [place], [place] [address] [phone]  The patient's condition requires frequent monitoring and adjustments in the treatment plan, reflecting the ongoing complexity of care.  This provider is the continuing focal point for all needed services for this condition.

## 2024-07-21 ENCOUNTER — Other Ambulatory Visit: Payer: Self-pay | Admitting: Cardiovascular Disease

## 2024-08-19 ENCOUNTER — Encounter: Payer: Self-pay | Admitting: Medical

## 2024-08-19 ENCOUNTER — Ambulatory Visit: Payer: Self-pay | Admitting: Medical

## 2024-08-19 ENCOUNTER — Other Ambulatory Visit: Payer: Self-pay | Admitting: Medical

## 2024-08-19 ENCOUNTER — Ambulatory Visit: Admitting: Medical

## 2024-08-19 VITALS — BP 140/80 | HR 56 | Temp 97.7°F | Resp 16 | Ht 65.0 in | Wt 236.6 lb

## 2024-08-19 DIAGNOSIS — E118 Type 2 diabetes mellitus with unspecified complications: Secondary | ICD-10-CM | POA: Diagnosis not present

## 2024-08-19 DIAGNOSIS — E785 Hyperlipidemia, unspecified: Secondary | ICD-10-CM | POA: Diagnosis not present

## 2024-08-19 DIAGNOSIS — I10 Essential (primary) hypertension: Secondary | ICD-10-CM | POA: Diagnosis not present

## 2024-08-19 DIAGNOSIS — I509 Heart failure, unspecified: Secondary | ICD-10-CM

## 2024-08-19 DIAGNOSIS — H6123 Impacted cerumen, bilateral: Secondary | ICD-10-CM | POA: Diagnosis not present

## 2024-08-19 DIAGNOSIS — Z7984 Long term (current) use of oral hypoglycemic drugs: Secondary | ICD-10-CM

## 2024-08-19 LAB — LIPID PANEL
Cholesterol: 125 mg/dL (ref 0–200)
HDL: 41.6 mg/dL (ref >39.00)
LDL Cholesterol: 56 mg/dL (ref 0–99)
NonHDL: 83.88
Total CHOL/HDL Ratio: 3
Triglycerides: 138 mg/dL (ref 0.0–149.0)
VLDL: 27.6 mg/dL (ref 0.0–40.0)

## 2024-08-19 LAB — HEMOGLOBIN A1C: Hgb A1c MFr Bld: 6.5 % (ref 4.6–6.5)

## 2024-08-19 NOTE — Progress Notes (Signed)
 Subjective:    Patient ID: Raymond [name], male    DOB: 1950-12-17, 73 y.o.   MRN: [id number]  HPI Raymond [name] is a 73 year old male who presents with hearing difficulties due to earwax buildup.  He has been experiencing hearing difficulties for about a month, and he attributes these issues to interference with his hearing aids due to wax. . He has previously undergone ear lavage, which was effective in removing significant amounts of wax.  No recent symptoms such as cough, congestion, runny nose, fevers, or a history of ear infections.  He has a history of congestive heart failure and is on a beta blocker for management. His last cardiology visit was in March, where his pulse was noted to be 50 at rest. He monitors his pulse at home, especially during activities, to ensure it does not drop too low. I explained we need separate visit to discuss chronic med problems but since he is here and fasting did go ahead and get some labs.  He has not eaten breakfast today, and it has been six months since his last general checkup.     Review of Systems  Constitutional:  Negative for chills, fatigue and fever.  HENT:  Negative for congestion, ear pain, postnasal drip and tinnitus.        Ears blocked.  Cardiovascular:  Negative for chest pain and palpitations.  Gastrointestinal:  Negative for abdominal pain and diarrhea.  Genitourinary:  Negative for dysuria, frequency and penile discharge.  Neurological:  Negative for facial asymmetry and light-headedness.  Hematological:  Negative for adenopathy.    Past Medical History:  Diagnosis Date   Arthritis    Depression    Diabetes mellitus type 2 in obese    Hypertension    Migraines    Sleep apnea    Stroke [facility])      Social History   Socioeconomic History   Marital status: Married    Spouse name: Jori   Number of children: Not on file   Years of education: Not on file   Highest education level: Associate degree: academic  program  Occupational History   Occupation: retired    Comment: Charity fundraiser  Tobacco Use   Smoking status: Former    Types: Cigars    Quit date: 01/17/2022    Years since quitting: 2.5   Smokeless tobacco: Never   Tobacco comments:    chew on cigars its not lit  Substance and Sexual Activity   Alcohol use: No    Alcohol/week: 0.0 standard drinks of alcohol   Drug use: No   Sexual activity: Not Currently  Other Topics Concern   Not on file  Social History Narrative   Not on file   Social Drivers of Health   Financial Resource Strain: Low Risk  (06/08/2024)   Overall Financial Resource Strain (CARDIA)    Difficulty of Paying Living Expenses: Not hard at all  Food Insecurity: No Food Insecurity (06/08/2024)   Hunger Vital Sign    Worried About Running Out of Food in the Last Year: Never true    Ran Out of Food in the Last Year: Never true  Transportation Needs: No Transportation Needs (06/08/2024)   PRAPARE - Administrator, Civil Service (Medical): No    Lack of Transportation (Non-Medical): No  Physical Activity: Inactive (06/08/2024)   Exercise Vital Sign    Days of Exercise per Week: 0 days    Minutes of Exercise per Session: 0  min  Stress: No Stress Concern Present (06/08/2024)   [employer] of Occupational Health - Occupational Stress Questionnaire    Feeling of Stress: Not at all  Social Connections: Moderately Isolated (06/08/2024)   Social Connection and Isolation Panel    Frequency of Communication with Friends and Family: More than three times a week    Frequency of Social Gatherings with Friends and Family: More than three times a week    Attends Religious Services: Never    Database administrator or Organizations: No    Attends Banker Meetings: Never    Marital Status: Married  Catering manager Violence: Not At Risk (06/08/2024)   Humiliation, Afraid, Rape, and Kick questionnaire    Fear of Current or Ex-Partner: No    Emotionally Abused: No     Physically Abused: No    Sexually Abused: No    Past Surgical History:  Procedure Laterality Date   CHOLECYSTECTOMY     CORONARY ANGIOGRAPHY N/A 01/07/2022   Procedure: CORONARY ANGIOGRAPHY;  Surgeon: Court Dorn PARAS, MD;  [facility];  Service: Cardiovascular;  Laterality: N/A;   CORONARY ARTERY BYPASS GRAFT N/A 01/08/2022   Procedure: CORONARY ARTERY BYPASS GRAFTING (CABG) TIMES THREE USING LEFT INTERNAL MAMMARY ARTERY AND ENDOSCOPICALLY HARVESTED BILATERAL GREATER SAPHENOUS VEINS;  Surgeon: Shyrl Linnie KIDD, MD;  Location: [facility] OR;  Service: Open Heart Surgery;  Laterality: N/A;   ENDOVEIN HARVEST OF GREATER SAPHENOUS VEIN Right 01/08/2022   Procedure: ENDOVEIN HARVEST OF GREATER SAPHENOUS VEIN;  Surgeon: Shyrl Linnie KIDD, MD;  Location: [facility] OR;  Service: Open Heart Surgery;  Laterality: Right;   KNEE ARTHROSCOPY     LOOP RECORDER INSERTION N/A 06/24/2017   Procedure: LOOP RECORDER INSERTION;  Surgeon: Kelsie Agent, MD;  Location: [facility];  Service: Cardiovascular;  Laterality: N/A;   ROTATOR CUFF REPAIR     TEE WITHOUT CARDIOVERSION N/A 06/24/2017   Procedure: TRANSESOPHAGEAL ECHOCARDIOGRAM (TEE) WITH LOOP;  Surgeon: Alveta Aleene PARAS, MD;  Location: [facility] ENDOSCOPY;  Service: Cardiovascular;  Laterality: N/A;   TEE WITHOUT CARDIOVERSION N/A 01/08/2022   Procedure: TRANSESOPHAGEAL ECHOCARDIOGRAM (TEE);  Surgeon: Shyrl Linnie KIDD, MD;  Location: [facility] OR;  Service: Open Heart Surgery;  Laterality: N/A;    Family History  Problem Relation Age of Onset   Other Mother        reports in good health   Other Father        was estranged from family, unknown medical history    Allergies  Allergen Reactions   Flomax [Tamsulosin Hcl] Other (See Comments)    HYPOtension and syncope    Current Outpatient Medications on File Prior to Visit  Medication Sig Dispense Refill   albuterol  (VENTOLIN  HFA) 108 (90 Base) MCG/ACT inhaler Inhale 2 puffs into the lungs every 6  (six) hours as needed. 18 g 0   aspirin  EC 81 MG EC tablet Take 1 tablet (81 mg total) by mouth daily. Swallow whole. 30 tablet 11   atorvastatin  (LIPITOR) 40 MG tablet TAKE 1 TABLET BY MOUTH EVERY DAY AT 6PM FOR CHOLESTEROL 90 tablet 0   benzonatate  (TESSALON ) 100 MG capsule Take 1 capsule (100 mg total) by mouth 3 (three) times daily as needed for cough. 30 capsule 0   budesonide -formoterol  (SYMBICORT ) 160-4.5 MCG/ACT inhaler Inhale 2 puffs into the lungs 2 (two) times daily. 1 each 12   carvedilol  (COREG ) 6.25 MG tablet TAKE 1 TABLET BY MOUTH 2 TIMES A DAY WITH MEALS 90 tablet  3   clopidogrel  (PLAVIX ) 75 MG tablet TAKE 1 TABLET BY MOUTH EVERY DAY 90 tablet 3   ENTRESTO  97-103 MG TAKE 1 TABLET BY MOUTH 2 TIMES A DAY 180 tablet 3   levocetirizine (XYZAL) 5 MG tablet Take 5 mg by mouth daily as needed for allergies.     metFORMIN  (GLUCOPHAGE ) 500 MG tablet TAKE 1 TABLET BY MOUTH 2 TIMES A DAY 180 tablet 0   sertraline  (ZOLOFT ) 100 MG tablet TAKE 1 TABLET BY MOUTH EVERY DAY 90 tablet 0   solifenacin  (VESICARE ) 10 MG tablet Take 1 tablet (10 mg total) by mouth daily. 90 tablet 3   spironolactone  (ALDACTONE ) 25 MG tablet TAKE 1 TABLET BY MOUTH EACH DAY 90 tablet 3   dapagliflozin  propanediol (FARXIGA ) 10 MG TABS tablet Take 1 tablet (10 mg total) by mouth daily before breakfast. (Patient not taking: Reported on 08/19/2024) 30 tablet 10   No current facility-administered medications on file prior to visit.    BP (!) 140/80   Pulse (!) 56   Temp 97.7 F (36.5 C) (Oral)   Resp 16   Ht 5' 5 (1.651 m)   Wt 236 lb 9.6 oz (107.3 kg)   SpO2 98%   BMI 39.37 kg/m        Objective:   Physical Exam  General Mental Status- Alert. General Appearance- Not in acute distress.   Skin General: Color- Normal Color. Moisture- Normal Moisture.  Neck  No JVD.  Chest and Lung Exam Auscultation: Breath Sounds:-CTA  Cardiovascular Auscultation:Rythm- RRR Murmurs & Other Heart Sounds:Auscultation  of the heart reveals- No Murmurs.  Abdomen Inspection:-Inspeection Normal. Palpation/Percussion:Note:No mass. Palpation and Percussion of the abdomen reveal- Non Tender, Non Distended + BS, no rebound or guarding.   Neurologic Cranial Nerve exam:- CN III-XII intact(No nystagmus), symmetric smile. Strength:- 5/5 equal and symmetric strength both upper and lower extremities.       Assessment & Plan:   Patient Instructions  Impacted cerumen, bilateral(counseled on procedure and pt gave verbal consent) Bilateral impacted cerumen with significant wax in the right ear affecting hearing aids but moderate to left side as well. Previous lavage successful. - CMA performed ear lavage to remove impacted cerumen bilaterally.  - Post lavage tm normal and hearing normalized with hearing aids. .  Congestive heart failure - Continue current CHF medications including beta blockers. - Monitor pulse during activity and report if it drops below 60 bpm. - - Follow up with cardiologist if pulse consistently falls into the 40s.  Hypertension Blood pressure at 148/80 mmHg, slightly elevated. - Continue current medications for blood pressure management. - Reassess blood pressure at follow-up visit. -no current chf flare presently.  Hyperlipidemia -continue lipitor and get lipid panel  Diabetes well controlled in past. -continue farxiga  and get a1c  Follow-Up Follow-up needed for comprehensive evaluation and lab work. - Schedule follow-up appointment in one month. - Perform fasting labs including A1c, kidney function, and lipid panel today   Dallas Maxwell, PA-C

## 2024-08-19 NOTE — Patient Instructions (Signed)
 Impacted cerumen, bilateral(counseled on procedure and pt gave verbal consent) Bilateral impacted cerumen with significant wax in the right ear affecting hearing aids but moderate to left side as well. Previous lavage successful. - CMA performed ear lavage to remove impacted cerumen bilaterally.  - Post lavage tm normal and hearing normalized with hearing aids. .  Congestive heart failure - Continue current CHF medications including beta blockers. - Monitor pulse during activity and report if it drops below 60 bpm. - - Follow up with cardiologist if pulse consistently falls into the 40s.  Hypertension Blood pressure at 148/80 mmHg, slightly elevated. - Continue current medications for blood pressure management. - Reassess blood pressure at follow-up visit. -no current chf flare presently.  Hyperlipidemia -continue lipitor and get lipid panel  Diabetes well controlled in past. -continue farxiga  and get a1c  Follow-Up Follow-up needed for comprehensive evaluation and lab work. - Schedule follow-up appointment in one month. - Perform fasting labs including A1c, kidney function, and lipid panel today

## 2024-08-20 LAB — COMPLETE METABOLIC PANEL WITHOUT GFR
AG Ratio: 2.4 (calc) (ref 1.0–2.5)
ALT: 16 U/L (ref 9–46)
AST: 17 U/L (ref 10–35)
Albumin: 4.7 g/dL (ref 3.6–5.1)
Alkaline phosphatase (APISO): 36 U/L (ref 35–144)
BUN: 17 mg/dL (ref 7–25)
CO2: 26 mmol/L (ref 20–32)
Calcium: 8.8 mg/dL (ref 8.6–10.3)
Chloride: 102 mmol/L (ref 98–110)
Creat: 1.19 mg/dL (ref 0.70–1.28)
Globulin: 2 g/dL (ref 1.9–3.7)
Glucose, Bld: 145 mg/dL — ABNORMAL HIGH (ref 65–99)
Potassium: 4.4 mmol/L (ref 3.5–5.3)
Sodium: 138 mmol/L (ref 135–146)
Total Bilirubin: 0.8 mg/dL (ref 0.2–1.2)
Total Protein: 6.7 g/dL (ref 6.1–8.1)

## 2024-08-23 NOTE — Progress Notes (Signed)
 Seen by patient Raymond [name] on 08/22/2024 10:01 AM

## 2024-09-22 ENCOUNTER — Other Ambulatory Visit: Payer: Self-pay | Admitting: Medical

## 2024-09-22 ENCOUNTER — Encounter: Payer: Self-pay | Admitting: Medical

## 2024-09-22 ENCOUNTER — Other Ambulatory Visit (HOSPITAL_COMMUNITY): Payer: Self-pay | Admitting: Cardiovascular Disease

## 2024-09-22 ENCOUNTER — Ambulatory Visit (INDEPENDENT_AMBULATORY_CARE_PROVIDER_SITE_OTHER): Admitting: Medical

## 2024-09-22 VITALS — BP 145/70 | HR 56 | Temp 97.9°F | Resp 16 | Ht 65.0 in | Wt 239.4 lb

## 2024-09-22 DIAGNOSIS — I509 Heart failure, unspecified: Secondary | ICD-10-CM

## 2024-09-22 DIAGNOSIS — Z7984 Long term (current) use of oral hypoglycemic drugs: Secondary | ICD-10-CM

## 2024-09-22 DIAGNOSIS — M25561 Pain in right knee: Secondary | ICD-10-CM

## 2024-09-22 DIAGNOSIS — G8929 Other chronic pain: Secondary | ICD-10-CM

## 2024-09-22 DIAGNOSIS — M25551 Pain in right hip: Secondary | ICD-10-CM | POA: Diagnosis not present

## 2024-09-22 DIAGNOSIS — I10 Essential (primary) hypertension: Secondary | ICD-10-CM

## 2024-09-22 DIAGNOSIS — E119 Type 2 diabetes mellitus without complications: Secondary | ICD-10-CM

## 2024-09-22 NOTE — Progress Notes (Signed)
 Subjective:    Patient ID: Raymond [name], male    DOB: [birth year], 73 y.o.   MRN: [id number]  HPI  Raymond [name] is a 73 year old male with congestive heart failure and osteoarthritis who presents for a parking placard form.  He experiences chronic pain due to osteoarthritis in both hips and the right knee, with the right side being more symptomatic. This significantly limits his mobility, particularly for walking long distances such as in parking lots. He has previously received steroid injections in these areas with some relief. Per pt ortho not recommending any hip or knee replacement surgery.  He has congestive heart failure and is on Entresto , carvedilol , and spironolactone . He experiences no shortness of breath on short walks but no leg swelling. He maintains regular follow-ups with his cardiologist. No swelling of legs. No orthopnea.    Review of Systems  Constitutional:  Negative for chills and fever.  Respiratory:  Negative for chest tightness and shortness of breath.   Cardiovascular:  Negative for chest pain and palpitations.  Gastrointestinal:  Negative for abdominal pain and blood in stool.  Musculoskeletal:  Negative for back pain and neck pain.       Hip pain and knee pain  Skin:  Negative for rash.  Neurological:  Positive for dizziness. Negative for seizures and weakness.  Hematological:  Negative for adenopathy.  Psychiatric/Behavioral:  Positive for suicidal ideas. Negative for behavioral problems.     Past Medical History:  Diagnosis Date   Arthritis    Depression    Diabetes mellitus type 2 in obese    Hypertension    Migraines    Sleep apnea    Stroke [facility])      Social History   Socioeconomic History   Marital status: Married    Spouse name: Jori   Number of children: Not on file   Years of education: Not on file   Highest education level: Associate degree: academic program  Occupational History   Occupation: retired    Comment: [employer]  Tobacco  Use   Smoking status: Former    Types: Cigars    Quit date: 01/17/2022    Years since quitting: 2.6   Smokeless tobacco: Never   Tobacco comments:    chew on cigars its not lit  Substance and Sexual Activity   Alcohol use: No    Alcohol/week: 0.0 standard drinks of alcohol   Drug use: No   Sexual activity: Not Currently  Other Topics Concern   Not on file  Social History Narrative   Not on file   Social Drivers of Health   Financial Resource Strain: Low Risk  (06/08/2024)   Overall Financial Resource Strain (CARDIA)    Difficulty of Paying Living Expenses: Not hard at all  Food Insecurity: No Food Insecurity (06/08/2024)   Hunger Vital Sign    Worried About Running Out of Food in the Last Year: Never true    Ran Out of Food in the Last Year: Never true  Transportation Needs: No Transportation Needs (06/08/2024)   PRAPARE - Administrator, Civil Service (Medical): No    Lack of Transportation (Non-Medical): No  Physical Activity: Inactive (06/08/2024)   Exercise Vital Sign    Days of Exercise per Week: 0 days    Minutes of Exercise per Session: 0 min  Stress: No Stress Concern Present (06/08/2024)   [employer] of Occupational Health - Occupational Stress Questionnaire    Feeling of Stress: Not  at all  Social Connections: Moderately Isolated (06/08/2024)   Social Connection and Isolation Panel    Frequency of Communication with Friends and Family: More than three times a week    Frequency of Social Gatherings with Friends and Family: More than three times a week    Attends Religious Services: Never    Database Administrator or Organizations: No    Attends Banker Meetings: Never    Marital Status: Married  Catering Manager Violence: Not At Risk (06/08/2024)   Humiliation, Afraid, Rape, and Kick questionnaire    Fear of Current or Ex-Partner: No    Emotionally Abused: No    Physically Abused: No    Sexually Abused: No    Past Surgical History:   Procedure Laterality Date   CHOLECYSTECTOMY     CORONARY ANGIOGRAPHY N/A 01/07/2022   Procedure: CORONARY ANGIOGRAPHY;  Surgeon: Court Dorn PARAS, MD;  Location: [facility];  Service: Cardiovascular;  Laterality: N/A;   CORONARY ARTERY BYPASS GRAFT N/A 01/08/2022   Procedure: CORONARY ARTERY BYPASS GRAFTING (CABG) TIMES THREE USING LEFT INTERNAL MAMMARY ARTERY AND ENDOSCOPICALLY HARVESTED BILATERAL GREATER SAPHENOUS VEINS;  Surgeon: Shyrl Linnie KIDD, MD;  Location: [facility] OR;  Service: Open Heart Surgery;  Laterality: N/A;   ENDOVEIN HARVEST OF GREATER SAPHENOUS VEIN Right 01/08/2022   Procedure: ENDOVEIN HARVEST OF GREATER SAPHENOUS VEIN;  Surgeon: Shyrl Linnie KIDD, MD;  Location: [facility] OR;  Service: Open Heart Surgery;  Laterality: Right;   KNEE ARTHROSCOPY     LOOP RECORDER INSERTION N/A 06/24/2017   Procedure: LOOP RECORDER INSERTION;  Surgeon: Kelsie Agent, MD;  Location: [facility];  Service: Cardiovascular;  Laterality: N/A;   ROTATOR CUFF REPAIR     TEE WITHOUT CARDIOVERSION N/A 06/24/2017   Procedure: TRANSESOPHAGEAL ECHOCARDIOGRAM (TEE) WITH LOOP;  Surgeon: Alveta Aleene PARAS, MD;  Location: [facility] ENDOSCOPY;  Service: Cardiovascular;  Laterality: N/A;   TEE WITHOUT CARDIOVERSION N/A 01/08/2022   Procedure: TRANSESOPHAGEAL ECHOCARDIOGRAM (TEE);  Surgeon: Shyrl Linnie KIDD, MD;  Location: [facility] OR;  Service: Open Heart Surgery;  Laterality: N/A;    Family History  Problem Relation Age of Onset   Other Mother        reports in good health   Other Father        was estranged from family, unknown medical history    Allergies  Allergen Reactions   Flomax [Tamsulosin Hcl] Other (See Comments)    HYPOtension and syncope    Current Outpatient Medications on File Prior to Visit  Medication Sig Dispense Refill   albuterol  (VENTOLIN  HFA) 108 (90 Base) MCG/ACT inhaler Inhale 2 puffs into the lungs every 6 (six) hours as needed. 18 g 0   aspirin  EC 81 MG EC tablet Take 1 tablet  (81 mg total) by mouth daily. Swallow whole. 30 tablet 11   atorvastatin  (LIPITOR) 40 MG tablet TAKE 1 TABLET BY MOUTH EVERY DAY AT 6PM FOR CHOLESTEROL 90 tablet 0   benzonatate  (TESSALON ) 100 MG capsule Take 1 capsule (100 mg total) by mouth 3 (three) times daily as needed for cough. 30 capsule 0   budesonide -formoterol  (SYMBICORT ) 160-4.5 MCG/ACT inhaler Inhale 2 puffs into the lungs 2 (two) times daily. 1 each 12   carvedilol  (COREG ) 6.25 MG tablet TAKE 1 TABLET BY MOUTH 2 TIMES A DAY WITH MEALS 90 tablet 3   clopidogrel  (PLAVIX ) 75 MG tablet TAKE 1 TABLET BY MOUTH EVERY DAY 90 tablet 3   fluticasone  (FLONASE ) 50 MCG/ACT nasal spray  Place 2 sprays into both nostrils daily. 16 g 5   levocetirizine (XYZAL) 5 MG tablet Take 5 mg by mouth daily as needed for allergies.     sertraline  (ZOLOFT ) 100 MG tablet TAKE 1 TABLET BY MOUTH EVERY DAY 90 tablet 0   solifenacin  (VESICARE ) 10 MG tablet Take 1 tablet (10 mg total) by mouth daily. 90 tablet 3   spironolactone  (ALDACTONE ) 25 MG tablet TAKE 1 TABLET BY MOUTH EACH DAY 90 tablet 3   dapagliflozin  propanediol (FARXIGA ) 10 MG TABS tablet Take 1 tablet (10 mg total) by mouth daily before breakfast. (Patient not taking: Reported on 09/22/2024) 30 tablet 10   No current facility-administered medications on file prior to visit.    BP (!) 145/70   Pulse (!) 56   Temp 97.9 F (36.6 C) (Oral)   Resp 16   Ht 5' 5 (1.651 m)   Wt 239 lb 6.4 oz (108.6 kg)   SpO2 95%   BMI 39.84 kg/m        Objective:   Physical Exam  General Mental Status- Alert. General Appearance- Not in acute distress.   Skin General: Color- Normal Color. Moisture- Normal Moisture.  Neck  No JVD.  Chest and Lung Exam Auscultation: Breath Sounds:-CTA  Cardiovascular Auscultation:Rythm- RRR Murmurs & Other Heart Sounds:Auscultation of the heart reveals- No Murmurs.  Abdomen Inspection:-Inspeection Normal. Palpation/Percussion:Note:No mass. Palpation and Percussion  of the abdomen reveal- Non Tender, Non Distended + BS, no rebound or guarding.   Neurologic Cranial Nerve exam:- CN III-XII intact(No nystagmus), symmetric smile. Strength:- 5/5 equal and symmetric strength both upper and lower extremities.       Assessment & Plan:   Patient Instructions  Osteoarthritis of right hip and right knee Chronic osteoarthritis in both hips and right knee, with the right hip and knee more symptomatic. Moderate osteoarthritis confirmed by orthopedist. No current surgical intervention planned. - Completed parking placard form for orthopedic condition.  Heart failure Managed with Entresto , carvedilol , and spironolactone . No significant shortness of breath or peripheral edema. Continues follow-up with cardiologist. - Continue current heart failure medications. - Continue follow-up with cardiologist.  Essential hypertension Blood pressure slightly elevated today but generally well-controlled at home. Current medications include carvedilol , spironolactone , and Entresto . - Continue monitoring blood pressure three times a week. - Coordinate with cardiologist for potential medication adjustment if blood pressure trends upwards toward 140/90.  Type 2 diabetes mellitus Managed with Farxiga  and metformin . Due for diabetic eye exam in April 2026. - Continue current diabetes medications. - Schedule diabetic eye exam in April 2026 -diabetic foot exam today  Follow up April 2026 or sooner if needed   Lachina Salsberry, PA-C

## 2024-09-22 NOTE — Patient Instructions (Signed)
 Osteoarthritis of right hip and right knee Chronic osteoarthritis in both hips and right knee, with the right hip and knee more symptomatic. Moderate osteoarthritis confirmed by orthopedist. No current surgical intervention planned. - Completed parking placard form for orthopedic condition.  Heart failure Managed with Entresto , carvedilol , and spironolactone . No significant shortness of breath or peripheral edema. Continues follow-up with cardiologist. - Continue current heart failure medications. - Continue follow-up with cardiologist.  Essential hypertension Blood pressure slightly elevated today but generally well-controlled at home. Current medications include carvedilol , spironolactone , and Entresto . - Continue monitoring blood pressure three times a week. - Coordinate with cardiologist for potential medication adjustment if blood pressure trends upwards toward 140/90.  Type 2 diabetes mellitus Managed with Farxiga  and metformin . Due for diabetic eye exam in April 2026. - Continue current diabetes medications. - Schedule diabetic eye exam in April 2026 -diabetic foot exam today  Follow up April 2026 or sooner if needed

## 2024-10-07 ENCOUNTER — Other Ambulatory Visit (HOSPITAL_COMMUNITY): Payer: Self-pay

## 2024-10-25 ENCOUNTER — Other Ambulatory Visit: Payer: Self-pay | Admitting: Cardiovascular Disease

## 2025-05-11 ENCOUNTER — Ambulatory Visit: Admitting: Urology

## 2025-06-14 ENCOUNTER — Ambulatory Visit

## 2025-06-28 ENCOUNTER — Telehealth: Admitting: Adult Health
